# Patient Record
Sex: Male | Born: 1935
Health system: Southern US, Community
[De-identification: ages and names within clinical notes are randomized; demographics above are authoritative.]

## PROBLEM LIST (undated history)

## (undated) DIAGNOSIS — G4730 Sleep apnea, unspecified: Secondary | ICD-10-CM

## (undated) DIAGNOSIS — E785 Hyperlipidemia, unspecified: Secondary | ICD-10-CM

## (undated) DIAGNOSIS — N400 Enlarged prostate without lower urinary tract symptoms: Secondary | ICD-10-CM

## (undated) DIAGNOSIS — I10 Essential (primary) hypertension: Secondary | ICD-10-CM

## (undated) DIAGNOSIS — Z960 Presence of urogenital implants: Secondary | ICD-10-CM

## (undated) DIAGNOSIS — M5136 Other intervertebral disc degeneration, lumbar region: Secondary | ICD-10-CM

## (undated) DIAGNOSIS — Z978 Presence of other specified devices: Secondary | ICD-10-CM

## (undated) DIAGNOSIS — T884XXA Failed or difficult intubation, initial encounter: Secondary | ICD-10-CM

## (undated) DIAGNOSIS — K219 Gastro-esophageal reflux disease without esophagitis: Secondary | ICD-10-CM

## (undated) DIAGNOSIS — N281 Cyst of kidney, acquired: Secondary | ICD-10-CM

## (undated) DIAGNOSIS — F32A Depression, unspecified: Secondary | ICD-10-CM

## (undated) DIAGNOSIS — D509 Iron deficiency anemia, unspecified: Secondary | ICD-10-CM

## (undated) DIAGNOSIS — Z860100 Personal history of colon polyps, unspecified: Secondary | ICD-10-CM

## (undated) DIAGNOSIS — R7303 Prediabetes: Secondary | ICD-10-CM

## (undated) DIAGNOSIS — F329 Major depressive disorder, single episode, unspecified: Secondary | ICD-10-CM

## (undated) DIAGNOSIS — F419 Anxiety disorder, unspecified: Secondary | ICD-10-CM

## (undated) DIAGNOSIS — Z8719 Personal history of other diseases of the digestive system: Secondary | ICD-10-CM

---
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
## 2000-10-31 DIAGNOSIS — M51369 Other intervertebral disc degeneration, lumbar region without mention of lumbar back pain or lower extremity pain: Secondary | ICD-10-CM

## 2000-10-31 DIAGNOSIS — M5136 Other intervertebral disc degeneration, lumbar region: Secondary | ICD-10-CM

## 2000-10-31 HISTORY — DX: Other intervertebral disc degeneration, lumbar region: M51.36

## 2000-10-31 HISTORY — DX: Other intervertebral disc degeneration, lumbar region without mention of lumbar back pain or lower extremity pain: M51.369

## 2001-10-04 ENCOUNTER — Emergency Department (HOSPITAL_COMMUNITY): Admission: EM | Admit: 2001-10-04 | Discharge: 2001-10-04 | Payer: Self-pay | Admitting: Emergency Medicine

## 2001-10-04 ENCOUNTER — Encounter: Payer: Self-pay | Admitting: Emergency Medicine

## 2003-05-14 ENCOUNTER — Encounter: Payer: Self-pay | Admitting: Internal Medicine

## 2003-06-09 ENCOUNTER — Ambulatory Visit (HOSPITAL_COMMUNITY): Admission: RE | Admit: 2003-06-09 | Discharge: 2003-06-09 | Payer: Self-pay | Admitting: Gastroenterology

## 2003-06-09 ENCOUNTER — Encounter (INDEPENDENT_AMBULATORY_CARE_PROVIDER_SITE_OTHER): Payer: Self-pay | Admitting: Specialist

## 2004-10-13 ENCOUNTER — Ambulatory Visit: Payer: Self-pay | Admitting: Family Medicine

## 2004-11-02 ENCOUNTER — Ambulatory Visit: Payer: Self-pay | Admitting: Internal Medicine

## 2005-01-14 ENCOUNTER — Ambulatory Visit: Payer: Self-pay | Admitting: Internal Medicine

## 2005-01-21 ENCOUNTER — Ambulatory Visit: Payer: Self-pay | Admitting: Internal Medicine

## 2006-03-10 ENCOUNTER — Ambulatory Visit: Payer: Self-pay | Admitting: Internal Medicine

## 2006-03-20 ENCOUNTER — Ambulatory Visit: Payer: Self-pay | Admitting: Internal Medicine

## 2006-03-25 ENCOUNTER — Inpatient Hospital Stay (HOSPITAL_COMMUNITY): Admission: EM | Admit: 2006-03-25 | Discharge: 2006-03-25 | Payer: Self-pay | Admitting: Emergency Medicine

## 2006-03-25 ENCOUNTER — Ambulatory Visit: Payer: Self-pay | Admitting: *Deleted

## 2006-03-29 ENCOUNTER — Ambulatory Visit: Payer: Self-pay | Admitting: Cardiovascular Disease

## 2006-03-31 ENCOUNTER — Ambulatory Visit: Payer: Self-pay

## 2006-03-31 ENCOUNTER — Encounter: Payer: Self-pay | Admitting: Internal Medicine

## 2006-04-12 ENCOUNTER — Ambulatory Visit: Payer: Self-pay | Admitting: Internal Medicine

## 2006-04-19 ENCOUNTER — Ambulatory Visit: Payer: Self-pay | Admitting: Internal Medicine

## 2006-04-26 ENCOUNTER — Inpatient Hospital Stay (HOSPITAL_COMMUNITY): Admission: RE | Admit: 2006-04-26 | Discharge: 2006-04-26 | Payer: Self-pay | Admitting: Internal Medicine

## 2006-04-26 ENCOUNTER — Ambulatory Visit: Payer: Self-pay | Admitting: Internal Medicine

## 2006-06-19 ENCOUNTER — Ambulatory Visit: Payer: Self-pay | Admitting: Internal Medicine

## 2006-06-26 ENCOUNTER — Ambulatory Visit: Payer: Self-pay | Admitting: Internal Medicine

## 2006-08-16 ENCOUNTER — Ambulatory Visit (HOSPITAL_COMMUNITY): Admission: RE | Admit: 2006-08-16 | Discharge: 2006-08-16 | Payer: Self-pay | Admitting: Gastroenterology

## 2006-10-31 DIAGNOSIS — N281 Cyst of kidney, acquired: Secondary | ICD-10-CM

## 2006-10-31 HISTORY — DX: Cyst of kidney, acquired: N28.1

## 2007-01-22 ENCOUNTER — Ambulatory Visit: Payer: Self-pay | Admitting: Family Medicine

## 2007-03-07 ENCOUNTER — Ambulatory Visit: Payer: Self-pay | Admitting: Internal Medicine

## 2007-04-06 ENCOUNTER — Encounter: Admission: RE | Admit: 2007-04-06 | Discharge: 2007-04-06 | Payer: Self-pay | Admitting: Internal Medicine

## 2007-04-23 ENCOUNTER — Encounter: Admission: RE | Admit: 2007-04-23 | Discharge: 2007-06-05 | Payer: Self-pay | Admitting: Internal Medicine

## 2007-06-06 ENCOUNTER — Encounter (INDEPENDENT_AMBULATORY_CARE_PROVIDER_SITE_OTHER): Payer: Self-pay

## 2007-06-06 ENCOUNTER — Ambulatory Visit: Payer: Self-pay | Admitting: Internal Medicine

## 2007-06-06 DIAGNOSIS — M4800 Spinal stenosis, site unspecified: Secondary | ICD-10-CM | POA: Insufficient documentation

## 2007-07-18 ENCOUNTER — Encounter: Payer: Self-pay | Admitting: Internal Medicine

## 2007-07-19 ENCOUNTER — Telehealth (INDEPENDENT_AMBULATORY_CARE_PROVIDER_SITE_OTHER): Payer: Self-pay | Admitting: *Deleted

## 2007-07-25 ENCOUNTER — Encounter: Admission: RE | Admit: 2007-07-25 | Discharge: 2007-07-25 | Payer: Self-pay | Admitting: Internal Medicine

## 2008-02-20 ENCOUNTER — Ambulatory Visit: Payer: Self-pay | Admitting: Internal Medicine

## 2008-02-20 DIAGNOSIS — E785 Hyperlipidemia, unspecified: Secondary | ICD-10-CM | POA: Insufficient documentation

## 2008-02-20 DIAGNOSIS — N400 Enlarged prostate without lower urinary tract symptoms: Secondary | ICD-10-CM | POA: Insufficient documentation

## 2008-02-20 DIAGNOSIS — F528 Other sexual dysfunction not due to a substance or known physiological condition: Secondary | ICD-10-CM | POA: Insufficient documentation

## 2008-02-20 HISTORY — DX: Hyperlipidemia, unspecified: E78.5

## 2008-02-21 LAB — CONVERTED CEMR LAB
ALT: 22 units/L (ref 0–53)
AST: 24 units/L (ref 0–37)
Albumin: 3.7 g/dL (ref 3.5–5.2)
Alkaline Phosphatase: 41 units/L (ref 39–117)
BUN: 17 mg/dL (ref 6–23)
Basophils Absolute: 0 10*3/uL (ref 0.0–0.1)
Basophils Relative: 0 % (ref 0.0–1.0)
Bilirubin, Direct: 0.1 mg/dL (ref 0.0–0.3)
CO2: 28 meq/L (ref 19–32)
Calcium: 8.7 mg/dL (ref 8.4–10.5)
Chloride: 104 meq/L (ref 96–112)
Cholesterol: 194 mg/dL (ref 0–200)
Creatinine, Ser: 0.8 mg/dL (ref 0.4–1.5)
Eosinophils Absolute: 0.3 10*3/uL (ref 0.0–0.7)
Eosinophils Relative: 5.3 % — ABNORMAL HIGH (ref 0.0–5.0)
GFR calc Af Amer: 123 mL/min
GFR calc non Af Amer: 102 mL/min
Glucose, Bld: 89 mg/dL (ref 70–99)
HCT: 42 % (ref 39.0–52.0)
HDL: 71.4 mg/dL (ref >39.0)
Hemoglobin: 13.5 g/dL (ref 13.0–17.0)
LDL Cholesterol: 115 mg/dL — ABNORMAL HIGH (ref 0–99)
Lymphocytes Relative: 31.2 % (ref 12.0–46.0)
MCHC: 32.1 g/dL (ref 30.0–36.0)
MCV: 82.1 fL (ref 78.0–100.0)
Monocytes Absolute: 0.6 10*3/uL (ref 0.1–1.0)
Monocytes Relative: 13.4 % — ABNORMAL HIGH (ref 3.0–12.0)
Neutro Abs: 2.4 10*3/uL (ref 1.4–7.7)
Neutrophils Relative %: 50.1 % (ref 43.0–77.0)
PSA: 2.1 ng/mL (ref 0.10–4.00)
Platelets: 203 10*3/uL (ref 150–400)
Potassium: 4.4 meq/L (ref 3.5–5.1)
RBC: 5.11 M/uL (ref 4.22–5.81)
RDW: 13.5 % (ref 11.5–14.6)
Sodium: 139 meq/L (ref 135–145)
TSH: 1.18 microintl units/mL (ref 0.35–5.50)
Testosterone: 603.37 ng/dL (ref 350.00–890)
Total Bilirubin: 1 mg/dL (ref 0.3–1.2)
Total CHOL/HDL Ratio: 2.7
Total Protein: 6.4 g/dL (ref 6.0–8.3)
Triglycerides: 39 mg/dL (ref 0–149)
VLDL: 8 mg/dL (ref 0–40)
WBC: 4.8 10*3/uL (ref 4.5–10.5)

## 2008-08-12 ENCOUNTER — Encounter: Payer: Self-pay | Admitting: Internal Medicine

## 2009-03-06 ENCOUNTER — Ambulatory Visit: Payer: Self-pay | Admitting: Internal Medicine

## 2009-03-06 LAB — CONVERTED CEMR LAB
Albumin: 3.7 g/dL (ref 3.5–5.2)
BUN: 21 mg/dL (ref 6–23)
Basophils Absolute: 0 10*3/uL (ref 0.0–0.1)
Bilirubin Urine: NEGATIVE
Blood in Urine, dipstick: NEGATIVE
CO2: 28 meq/L (ref 19–32)
Calcium: 8.7 mg/dL (ref 8.4–10.5)
Cholesterol: 185 mg/dL (ref 0–200)
Eosinophils Absolute: 0.3 10*3/uL (ref 0.0–0.7)
Glucose, Bld: 90 mg/dL (ref 70–99)
Glucose, Urine, Semiquant: NEGATIVE
HCT: 39.5 % (ref 39.0–52.0)
HDL: 66 mg/dL (ref >39.00)
Hemoglobin: 13.3 g/dL (ref 13.0–17.0)
Lymphocytes Relative: 32.9 % (ref 12.0–46.0)
Lymphs Abs: 1.7 10*3/uL (ref 0.7–4.0)
MCHC: 33.7 g/dL (ref 30.0–36.0)
Neutro Abs: 2.6 10*3/uL (ref 1.4–7.7)
Nitrite: NEGATIVE
PSA: 2.04 ng/mL (ref 0.10–4.00)
Platelets: 198 10*3/uL (ref 150.0–400.0)
RDW: 13.6 % (ref 11.5–14.6)
Sodium: 141 meq/L (ref 135–145)
Specific Gravity, Urine: 1.02
TSH: 1.07 microintl units/mL (ref 0.35–5.50)
Total Bilirubin: 0.9 mg/dL (ref 0.3–1.2)
Urobilinogen, UA: 0.2
VLDL: 10.4 mg/dL (ref 0.0–40.0)
WBC Urine, dipstick: NEGATIVE
pH: 6

## 2009-03-17 ENCOUNTER — Ambulatory Visit: Payer: Self-pay | Admitting: Internal Medicine

## 2010-03-12 ENCOUNTER — Ambulatory Visit: Payer: Self-pay | Admitting: Internal Medicine

## 2010-03-12 LAB — CONVERTED CEMR LAB
ALT: 22 units/L (ref 0–53)
BUN: 17 mg/dL (ref 6–23)
Bilirubin Urine: NEGATIVE
CO2: 27 meq/L (ref 19–32)
Chloride: 104 meq/L (ref 96–112)
Cholesterol: 187 mg/dL (ref 0–200)
Eosinophils Relative: 7.4 % — ABNORMAL HIGH (ref 0.0–5.0)
Glucose, Bld: 89 mg/dL (ref 70–99)
Glucose, Urine, Semiquant: NEGATIVE
HCT: 39.6 % (ref 39.0–52.0)
LDL Cholesterol: 106 mg/dL — ABNORMAL HIGH (ref 0–99)
Lymphs Abs: 1.5 10*3/uL (ref 0.7–4.0)
MCHC: 33 g/dL (ref 30.0–36.0)
MCV: 81.6 fL (ref 78.0–100.0)
Monocytes Absolute: 0.6 10*3/uL (ref 0.1–1.0)
PSA: 2.15 ng/mL (ref 0.10–4.00)
Platelets: 200 10*3/uL (ref 150.0–400.0)
Potassium: 4.1 meq/L (ref 3.5–5.1)
Specific Gravity, Urine: 1.015
TSH: 2.04 microintl units/mL (ref 0.35–5.50)
Total Bilirubin: 0.8 mg/dL (ref 0.3–1.2)
Total Protein: 6.4 g/dL (ref 6.0–8.3)
WBC: 6 10*3/uL (ref 4.5–10.5)
pH: 6.5

## 2010-03-19 ENCOUNTER — Ambulatory Visit: Payer: Self-pay | Admitting: Internal Medicine

## 2010-06-07 ENCOUNTER — Ambulatory Visit: Payer: Self-pay | Admitting: Internal Medicine

## 2010-09-17 ENCOUNTER — Ambulatory Visit: Payer: Self-pay | Admitting: Internal Medicine

## 2010-09-17 DIAGNOSIS — F341 Dysthymic disorder: Secondary | ICD-10-CM | POA: Insufficient documentation

## 2010-10-31 DIAGNOSIS — Z8719 Personal history of other diseases of the digestive system: Secondary | ICD-10-CM

## 2010-10-31 HISTORY — DX: Personal history of other diseases of the digestive system: Z87.19

## 2010-11-30 NOTE — Assessment & Plan Note (Signed)
Summary: mole removal/njr   Vital Signs:  Patient profile:   75 year old male Pulse rate:   64 / minute Pulse rhythm:   regular Resp:     12 per minute BP sitting:   110 / 68  (left arm) Cuff size:   regular  Vitals Entered By: Gladis Riffle, RN (June 07, 2010 8:29 AM)  Procedure Note Last Tetanus: given (04/10/2003)  Incision & Drainage: Onset of lesion: 3 months Indication: enlarging cyst  Procedure # 1: I & D    Size (in cm): 0.8 x 0.8    Location: face    Comment: incision made into cyst  location--under left eye purulent material removed post-op care discussed keep clean use frequent warm compresses    Instrument used: #10 blade    Anesthesia: 1% lidocaine w/o epinephrine  CC: mole removal under left eye Is Patient Diabetic? No   CC:  mole removal under left eye.  Preventive Screening-Counseling & Management  Alcohol-Tobacco     Smoking Status: quit  Current Medications (verified): 1)  Hytrin 5 Mg Caps (Terazosin Hcl) .... Take 1 Capsule By Mouth Once A Day 2)  Citalopram Hydrobromide 20 Mg  Tabs (Citalopram Hydrobromide) .... Take 1 Tablet By Mouth Once A Day  Allergies (verified): No Known Drug Allergies   Complete Medication List: 1)  Hytrin 5 Mg Caps (Terazosin hcl) .... Take 1 capsule by mouth once a day 2)  Citalopram Hydrobromide 20 Mg Tabs (Citalopram hydrobromide) .... Take 1 tablet by mouth once a day  Other Orders: I&D Abscess, Simple / Single (10060)

## 2010-11-30 NOTE — Assessment & Plan Note (Signed)
Summary: CPX/NJR   Vital Signs:  Patient profile:   75 year old male Height:      66.75 inches Weight:      143 pounds BMI:     22.65 Temp:     97.6 degrees F oral Pulse rate:   64 / minute Pulse rhythm:   regular BP sitting:   102 / 70  (left arm) Cuff size:   regular  Vitals Entered By: Kern Reap CMA Duncan Dull) (Mar 19, 2010 9:21 AM) CC: cpx   CC:  cpx.  Current Problems (verified): 1)  Erectile Dysfunction  (ICD-302.72) 2)  Preventive Health Care  (ICD-V70.0) 3)  Benign Prostatic Hypertrophy  (ICD-600.00) 4)  Hyperlipidemia  (ICD-272.4) 5)  Spinal Stenosis  (ICD-724.00)  Current Medications (verified): 1)  Hytrin 5 Mg Caps (Terazosin Hcl) .... Take 1 Capsule By Mouth Once A Day 2)  Citalopram Hydrobromide 20 Mg  Tabs (Citalopram Hydrobromide) .... Take 1 Tablet By Mouth Once A Day  Allergies: No Known Drug Allergies  Past History:  Past Medical History: Last updated: 02/20/2008 Hyperlipidemia Benign prostatic hypertrophy mood disorder renal cyst---2008 ultrasound  Past Surgical History: Last updated: 02/20/2008 Denies surgical history  Family History: Last updated: 02/20/2008 mother dementia father deceased MI 62 yo 2 sibs with htn Family History Hypertension  Social History: Last updated: 02/20/2008 Married Former Smoker Alcohol use-yes Retired  Risk Factors: Smoking Status: quit (02/20/2008)  Physical Exam  General:  alert and well-developed.   Head:  normocephalic and atraumatic.   Eyes:  pupils equal and pupils round.   Ears:  R ear normal and L ear normal.   Nose:  no external deformity and no external erythema.   Neck:  No deformities, masses, or tenderness noted. Chest Wall:  No deformities, masses, tenderness or gynecomastia noted. Lungs:  normal respiratory effort and no accessory muscle use.   Heart:  normal rate and regular rhythm.   Abdomen:  Bowel sounds positive,abdomen soft and non-tender without masses, organomegaly or  hernias noted. Rectal:  no external abnormalities and normal sphincter tone.   Prostate:  no nodules, no asymmetry, and 4+ enlarged.   Msk:  No deformity or scoliosis noted of thoracic or lumbar spine.   Pulses:  R radial normal and L radial normal.   Neurologic:  cranial nerves II-XII intact and gait normal.     Impression & Recommendations:  Problem # 1:  PREVENTIVE HEALTH CARE (ICD-V70.0) health maint UTD continue excellent helath habits  Problem # 2:  BENIGN PROSTATIC HYPERTROPHY (ICD-600.00) sxs are controlled and stable His updated medication list for this problem includes:    Hytrin 5 Mg Caps (Terazosin hcl) .Marland Kitchen... Take 1 capsule by mouth once a day  Complete Medication List: 1)  Hytrin 5 Mg Caps (Terazosin hcl) .... Take 1 capsule by mouth once a day 2)  Citalopram Hydrobromide 20 Mg Tabs (Citalopram hydrobromide) .... Take 1 tablet by mouth once a day  Patient Instructions: 1)  Please schedule a follow-up appointment in 6 months. 2)  make a second appt for mole removal---liquid nitrogen Prescriptions: CITALOPRAM HYDROBROMIDE 20 MG  TABS (CITALOPRAM HYDROBROMIDE) Take 1 tablet by mouth once a day  #90 x 3   Entered and Authorized by:   Birdie Sons MD   Signed by:   Birdie Sons MD on 03/19/2010   Method used:   Electronically to        [facility]. [phone]* (retail)       [phone]  [place], [place]  [address]       Ph: [phone]       Fax: [phone]   RxID:   [id number] HYTRIN 5 MG CAPS (TERAZOSIN HCL) Take 1 capsule by mouth once a day  #90 x 3   Entered and Authorized by:   Birdie Sons MD   Signed by:   Birdie Sons MD on 03/19/2010   Method used:   Electronically to        [facility]. [phone]* (retail)       [phone]       [place], [place]  [address]       Ph: [phone]       Fax: [phone]   RxID:   [id number]    Immunization  History:  Influenza Immunization History:    Influenza:  historical (07/31/2009)

## 2010-11-30 NOTE — Assessment & Plan Note (Signed)
Summary: 6 month rov/njr [facility] DUE TO ERROR/NJR   Vital Signs:  Patient profile:   75 year old male Weight:      148 pounds Temp:     98.0 degrees F oral Pulse rate:   76 / minute BP sitting:   128 / 64  (left arm) Cuff size:   regular  Vitals Entered By: Alfred Levins, CMA (September 17, 2010 8:55 AM) CC: f/u, renew meds   CC:  f/u and renew meds.  History of Present Illness: patient feels well. Has no complaints. He is here for followup on benign prostatic hypertrophy. He essentially has no complaints. He has nocturia x1.  Mood disorder. He is doing well on citalopram  He had one episode of muscle spasm in his back while he was in [place].  All other systems reviewed and were negative   Current Medications (verified): 1)  Hytrin 5 Mg Caps (Terazosin Hcl) .... Take 1 Capsule By Mouth Once A Day 2)  Citalopram Hydrobromide 20 Mg  Tabs (Citalopram Hydrobromide) .... Take 1 Tablet By Mouth Once A Day  Allergies (verified): No Known Drug Allergies  Past History:  Past Medical History: Last updated: 02/20/2008 Hyperlipidemia Benign prostatic hypertrophy mood disorder renal cyst---2008 ultrasound  Past Surgical History: Last updated: 02/20/2008 Denies surgical history  Family History: Last updated: 02/20/2008 mother dementia father deceased MI 4 yo 2 sibs with htn Family History Hypertension  Social History: Last updated: 02/20/2008 Married Former Smoker Alcohol use-yes Retired  Risk Factors: Smoking Status: quit (06/07/2010)  Physical Exam  General:  well-developed well-nourished male in no acute distress. HEENT exam atraumatic, normocephalic neck supple. Chest clear to auscultation cardiac exam S1-S2 are regular. Extremities no clubbing cyanosis or edema.   Impression & Recommendations:  Problem # 1:  BENIGN PROSTATIC HYPERTROPHY (ICD-600.00)  His updated medication list for this problem includes:    Hytrin 5 Mg Caps (Terazosin hcl) .Marland Kitchen... Take 1  capsule by mouth once a day  Problem # 2:  HYPERLIPIDEMIA (ICD-272.4) no need for f/u will remove problem from list Labs Reviewed: SGOT: 23 (03/12/2010)   SGPT: 22 (03/12/2010)   HDL:69.70 (03/12/2010), 66.00 (03/06/2009)  LDL:106 (03/12/2010), 109 (03/06/2009)  Chol:187 (03/12/2010), 185 (03/06/2009)  Trig:55.0 (03/12/2010), 52.0 (03/06/2009)  Problem # 3:  DYSTHYMIA (ICD-300.4)  continue citalopram  Complete Medication List: 1)  Hytrin 5 Mg Caps (Terazosin hcl) .... Take 1 capsule by mouth once a day 2)  Citalopram Hydrobromide 20 Mg Tabs (Citalopram hydrobromide) .... Take 1 tablet by mouth once a day  Patient Instructions: 1)  6 months Prescriptions: CITALOPRAM HYDROBROMIDE 20 MG  TABS (CITALOPRAM HYDROBROMIDE) Take 1 tablet by mouth once a day  #90 x 3   Entered and Authorized by:   Birdie Sons MD   Signed by:   Birdie Sons MD on 09/17/2010   Method used:   Electronically to        [facility]. [phone]* (retail)       [phone]       [place], [place]  [address]       Ph: [phone]       Fax: [phone]   RxID:   [id number] HYTRIN 5 MG CAPS (TERAZOSIN HCL) Take 1 capsule by mouth once a day  #90 x 3   Entered and Authorized by:   Birdie Sons MD   Signed by:   Birdie Sons MD on 09/17/2010   Method used:  Electronically to        [facility]. [phone]* (retail)       [phone]       [place], [place]  [address]       Ph: [phone]       Fax: [phone]   RxID:   [id number]    Orders Added: 1)  Est. Patient Level III [40347]

## 2010-12-27 ENCOUNTER — Ambulatory Visit (INDEPENDENT_AMBULATORY_CARE_PROVIDER_SITE_OTHER): Payer: 59 | Admitting: Internal Medicine

## 2010-12-27 ENCOUNTER — Encounter: Payer: Self-pay | Admitting: Internal Medicine

## 2010-12-27 DIAGNOSIS — R21 Rash and other nonspecific skin eruption: Secondary | ICD-10-CM | POA: Insufficient documentation

## 2010-12-27 MED ORDER — METHYLPREDNISOLONE (PAK) 4 MG PO TABS: ORAL_TABLET | ORAL | Status: AC

## 2010-12-27 NOTE — Progress Notes (Signed)
  Subjective:    Patient ID: [name], male    DOB: 10/27/1936, 75 y.o.   MRN: [id number]  HPI  Pt presents to clinic for evaluation of rash. Notes ~2 wk h/o intermittent diffuse itching followed by ~ 1 wk duration of erythematous maculopapular rash located lower abdomen, legs and primarily back. States itching is mild and denies associated pain. Does not appear to be spreading/progressing. The last two days has attempted hydrocortisone 2.5% with mild improvement of rash. No oral involvement.  Unaware of any recent chemical exposures however with recent trip to [place] laundry detergent may have been change. No other alleviating or exacerbating factors.  Reviewed PMH, medications and allergies.    Review of Systems  Constitutional: Negative for fever and chills.  Musculoskeletal: Negative for back pain.  Skin: Positive for rash. Negative for pallor and wound.       Objective:   Physical Exam  Constitutional: He appears well-developed and well-nourished. No distress.  HENT:  Head: Normocephalic and atraumatic.  Right Ear: External ear normal.  Left Ear: External ear normal.  Eyes: Conjunctivae are normal. No scleral icterus.  Neurological: He is alert.  Skin: Skin is warm and dry. Rash noted. No abrasion, no bruising, no burn, no ecchymosis, no laceration, no lesion, no petechiae and no purpura noted. Rash is maculopapular. Rash is not macular, not papular, not nodular, not pustular, not vesicular and not urticarial. He is not diaphoretic. No erythema. No pallor.          Areas of rash involvement noted graphically. Rash appears as erythematous MP without confluence, blistering or dermatomal distribution.  Psychiatric: He has a normal mood and affect.          Assessment & Plan:

## 2010-12-27 NOTE — Assessment & Plan Note (Signed)
Suggestive of dermatitis. Given diffuse locations will begin medrol dosepak (has tolerated steroids before without difficulty). Followup if no improvement or worsening.

## 2011-02-01 ENCOUNTER — Other Ambulatory Visit: Payer: Self-pay | Admitting: Gastroenterology

## 2011-02-04 ENCOUNTER — Ambulatory Visit
Admission: RE | Admit: 2011-02-04 | Discharge: 2011-02-04 | Disposition: A | Discharge status: Home / Self Care | Payer: 59 | Source: Ambulatory Visit | Attending: Gastroenterology | Admitting: Gastroenterology

## 2011-03-11 ENCOUNTER — Ambulatory Visit: Payer: Self-pay | Admitting: Internal Medicine

## 2011-03-11 DIAGNOSIS — Z0289 Encounter for other administrative examinations: Secondary | ICD-10-CM

## 2011-03-18 NOTE — Op Note (Signed)
   NAME:  BRANDOL, CORP                          ACCOUNT NO.:  [id number]   MEDICAL RECORD NO.:  [id number]                   PATIENT TYPE:  AMB   LOCATION:  ENDO                                 FACILITY:  [facility]   PHYSICIAN:  Anselmo Rod, M.D.               DATE OF BIRTH:  August 10, 1936   DATE OF PROCEDURE:  06/09/2003  DATE OF DISCHARGE:                                 OPERATIVE REPORT   PROCEDURE:  Colonoscopy with snare polypectomy x1.   ENDOSCOPIST:  Anselmo Rod, M.D.   INSTRUMENT USED:  Olympus video colonoscope.   INDICATIONS FOR PROCEDURE:  Screening colonoscopy being done in a 75-year-  old male, rule out colonic polyps, masses, etc.   PREPROCEDURE PREPARATION:  Informed consent was procured from the patient.  The patient was fasted for eight hours prior to the procedure.  Prepped with  a bottle of magnesium citrate and a gallon of GoLYTELY the night prior to  the procedure.   PREPROCEDURE PHYSICAL EXAMINATION:  VITAL SIGNS: Stable.  NECK:  Supple.  CHEST: Clear to auscultation. S1 and S2 regular. ABDOMEN:  Soft with normal  bowel sounds.   DESCRIPTION OF PROCEDURE:  The patient was placed in the left lateral  decubitus position and sedated with 50 mg of Demerol and 6 mg of Versed  intravenously. Once the patient was adequately sedated and maintained on low  flow oxygen and continuous cardiac monitoring, the Olympus video colonoscope  was advanced from the rectum to the cecum without difficulty.  The patient  had a fairly good prep. A small sessile polyp was snared from the hepatic  flexure.  Small internal hemorrhoids were seen on retroflexion in the  rectum. No other masses, polyps, erosions, ulceration, or diverticula were  present.  Appendiceal orifice and ileocecal valve were clearly visualized  and photographed.   IMPRESSION:  1. Small sessile polyp snared from the hepatic flexure.  2. Small nonbleeding internal hemorrhoids.  3. Normal appearing left  colon, transverse colon, and right colon including     cecum.   RECOMMENDATIONS:  1. Await pathology results.  2.     Avoid nonsteroidals including aspirin for the next two weeks.  3. Repeat colorectal cancer screening depending on pathology results.  4. High fiber diet with liberal fluid intake.                                               Anselmo Rod, M.D.    JNM/MEDQ  D:  06/09/2003  T:  06/09/2003  Job:  [id number]   cc:   Valetta Mole. Swords, M.D. [facility]

## 2011-03-18 NOTE — Discharge Summary (Signed)
[name], [name] NO.:  [id number]   MEDICAL RECORD NO.:  [id number]          PATIENT TYPE:  INP   LOCATION:  2899                         FACILITY:  [facility]   PHYSICIAN:  Maple Mirza, P.A. DATE OF BIRTH:  November 04, 1935   DATE OF ADMISSION:  04/26/2006  DATE OF DISCHARGE:  04/26/2006                                 DISCHARGE SUMMARY   ALLERGIES:  HE HAS NO KNOWN DRUG ALLERGIES, BUT ALLERGY TO SHELLFISH.   PRINCIPAL DIAGNOSES:  1.  Syncope in context of Brugada type 1 electrocardiogram tracing.  2.  Family history of sudden death.  3.  Stress Myoview study, March 31, 2006, normal left ventricular function.      No ischemia.  4.  Electrophysiology study, April 26, 2006, no inducible ventricular      arrhythmias.   SECONDARY DIAGNOSES:  1.  Depression.  2.  Benign prostatic hypertrophy.   PROCEDURE:  April 26, 2006, Electrophysiology study:  No inducible  ventricular arrhythmias, Dr. Sherryl Manges.  This patient will not need  follow up with [facility].   BRIEF HISTORY:  [name] is a 75 year old male.  He has no known history  of coronary artery disease and was seen in the emergency room at the end of  May.  He had a complaint of nausea, vomiting, and syncope.  This happened at  the time that the patient had been at a party.  He had three alcoholic  beverages and had not eaten in quite sometime.  He did not feel inebriated,  however, on leaving the party he felt unwell and his wife had actually  helped him to car.  He remembers nothing after this.   His wife notes that he had a very altered state of consciousness.  She was  able to get him move, with some difficulty, from the car to the bedroom when  he got home.  At times at night, he would be more conscious and lucid then  others.  There was never a complete loss of consciousness.  Dr. Sylvie Farrier is a  friend and neighbor.  He checked the patient's blood pressure, it was 80/45.  The pulse was normal  and regular.  The patient was brought to the emergency  room diaphoretic and pale.  Electrocardiogram demonstrated some concern for  Brugada syndrome and he is referred for an appointment on May 30th with the  cardiologist here at [facility].  At this point, he has seen Dr.  Graciela Husbands.  Dr. Graciela Husbands had a long discussion with the patient and wife regarding  type 1 Brugada.   The patient would be for the patient to have a stress Myoview study and  further testing such as electrophysiology study testing.  There is some  literature and study which show that syncope in the setting of Brugada  electrocardiogram benefits from stress stratifying electrophysiology study.  If the study is positive for ventricular arrhythmias inducible then  implantation of a defibrillator is indicated.  The risks and benefits of the  procedure as well as the details of implantable cardioverter-defibrillator  implantation described to the patient.  Family screening was also discussed  at length and the results of the Myoview which were negative for ischemia,  with ejection fraction of well-preserved were related to the patient.  Patient will present electively June 27th for electrophysiology study.   HOSPITAL COURSE:  Ventricular arrhythmias were not inducible.  The patient  will be discharged home on his prehospitalization medications which include,  Lexapro 10 mg daily and Hytrin 5 mg daily.  There is no follow up with  [facility].   This is a 15 minute preparation.      Maple Mirza, P.A.     GM/MEDQ  D:  04/26/2006  T:  04/26/2006  Job:  [id number]   cc:   Duke Salvia, M.D.  [address]  [address]  [place]  [place] [address]   Valetta Mole. Swords, M.D. [facility]  [address] [place]  [place] [address]   Heide Guile, MD  Fax: [phone]

## 2011-03-18 NOTE — Op Note (Signed)
[name], FRIESEN NO.:  [id number]   MEDICAL RECORD NO.:  [id number]          PATIENT TYPE:  AMB   LOCATION:  ENDO                         FACILITY:  [facility]   PHYSICIAN:  Anselmo Rod, M.D.  DATE OF BIRTH:  [birth year]   DATE OF PROCEDURE:  08/16/2006  DATE OF DISCHARGE:  08/16/2006                                 OPERATIVE REPORT   PROCEDURE:  Screening colonoscopy.   ENDOSCOPIST:  Anselmo Rod, M.D.   INSTRUMENT:  Olympus videocolonoscope.   INDICATIONS FOR PROCEDURE:  A 75 year old [place] male with a history of  guaiac-positive stools and a personal history of adenomatous polyps removed  in the past, undergoing a repeat surveillance colonoscopy to rule out  colonic polyps, masses, etcetera.   PREPROCEDURE PREPARATION:  Informed consent was procured from the patient.  The patient was fasted for 4 hours prior to the procedure and prepped with  20 OsmoPrep pills the night of, and 12 OsmoPrep pills the morning of the  procedure. The risks and benefits of the procedure including a 10% missed  rate of cancer and polyps were discussed with the patient as well.   PREPROCEDURE PHYSICAL:  VITAL SIGNS: The patient with stable vital signs.  NECK:  Supple.  CHEST:  Clear to auscultation.  HEART:  S1, S2 regular.  ABDOMEN:  Soft with normal bowel sounds.   DESCRIPTION OF PROCEDURE:  The patient was placed in the left lateral  decubitus position and sedated with 50 mcg of Fentanyl and 5 mg of Versed  given intravenously in slow incremental doses. Once the patient was  adequately sedated, and maintained on low-flow oxygen, and continuous  cardiac monitoring; the Olympus videocolonoscope was advanced from the  rectum to the cecum.  The appendiceal orifice and ileocecal valve were  clearly visualized and photographed.  The terminal ileum appeared healthy  without lesions.  No masses, polyps, erosions, ulcerations, or diverticula  were seen.  Retroflexion in the  rectum revealed no abnormalities.  The  patient tolerated the procedure well without immediate complications.   IMPRESSION:  1. Normal colonoscopy up to the terminal ileum.  2. No masses, polyps, or diverticula seen.   RECOMMENDATIONS:  1. Repeat guaiacs will be done on an outpatient basis.  2. Repeat colonoscopy in the next 5 years unless the patient develops      abnormal symptoms in which case he should contact the office      immediately for further recommendations.  3. Outpatient follow up as need arises in the future.      Anselmo Rod, M.D.  Electronically Signed     JNM/MEDQ  D:  08/16/2006  T:  08/18/2006  Job:  [id number]   cc:   Valetta Mole. Swords, MD

## 2011-03-18 NOTE — Op Note (Signed)
[name], BOLIO NO.:  [id number]   MEDICAL RECORD NO.:  [id number]          PATIENT TYPE:  INP   LOCATION:  2899                         FACILITY:  [facility]   PHYSICIAN:  Duke Salvia, M.D.  DATE OF BIRTH:  10/28/1936   DATE OF PROCEDURE:  04/26/2006  DATE OF DISCHARGE:                                 OPERATIVE REPORT   PREOPERATIVE DIAGNOSIS:  Type 1 Brugada electrocardiogram.   POSTOPERATIVE DIAGNOSIS:  Type 1 Brugada electrocardiogram.   PROCEDURE:  Invasive electrophysiological study.   Following obtaining informed consent, the patient was brought to the  electrophysiological laboratory and placed on the fluoroscopic table in the  supine position.  After routine prep and drape, cardiac catheterization was  performed with local anesthesia and conscious sedation.  Noninvasive blood  pressure monitoring and transcutaneous oxygenation and saturation monitoring  were performed continuously throughout the procedure.  Following the  procedure, the catheters were removed.  Hemostasis was obtained and the  patient was transferred to the holding area in stable condition.   CATHETER:  A 5-French Quadripolar catheter was inserted in the left femoral  vein to the left ventricular apex and subsequently moved to the high right  atrium.  A 5-French Quadripolar catheter was inserted in the left femoral vein to the  AV junction to measure the His electrogram.   Surface leads 1, aVF and V1 were monitored continuously throughout the  procedure.  Following insertion of the catheter, the stimulation protocol  included:  Incremental atrial pacing.  Incremental ventricular pacing.  Single and double and triple ventricular extra stimuli with a paced cycle  length of 600, 500 and 400 milliseconds.  Single and double atrial extra stimuli with a paced cycle length of 600:350  milliseconds.   RESULTS:  Surface electrocardiogram and intracardiac intervals:  Rhythm:   Sinus; RR interval: 10:34 milliseconds; P/R interval:  143  milliseconds; QRS duration 84 milliseconds; QT interval 443 milliseconds; P-  wave duration 115 milliseconds; pre-excitation: Absent; bundle branch block:  Absent.AH Interval:  132 milliseconds;  AH interval 48 milliseconds.  AV nodal physiology:  Dual antegrade AV nodal physiology was identified with  both discontinuous  conduction as well as P/R intervals long than RR  intervals.  Echo beats however were not seen.  Isoproterenol was not  infused.  The effective refractor period at 600 milliseconds was 350:240.   The VA conduction was dissociated at 600 milliseconds.   VENTRICULAR RESPONSE TO PROGRAM STIMULATION:  The effective refractory period had a pace cycle length of 600 milliseconds  and left ventricular apex was 270 milliseconds.  At 500 milliseconds it was  260 milliseconds.  At 400 milliseconds was 230 milliseconds.   Program ventricular stimulation was taken to refractoriness with minimal  coupling interval of 200 milliseconds for S3 and S4.  There were no  inducible ventricular arrhythmias; occasional isolated extra beats were  seen.   IMPRESSION:  1.  Sinus bradycardia.  2.  Normal atrial function.  3.  Dual antegrade AV nodal physiology without inducible echo beats.  4.  Normal  __________  function.  5.  No accessory pathway.  6.  Normal ventricular response to program stimulation.   SUMMARY AND CONCLUSION:  The results of the electrophysiological testing  would stratify Mr. [name] to low-risk not withstanding his spontaneous type  1 Brugada electrocardiogram.  Notwithstanding the patient's history of  syncope, after discussion with Dr. Sudie Bailey, it was felt this was not a  syncope that would be considered symptomatic as it was unlikely to be  arrhythmic given the prolonged prodrome and the prolonged nature of the  event.  Hence, he was ratified  on the basis of being an asymptomatic  patient and at  this point no further interventions are necessary.           ______________________________  Duke Salvia, M.D.     SCK/MEDQ  D:  04/26/2006  T:  04/26/2006  Job:  [id number]   cc:   Electrophysiology Laboratory

## 2011-08-01 HISTORY — PX: COLONOSCOPY: SHX174

## 2011-08-01 HISTORY — PX: UPPER GASTROINTESTINAL ENDOSCOPY: SHX188

## 2011-09-26 ENCOUNTER — Other Ambulatory Visit: Payer: Self-pay | Admitting: Internal Medicine

## 2011-09-27 ENCOUNTER — Other Ambulatory Visit: Payer: Self-pay | Admitting: Internal Medicine

## 2011-09-27 DIAGNOSIS — G4730 Sleep apnea, unspecified: Secondary | ICD-10-CM

## 2011-10-20 ENCOUNTER — Encounter: Payer: Self-pay | Admitting: Internal Medicine

## 2011-10-20 ENCOUNTER — Ambulatory Visit (INDEPENDENT_AMBULATORY_CARE_PROVIDER_SITE_OTHER): Payer: 59 | Admitting: Internal Medicine

## 2011-10-20 VITALS — BP 102/68 | HR 76 | Temp 97.8°F | Ht 68.0 in | Wt 145.0 lb

## 2011-10-20 DIAGNOSIS — Z0000 Encounter for general adult medical examination without abnormal findings: Secondary | ICD-10-CM

## 2011-10-20 DIAGNOSIS — K219 Gastro-esophageal reflux disease without esophagitis: Secondary | ICD-10-CM

## 2011-10-20 DIAGNOSIS — Z79899 Other long term (current) drug therapy: Secondary | ICD-10-CM

## 2011-10-20 LAB — CBC WITH DIFFERENTIAL/PLATELET
Basophils Absolute: 0 10*3/uL (ref 0.0–0.1)
Eosinophils Absolute: 0.3 10*3/uL (ref 0.0–0.7)
Eosinophils Relative: 6 % — ABNORMAL HIGH (ref 0.0–5.0)
HCT: 38.7 % — ABNORMAL LOW (ref 39.0–52.0)
Lymphs Abs: 1.5 10*3/uL (ref 0.7–4.0)
MCHC: 32.7 g/dL (ref 30.0–36.0)
MCV: 81.7 fl (ref 78.0–100.0)
Monocytes Absolute: 0.7 10*3/uL (ref 0.1–1.0)
Platelets: 206 10*3/uL (ref 150.0–400.0)
RDW: 14.2 % (ref 11.5–14.6)

## 2011-10-20 LAB — BASIC METABOLIC PANEL
CO2: 26 mEq/L (ref 19–32)
Chloride: 105 mEq/L (ref 96–112)
Potassium: 4 mEq/L (ref 3.5–5.1)

## 2011-10-20 LAB — HEPATIC FUNCTION PANEL
ALT: 20 U/L (ref 0–53)
AST: 19 U/L (ref 0–37)
Alkaline Phosphatase: 52 U/L (ref 39–117)
Bilirubin, Direct: 0.1 mg/dL (ref 0.0–0.3)
Total Bilirubin: 0.7 mg/dL (ref 0.3–1.2)

## 2011-10-20 LAB — LIPID PANEL
Cholesterol: 189 mg/dL (ref 0–200)
LDL Cholesterol: 111 mg/dL — ABNORMAL HIGH (ref 0–99)

## 2011-10-20 NOTE — Progress Notes (Signed)
Patient ID: [name], male   DOB: 11/15/1935, 75 y.o.   MRN: [id number] cpx  No past medical history on file.  History   Social History  . Marital Status: Married    Spouse Name: N/A    Number of Children: N/A  . Years of Education: N/A   Occupational History  . Not on file.   Social History Main Topics  . Smoking status: Former Games developer  . Smokeless tobacco: Not on file  . Alcohol Use: Not on file  . Drug Use: Not on file  . Sexually Active: Not on file   Other Topics Concern  . Not on file   Social History Narrative  . No narrative on file    No past surgical history on file.  No family history on file.  No Known Allergies  Current Outpatient Prescriptions on File Prior to Visit  Medication Sig Dispense Refill  . aspirin 81 MG chewable tablet Chew 81 mg by mouth daily.        . citalopram (CELEXA) 20 MG tablet take 1 tablet by mouth once daily  30 tablet  0  . terazosin (HYTRIN) 5 MG capsule take 1 capsule by mouth once daily  30 capsule  0     patient denies chest pain, shortness of breath, orthopnea. Denies lower extremity edema, abdominal pain, change in appetite, change in bowel movements. Patient denies rashes, musculoskeletal complaints. No other specific complaints in a complete review of systems.   BP 102/68  Pulse 76  Temp(Src) 97.8 F (36.6 C) (Oral)  Ht 5\' 8"  (1.727 m)  Wt 145 lb (65.772 kg)  BMI 22.05 kg/m2 Well-developed male in no acute distress. HEENT exam atraumatic, normocephalic, extraocular muscles are intact. Conjunctivae are pink without exudate. Neck is supple without lymphadenopathy, thyromegaly, jugular venous distention. Chest is clear to auscultation without increased work of breathing. Cardiac exam S1-S2 are regular. The PMI is normal. No significant murmurs or gallops. Abdominal exam active bowel sounds, soft, nontender. No abdominal bruits. Extremities no clubbing cyanosis or edema. Peripheral pulses are normal without bruits.  Neurologic exam alert and oriented without any motor or sensory deficits.   A/P--well visit, health maintenance UTD

## 2011-10-20 NOTE — Patient Instructions (Signed)
Call your insurance company and see if they will cover shingles vaccine. If they will, call us and we will give it to you  Call [facility]NT or Dr. Bonnielee Haff septum

## 2011-10-21 LAB — VITAMIN B12: Vitamin B-12: 163 pg/mL — ABNORMAL LOW (ref 211–911)

## 2011-10-21 LAB — IRON: Iron: 95 ug/dL (ref 42–165)

## 2011-10-21 LAB — FERRITIN: Ferritin: 23.7 ng/mL (ref 22.0–322.0)

## 2011-10-27 ENCOUNTER — Other Ambulatory Visit: Payer: Self-pay | Admitting: Internal Medicine

## 2011-11-10 ENCOUNTER — Other Ambulatory Visit: Payer: Self-pay | Admitting: Internal Medicine

## 2011-11-16 ENCOUNTER — Ambulatory Visit (INDEPENDENT_AMBULATORY_CARE_PROVIDER_SITE_OTHER): Payer: 59 | Admitting: Internal Medicine

## 2011-11-16 DIAGNOSIS — E538 Deficiency of other specified B group vitamins: Secondary | ICD-10-CM

## 2011-11-16 MED ORDER — CYANOCOBALAMIN 1000 MCG/ML IJ SOLN
1000.0000 ug | Freq: Once | INTRAMUSCULAR | Status: AC
  Administered 2011-11-16: 1000 ug via INTRAMUSCULAR

## 2011-11-22 ENCOUNTER — Ambulatory Visit (INDEPENDENT_AMBULATORY_CARE_PROVIDER_SITE_OTHER): Payer: 59 | Admitting: Internal Medicine

## 2011-11-22 DIAGNOSIS — E538 Deficiency of other specified B group vitamins: Secondary | ICD-10-CM

## 2011-11-22 DIAGNOSIS — J342 Deviated nasal septum: Secondary | ICD-10-CM

## 2011-11-22 MED ORDER — CYANOCOBALAMIN 1000 MCG/ML IJ SOLN
1000.0000 ug | Freq: Once | INTRAMUSCULAR | Status: AC
  Administered 2011-11-22: 1000 ug via INTRAMUSCULAR

## 2011-11-29 ENCOUNTER — Other Ambulatory Visit: Payer: Self-pay | Admitting: Internal Medicine

## 2011-11-30 ENCOUNTER — Ambulatory Visit (INDEPENDENT_AMBULATORY_CARE_PROVIDER_SITE_OTHER): Payer: 59 | Admitting: Internal Medicine

## 2011-11-30 DIAGNOSIS — E538 Deficiency of other specified B group vitamins: Secondary | ICD-10-CM

## 2011-11-30 MED ORDER — CYANOCOBALAMIN 1000 MCG/ML IJ SOLN
1000.0000 ug | Freq: Once | INTRAMUSCULAR | Status: AC
  Administered 2011-11-30: 1000 ug via INTRAMUSCULAR

## 2011-11-30 MED ORDER — ZOSTER VACCINE LIVE 19400 UNT/0.65ML ~~LOC~~ SOLR: 0.6500 mL | Freq: Once | SUBCUTANEOUS | Status: AC

## 2011-12-12 ENCOUNTER — Ambulatory Visit (HOSPITAL_BASED_OUTPATIENT_CLINIC_OR_DEPARTMENT_OTHER): Admission: RE | Admit: 2011-12-12 | Payer: Medicare Other | Source: Ambulatory Visit | Admitting: Otolaryngology

## 2011-12-12 ENCOUNTER — Encounter (HOSPITAL_BASED_OUTPATIENT_CLINIC_OR_DEPARTMENT_OTHER): Admission: RE | Payer: Self-pay | Source: Ambulatory Visit

## 2011-12-12 ENCOUNTER — Other Ambulatory Visit: Payer: Self-pay | Admitting: *Deleted

## 2011-12-12 SURGERY — SEPTOPLASTY, NOSE, WITH NASAL TURBINATE REDUCTION
Anesthesia: General

## 2011-12-12 NOTE — Telephone Encounter (Signed)
Updated immunizations

## 2011-12-28 ENCOUNTER — Ambulatory Visit: Payer: 59 | Admitting: Internal Medicine

## 2011-12-28 ENCOUNTER — Ambulatory Visit (INDEPENDENT_AMBULATORY_CARE_PROVIDER_SITE_OTHER): Payer: Medicare Other | Admitting: Internal Medicine

## 2011-12-28 DIAGNOSIS — E538 Deficiency of other specified B group vitamins: Secondary | ICD-10-CM

## 2011-12-28 MED ORDER — CYANOCOBALAMIN 1000 MCG/ML IJ SOLN
1000.0000 ug | Freq: Once | INTRAMUSCULAR | Status: AC
  Administered 2011-12-28: 1000 ug via INTRAMUSCULAR

## 2011-12-30 ENCOUNTER — Other Ambulatory Visit: Payer: Self-pay | Admitting: Internal Medicine

## 2012-01-25 ENCOUNTER — Ambulatory Visit: Payer: Medicare Other | Admitting: Internal Medicine

## 2012-01-30 ENCOUNTER — Other Ambulatory Visit: Payer: Self-pay | Admitting: Internal Medicine

## 2012-02-22 ENCOUNTER — Ambulatory Visit: Payer: Medicare Other | Admitting: *Deleted

## 2012-02-24 ENCOUNTER — Ambulatory Visit (INDEPENDENT_AMBULATORY_CARE_PROVIDER_SITE_OTHER): Payer: Medicare Other | Admitting: *Deleted

## 2012-02-24 DIAGNOSIS — E538 Deficiency of other specified B group vitamins: Secondary | ICD-10-CM

## 2012-02-24 MED ORDER — CYANOCOBALAMIN 1000 MCG/ML IJ SOLN
1000.0000 ug | Freq: Once | INTRAMUSCULAR | Status: AC
  Administered 2012-02-24: 1000 ug via INTRAMUSCULAR

## 2012-02-29 ENCOUNTER — Other Ambulatory Visit: Payer: Self-pay | Admitting: Internal Medicine

## 2012-03-28 ENCOUNTER — Ambulatory Visit (INDEPENDENT_AMBULATORY_CARE_PROVIDER_SITE_OTHER): Payer: Medicare Other | Admitting: Internal Medicine

## 2012-03-28 DIAGNOSIS — E538 Deficiency of other specified B group vitamins: Secondary | ICD-10-CM

## 2012-03-28 MED ORDER — CYANOCOBALAMIN 1000 MCG/ML IJ SOLN
1000.0000 ug | Freq: Once | INTRAMUSCULAR | Status: AC
  Administered 2012-03-28: 1000 ug via INTRAMUSCULAR

## 2012-03-29 ENCOUNTER — Other Ambulatory Visit: Payer: Self-pay | Admitting: Internal Medicine

## 2012-04-05 ENCOUNTER — Ambulatory Visit (INDEPENDENT_AMBULATORY_CARE_PROVIDER_SITE_OTHER): Payer: Medicare Other | Admitting: Internal Medicine

## 2012-04-05 ENCOUNTER — Encounter: Payer: Self-pay | Admitting: Internal Medicine

## 2012-04-05 VITALS — BP 112/64 | HR 72 | Temp 98.1°F | Wt 143.0 lb

## 2012-04-05 DIAGNOSIS — G4730 Sleep apnea, unspecified: Secondary | ICD-10-CM

## 2012-04-05 DIAGNOSIS — N400 Enlarged prostate without lower urinary tract symptoms: Secondary | ICD-10-CM

## 2012-04-05 MED ORDER — FINASTERIDE 5 MG PO TABS: 5.0000 mg | ORAL_TABLET | Freq: Every day | ORAL | Status: DC

## 2012-04-05 MED ORDER — HYDROCORTISONE 2.5 % EX CREA: TOPICAL_CREAM | Freq: Two times a day (BID) | CUTANEOUS | Status: DC

## 2012-04-05 NOTE — Assessment & Plan Note (Signed)
Discussed at length Add proscar

## 2012-04-05 NOTE — Progress Notes (Signed)
Nocturia-- 3-4 times. Has had dx of bph for years (terazosin). sxs have worsened over past 6 months.  ENT evaluation---has deviated sxs (considering surgery--- working with [employer]). He admits to snoring and wife is concerned with apneic spells.  Bug bite--- left leg-- improving  No past medical history on file.  History   Social History  . Marital Status: Married    Spouse Name: N/A    Number of Children: N/A  . Years of Education: N/A   Occupational History  . Not on file.   Social History Main Topics  . Smoking status: Former Games developer  . Smokeless tobacco: Not on file  . Alcohol Use: Not on file  . Drug Use: Not on file  . Sexually Active: Not on file   Other Topics Concern  . Not on file   Social History Narrative  . No narrative on file    No past surgical history on file.  No family history on file.  No Known Allergies  Current Outpatient Prescriptions on File Prior to Visit  Medication Sig Dispense Refill  . aspirin 81 MG chewable tablet Chew 81 mg by mouth daily.        . citalopram (CELEXA) 20 MG tablet take 1 tablet by mouth once daily  30 tablet  0  . esomeprazole (NEXIUM) 40 MG capsule Take 40 mg by mouth daily before breakfast.        . terazosin (HYTRIN) 5 MG capsule take 1 capsule by mouth once daily  30 capsule  5     patient denies chest pain, shortness of breath, orthopnea. Denies lower extremity edema, abdominal pain, change in appetite, change in bowel movements. Patient denies rashes, musculoskeletal complaints. No other specific complaints in a complete review of systems.   BP 112/64  Pulse 72  Temp(Src) 98.1 F (36.7 C) (Oral)  Wt 143 lb (64.864 kg)  well-developed well-nourished male in no acute distress. HEENT exam atraumatic, normocephalic, neck supple without jugular venous distention. Chest clear to auscultation cardiac exam S1-S2 are regular. Abdominal exam overweight with bowel sounds, soft and nontender. Extremities no  edema. Neurologic exam is alert with a normal gait.   A/P--? Sleep apnea--- polysomnography

## 2012-05-01 ENCOUNTER — Other Ambulatory Visit: Payer: Self-pay | Admitting: Internal Medicine

## 2012-05-01 ENCOUNTER — Telehealth: Payer: Self-pay | Admitting: Internal Medicine

## 2012-05-01 DIAGNOSIS — N400 Enlarged prostate without lower urinary tract symptoms: Secondary | ICD-10-CM

## 2012-05-01 NOTE — Telephone Encounter (Signed)
Ok per Dr. Swords, referral order placed 

## 2012-05-01 NOTE — Telephone Encounter (Signed)
Patient called stating that he would like to be referred to a Urologist for his prostate issues that are not improving. Please advise.

## 2012-05-07 ENCOUNTER — Ambulatory Visit (INDEPENDENT_AMBULATORY_CARE_PROVIDER_SITE_OTHER): Payer: Medicare Other | Admitting: Internal Medicine

## 2012-05-07 DIAGNOSIS — E538 Deficiency of other specified B group vitamins: Secondary | ICD-10-CM

## 2012-05-07 MED ORDER — CYANOCOBALAMIN 1000 MCG/ML IJ SOLN
1000.0000 ug | Freq: Once | INTRAMUSCULAR | Status: AC
  Administered 2012-05-07: 1000 ug via INTRAMUSCULAR

## 2012-05-08 ENCOUNTER — Ambulatory Visit (HOSPITAL_BASED_OUTPATIENT_CLINIC_OR_DEPARTMENT_OTHER): Payer: Medicare Other | Attending: Internal Medicine | Admitting: General Practice

## 2012-05-08 VITALS — Ht 67.0 in | Wt 140.0 lb

## 2012-05-08 DIAGNOSIS — G4733 Obstructive sleep apnea (adult) (pediatric): Secondary | ICD-10-CM

## 2012-05-08 DIAGNOSIS — G4730 Sleep apnea, unspecified: Secondary | ICD-10-CM

## 2012-05-19 DIAGNOSIS — G4733 Obstructive sleep apnea (adult) (pediatric): Secondary | ICD-10-CM

## 2012-05-20 NOTE — Procedures (Signed)
NAMENDREW, CREASON NO.:  [id number]  MEDICAL RECORD NO.:  [id number]          PATIENT TYPE:  OUT  LOCATION:  SLEEP CENTER                 FACILITY:  [facility]  PHYSICIAN:  Barbaraann Share, MD,FCCPDATE OF BIRTH:  04/28/1938  DATE OF STUDY:  05/08/2012                           NOCTURNAL POLYSOMNOGRAM  REFERRING PHYSICIAN:  DOE-HYUN R YOO  LOCATION:  Sleep Lab.  REFERRING PHYSICIAN:  Barbette Hair. Artist Pais, DO  INDICATIONS OF STUDY: Hypersomnia with sleep apnea.  EPWORTH SCORE:  6.  SLEEP ARCHITECTURE:  The patient had total sleep time of 284 minutes with no slow-wave sleep and only 14 minutes of REM.  Sleep onset latency was normal at 9.5 minutes, and REM onset was very prolonged at 366 minutes.  Sleep efficiency was poor at 68%.  RESPIRATORY DATA:  The patient was found to have 75 apneas as well as 11 obstructive hypopneas, giving him an apnea-hypopnea index of 18 events per hour.  The events were more common in the supine position, and there was loud snoring noted throughout.  The patient did not meet split night protocol secondary to the majority of his events occurring well after 2 a.m.  OXYGEN DATA:  There was O2 desaturation as low as 87% with the patient's obstructive events.  CARDIAC DATA:  No clinically significant arrhythmias were noted.  MOVEMENTS/PARASOMNIA:  The patient had no significant leg jerks or other abnormal behaviors noted.  IMPRESSION/RECOMMENDATION: Mild-to-moderate obstructive sleep apnea/hypopnea syndrome with an AHI of 18 events per hour and transient O2 desaturation as low as 87%.  Treatment for this degree of sleep apnea can include upper airway surgery, dental appliance, and also CPAP. Clinical correlation is suggested.     Barbaraann Share, MD,FCCP Diplomate, American Board of Sleep Medicine    [facility]/MEDQ  D:  05/19/2012 16:37:23  T:  05/20/2012 02:40:25  Job:  [id number]

## 2012-05-30 ENCOUNTER — Other Ambulatory Visit: Payer: Self-pay | Admitting: Internal Medicine

## 2012-06-06 ENCOUNTER — Telehealth: Payer: Self-pay | Admitting: *Deleted

## 2012-06-06 NOTE — Telephone Encounter (Signed)
Pt is wanting sleep study results

## 2012-06-08 ENCOUNTER — Ambulatory Visit (INDEPENDENT_AMBULATORY_CARE_PROVIDER_SITE_OTHER): Payer: Medicare Other | Admitting: Internal Medicine

## 2012-06-08 DIAGNOSIS — E538 Deficiency of other specified B group vitamins: Secondary | ICD-10-CM

## 2012-06-08 NOTE — Telephone Encounter (Signed)
referrral order placed

## 2012-06-08 NOTE — Telephone Encounter (Signed)
Patient does have mild to moderate sleep apnea. It is probably worth trying CPAP. Please order CPAP was started at 8 cm of water. Ask for home health agency to do a titration study at home.

## 2012-06-20 MED ORDER — CYANOCOBALAMIN 1000 MCG/ML IJ SOLN: 1000.0000 ug | Freq: Once | INTRAMUSCULAR | Status: DC

## 2012-06-20 MED ORDER — CYANOCOBALAMIN 1000 MCG/ML IJ SOLN
1000.0000 ug | Freq: Once | INTRAMUSCULAR | Status: AC
  Administered 2012-06-20: 1000 ug via INTRAMUSCULAR

## 2012-07-02 ENCOUNTER — Other Ambulatory Visit: Payer: Self-pay | Admitting: Internal Medicine

## 2012-07-09 ENCOUNTER — Ambulatory Visit (INDEPENDENT_AMBULATORY_CARE_PROVIDER_SITE_OTHER): Payer: Medicare Other | Admitting: Internal Medicine

## 2012-07-09 DIAGNOSIS — E538 Deficiency of other specified B group vitamins: Secondary | ICD-10-CM

## 2012-07-09 DIAGNOSIS — Z23 Encounter for immunization: Secondary | ICD-10-CM

## 2012-07-09 MED ORDER — CYANOCOBALAMIN 1000 MCG/ML IJ SOLN
1000.0000 ug | INTRAMUSCULAR | Status: DC
  Administered 2012-07-09: 1000 ug via INTRAMUSCULAR

## 2012-07-26 ENCOUNTER — Encounter (HOSPITAL_COMMUNITY): Payer: Self-pay | Admitting: Pharmacy Technician

## 2012-07-27 ENCOUNTER — Encounter (HOSPITAL_COMMUNITY)
Admission: RE | Admit: 2012-07-27 | Discharge: 2012-07-27 | Disposition: A | Discharge status: Home / Self Care | Payer: Medicare Other | Source: Ambulatory Visit | Attending: Otolaryngology | Admitting: Otolaryngology

## 2012-07-27 ENCOUNTER — Encounter (HOSPITAL_COMMUNITY): Payer: Self-pay

## 2012-07-27 HISTORY — DX: Gastro-esophageal reflux disease without esophagitis: K21.9

## 2012-07-27 HISTORY — DX: Benign prostatic hyperplasia without lower urinary tract symptoms: N40.0

## 2012-07-27 HISTORY — DX: Sleep apnea, unspecified: G47.30

## 2012-07-27 HISTORY — DX: Anxiety disorder, unspecified: F41.9

## 2012-07-27 LAB — BASIC METABOLIC PANEL
BUN: 20 mg/dL (ref 6–23)
CO2: 25 mEq/L (ref 19–32)
Calcium: 8.8 mg/dL (ref 8.4–10.5)
Creatinine, Ser: 0.82 mg/dL (ref 0.50–1.35)
Glucose, Bld: 147 mg/dL — ABNORMAL HIGH (ref 70–99)

## 2012-07-27 LAB — CBC
MCH: 25.8 pg — ABNORMAL LOW (ref 26.0–34.0)
MCHC: 33.2 g/dL (ref 30.0–36.0)
MCV: 77.7 fL — ABNORMAL LOW (ref 78.0–100.0)
Platelets: 225 10*3/uL (ref 150–400)
RDW: 13.9 % (ref 11.5–15.5)
WBC: 4.8 10*3/uL (ref 4.0–10.5)

## 2012-07-27 LAB — SURGICAL PCR SCREEN: Staphylococcus aureus: NEGATIVE

## 2012-07-27 NOTE — Pre-Procedure Instructions (Signed)
20 [name]  07/27/2012   Your procedure is scheduled on:  August 03, 2012  Report to [facility] Short Stay Center at 7:00 AM.  Call this number if you have problems the morning of surgery: [phone]   Remember:   Do not eat food:After Midnight.  .  Take these medicines the morning of surgery with A SIP OF WATER: nexium, hytrin          STOP ASPIRIN   Do not wear jewelry, make-up or nail polish.  Do not wear lotions, powders, or perfumes. You may wear deodorant.  Do not shave 48 hours prior to surgery. Men may shave face and neck.  Do not bring valuables to the hospital.  Contacts, dentures or bridgework may not be worn into surgery.  Leave suitcase in the car. After surgery it may be brought to your room.  For patients admitted to the hospital, checkout time is 11:00 AM the day of discharge.   Patients discharged the day of surgery will not be allowed to drive home.  Name and phone number of your driver:   Special Instructions: Shower using CHG 2 nights before surgery and the night before surgery.  If you shower the day of surgery use CHG.  Use special wash - you have one bottle of CHG for all showers.  You should use approximately 1/3 of the bottle for each shower.   Please read over the following fact sheets that you were given: Pain Booklet, Coughing and Deep Breathing and Surgical Site Infection Prevention

## 2012-07-27 NOTE — H&P (Signed)
  Assessment  . Nasal passage blockage   (478.19) . Deviated nasal septum (acquired)   (470) Discussed  Severe nasal septal deviation with symptomatic obstruction. We discussed the only possible help for this would be an operation. He will think about this and decide if he is interested. Reason For Visit  [name] is here today at the kind request of Swords, Smitty Cords for consultation and opinion. HPI  Significant nasal obstruction his entire life. He has a known deviated septum. He doesnt have any definite history of trauma. His father and his own son has had surgery for this. Allergies  No Known Drug Allergies. Current Meds  Citalopram Hydrobromide 20 MG Oral Tablet;; RPT NexIUM 40 MG Oral Capsule Delayed Release;; RPT Terazosin HCl 5 MG Oral Capsule;; RPT. Active Problems  Heartburn (Symptom) (787.1). Personal Hx  Alcohol Use Marital History - Currently Married Never A Smoker. ROS  12 system ROS was obtained and reviewed on the Health Maintenance form dated today.  Positive responses are shown above.  If the symptom is not checked, the patient has denied it. Vital Signs   Recorded by [facility] on 01 Dec 2011 03:10 PM BP:110/70,  HR: 65 b/min,  Height: 67.000000 in, Weight: 161.096045 lb, BMI: 22.6 kg/m2,  BSA Calculated: 1.76 ,  BMI Calculated: 22.60. Physical Exam  APPEARANCE: Well developed, well nourished, in no acute distress.  Normal affect, in a pleasant mood.  Oriented to time, place and person. COMMUNICATION: Normal voice   HEAD & FACE:  No scars, lesions or masses of head and face.  Sinuses nontender to palpation.  Salivary glands without mass or tenderness.  Facial strength symmetric.  No facial lesion, scars, or mass. EYES: EOMI with normal primary gaze alignment. Visual acuity grossly intact.  PERRLA EXTERNAL EAR & NOSE: No scars, lesions or masses  EAC & TYMPANIC MEMBRANE:  EAC shows no obstructing lesions or debris and tympanic membranes are normal  bilaterally with good movement to insufflation. GROSS HEARING: Normal   TMJ:  Nontender  INTRANASAL EXAM: No polyps or purulence. Severe rightward deflection of the quadrangular cartilage. It appears to be off the maxillary crest completely and separated from the posterior bone. This causes near complete obstruction on the right and significant obstruction on the left. NASOPHARYNX: Normal, without lesions. LIPS, TEETH & GUMS: No lip lesions, normal dentition and normal gums. ORAL CAVITY/OROPHARYNX:  Oral mucosa moist without lesion or asymmetry of the palate, tongue, tonsil or posterior pharynx. NECK:  Supple without adenopathy or mass. THYROID:  Normal with no masses palpable.  NEUROLOGIC:  No gross CN deficits. No nystagmus noted.   LYMPHATIC:  No enlarged nodes palpable. Signature  Electronically signed by : Serena Colonel  M.D.; 12/01/2011 3:29 PM EST.

## 2012-07-30 ENCOUNTER — Encounter: Payer: Self-pay | Admitting: Internal Medicine

## 2012-08-01 ENCOUNTER — Other Ambulatory Visit: Payer: Self-pay | Admitting: Internal Medicine

## 2012-08-02 ENCOUNTER — Other Ambulatory Visit: Payer: Self-pay | Admitting: Family Medicine

## 2012-08-02 MED ORDER — CEFAZOLIN SODIUM-DEXTROSE 2-3 GM-% IV SOLR
2.0000 g | INTRAVENOUS | Status: AC
  Administered 2012-08-03: 2 g via INTRAVENOUS
  Filled 2012-08-02: qty 50

## 2012-08-02 MED ORDER — TERAZOSIN HCL 5 MG PO CAPS: 5.0000 mg | ORAL_CAPSULE | Freq: Every day | ORAL | Status: DC

## 2012-08-03 ENCOUNTER — Encounter (HOSPITAL_COMMUNITY)
Admission: RE | Disposition: A | Discharge status: Home / Self Care | Payer: Self-pay | Source: Ambulatory Visit | Attending: Otolaryngology

## 2012-08-03 ENCOUNTER — Ambulatory Visit (HOSPITAL_COMMUNITY)
Admission: RE | Admit: 2012-08-03 | Discharge: 2012-08-03 | Disposition: A | Discharge status: Home / Self Care | Payer: Medicare Other | Source: Ambulatory Visit | Attending: Otolaryngology | Admitting: Otolaryngology

## 2012-08-03 ENCOUNTER — Ambulatory Visit (HOSPITAL_COMMUNITY): Payer: Medicare Other | Admitting: Anesthesiology

## 2012-08-03 ENCOUNTER — Encounter (HOSPITAL_COMMUNITY): Payer: Self-pay | Admitting: Anesthesiology

## 2012-08-03 ENCOUNTER — Encounter (HOSPITAL_COMMUNITY): Payer: Self-pay | Admitting: *Deleted

## 2012-08-03 DIAGNOSIS — Z01810 Encounter for preprocedural cardiovascular examination: Secondary | ICD-10-CM | POA: Insufficient documentation

## 2012-08-03 DIAGNOSIS — Z01812 Encounter for preprocedural laboratory examination: Secondary | ICD-10-CM | POA: Insufficient documentation

## 2012-08-03 DIAGNOSIS — J343 Hypertrophy of nasal turbinates: Secondary | ICD-10-CM | POA: Insufficient documentation

## 2012-08-03 DIAGNOSIS — Z01818 Encounter for other preprocedural examination: Secondary | ICD-10-CM | POA: Insufficient documentation

## 2012-08-03 DIAGNOSIS — J342 Deviated nasal septum: Secondary | ICD-10-CM

## 2012-08-03 HISTORY — PX: NASAL SEPTOPLASTY W/ TURBINOPLASTY: SHX2070

## 2012-08-03 SURGERY — SEPTOPLASTY, NOSE, WITH NASAL TURBINATE REDUCTION
Anesthesia: General | Site: Face | Laterality: Bilateral | Wound class: Clean Contaminated

## 2012-08-03 MED ORDER — LACTATED RINGERS IV SOLN
INTRAVENOUS | Status: DC
  Administered 2012-08-03: 08:00:00 via INTRAVENOUS

## 2012-08-03 MED ORDER — LIDOCAINE-EPINEPHRINE 1 %-1:100000 IJ SOLN
INTRAMUSCULAR | Status: AC
  Filled 2012-08-03: qty 1

## 2012-08-03 MED ORDER — FENTANYL CITRATE 0.05 MG/ML IJ SOLN
INTRAMUSCULAR | Status: DC | PRN
  Administered 2012-08-03: 100 ug via INTRAVENOUS
  Administered 2012-08-03 (×2): 50 ug via INTRAVENOUS

## 2012-08-03 MED ORDER — DEXAMETHASONE SODIUM PHOSPHATE 4 MG/ML IJ SOLN
INTRAMUSCULAR | Status: DC | PRN
  Administered 2012-08-03: 8 mg via INTRAVENOUS

## 2012-08-03 MED ORDER — LIDOCAINE HCL (CARDIAC) 20 MG/ML IV SOLN
INTRAVENOUS | Status: DC | PRN
  Administered 2012-08-03: 100 mg via INTRAVENOUS

## 2012-08-03 MED ORDER — MIDAZOLAM HCL 5 MG/5ML IJ SOLN
INTRAMUSCULAR | Status: DC | PRN
  Administered 2012-08-03 (×2): 1 mg via INTRAVENOUS

## 2012-08-03 MED ORDER — OXYMETAZOLINE HCL 0.05 % NA SOLN
NASAL | Status: DC | PRN
  Administered 2012-08-03: 1 via NASAL

## 2012-08-03 MED ORDER — HYDROCODONE-ACETAMINOPHEN 7.5-500 MG PO TABS: 1.0000 | ORAL_TABLET | Freq: Four times a day (QID) | ORAL | Status: DC | PRN

## 2012-08-03 MED ORDER — GLYCOPYRROLATE 0.2 MG/ML IJ SOLN
INTRAMUSCULAR | Status: DC | PRN
  Administered 2012-08-03: 0.2 mg via INTRAVENOUS

## 2012-08-03 MED ORDER — PHENYLEPHRINE HCL 10 MG/ML IJ SOLN
INTRAMUSCULAR | Status: DC | PRN
  Administered 2012-08-03 (×4): 80 ug via INTRAVENOUS

## 2012-08-03 MED ORDER — OXYCODONE HCL 5 MG/5ML PO SOLN: 5.0000 mg | Freq: Once | ORAL | Status: DC | PRN

## 2012-08-03 MED ORDER — PROPOFOL 10 MG/ML IV BOLUS
INTRAVENOUS | Status: DC | PRN
  Administered 2012-08-03: 200 mg via INTRAVENOUS

## 2012-08-03 MED ORDER — HYDROMORPHONE HCL PF 1 MG/ML IJ SOLN: 0.2500 mg | INTRAMUSCULAR | Status: DC | PRN

## 2012-08-03 MED ORDER — METOCLOPRAMIDE HCL 5 MG/ML IJ SOLN: 10.0000 mg | Freq: Once | INTRAMUSCULAR | Status: DC | PRN

## 2012-08-03 MED ORDER — OXYCODONE HCL 5 MG PO TABS: 5.0000 mg | ORAL_TABLET | Freq: Once | ORAL | Status: DC | PRN

## 2012-08-03 MED ORDER — BACITRACIN ZINC 500 UNIT/GM EX OINT
TOPICAL_OINTMENT | CUTANEOUS | Status: DC | PRN
  Administered 2012-08-03: 1 via TOPICAL

## 2012-08-03 MED ORDER — LIDOCAINE-EPINEPHRINE 1 %-1:100000 IJ SOLN
INTRAMUSCULAR | Status: DC | PRN
  Administered 2012-08-03: 20 mL

## 2012-08-03 MED ORDER — PROMETHAZINE HCL 25 MG RE SUPP: 25.0000 mg | Freq: Four times a day (QID) | RECTAL | Status: DC | PRN

## 2012-08-03 MED ORDER — LACTATED RINGERS IV SOLN
INTRAVENOUS | Status: DC | PRN
  Administered 2012-08-03 (×2): via INTRAVENOUS

## 2012-08-03 MED ORDER — CEPHALEXIN 500 MG PO CAPS: 500.0000 mg | ORAL_CAPSULE | Freq: Three times a day (TID) | ORAL | Status: DC

## 2012-08-03 MED ORDER — OXYMETAZOLINE HCL 0.05 % NA SOLN
NASAL | Status: AC
  Filled 2012-08-03: qty 15

## 2012-08-03 MED ORDER — SUCCINYLCHOLINE CHLORIDE 20 MG/ML IJ SOLN
INTRAMUSCULAR | Status: DC | PRN
  Administered 2012-08-03: 100 mg via INTRAVENOUS

## 2012-08-03 MED ORDER — BACITRACIN ZINC 500 UNIT/GM EX OINT
TOPICAL_OINTMENT | CUTANEOUS | Status: AC
  Filled 2012-08-03: qty 15

## 2012-08-03 MED ORDER — ONDANSETRON HCL 4 MG/2ML IJ SOLN
INTRAMUSCULAR | Status: DC | PRN
  Administered 2012-08-03: 4 mg via INTRAVENOUS

## 2012-08-03 SURGICAL SUPPLY — 31 items
.030 SILASTIC SHEETING ×1 IMPLANT
ATTRACTOMAT 16X20 MAGNETIC DRP (DRAPES) IMPLANT
CANISTER SUCTION 2500CC (MISCELLANEOUS) ×2 IMPLANT
CLOTH BEACON ORANGE TIMEOUT ST (SAFETY) ×2 IMPLANT
COAGULATOR SUCT SWTCH 10FR 6 (ELECTROSURGICAL) IMPLANT
CRADLE DONUT ADULT HEAD (MISCELLANEOUS) ×2 IMPLANT
DECANTER SPIKE VIAL GLASS SM (MISCELLANEOUS) ×1 IMPLANT
DRESSING TELFA 8X3 (GAUZE/BANDAGES/DRESSINGS) ×2 IMPLANT
GAUZE SPONGE 2X2 8PLY STRL LF (GAUZE/BANDAGES/DRESSINGS) ×1 IMPLANT
GLOVE BIOGEL PI IND STRL 7.0 (GLOVE) IMPLANT
GLOVE BIOGEL PI INDICATOR 7.0 (GLOVE) ×1
GLOVE ECLIPSE 7.5 STRL STRAW (GLOVE) ×2 IMPLANT
GLOVE SURG SS PI 7.0 STRL IVOR (GLOVE) ×1 IMPLANT
GOWN STRL NON-REIN LRG LVL3 (GOWN DISPOSABLE) ×4 IMPLANT
KIT BASIN OR (CUSTOM PROCEDURE TRAY) ×2 IMPLANT
KIT ROOM TURNOVER OR (KITS) ×2 IMPLANT
NEEDLE 27GAX1X1/2 (NEEDLE) ×2 IMPLANT
NS IRRIG 1000ML POUR BTL (IV SOLUTION) ×2 IMPLANT
PAD ARMBOARD 7.5X6 YLW CONV (MISCELLANEOUS) ×4 IMPLANT
PATTIES SURGICAL .5 X3 (DISPOSABLE) ×2 IMPLANT
SPONGE GAUZE 2X2 STER 10/PKG (GAUZE/BANDAGES/DRESSINGS) ×1
SUT CHROMIC 4 0 P 3 18 (SUTURE) ×2 IMPLANT
SUT ETHILON 3 0 PS 1 (SUTURE) ×2 IMPLANT
SUT ETHILON 4 0 CL P 3 (SUTURE) ×1 IMPLANT
SUT PLAIN 4 0 ~~LOC~~ 1 (SUTURE) ×2 IMPLANT
TAPE SURG TRANSPORE 1 IN (GAUZE/BANDAGES/DRESSINGS) ×1 IMPLANT
TAPE SURGICAL TRANSPORE 1 IN (GAUZE/BANDAGES/DRESSINGS) ×1
TOWEL OR 17X24 6PK STRL BLUE (TOWEL DISPOSABLE) ×2 IMPLANT
TOWEL OR 17X26 10 PK STRL BLUE (TOWEL DISPOSABLE) ×2 IMPLANT
TRAY ENT MC OR (CUSTOM PROCEDURE TRAY) ×2 IMPLANT
WATER STERILE IRR 1000ML POUR (IV SOLUTION) ×1 IMPLANT

## 2012-08-03 NOTE — Anesthesia Postprocedure Evaluation (Signed)
Anesthesia Post Note  Patient: [name]  Procedure(s) Performed: Procedure(s) (LRB): NASAL SEPTOPLASTY WITH TURBINATE REDUCTION (Bilateral)  Anesthesia type: General  Patient location: PACU  Post pain: Pain level controlled  Post assessment: Patient's Cardiovascular Status Stable  Last Vitals:  Filed Vitals:   08/03/12 1045  BP:   Pulse: 90  Temp:   Resp: 17    Post vital signs: Reviewed and stable  Level of consciousness: alert  Complications: No apparent anesthesia complications

## 2012-08-03 NOTE — Anesthesia Procedure Notes (Signed)
Procedure Name: Intubation Date/Time: 08/03/2012 9:05 AM Performed by: Orlinda Blalock, Lavere Stork L Pre-anesthesia Checklist: Patient identified, Emergency Drugs available, Suction available, Patient being monitored and Timeout performed Patient Re-evaluated:Patient Re-evaluated prior to inductionOxygen Delivery Method: Circle system utilized Preoxygenation: Pre-oxygenation with 100% oxygen Intubation Type: IV induction and Cricoid Pressure applied Ventilation: Mask ventilation without difficulty Tube type: Oral Tube size: 7.5 mm Number of attempts: 1 Airway Equipment and Method: Video-laryngoscopy Placement Confirmation: ETT inserted through vocal cords under direct vision,  positive ETCO2,  CO2 detector and breath sounds checked- equal and bilateral Secured at: 23 cm Tube secured with: Tape Dental Injury: Teeth and Oropharynx as per pre-operative assessment  Difficulty Due To: Difficulty was anticipated and Difficult Airway- due to anterior larynx Future Recommendations: Recommend- induction with short-acting agent, and alternative techniques readily available

## 2012-08-03 NOTE — Anesthesia Preprocedure Evaluation (Addendum)
Anesthesia Evaluation  Patient identified by MRN, date of birth, ID band Patient awake    Reviewed: Allergy & Precautions, H&P , NPO status , Patient's Chart, lab work & pertinent test results  History of Anesthesia Complications Negative for: history of anesthetic complications  Airway       Dental   Pulmonary sleep apnea and Continuous Positive Airway Pressure Ventilation ,          Cardiovascular negative cardio ROS      Neuro/Psych PSYCHIATRIC DISORDERS Anxiety Depression negative neurological ROS     GI/Hepatic Neg liver ROS, GERD-  Medicated and Controlled,  Endo/Other    Renal/GU negative Renal ROS   BPH    Musculoskeletal negative musculoskeletal ROS (+)   Abdominal   Peds  Hematology negative hematology ROS (+)   Anesthesia Other Findings   Reproductive/Obstetrics                           Anesthesia Physical Anesthesia Plan  ASA: II  Anesthesia Plan: General   Post-op Pain Management:    Induction: Intravenous  Airway Management Planned: Oral ETT  Additional Equipment:   Intra-op Plan:   Post-operative Plan: Extubation in OR  Informed Consent: I have reviewed the patients History and Physical, chart, labs and discussed the procedure including the risks, benefits and alternatives for the proposed anesthesia with the patient or authorized representative who has indicated his/her understanding and acceptance.   Dental advisory given  Plan Discussed with: CRNA and Anesthesiologist  Anesthesia Plan Comments:        Anesthesia Quick Evaluation

## 2012-08-03 NOTE — Transfer of Care (Signed)
Immediate Anesthesia Transfer of Care Note  Patient: [name]  Procedure(s) Performed: Procedure(s) (LRB) with comments: NASAL SEPTOPLASTY WITH TURBINATE REDUCTION (Bilateral)  Patient Location: PACU  Anesthesia Type: General  Level of Consciousness: awake, alert , oriented and patient cooperative  Airway & Oxygen Therapy: Patient Spontanous Breathing  Post-op Assessment: Report given to PACU RN, Post -op Vital signs reviewed and stable and Patient moving all extremities  Post vital signs: Reviewed and stable  Complications: No apparent anesthesia complications

## 2012-08-03 NOTE — Interval H&P Note (Signed)
History and Physical Interval Note:  08/03/2012 8:27 AM  [name]  has presented today for surgery, with the diagnosis of deviated nasal septum turbinate hyperthrophy  The various methods of treatment have been discussed with the patient and family. After consideration of risks, benefits and other options for treatment, the patient has consented to  Procedure(s) (LRB) with comments: NASAL SEPTOPLASTY WITH TURBINATE REDUCTION (Bilateral) as a surgical intervention .  The patient's history has been reviewed, patient examined, no change in status, stable for surgery.  I have reviewed the patient's chart and labs.  Questions were answered to the patient's satisfaction.     Alishah Schulte

## 2012-08-03 NOTE — Op Note (Signed)
OPERATIVE REPORT  DATE OF SURGERY: 08/03/2012  PATIENT:  Worthy Rancher,  76 y.o. male  PRE-OPERATIVE DIAGNOSIS:  deviated nasal septum turbinate hyperthrophy  POST-OPERATIVE DIAGNOSIS:  * No post-op diagnosis entered *  PROCEDURE:  Procedure(s): NASAL SEPTOPLASTY WITH TURBINATE REDUCTION  SURGEON:  Susy Frizzle, MD  ASSISTANTS: none   ANESTHESIA:   general  EBL:  15 ml  DRAINS: none   LOCAL MEDICATIONS USED:  LIDOCAINE   SPECIMEN:  No Specimen  COUNTS:  YES  PROCEDURE DETAILS: The patient was taken to the operating room and placed on the operating table in the supine position. Following induction of general endotracheal anesthesia, the nose was draped in a standard fashion. Afrin was used preoperatively. 1% Xylocaine with epinephrine was infiltrated into the septum, columella, and the inferior turbinates bilaterally. Afrin pledgets were then placed bilaterally. A left hemitransfixion incision was used to approach the septal cartilage. A 15 scalpel used. A mucoperichondrial flap was developed posteriorly down the left side to the sphenoid rostrum. The bony cartilaginous junction was divided and a similar flap was developed down the right side. The ethmoid plate was thickened and severely angulated and was nearly completely resected using Jansen-Middleton rongeur. The caudal septum was severely deflected with a right angle band/fracture with severe right side "show". This was dissected out of its surrounding mucosa on both sides and out of the columellar pocket. The large fragment that was bent back on itself was resected. The remaining fragment did offer significant stability of the nasal tip. The nasal spine was deflected to the right and was trimmed back using a 4 mm osteotome. This rendered the remaining septal cartilage inadequate. A piece from the resected cartilage was then cut to size and shape and used to stabilize the caudal cartilage. This was done down to the nasal spine soft  tissue using several 4-0 undyed nylon sutures. This provided good stability and nice straightening of the nasal tip. The mucosal incision was reapproximated with chromic suture. The septal flaps were quilted with 4-0 plain gut. Silastic splints were created bilaterally and secured in place with a nylon suture. Bacitracin ointment was used as well.  The inferior turbinates were then partially resected by creating a mucosal incision anteriorly, dissecting out the bone which was then fractured into multiple fragments, some of which were removed. The remnant was then outfractured using the Cottle elevator. Nasal cavities were significantly improved from this procedure. Blood and secretions were suctioned. The nasal cavities were then packed with rolled up Telfa coated with bacitracin ointment. He was then awakened, extubated and transferred to recovery in stable condition.   PATIENT DISPOSITION:  PACU - hemodynamically stable.

## 2012-08-06 ENCOUNTER — Ambulatory Visit: Payer: Medicare Other | Admitting: Internal Medicine

## 2012-08-06 ENCOUNTER — Encounter (HOSPITAL_COMMUNITY): Payer: Self-pay | Admitting: Otolaryngology

## 2012-08-15 ENCOUNTER — Ambulatory Visit (INDEPENDENT_AMBULATORY_CARE_PROVIDER_SITE_OTHER): Payer: Medicare Other | Admitting: *Deleted

## 2012-08-15 DIAGNOSIS — E538 Deficiency of other specified B group vitamins: Secondary | ICD-10-CM

## 2012-08-16 MED ORDER — CYANOCOBALAMIN 1000 MCG/ML IJ SOLN
1000.0000 ug | Freq: Once | INTRAMUSCULAR | Status: AC
  Administered 2012-08-16: 1000 ug via INTRAMUSCULAR

## 2012-09-12 ENCOUNTER — Ambulatory Visit: Payer: Medicare Other | Admitting: Internal Medicine

## 2012-09-12 ENCOUNTER — Ambulatory Visit (INDEPENDENT_AMBULATORY_CARE_PROVIDER_SITE_OTHER): Payer: Medicare Other | Admitting: *Deleted

## 2012-09-12 DIAGNOSIS — E538 Deficiency of other specified B group vitamins: Secondary | ICD-10-CM

## 2012-09-12 MED ORDER — CYANOCOBALAMIN 1000 MCG/ML IJ SOLN
1000.0000 ug | Freq: Once | INTRAMUSCULAR | Status: AC
  Administered 2012-09-12: 1000 ug via INTRAMUSCULAR

## 2012-09-24 ENCOUNTER — Other Ambulatory Visit: Payer: Self-pay | Admitting: Internal Medicine

## 2012-11-05 ENCOUNTER — Ambulatory Visit (INDEPENDENT_AMBULATORY_CARE_PROVIDER_SITE_OTHER): Payer: Medicare Other | Admitting: Internal Medicine

## 2012-11-05 DIAGNOSIS — E538 Deficiency of other specified B group vitamins: Secondary | ICD-10-CM

## 2012-11-05 MED ORDER — CYANOCOBALAMIN 1000 MCG/ML IJ SOLN
1000.0000 ug | Freq: Once | INTRAMUSCULAR | Status: AC
  Administered 2012-11-05: 1000 ug via INTRAMUSCULAR

## 2012-11-05 MED ORDER — CYANOCOBALAMIN 2000 MCG PO TABS: 2000.0000 ug | ORAL_TABLET | Freq: Every day | ORAL | Status: DC

## 2013-02-01 ENCOUNTER — Telehealth: Payer: Self-pay | Admitting: Internal Medicine

## 2013-02-01 ENCOUNTER — Other Ambulatory Visit: Payer: Self-pay | Admitting: Internal Medicine

## 2013-02-01 NOTE — Telephone Encounter (Signed)
rx sent in electronically 

## 2013-02-01 NOTE — Telephone Encounter (Signed)
Pt needs refill of terazosin (HYTRIN) 5 MG capsule, 90 day supply. Pharm: [facility]

## 2013-04-01 ENCOUNTER — Ambulatory Visit (INDEPENDENT_AMBULATORY_CARE_PROVIDER_SITE_OTHER): Payer: Medicare Other | Admitting: Internal Medicine

## 2013-04-01 ENCOUNTER — Telehealth: Payer: Self-pay | Admitting: Internal Medicine

## 2013-04-01 ENCOUNTER — Encounter: Payer: Self-pay | Admitting: Internal Medicine

## 2013-04-01 VITALS — BP 122/70 | HR 68 | Temp 98.1°F | Wt 144.0 lb

## 2013-04-01 DIAGNOSIS — E785 Hyperlipidemia, unspecified: Secondary | ICD-10-CM

## 2013-04-01 DIAGNOSIS — K219 Gastro-esophageal reflux disease without esophagitis: Secondary | ICD-10-CM

## 2013-04-01 DIAGNOSIS — F341 Dysthymic disorder: Secondary | ICD-10-CM

## 2013-04-01 MED ORDER — HEXACHLOROPHENE 3 % EX LIQD: CUTANEOUS | Status: DC

## 2013-04-01 NOTE — Progress Notes (Signed)
Patient ID: [name], male   DOB: October 27, 1936, 77 y.o.   MRN: [id number]  Lipids- not taking any meds  GERD- no sxs on meds  Mo0d- doing well on meds  bph-- doing well on hytrin  Reviewed pmh, psh, sochx  Reviewed meds   patient denies chest pain, shortness of breath, orthopnea. Denies lower extremity edema, abdominal pain, change in appetite, change in bowel movements. Patient denies rashes, musculoskeletal complaints. No other specific complaints in a complete review of systems.  He is exercising regularly.    well-developed well-nourished male in no acute distress. HEENT exam atraumatic, normocephalic, neck supple without jugular venous distention. Chest clear to auscultation cardiac exam S1-S2 are regular. Abdominal exam overweight with bowel sounds, soft and nontender. Extremities no edema. Neurologic exam is alert with a normal gait.

## 2013-04-01 NOTE — Telephone Encounter (Signed)
Pharm said phisohex is on back order from manufacture. Please advise

## 2013-04-03 ENCOUNTER — Other Ambulatory Visit: Payer: Self-pay | Admitting: Internal Medicine

## 2013-04-03 NOTE — Assessment & Plan Note (Signed)
Not requiring meds

## 2013-04-03 NOTE — Assessment & Plan Note (Signed)
No sxs on meds 

## 2013-04-03 NOTE — Assessment & Plan Note (Signed)
Doing well on current meds.

## 2013-04-07 NOTE — Telephone Encounter (Signed)
Will you call other pharmacies and see if they have the same problem? If they do, will you ask pharmacy what they are using instead

## 2013-04-08 NOTE — Telephone Encounter (Signed)
2 pharmacies confirmed on backorder.  [facility] said that they don't know of any prescription wise that it could replace but OTC pt could use betadine or hibiclens.

## 2013-04-09 NOTE — Telephone Encounter (Signed)
Call patient Use hibaclens

## 2013-04-09 NOTE — Telephone Encounter (Signed)
Pt aware, he will get that

## 2013-05-02 ENCOUNTER — Other Ambulatory Visit: Payer: Self-pay | Admitting: Internal Medicine

## 2013-06-03 ENCOUNTER — Other Ambulatory Visit: Payer: Self-pay | Admitting: Internal Medicine

## 2013-07-03 ENCOUNTER — Other Ambulatory Visit: Payer: Self-pay | Admitting: Internal Medicine

## 2013-07-03 NOTE — Telephone Encounter (Signed)
Rx last filled 06/03/13 #30 with 0 rf.  Pt last seen 6.2.14. Pls advise.

## 2013-07-29 ENCOUNTER — Other Ambulatory Visit: Payer: Self-pay | Admitting: Internal Medicine

## 2013-07-31 ENCOUNTER — Other Ambulatory Visit: Payer: Self-pay | Admitting: *Deleted

## 2013-07-31 MED ORDER — CITALOPRAM HYDROBROMIDE 20 MG PO TABS: ORAL_TABLET | ORAL | Status: DC

## 2013-10-14 ENCOUNTER — Ambulatory Visit (INDEPENDENT_AMBULATORY_CARE_PROVIDER_SITE_OTHER): Payer: Medicare Other | Admitting: Family

## 2013-10-14 ENCOUNTER — Encounter: Payer: Self-pay | Admitting: Family

## 2013-10-14 VITALS — BP 122/60 | HR 71 | Ht 67.0 in | Wt 149.0 lb

## 2013-10-14 DIAGNOSIS — Z23 Encounter for immunization: Secondary | ICD-10-CM

## 2013-10-14 DIAGNOSIS — Z0000 Encounter for general adult medical examination without abnormal findings: Secondary | ICD-10-CM

## 2013-10-14 DIAGNOSIS — N400 Enlarged prostate without lower urinary tract symptoms: Secondary | ICD-10-CM

## 2013-10-14 DIAGNOSIS — E7800 Pure hypercholesterolemia, unspecified: Secondary | ICD-10-CM

## 2013-10-14 MED ORDER — HYDROCORTISONE 2.5 % EX CREA: TOPICAL_CREAM | Freq: Two times a day (BID) | CUTANEOUS | Status: DC

## 2013-10-14 NOTE — Addendum Note (Signed)
Addended by: Beverely Low on: 10/14/2013 09:41 AM   Modules accepted: Orders

## 2013-10-14 NOTE — Patient Instructions (Signed)
Exercise to Stay Healthy Exercise helps you become and stay healthy. EXERCISE IDEAS AND TIPS Choose exercises that:  You enjoy.  Fit into your day. You do not need to exercise really hard to be healthy. You can do exercises at a slow or medium level and stay healthy. You can:  Stretch before and after working out.  Try yoga, Pilates, or tai chi.  Lift weights.  Walk fast, swim, jog, run, climb stairs, bicycle, dance, or rollerskate.  Take aerobic classes. Exercises that burn about 150 calories:  Running 1  miles in 15 minutes.  Playing volleyball for 45 to 60 minutes.  Washing and waxing a car for 45 to 60 minutes.  Playing touch football for 45 minutes.  Walking 1  miles in 35 minutes.  Pushing a stroller 1  miles in 30 minutes.  Playing basketball for 30 minutes.  Raking leaves for 30 minutes.  Bicycling 5 miles in 30 minutes.  Walking 2 miles in 30 minutes.  Dancing for 30 minutes.  Shoveling snow for 15 minutes.  Swimming laps for 20 minutes.  Walking up stairs for 15 minutes.  Bicycling 4 miles in 15 minutes.  Gardening for 30 to 45 minutes.  Jumping rope for 15 minutes.  Washing windows or floors for 45 to 60 minutes. Document Released: 11/19/2010 Document Revised: 01/09/2012 Document Reviewed: 11/19/2010 [employer] Patient Information 2014 [employer].  

## 2013-10-14 NOTE — Progress Notes (Signed)
Subjective:    Patient ID: [name], male    DOB: 1936/06/15, 77 y.o.   MRN: [id number]  HPI  77 year old male, nonsmoker, patient of Dr. Cato Mulligan,  presents for yearly preventative medicine examination. All immunizations and health maintenance protocols were reviewed with the patient and they are up to date with these protocols. Screening laboratory values were reviewed with the patient including screening of hyperlipidemia PSA renal function and hepatic function. There medications past medical history social history problem list and allergies were reviewed in detail.   Goals were established with regard to weight loss exercise diet in compliance with medications   Review of Systems  Constitutional: Negative.   HENT: Negative.   Eyes: Negative.   Respiratory: Negative.   Cardiovascular: Negative.   Gastrointestinal: Negative.   Genitourinary: Negative.   Musculoskeletal: Negative.   Skin: Negative.   Allergic/Immunologic: Negative.   Neurological: Negative.   Hematological: Negative.   Psychiatric/Behavioral: Negative.    Past Medical History  Diagnosis Date  . BPH (benign prostatic hypertrophy)   . GERD (gastroesophageal reflux disease)   . Anxiety   . Sleep apnea     History   Social History  . Marital Status: Married    Spouse Name: N/A    Number of Children: N/A  . Years of Education: N/A   Occupational History  . Not on file.   Social History Main Topics  . Smoking status: Former Games developer  . Smokeless tobacco: Not on file  . Alcohol Use: 0.6 oz/week    1 Shots of liquor per week  . Drug Use: No  . Sexual Activity: Not on file   Other Topics Concern  . Not on file   Social History Narrative  . No narrative on file    Past Surgical History  Procedure Laterality Date  . Nasal septoplasty w/ turbinoplasty  08/03/2012    Procedure: NASAL SEPTOPLASTY WITH TURBINATE REDUCTION;  Surgeon: Serena Colonel, MD;  Location: [facility] OR;  Service: ENT;  Laterality:  Bilateral;    No family history on file.  No Known Allergies  Current Outpatient Prescriptions on File Prior to Visit  Medication Sig Dispense Refill  . citalopram (CELEXA) 20 MG tablet take 1 tablet by mouth once daily  30 tablet  5  . cyanocobalamin 2000 MCG tablet Take 1 tablet (2,000 mcg total) by mouth daily.  30 tablet  11  . esomeprazole (NEXIUM) 40 MG capsule Take 40 mg by mouth daily before breakfast.       . terazosin (HYTRIN) 5 MG capsule take 1 capsule by mouth once daily  90 capsule  1  . aspirin 81 MG chewable tablet Chew 81 mg by mouth daily.      . hexachlorophene (PHISOHEX) 3 % liquid Use once weekly to scalp  473 mL  1   No current facility-administered medications on file prior to visit.    BP 122/60  Pulse 71  Ht 5\' 7"  (1.702 m)  Wt 149 lb (67.586 kg)  BMI 23.33 kg/m2chart    Objective:   Physical Exam  Constitutional: He is oriented to person, place, and time. He appears well-developed and well-nourished.  HENT:  Head: Normocephalic and atraumatic.  Right Ear: External ear normal.  Left Ear: External ear normal.  Nose: Nose normal.  Mouth/Throat: Oropharynx is clear and moist.  Eyes: Conjunctivae and EOM are normal. Pupils are equal, round, and reactive to light.  Neck: Normal range of motion. Neck supple. No thyromegaly present.  Cardiovascular: Normal rate, regular rhythm and normal heart sounds.   Pulmonary/Chest: Effort normal and breath sounds normal.  Abdominal: Soft. Bowel sounds are normal. He exhibits no distension. There is no tenderness.  Genitourinary: Rectum normal, prostate normal and penis normal. Guaiac negative stool. No penile tenderness.  Musculoskeletal: Normal range of motion. He exhibits no edema and no tenderness.  Neurological: He is alert and oriented to person, place, and time. He has normal reflexes. He displays normal reflexes. No cranial nerve deficit. Coordination normal.  Skin: Skin is warm and dry.  Psychiatric: He has a  normal mood and affect.          Assessment & Plan:  Assessment: 1. Complete physical exam 2. BPH 3. Hyperlipidemia  Plan: Patient will return for fasting labs to include lipids, LFTs, CBC, BMP will notify patient of the results. Encouraged healthy diet, exercise. Follow patient and the results of his labs, in one year and sooner as needed. Tetanus administered today.

## 2013-10-14 NOTE — Progress Notes (Signed)
Pre visit review using our clinic review tool, if applicable. No additional management support is needed unless otherwise documented below in the visit note. 

## 2013-10-17 ENCOUNTER — Other Ambulatory Visit (INDEPENDENT_AMBULATORY_CARE_PROVIDER_SITE_OTHER): Payer: Medicare Other

## 2013-10-17 DIAGNOSIS — Z0000 Encounter for general adult medical examination without abnormal findings: Secondary | ICD-10-CM

## 2013-10-17 DIAGNOSIS — E7800 Pure hypercholesterolemia, unspecified: Secondary | ICD-10-CM

## 2013-10-17 LAB — CBC WITH DIFFERENTIAL/PLATELET
Basophils Absolute: 0.1 10*3/uL (ref 0.0–0.1)
Eosinophils Absolute: 0.4 10*3/uL (ref 0.0–0.7)
Eosinophils Relative: 7.8 % — ABNORMAL HIGH (ref 0.0–5.0)
HCT: 39 % (ref 39.0–52.0)
Lymphs Abs: 1.4 10*3/uL (ref 0.7–4.0)
MCHC: 32.7 g/dL (ref 30.0–36.0)
MCV: 79.8 fl (ref 78.0–100.0)
Monocytes Absolute: 0.7 10*3/uL (ref 0.1–1.0)
Neutrophils Relative %: 55.1 % (ref 43.0–77.0)
Platelets: 218 10*3/uL (ref 150.0–400.0)
RBC: 4.88 Mil/uL (ref 4.22–5.81)
RDW: 14.3 % (ref 11.5–14.6)
WBC: 5.8 10*3/uL (ref 4.5–10.5)

## 2013-10-17 LAB — LIPID PANEL
Cholesterol: 197 mg/dL (ref 0–200)
LDL Cholesterol: 113 mg/dL — ABNORMAL HIGH (ref 0–99)
Triglycerides: 45 mg/dL (ref 0.0–149.0)

## 2013-10-17 LAB — BASIC METABOLIC PANEL
BUN: 14 mg/dL (ref 6–23)
CO2: 27 mEq/L (ref 19–32)
Chloride: 103 mEq/L (ref 96–112)
Creatinine, Ser: 0.8 mg/dL (ref 0.4–1.5)
GFR: 97.29 mL/min (ref >60.00)
Glucose, Bld: 101 mg/dL — ABNORMAL HIGH (ref 70–99)
Potassium: 4.4 mEq/L (ref 3.5–5.1)
Sodium: 135 mEq/L (ref 135–145)

## 2013-10-17 LAB — HEPATIC FUNCTION PANEL
ALT: 20 U/L (ref 0–53)
AST: 17 U/L (ref 0–37)
Alkaline Phosphatase: 44 U/L (ref 39–117)
Total Bilirubin: 0.9 mg/dL (ref 0.3–1.2)

## 2013-12-03 ENCOUNTER — Other Ambulatory Visit: Payer: Self-pay | Admitting: Internal Medicine

## 2014-01-27 ENCOUNTER — Other Ambulatory Visit: Payer: Self-pay | Admitting: Internal Medicine

## 2014-02-03 ENCOUNTER — Other Ambulatory Visit: Payer: Self-pay | Admitting: Internal Medicine

## 2014-06-26 ENCOUNTER — Encounter: Payer: Self-pay | Admitting: Family Medicine

## 2014-06-26 ENCOUNTER — Ambulatory Visit (INDEPENDENT_AMBULATORY_CARE_PROVIDER_SITE_OTHER): Payer: Medicare Other | Admitting: Family Medicine

## 2014-06-26 VITALS — BP 132/72 | HR 66 | Temp 98.0°F | Wt 144.0 lb

## 2014-06-26 DIAGNOSIS — R5383 Other fatigue: Principal | ICD-10-CM

## 2014-06-26 DIAGNOSIS — R5381 Other malaise: Secondary | ICD-10-CM

## 2014-06-26 DIAGNOSIS — R7309 Other abnormal glucose: Secondary | ICD-10-CM

## 2014-06-26 DIAGNOSIS — E538 Deficiency of other specified B group vitamins: Secondary | ICD-10-CM | POA: Insufficient documentation

## 2014-06-26 DIAGNOSIS — D649 Anemia, unspecified: Secondary | ICD-10-CM

## 2014-06-26 DIAGNOSIS — R7303 Prediabetes: Secondary | ICD-10-CM | POA: Insufficient documentation

## 2014-06-26 LAB — VITAMIN B12: Vitamin B-12: >1500 pg/mL — ABNORMAL HIGH (ref 211–911)

## 2014-06-26 NOTE — Progress Notes (Signed)
   Subjective:    Patient ID: [name], male    DOB: 21-May-1936, 78 y.o.   MRN: [id number]  HPI Seen with complaints of excessive fatigue the past few weeks. His chronic problems include history of BPH, reported dysthymic disorder, GERD, spinal stenosis, hyperlipidemia. He had several labs done at urologist including conference of metabolic panel, thyroid functions, and CBC. Hemoglobin 12.0 which is near his baseline. Glucose 102. Thyroid functions normal. He does have B12 deficiency and takes oral replacement. No recent B12 levels. He does have occasional lightheadedness. No exercise intolerance. No chest pains. No dyspnea. No fevers or chills. No dysuria. No dietary changes.  Past Medical History  Diagnosis Date  . BPH (benign prostatic hypertrophy)   . GERD (gastroesophageal reflux disease)   . Anxiety   . Sleep apnea    Past Surgical History  Procedure Laterality Date  . Nasal septoplasty w/ turbinoplasty  08/03/2012    Procedure: NASAL SEPTOPLASTY WITH TURBINATE REDUCTION;  Surgeon: Izora Gala, MD;  Location: [place];  Service: ENT;  Laterality: Bilateral;    reports that he has quit smoking. He does not have any smokeless tobacco history on file. He reports that he drinks about .6 ounces of alcohol per week. He reports that he does not use illicit drugs. family history is not on file. No Known Allergies    Review of Systems  Constitutional: Positive for fatigue. Negative for fever, chills, appetite change and unexpected weight change.  Respiratory: Negative for cough and shortness of breath.   Cardiovascular: Negative for chest pain, palpitations and leg swelling.  Gastrointestinal: Negative for abdominal pain.  Endocrine: Negative for polydipsia and polyuria.  Genitourinary: Negative for dysuria.  Neurological: Negative for dizziness and weakness.  Hematological: Negative for adenopathy.  Psychiatric/Behavioral: Negative for dysphoric mood.       Objective:   Physical  Exam  Constitutional: He appears well-developed and well-nourished.  HENT:  Mouth/Throat: Oropharynx is clear and moist.  Neck: Neck supple. No thyromegaly present.  Cardiovascular: Normal rate and regular rhythm.   Pulmonary/Chest: Effort normal and breath sounds normal. No respiratory distress. He has no wheezes. He has no rales.  Musculoskeletal: He exhibits no edema.  Lymphadenopathy:    He has no cervical adenopathy.  Neurological: He is alert.  Psychiatric: He has a normal mood and affect. His behavior is normal.          Assessment & Plan:  Fatigue. Recent labs as above per urology nonspecific and do not likely explain the fatigue issues. Has had hx of B12 difficiency with no recent levels. We'll recheck B12 level. He does not appear depressed. Appears to be getting adequate sleep. Has chronic mild normocytic anemia which is essentially unchanged. Appears to have prediabetes by previous labs and this is also unchanged.

## 2014-06-26 NOTE — Progress Notes (Signed)
Pre visit review using our clinic review tool, if applicable. No additional management support is needed unless otherwise documented below in the visit note. 

## 2014-06-27 ENCOUNTER — Telehealth: Payer: Self-pay | Admitting: Family Medicine

## 2014-06-27 NOTE — Telephone Encounter (Signed)
Pt informed

## 2014-06-27 NOTE — Telephone Encounter (Signed)
Pt returning your call about results  °

## 2014-07-11 ENCOUNTER — Encounter: Payer: Self-pay | Admitting: Family Medicine

## 2014-07-11 ENCOUNTER — Ambulatory Visit (INDEPENDENT_AMBULATORY_CARE_PROVIDER_SITE_OTHER): Payer: Medicare Other | Admitting: Family Medicine

## 2014-07-11 VITALS — BP 126/74 | HR 84 | Temp 98.2°F | Wt 145.0 lb

## 2014-07-11 DIAGNOSIS — R319 Hematuria, unspecified: Secondary | ICD-10-CM

## 2014-07-11 DIAGNOSIS — N3000 Acute cystitis without hematuria: Secondary | ICD-10-CM

## 2014-07-11 LAB — POCT URINALYSIS DIPSTICK
Bilirubin, UA: NEGATIVE
GLUCOSE UA: NEGATIVE
KETONES UA: NEGATIVE
Nitrite, UA: NEGATIVE
SPEC GRAV UA: 1.015
Urobilinogen, UA: 0.2
pH, UA: 8

## 2014-07-11 MED ORDER — CIPROFLOXACIN HCL 500 MG PO TABS: 500.0000 mg | ORAL_TABLET | Freq: Two times a day (BID) | ORAL | Status: DC

## 2014-07-11 NOTE — Patient Instructions (Signed)

## 2014-07-11 NOTE — Progress Notes (Signed)
   Subjective:    Patient ID: [name], male    DOB: 06/08/1936, 78 y.o.   MRN: [id number]  Dysuria  Associated symptoms include frequency. Pertinent negatives include no chills, flank pain, hematuria, nausea or vomiting.   Patient seen for acute visit. Couple day history of urine frequency and burning with urination. He apparently saw a urologist recently and had UTI. He was not sure if this was cultured. He is not sure which antibiotic he took but he did promptly improve. He does have history of BPH which is fairly well controlled with Hytrin. No flank pain. No nausea or vomiting.  Past Medical History  Diagnosis Date  . BPH (benign prostatic hypertrophy)   . GERD (gastroesophageal reflux disease)   . Anxiety   . Sleep apnea    Past Surgical History  Procedure Laterality Date  . Nasal septoplasty w/ turbinoplasty  08/03/2012    Procedure: NASAL SEPTOPLASTY WITH TURBINATE REDUCTION;  Surgeon: Izora Gala, MD;  Location: [place];  Service: ENT;  Laterality: Bilateral;    reports that he has quit smoking. He does not have any smokeless tobacco history on file. He reports that he drinks about .6 ounces of alcohol per week. He reports that he does not use illicit drugs. family history is not on file. No Known Allergies    Review of Systems  Constitutional: Negative for fever and chills.  Gastrointestinal: Negative for nausea, vomiting and abdominal pain.  Genitourinary: Positive for dysuria and frequency. Negative for hematuria, flank pain and decreased urine volume.  Neurological: Negative for weakness.       Objective:   Physical Exam  Constitutional: He appears well-developed and well-nourished.  Cardiovascular: Normal rate and regular rhythm.   Pulmonary/Chest: Effort normal and breath sounds normal. No respiratory distress. He has no wheezes. He has no rales.  Musculoskeletal:  No CVA tenderness  Neurological: He is alert.          Assessment & Plan:  Dysuria.  Suspect uncomplicated cystitis. Urine culture sent. Cipro 500 mg twice a day pending culture results. Push fluids. Followup promptly for any worsening symptoms

## 2014-07-11 NOTE — Progress Notes (Signed)
Pre visit review using our clinic review tool, if applicable. No additional management support is needed unless otherwise documented below in the visit note. 

## 2014-07-15 ENCOUNTER — Other Ambulatory Visit: Payer: Self-pay

## 2014-07-15 LAB — URINE CULTURE: Colony Count: >=100000

## 2014-07-15 MED ORDER — AMPICILLIN 500 MG PO CAPS: 500.0000 mg | ORAL_CAPSULE | Freq: Three times a day (TID) | ORAL | Status: DC

## 2014-07-28 ENCOUNTER — Other Ambulatory Visit: Payer: Self-pay | Admitting: Internal Medicine

## 2014-08-04 ENCOUNTER — Other Ambulatory Visit: Payer: Self-pay | Admitting: Internal Medicine

## 2014-08-06 IMAGING — CR DG CHEST 2V
2 series · 2 of 2 positions shown · non-contrast
Comparison: 03/25/2006

CLINICAL DATA: Preop.

CHEST - 2 VIEW

[view not recorded (1 of 2)]
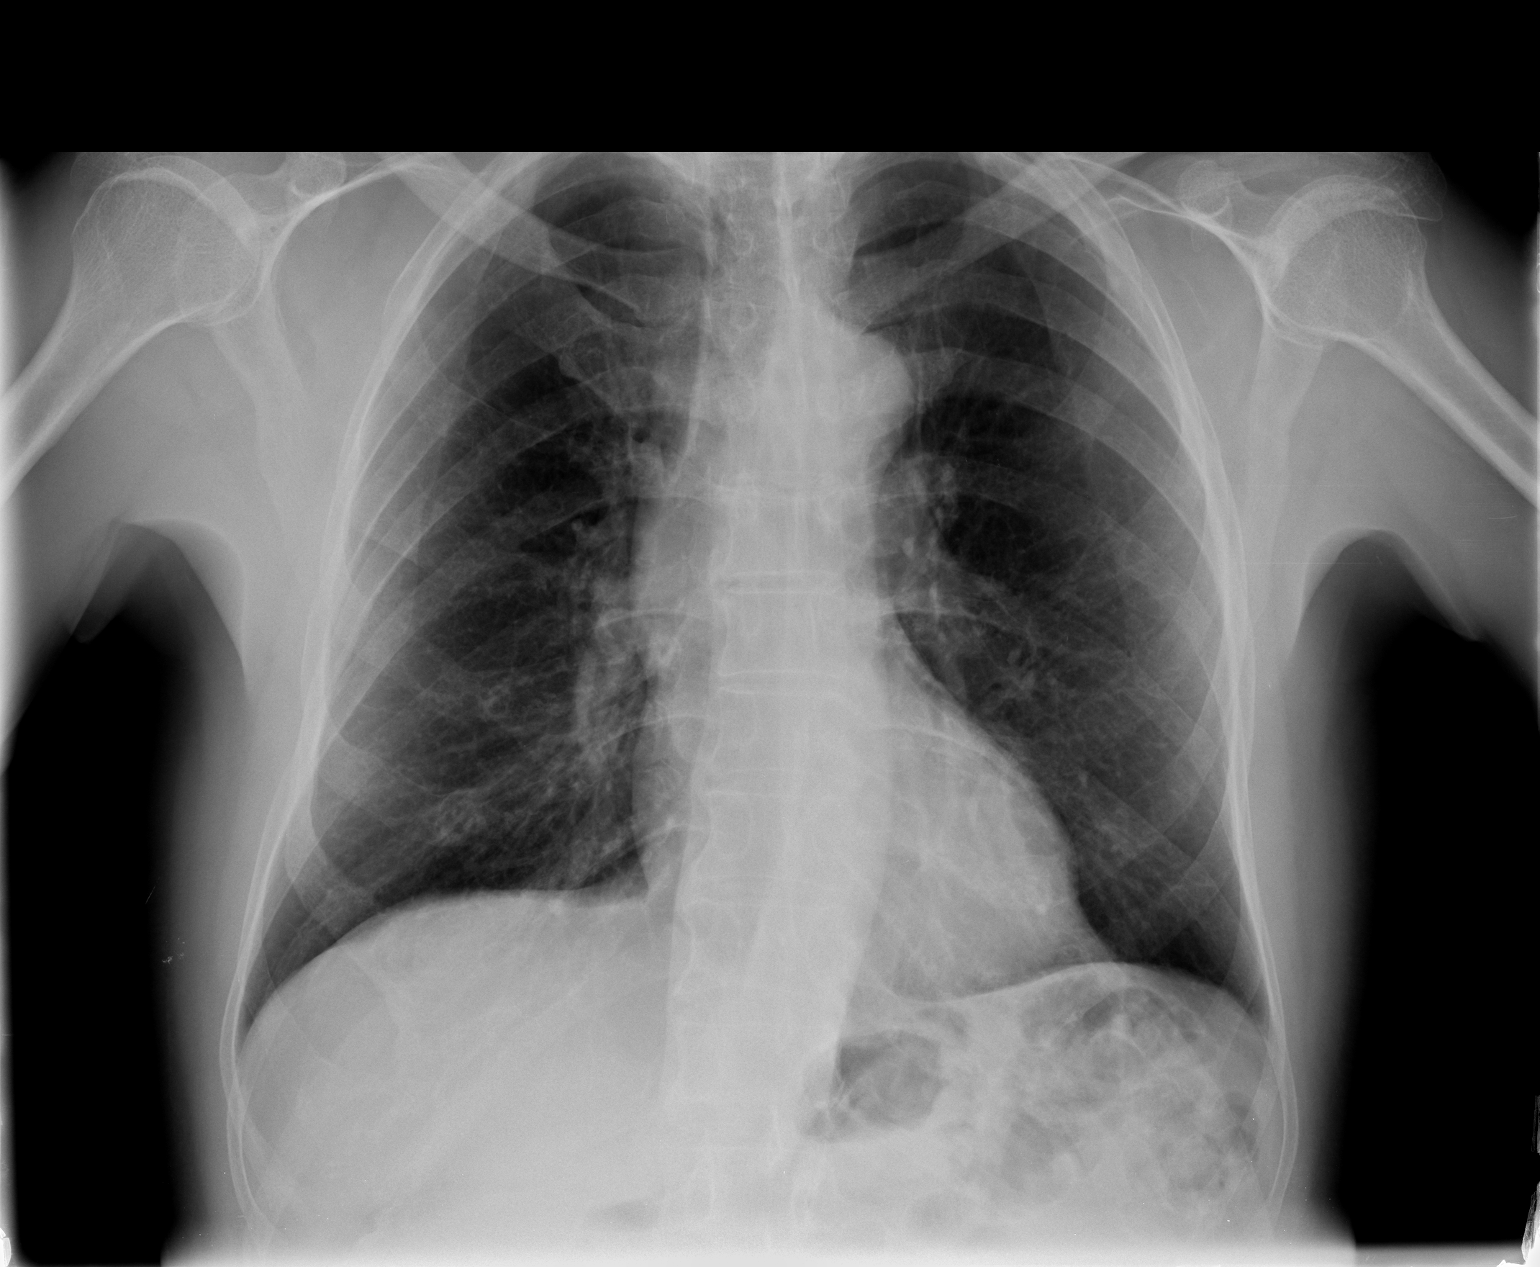

[view not recorded (2 of 2)]
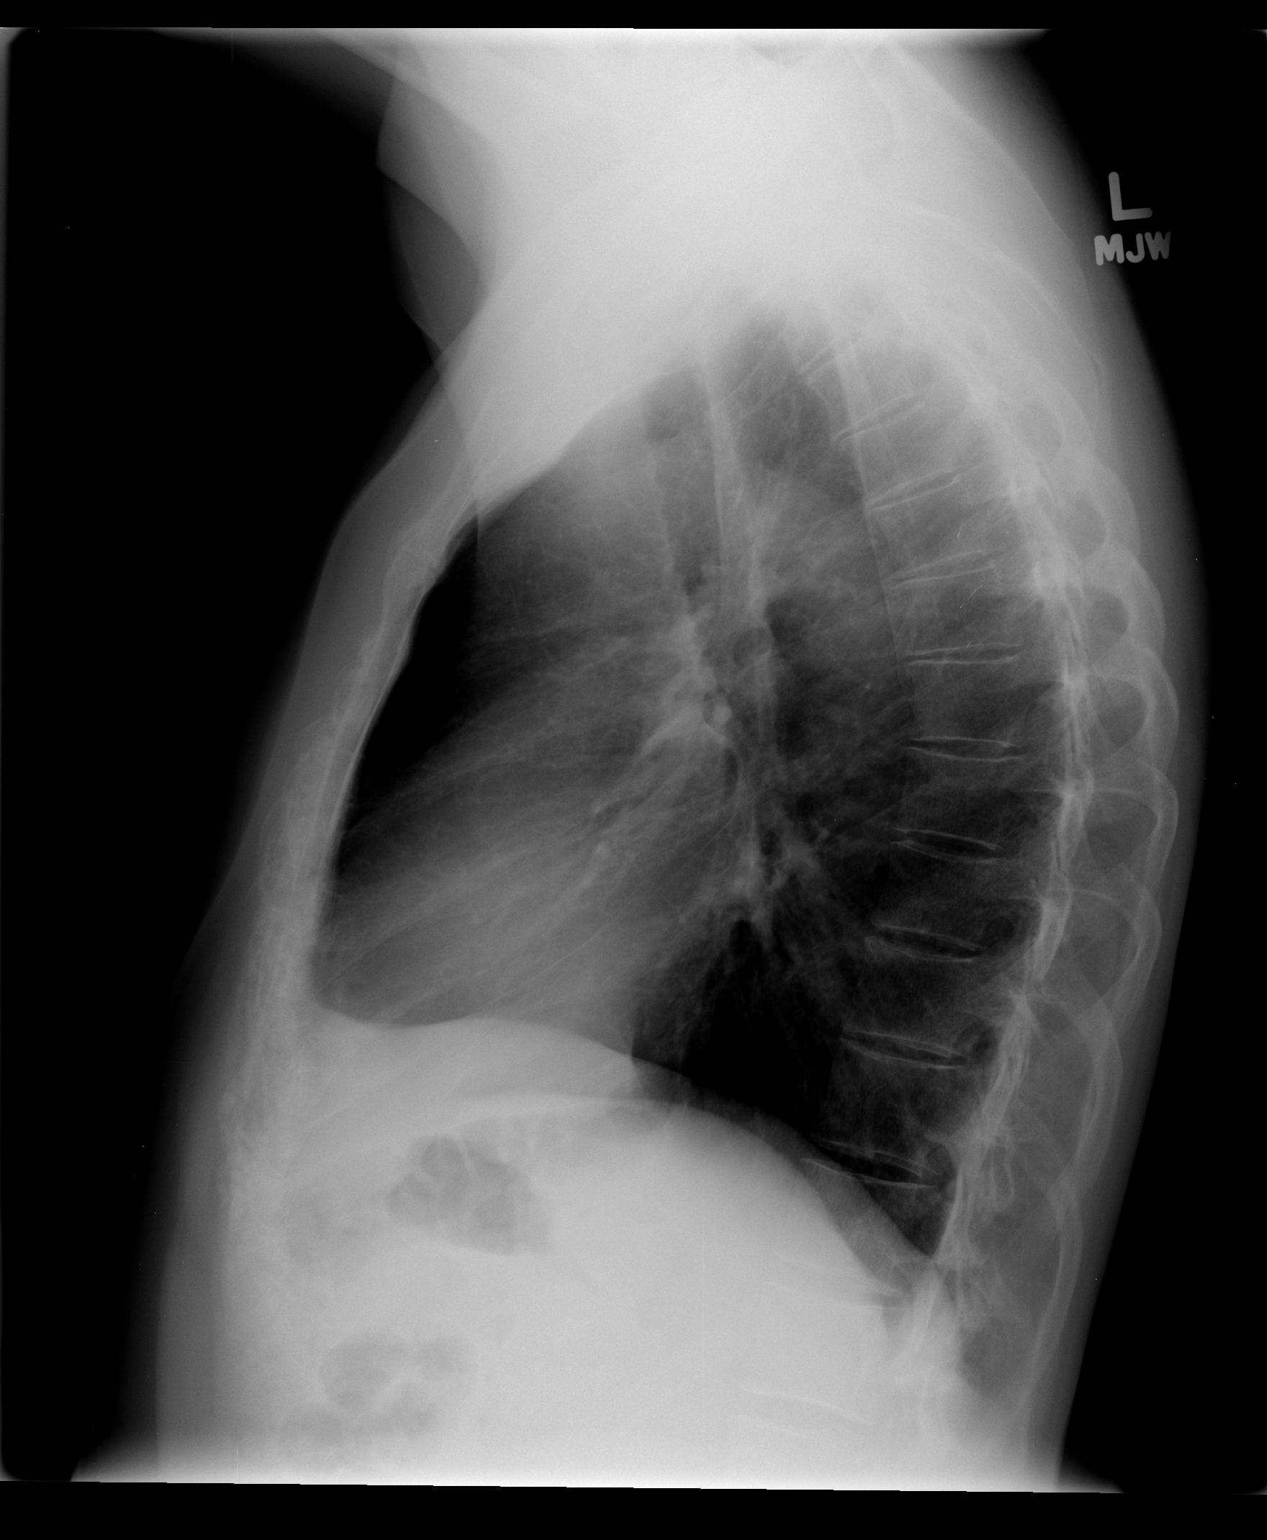

[2 of 2 positions shown; findings below may reference images not displayed]

FINDINGS: Heart and mediastinal contours are within normal limits.
No focal opacities or effusions.  No acute bony abnormality.
IMPRESSION: No active cardiopulmonary disease.

## 2014-08-29 ENCOUNTER — Encounter: Payer: Self-pay | Admitting: Family Medicine

## 2014-08-29 ENCOUNTER — Ambulatory Visit (INDEPENDENT_AMBULATORY_CARE_PROVIDER_SITE_OTHER): Payer: Medicare Other | Admitting: Family Medicine

## 2014-08-29 VITALS — BP 128/68 | HR 64 | Temp 97.7°F | Ht 67.0 in | Wt 142.0 lb

## 2014-08-29 DIAGNOSIS — N400 Enlarged prostate without lower urinary tract symptoms: Secondary | ICD-10-CM

## 2014-08-29 DIAGNOSIS — K219 Gastro-esophageal reflux disease without esophagitis: Secondary | ICD-10-CM

## 2014-08-29 DIAGNOSIS — Z0000 Encounter for general adult medical examination without abnormal findings: Secondary | ICD-10-CM

## 2014-08-29 DIAGNOSIS — F3342 Major depressive disorder, recurrent, in full remission: Secondary | ICD-10-CM

## 2014-08-29 DIAGNOSIS — Z23 Encounter for immunization: Secondary | ICD-10-CM

## 2014-08-29 NOTE — Progress Notes (Signed)
Pre visit review using our clinic review tool, if applicable. No additional management support is needed unless otherwise documented below in the visit note. 

## 2014-08-29 NOTE — Progress Notes (Addendum)
Subjective:    Patient ID: [name], male    DOB: 1936-02-06, 78 y.o.   MRN: [id number]  HPI  Patient seen for Medicare Wellness exam and medical follow-up. His chronic problems include history of BPH, dysthymia versus major depression, GERD, spinal stenosis. He is very active and plays golf 2 times a week and goes to the gym 3 times per week. He had flu vaccine. Tetanus up-to-date. Colonoscopy up-to-date. He sees urologist regularly and has had rectal exams and prostate exams regularly. He has not had Prevnar 13.  Medications reviewed. Compliant with all. He's been on Hytrin 5 mg daily at bedtime for several years and BPH symptoms stable. No orthostasis. Depression symptoms stable on Celexa. GERD stable on Nexium. Appetite and weight are stable. No recent chest pains. Nonsmoker. He did smoke briefly from his 64s to 74  Past Medical History  Diagnosis Date  . BPH (benign prostatic hypertrophy)   . GERD (gastroesophageal reflux disease)   . Anxiety   . Sleep apnea    Past Surgical History  Procedure Laterality Date  . Nasal septoplasty w/ turbinoplasty  08/03/2012    Procedure: NASAL SEPTOPLASTY WITH TURBINATE REDUCTION;  Surgeon: Izora Gala, MD;  Location: [place];  Service: ENT;  Laterality: Bilateral;    reports that he has quit smoking. He does not have any smokeless tobacco history on file. He reports that he drinks about .6 ounces of alcohol per week. He reports that he does not use illicit drugs. family history is not on file. No Known Allergies   1.  Risk factors based on Past Medical , Social, and Family history reviewed and as indicated above with no changes 2.  Limitations in physical activities None.  No recent falls. Plays golf several times per week and [employer] 3 times per week. 3.  Depression/mood No active depression or anxiety issues 4.  Hearing No defiits 5.  ADLs independent in all. 6.  Cognitive function (orientation to time and place, language, writing,  speech,memory) no short or long term memory issues.  Language and judgement intact. 7.  Home Safety no issues 8.  Height, weight, and visual acuity.all stable. 9.  Counseling discussed regular weight bearing exercise. 10. Recommendation of preventive services. Prevnar 13. 11. Labs based on risk factors none 12. Care Plan as above. 95. Other Providers [facility] 14. Written schedule of screening/prevention services given to patient.     Review of Systems  Constitutional: Negative for fever, activity change, appetite change and fatigue.  HENT: Negative for congestion, ear pain and trouble swallowing.   Eyes: Negative for pain and visual disturbance.  Respiratory: Negative for cough, shortness of breath and wheezing.   Cardiovascular: Negative for chest pain and palpitations.  Gastrointestinal: Negative for nausea, vomiting, abdominal pain, diarrhea, constipation, blood in stool, abdominal distention and rectal pain.  Genitourinary: Negative for dysuria, hematuria and testicular pain.  Musculoskeletal: Positive for back pain. Negative for arthralgias and joint swelling.  Skin: Negative for rash.  Neurological: Negative for dizziness, syncope and headaches.  Hematological: Negative for adenopathy.  Psychiatric/Behavioral: Negative for confusion and dysphoric mood.       Objective:   Physical Exam  Constitutional: He is oriented to person, place, and time. He appears well-developed and well-nourished.  HENT:  Right Ear: External ear normal.  Left Ear: External ear normal.  Mouth/Throat: Oropharynx is clear and moist.  Neck: Neck supple. No thyromegaly present.  Cardiovascular: Normal rate and regular rhythm.   Pulmonary/Chest: Effort normal  and breath sounds normal. No respiratory distress. He has no wheezes. He has no rales.  Abdominal: Soft. Bowel sounds are normal. He exhibits no distension and no mass. There is no tenderness. There is no rebound and no guarding.    Musculoskeletal: He exhibits no edema.  Lymphadenopathy:    He has no cervical adenopathy.  Neurological: He is alert and oriented to person, place, and time. No cranial nerve deficit.  Skin: No rash noted.  Psychiatric: He has a normal mood and affect. His behavior is normal.          Assessment & Plan:  #1 health maintenance. Patient has already had multiple screening labs during the past year, so none were obtained. He already had flu vaccine. Prevnar 13 given. Colonoscopy up-to-date. Shingles vaccine up-to-date. Tetanus vaccine up-to-date. Continue regular active lifestyle habits. #2 history of BPH. Symptomatically stable #3 history of GERD stable on Nexium. Recent B12 level normal #4 history of depression stable on citalopram 20 mg daily #5 history of lumbar stenosis. He has intermittent back pain but this is not limiting his walking or activities otherwise.

## 2014-08-29 NOTE — Patient Instructions (Signed)
Continue with yearly flu vaccine 

## 2014-09-23 ENCOUNTER — Telehealth: Payer: Self-pay | Admitting: Family Medicine

## 2014-09-23 NOTE — Telephone Encounter (Signed)
Pt is very dizzy and this has been going on over a week. Pt refused triage nurse, refused to be seen at another location. wife insisted I ask if dr Elease Hashimoto would work him in today.  Refused appt on wed as well.  pls advise.

## 2014-09-23 NOTE — Telephone Encounter (Signed)
Pt states that he is okay with coming in tomorrow. Appointment.

## 2014-09-24 ENCOUNTER — Encounter: Payer: Self-pay | Admitting: Family Medicine

## 2014-09-24 ENCOUNTER — Ambulatory Visit (INDEPENDENT_AMBULATORY_CARE_PROVIDER_SITE_OTHER): Payer: Medicare Other | Admitting: Family Medicine

## 2014-09-24 VITALS — BP 130/74 | HR 71 | Temp 97.5°F | Wt 146.0 lb

## 2014-09-24 DIAGNOSIS — R42 Dizziness and giddiness: Secondary | ICD-10-CM

## 2014-09-24 NOTE — Progress Notes (Signed)
   Subjective:    Patient ID: [name], male    DOB: 1936-10-16, 78 y.o.   MRN: [id number]  HPI Acute visit for new problem of dizziness. Onset about one week ago. He was getting out of bed and noticed what sounded like vertigo. He did actually lose his balance and fell foreward and hit his right frontal region but no loss of consciousness. No headaches. He had nausea first day but none since then. His symptoms are consistently worse in the morning and improved as the day unfolds. He has not had any ataxia, speech changes or swallowing difficulties.  Recently saw gastroenterologist with frequent gaseous symptoms and history of mild anemia. Lab work was unremarkable. These were reviewed.  Past Medical History  Diagnosis Date  . BPH (benign prostatic hypertrophy)   . GERD (gastroesophageal reflux disease)   . Anxiety   . Sleep apnea    Past Surgical History  Procedure Laterality Date  . Nasal septoplasty w/ turbinoplasty  08/03/2012    Procedure: NASAL SEPTOPLASTY WITH TURBINATE REDUCTION;  Surgeon: Izora Gala, MD;  Location: [place];  Service: ENT;  Laterality: Bilateral;    reports that he has quit smoking. He does not have any smokeless tobacco history on file. He reports that he drinks about 0.6 oz of alcohol per week. He reports that he does not use illicit drugs. family history is not on file. No Known Allergies    Review of Systems  Constitutional: Negative for fever, chills, appetite change and unexpected weight change.  Respiratory: Negative for cough and shortness of breath.   Cardiovascular: Negative for chest pain.  Neurological: Positive for dizziness. Negative for seizures, syncope and weakness.  Psychiatric/Behavioral: Negative for confusion.       Objective:   Physical Exam  Constitutional: He is oriented to person, place, and time. He appears well-developed and well-nourished. No distress.  HENT:  Right Ear: External ear normal.  Left Ear: External ear  normal.  Eyes: Pupils are equal, round, and reactive to light.  Neck: Neck supple.  Cardiovascular: Normal rate and regular rhythm.  Exam reveals no gallop.   Pulmonary/Chest: Effort normal and breath sounds normal. No respiratory distress. He has no wheezes. He has no rales.  Musculoskeletal: He exhibits no edema.  Lymphadenopathy:    He has no cervical adenopathy.  Neurological: He is alert and oriented to person, place, and time. No cranial nerve deficit.  No focal strength deficits. Gait normal.          Assessment & Plan:  Vertigo. Nonfocal exam. Suspect benign peripheral positional vertigo. His symptoms seem to be gradually improving. Observation and consider vestibular rehabilitation if not resolving over the next week

## 2014-09-24 NOTE — Progress Notes (Signed)
Pre visit review using our clinic review tool, if applicable. No additional management support is needed unless otherwise documented below in the visit note. 

## 2014-09-24 NOTE — Patient Instructions (Signed)
Benign Positional Vertigo Vertigo means you feel like you or your surroundings are moving when they are not. Benign positional vertigo is the most common form of vertigo. Benign means that the cause of your condition is not serious. Benign positional vertigo is more common in older adults. CAUSES  Benign positional vertigo is the result of an upset in the labyrinth system. This is an area in the middle ear that helps control your balance. This may be caused by a viral infection, head injury, or repetitive motion. However, often no specific cause is found. SYMPTOMS  Symptoms of benign positional vertigo occur when you move your head or eyes in different directions. Some of the symptoms may include:  Loss of balance and falls.  Vomiting.  Blurred vision.  Dizziness.  Nausea.  Involuntary eye movements (nystagmus). DIAGNOSIS  Benign positional vertigo is usually diagnosed by physical exam. If the specific cause of your benign positional vertigo is unknown, your caregiver may perform imaging tests, such as magnetic resonance imaging (MRI) or computed tomography (CT). TREATMENT  Your caregiver may recommend movements or procedures to correct the benign positional vertigo. Medicines such as meclizine, benzodiazepines, and medicines for nausea may be used to treat your symptoms. In rare cases, if your symptoms are caused by certain conditions that affect the inner ear, you may need surgery. HOME CARE INSTRUCTIONS   Follow your caregiver's instructions.  Move slowly. Do not make sudden body or head movements.  Avoid driving.  Avoid operating heavy machinery.  Avoid performing any tasks that would be dangerous to you or others during a vertigo episode.  Drink enough fluids to keep your urine clear or pale yellow. SEEK IMMEDIATE MEDICAL CARE IF:   You develop problems with walking, weakness, numbness, or using your arms, hands, or legs.  You have difficulty speaking.  You develop  severe headaches.  Your nausea or vomiting continues or gets worse.  You develop visual changes.  Your family or friends notice any behavioral changes.  Your condition gets worse.  You have a fever.  You develop a stiff neck or sensitivity to light. MAKE SURE YOU:   Understand these instructions.  Will watch your condition.  Will get help right away if you are not doing well or get worse. Document Released: 07/25/2006 Document Revised: 01/09/2012 Document Reviewed: 07/07/2011 Exit[employer] Patient Information 2015 [employer]. This information is not intended to replace advice given to you by your health care provider. Make sure you discuss any questions you have with your health care provider.    

## 2014-11-17 DIAGNOSIS — R351 Nocturia: Secondary | ICD-10-CM | POA: Diagnosis not present

## 2014-11-17 DIAGNOSIS — N401 Enlarged prostate with lower urinary tract symptoms: Secondary | ICD-10-CM | POA: Diagnosis not present

## 2014-12-04 ENCOUNTER — Other Ambulatory Visit: Payer: Self-pay | Admitting: Family Medicine

## 2015-01-04 ENCOUNTER — Other Ambulatory Visit: Payer: Self-pay | Admitting: Internal Medicine

## 2015-01-22 ENCOUNTER — Other Ambulatory Visit: Payer: Self-pay | Admitting: Family Medicine

## 2015-03-23 DIAGNOSIS — Z0100 Encounter for examination of eyes and vision without abnormal findings: Secondary | ICD-10-CM | POA: Diagnosis not present

## 2015-05-10 DIAGNOSIS — N390 Urinary tract infection, site not specified: Secondary | ICD-10-CM | POA: Diagnosis not present

## 2015-05-15 DIAGNOSIS — R339 Retention of urine, unspecified: Secondary | ICD-10-CM | POA: Diagnosis not present

## 2015-05-15 DIAGNOSIS — N390 Urinary tract infection, site not specified: Secondary | ICD-10-CM | POA: Diagnosis not present

## 2015-05-15 DIAGNOSIS — R3911 Hesitancy of micturition: Secondary | ICD-10-CM | POA: Diagnosis not present

## 2015-05-15 DIAGNOSIS — R351 Nocturia: Secondary | ICD-10-CM | POA: Diagnosis not present

## 2015-05-20 DIAGNOSIS — N401 Enlarged prostate with lower urinary tract symptoms: Secondary | ICD-10-CM | POA: Diagnosis not present

## 2015-05-20 DIAGNOSIS — R351 Nocturia: Secondary | ICD-10-CM | POA: Diagnosis not present

## 2015-05-20 DIAGNOSIS — R339 Retention of urine, unspecified: Secondary | ICD-10-CM | POA: Diagnosis not present

## 2015-05-28 ENCOUNTER — Other Ambulatory Visit: Payer: Self-pay | Admitting: Family Medicine

## 2015-07-13 ENCOUNTER — Other Ambulatory Visit: Payer: Self-pay | Admitting: Family Medicine

## 2015-08-31 ENCOUNTER — Encounter: Payer: Self-pay | Admitting: Family Medicine

## 2015-09-08 ENCOUNTER — Encounter: Payer: Self-pay | Admitting: Family Medicine

## 2015-09-08 ENCOUNTER — Ambulatory Visit (INDEPENDENT_AMBULATORY_CARE_PROVIDER_SITE_OTHER): Payer: Medicare Other | Admitting: Family Medicine

## 2015-09-08 VITALS — BP 140/78 | HR 70 | Temp 98.1°F | Resp 18 | Ht 67.0 in | Wt 148.0 lb

## 2015-09-08 DIAGNOSIS — Z0000 Encounter for general adult medical examination without abnormal findings: Secondary | ICD-10-CM | POA: Diagnosis not present

## 2015-09-08 MED ORDER — HYDROCORTISONE 2.5 % EX CREA: TOPICAL_CREAM | Freq: Two times a day (BID) | CUTANEOUS | Status: DC

## 2015-09-08 NOTE — Progress Notes (Signed)
Pre visit review using our clinic review tool, if applicable. No additional management support is needed unless otherwise documented below in the visit note. 

## 2015-09-08 NOTE — Patient Instructions (Signed)
Monitor blood pressure and be in touch if consistently > 150/90 Continue with yearly flu vaccine You will not need tetanus until 2024.

## 2015-09-08 NOTE — Progress Notes (Signed)
   Subjective:    Patient ID: [name], male    DOB: [birth year], 79 y.o.   MRN: [id number]  HPI Patient seen for physical exam and medical follow-up. Has history of BPH which is stable on Hytrin. He has history of GERD which is stable on Nexium. He has history of mild normocytic anemia and B12 deficiency. He takes oral B12 replacement. He has spinal stenosis but no recent progressive symptoms. He ambulates frequently with walking for exercise. His immunizations are up-to-date. Colonoscopy up-to-date.  Past Medical History  Diagnosis Date  . BPH (benign prostatic hypertrophy)   . GERD (gastroesophageal reflux disease)   . Anxiety   . Sleep apnea    Past Surgical History  Procedure Laterality Date  . Nasal septoplasty w/ turbinoplasty  08/03/2012    Procedure: NASAL SEPTOPLASTY WITH TURBINATE REDUCTION;  Surgeon: Izora Gala, MD;  Location: [place];  Service: ENT;  Laterality: Bilateral;    reports that he has quit smoking. He does not have any smokeless tobacco history on file. He reports that he drinks about 0.6 oz of alcohol per week. He reports that he does not use illicit drugs. family history is not on file. No Known Allergies   Review of Systems  Constitutional: Negative for appetite change, fatigue and unexpected weight change.  HENT: Negative for trouble swallowing.   Eyes: Negative for visual disturbance.  Respiratory: Negative for cough, chest tightness and shortness of breath.   Cardiovascular: Negative for chest pain, palpitations and leg swelling.  Gastrointestinal: Negative for nausea, vomiting, abdominal pain, diarrhea and blood in stool.  Endocrine: Negative for polydipsia and polyuria.  Genitourinary: Negative for dysuria.  Musculoskeletal: Positive for back pain.  Neurological: Negative for dizziness, syncope, weakness, light-headedness and headaches.       Objective:   Physical Exam  Constitutional: He is oriented to person, place, and time. He appears  well-developed and well-nourished. No distress.  HENT:  Head: Normocephalic and atraumatic.  Right Ear: External ear normal.  Left Ear: External ear normal.  Mouth/Throat: Oropharynx is clear and moist.  Eyes: Conjunctivae and EOM are normal. Pupils are equal, round, and reactive to light.  Neck: Normal range of motion. Neck supple. No thyromegaly present.  Cardiovascular: Normal rate, regular rhythm and normal heart sounds.   No murmur heard. Pulmonary/Chest: No respiratory distress. He has no wheezes. He has no rales.  Abdominal: Soft. Bowel sounds are normal. He exhibits no distension and no mass. There is no tenderness. There is no rebound and no guarding.  Musculoskeletal: He exhibits no edema.  Lymphadenopathy:    He has no cervical adenopathy.  Neurological: He is alert and oriented to person, place, and time. He displays normal reflexes. No cranial nerve deficit.  Skin: No rash noted.  Psychiatric: He has a normal mood and affect.          Assessment & Plan:   #1 Physical exam. Vaccines up-to-date. Colonoscopy up-to-date. Continue yearly flu vaccine. #2 GERD stable on Nexium #3 history of BPH stable on Hytrin

## 2015-12-03 ENCOUNTER — Other Ambulatory Visit: Payer: Self-pay | Admitting: Family Medicine

## 2015-12-24 DIAGNOSIS — K219 Gastro-esophageal reflux disease without esophagitis: Secondary | ICD-10-CM | POA: Diagnosis not present

## 2015-12-24 DIAGNOSIS — Z86010 Personal history of colonic polyps: Secondary | ICD-10-CM | POA: Diagnosis not present

## 2016-01-12 ENCOUNTER — Other Ambulatory Visit: Payer: Self-pay | Admitting: Family Medicine

## 2016-01-12 NOTE — Telephone Encounter (Signed)
Request is for out of state.  Not sure if the pt has moved.

## 2016-01-13 MED ORDER — TERAZOSIN HCL 5 MG PO CAPS: 5.0000 mg | ORAL_CAPSULE | Freq: Every day | ORAL | Status: DC

## 2016-05-12 DIAGNOSIS — D509 Iron deficiency anemia, unspecified: Secondary | ICD-10-CM | POA: Diagnosis not present

## 2016-05-12 DIAGNOSIS — R194 Change in bowel habit: Secondary | ICD-10-CM | POA: Diagnosis not present

## 2016-05-12 DIAGNOSIS — R140 Abdominal distension (gaseous): Secondary | ICD-10-CM | POA: Diagnosis not present

## 2016-06-03 ENCOUNTER — Other Ambulatory Visit: Payer: Self-pay | Admitting: Family Medicine

## 2016-06-16 NOTE — Progress Notes (Addendum)
Subjective:   [name] is a 80 y.o. male who presents for Medicare Annual (Subsequent) preventive examination.  HRA assessment completed during this visit with [name]   The Patient was informed that the wellness visit is to identify future health risk and educate and initiate measures that can reduce risk for increased disease through the lifespan.    NO ROS; Medicare Wellness Visit CPE due 08/2016- will make apt for CPE prior to leaving today  Dr. Collene Mares did labs; blood count;/ will bring in a copy of the labs she did  Slight elevated sugar level- Does know numbers Slightly anemic; not sure how low;  Takes B12 po  Will have annual labs at [facility] is good;  But anxiety problems x 2 weeks ago ; much better now Started having panic attacks/ Son is a Teacher, music and nephew is a Investment banker, operational and called the later;  Working deep breathing into every day which helps Slight depression; states he recently moved and has a nice home; but misses the old home; as well as son in [place] moved to [place] and he misses him and the grandkids.   Psychosocial: Lives with spouse; no disability  2 sons; Doctor lives in [place] Younger son moved to [place]  2 brothers in [place]   Tobacco minimal use x 30 yo ETOH ; occoasional  Medications reviewed for issues; compliance; otc meds Is to monitor his BP and call if over 150/90 Good today/   BMI: 23.2  Diet:  Appetite; In am am toast and egg Lunch; sometimes protein and salad Dinner; more vegetables Rice and bread; and fruit   Dental work: every 6 months   Exercise;  plays golf x 2 per week Gym; 3 times x 45 to 60 minutes Weights; cardio  Meds: not taking asa and was told he was "slightly anemic" and will defer to Dr. Sheldon Silvan  Stopped Nexium and is taking omeprazole by GI  HOME SAFETY Long term plan reviewed  Home: level; barriers; or needs identified as bathroom railing or other review; New home moved; 2 levels but master  downstairs Fall hx; no Fear of falling; walk in shower  Given education on "Fall Prevention in the Home" for more safety tips the patient can apply as appropriate.   Personal safety issues reviewed:  1.  for risk such as safe community 2.  smoke detector 3.  firearms safety if applicable  4. protection when in the sun; does wear sunscreen; and golf hat and sunglasses  5. driving safety for seniors or any recent accidents. no   Risk for Depression reviewed: Any emotional problems? Anxious, depressed, irritable, sad or blue? Some anxiousness but no depressoin Denies feeling depressed or hopeless; voices pleasure in daily life How many social activities have you been engaged in within the last 2 weeks? No issues  misses grandkids 4 and other older; States he will join club and try to be more social   Cognitive; no  Manages checkbook, medications; no failures of task Ad8 score reviewed for issues;  Issues making decisions; no  Less interest in hobbies / activities" no  Repeats questions, stories; family complaining: NO  Trouble using ordinary gadgets; microwave; computer: no  Forgets the month or year: no  Mismanaging finances: no  Missing apt: no but does write them down  Daily problems with thinking of memory NO Ad8 score is 0  MMSE not appropriate unless AD8 score is > 2   Advanced Directive addressed;  please bring a copy when you come    Counseling Health Maintenance Gaps: Flu shot when available  Takes in October; will call and let us know when he takes it if not taken in the office   Colonoscopy; 08/17/2011 / 07/2021 if repeated  EKG:  07/2012 Prostate cancer screening: defer at this time  Lipids 2014; Trig 45; HDL 74; LDL 113;    Hearing: 4000hz  - right  Left; 1000 hz  Will make apt for Dr. Elease Hashimoto to check left ear    Ophthalmology exam; x 1 .80 yo; apt in 2 weeks [facility] Asso No issues   Immunizations Due: (Vaccines reviewed and educated  regarding any overdue)    Established and updated Risk reviewed and appropriate referral made or health recommendations:  Current Care Team reviewed and updated Cardiac Risk Factors include: advanced age (>74men, >44 women);hypertension     Objective:     Vitals: BP 120/60   Pulse 74   Ht 5\' 7"  (1.702 m)   Wt 144 lb 7 oz (65.5 kg)   SpO2 98%   BMI 22.62 kg/m   Body mass index is 22.62 kg/m.   Tobacco History  Smoking Status  . Former Smoker  Smokeless Tobacco  . Never Used    Comment: rare use over 50 years      Counseling given: Not Answered smoked x 50 years ago; rare use   Past Medical History:  Diagnosis Date  . Anxiety   . BPH (benign prostatic hypertrophy)   . GERD (gastroesophageal reflux disease)   . Sleep apnea    Past Surgical History:  Procedure Laterality Date  . NASAL SEPTOPLASTY W/ TURBINOPLASTY  08/03/2012   Procedure: NASAL SEPTOPLASTY WITH TURBINATE REDUCTION;  Surgeon: Izora Gala, MD;  Location: [facility];  Service: ENT;  Laterality: Bilateral;   No family history on file. History  Sexual Activity  . Sexual activity: Not on file    Outpatient Encounter Prescriptions as of 06/17/2016  Medication Sig  . aspirin 81 MG chewable tablet Chew 81 mg by mouth daily.  . citalopram (CELEXA) 20 MG tablet take 1 tablet by mouth daily  . hydrocortisone 2.5 % cream Apply topically 2 (two) times daily.  Marland Kitchen omeprazole (PRILOSEC) 40 MG capsule Take 40 mg by mouth daily.  Marland Kitchen RA VITAMIN B12 2000 MCG TBCR take 1 tablet by mouth once daily  . terazosin (HYTRIN) 5 MG capsule Take 1 capsule (5 mg total) by mouth daily.  Marland Kitchen esomeprazole (NEXIUM) 40 MG capsule Take 40 mg by mouth daily before breakfast.    No facility-administered encounter medications on file as of 06/17/2016.     Activities of Daily Living In your present state of health, do you have any difficulty performing the following activities: 06/17/2016 09/08/2015  Hearing? (No Data) N  Vision? N N    Difficulty concentrating or making decisions? N N  Walking or climbing stairs? N N  Dressing or bathing? N N  Doing errands, shopping? N N  Preparing Food and eating ? N -  Using the Toilet? N -  In the past six months, have you accidently leaked urine? N -  Do you have problems with loss of bowel control? N -  Managing your Medications? N -  Managing your Finances? N -  Housekeeping or managing your Housekeeping? N -  Some recent data might be hidden    Patient Care Team: [name], MD as PCP - General (Family Medicine)    Assessment:  Exercise Activities and Dietary recommendations Current Exercise Habits: Structured exercise class;Home exercise routine, Type of exercise: walking;strength training/weights, Time (Minutes): 60, Frequency (Times/Week): 4, Weekly Exercise (Minutes/Week): 240, Intensity: Moderate  Goals    . patient          More engaged in social event  More exercise / more outings      Fall Risk Fall Risk  06/17/2016 09/08/2015 09/08/2015 08/29/2014 10/14/2013  Falls in the past year? No No No No No   Depression Screen PHQ 2/9 Scores 06/17/2016 09/08/2015 09/08/2015 08/29/2014  PHQ - 2 Score 0 0 0 0     Cognitive Testing MMSE - Mini Mental State Exam 06/17/2016  Not completed: (No Data)    Immunization History  Administered Date(s) Administered  . Influenza Split 12/10/2011, 07/09/2012  . Influenza Whole 08/11/2008, 07/31/2009  . Influenza, High Dose Seasonal PF 07/31/2013, 08/24/2014, 07/10/2015  . Pneumococcal Conjugate-13 08/29/2014  . Pneumococcal Polysaccharide-23 01/21/2005  . Td 04/10/2003, 10/14/2013  . Zoster 11/01/2011   Screening Tests Health Maintenance  Topic Date Due  . INFLUENZA VACCINE  05/31/2016  . TETANUS/TDAP  10/15/2023  . ZOSTAVAX  Completed  . PNA vac Low Risk Adult  Completed      PLAN:  To make apt for CPE with Dr. Elease Hashimoto Will check left ear (hearing screen 500 in left) Will try to get any recent  labs from Dr. Collene Mares. Slight anemic and slightly elevated sugar  Will bring a copy of the HCPOA Eye exam scheduled x 2 weeks  Recent episode of anxiety now under control with DB exercise Psych and Psychologist in the family    During the course of the visit the patient was educated and counseled about the following appropriate screening and preventive services:   Vaccines to include Pneumoccal, Influenza, Hepatitis B, Td, Zostavax, HCV  Electrocardiogram  Cardiovascular Disease  Colorectal cancer screening  PSA  Diabetes screening  Glaucoma screening  Nutrition counseling   Patient Instructions (the written plan) was given to the patient.   [id number], RN  06/17/2016  Above notes reviewed. Agree with assessment and plan as per Orvan Falconer MD [facility]

## 2016-06-17 ENCOUNTER — Ambulatory Visit (INDEPENDENT_AMBULATORY_CARE_PROVIDER_SITE_OTHER): Payer: Medicare Other

## 2016-06-17 VITALS — BP 120/60 | HR 74 | Ht 67.0 in | Wt 144.4 lb

## 2016-06-17 DIAGNOSIS — Z0000 Encounter for general adult medical examination without abnormal findings: Secondary | ICD-10-CM

## 2016-06-17 NOTE — Patient Instructions (Addendum)
[name] , Thank you for taking time to come for your Medicare Wellness Visit. I appreciate your ongoing commitment to your health goals. Please review the following plan we discussed and let me know if I can assist you in the future.   Eye check in 2 weeks  Will call with flu shot if you do not take it here;  Will bring a copy of HCPOA   Will make apt with Dr. Sheldon Silvan for CPE For blood review health issues  Educated regarding prediabetes and numbers;  A1c ranges from 5.8 to 6.5 or fasting Blood sugar > 115 -126; (126 is diabetic)   Risk: >45yo; family hx; overweight or obese; African American; Hispanic; Latino; American [place]; [place] American; [place]; history of diabetes when pregnant; or birth to a baby weighing over 9 lbs. Being less physically active than 30 minutes; 3 times a week;   Prevention; Losing a modest 7 to 8 lbs; If over 200 lbs; 10 to 14 lbs;  Choose healthier foods; colorful veggies; fish or lean meats; drinks water Reduce portion size Start exercising; 30 minutes of fast walking x 30 minutes per day/ 60 min for weight loss     These are the goals we discussed: Goals    . patient          More engaged in social event  More exercise / more outings       This is a list of the screening recommended for you and due dates:  Health Maintenance  Topic Date Due  . Flu Shot  05/31/2016  . Tetanus Vaccine  10/15/2023  . Shingles Vaccine  Completed  . Pneumonia vaccines  Completed    Screening for Type 2 Diabetes Screening is a way to check for type 2 diabetes in people who do not have symptoms of the disease, but who may likely develop diabetes in the future. Diabetes can lead to serious health problems, but finding diabetes early allows for early treatment. DIABETES RISK FACTORS   Family history of diabetes.  Diseases of the pancreas.  Obesity or being overweight.  Certain racial or ethnic groups:  American [place].  [place].  Hispanic.  Asian.  African American.  High blood pressure (hypertension).  History of diabetes while pregnant (gestational diabetes).  Delivering a baby that weighed over 9 pounds.  Being inactive.  High cholesterol or triglycerides.  Age, especially over 60 years of age.  Other diseases or conditions.  Diseases of the pancreas.  Cardiovascular disease.  Disorders of the endocrine system.  Certain medicines, such as those that treat high blood cholesterol levels. WHO IS SCREENED Adults  Adults who have no risk factors and no symptoms should be screened starting at age 37. If the screening tests are normal, they should be repeated every 3 years.  Adults who do not have symptoms, but have 1 or more risk factors, should be screened.  Adults who have 2 or more risk factors may be screened every year.  Adults who have an A1c (3 month average of blood glucose) greater than 5.7% or who had an impaired glucose tolerance (IGT) or impaired fasting glucose (IFG) on a previous test should be screened.  Pregnant women who have risk factors should be screened at their first prenatal visit.  Women who have given birth and had gestational diabetes should be screened 6-12 weeks after the child is born. This screening should be repeated every 1-3 years after the first test. Children or Adolescents  Children  and adolescents should be screened for type 2 diabetes if they are overweight and have 2 of the following risk factors:  Having a family history of type 2 diabetes.  Being a member of a high risk race or ethnic group.  Having signs of insulin resistance or conditions associated with insulin resistance.  Having a mother who had gestational diabetes while pregnant with him or her.  Screening should start at age 3 or at the onset of puberty, whichever comes first. This should be repeated every 2 years. SCREENING In a screening, your caregiver may:  Ask questions  about your overall health. This will include questions about the health of close family members, too.  Ask about any diabetes-like symptoms you may have.  Perform a physical exam.  Order some tests that may include:  A fasting plasma glucose test. This measures the level of glucose in your blood. It is done after you have had nothing to eat but water (fasted) for 8 hours.  A random blood glucose test. This test is done without the need to fast.  An oral glucose tolerance test. This is a blood test done in 2 parts. First, a blood sample is taken after you have fasted. Then, another sample is taken after you drink a liquid that contains a lot of sugar.  An A1c test. This test shows how much glucose has been in your blood over the past 2 to 3 months.   This information is not intended to replace advice given to you by your health care provider. Make sure you discuss any questions you have with your health care provider.   Document Released: 08/13/2009 Document Revised: 11/07/2014 Document Reviewed: 05/25/2011 Elsevier Interactive Patient Education 2016 [place] in the Home  Falls can cause injuries. They can happen to people of all ages. There are many things you can do to make your home safe and to help prevent falls.  WHAT CAN I DO ON THE OUTSIDE OF MY HOME?  Regularly fix the edges of walkways and driveways and fix any cracks.  Remove anything that might make you trip as you walk through a door, such as a raised step or threshold.  Trim any bushes or trees on the path to your home.  Use bright outdoor lighting.  Clear any walking paths of anything that might make someone trip, such as rocks or tools.  Regularly check to see if handrails are loose or broken. Make sure that both sides of any steps have handrails.  Any raised decks and porches should have guardrails on the edges.  Have any leaves, snow, or ice cleared regularly.  Use sand or salt on  walking paths during winter.  Clean up any spills in your garage right away. This includes oil or grease spills. WHAT CAN I DO IN THE BATHROOM?   Use night lights.  Install grab bars by the toilet and in the tub and shower. Do not use towel bars as grab bars.  Use non-skid mats or decals in the tub or shower.  If you need to sit down in the shower, use a plastic, non-slip stool.  Keep the floor dry. Clean up any water that spills on the floor as soon as it happens.  Remove soap buildup in the tub or shower regularly.  Attach bath mats securely with double-sided non-slip rug tape.  Do not have throw rugs and other things on the floor that can make you trip. WHAT CAN I  DO IN THE BEDROOM?  Use night lights.  Make sure that you have a light by your bed that is easy to reach.  Do not use any sheets or blankets that are too big for your bed. They should not hang down onto the floor.  Have a firm chair that has side arms. You can use this for support while you get dressed.  Do not have throw rugs and other things on the floor that can make you trip. WHAT CAN I DO IN THE KITCHEN?  Clean up any spills right away.  Avoid walking on wet floors.  Keep items that you use a lot in easy-to-reach places.  If you need to reach something above you, use a strong step stool that has a grab bar.  Keep electrical cords out of the way.  Do not use floor polish or wax that makes floors slippery. If you must use wax, use non-skid floor wax.  Do not have throw rugs and other things on the floor that can make you trip. WHAT CAN I DO WITH MY STAIRS?  Do not leave any items on the stairs.  Make sure that there are handrails on both sides of the stairs and use them. Fix handrails that are broken or loose. Make sure that handrails are as long as the stairways.  Check any carpeting to make sure that it is firmly attached to the stairs. Fix any carpet that is loose or worn.  Avoid having throw  rugs at the top or bottom of the stairs. If you do have throw rugs, attach them to the floor with carpet tape.  Make sure that you have a light switch at the top of the stairs and the bottom of the stairs. If you do not have them, ask someone to add them for you. WHAT ELSE CAN I DO TO HELP PREVENT FALLS?  Wear shoes that:  Do not have high heels.  Have rubber bottoms.  Are comfortable and fit you well.  Are closed at the toe. Do not wear sandals.  If you use a stepladder:  Make sure that it is fully opened. Do not climb a closed stepladder.  Make sure that both sides of the stepladder are locked into place.  Ask someone to hold it for you, if possible.  Clearly mark and make sure that you can see:  Any grab bars or handrails.  First and last steps.  Where the edge of each step is.  Use tools that help you move around (mobility aids) if they are needed. These include:  Canes.  Walkers.  Scooters.  Crutches.  Turn on the lights when you go into a dark area. Replace any light bulbs as soon as they burn out.  Set up your furniture so you have a clear path. Avoid moving your furniture around.  If any of your floors are uneven, fix them.  If there are any pets around you, be aware of where they are.  Review your medicines with your doctor. Some medicines can make you feel dizzy. This can increase your chance of falling. Ask your doctor what other things that you can do to help prevent falls.   This information is not intended to replace advice given to you by your health care provider. Make sure you discuss any questions you have with your health care provider.   Document Released: 08/13/2009 Document Revised: 03/03/2015 Document Reviewed: 11/21/2014 Elsevier Interactive Patient Education 2016 [place] Maintenance, Male A  healthy lifestyle and preventative care can promote health and wellness.  Maintain regular health, dental, and eye exams.  Eat  a healthy diet. Foods like vegetables, fruits, whole grains, low-fat dairy products, and lean protein foods contain the nutrients you need and are low in calories. Decrease your intake of foods high in solid fats, added sugars, and salt. Get information about a proper diet from your health care provider, if necessary.  Regular physical exercise is one of the most important things you can do for your health. Most adults should get at least 150 minutes of moderate-intensity exercise (any activity that increases your heart rate and causes you to sweat) each week. In addition, most adults need muscle-strengthening exercises on 2 or more days a week.   Maintain a healthy weight. The body mass index (BMI) is a screening tool to identify possible weight problems. It provides an estimate of body fat based on height and weight. Your health care provider can find your BMI and can help you achieve or maintain a healthy weight. For males 20 years and older:  A BMI below 18.5 is considered underweight.  A BMI of 18.5 to 24.9 is normal.  A BMI of 25 to 29.9 is considered overweight.  A BMI of 30 and above is considered obese.  Maintain normal blood lipids and cholesterol by exercising and minimizing your intake of saturated fat. Eat a balanced diet with plenty of fruits and vegetables. Blood tests for lipids and cholesterol should begin at age 10 and be repeated every 5 years. If your lipid or cholesterol levels are high, you are over age 96, or you are at high risk for heart disease, you may need your cholesterol levels checked more frequently.Ongoing high lipid and cholesterol levels should be treated with medicines if diet and exercise are not working.  If you smoke, find out from your health care provider how to quit. If you do not use tobacco, do not start.  Lung cancer screening is recommended for adults aged 22-80 years who are at high risk for developing lung cancer because of a history of smoking. A  yearly low-dose CT scan of the lungs is recommended for people who have at least a 30-pack-year history of smoking and are current smokers or have quit within the past 15 years. A pack year of smoking is smoking an average of 1 pack of cigarettes a day for 1 year (for example, a 30-pack-year history of smoking could mean smoking 1 pack a day for 30 years or 2 packs a day for 15 years). Yearly screening should continue until the smoker has stopped smoking for at least 15 years. Yearly screening should be stopped for people who develop a health problem that would prevent them from having lung cancer treatment.  If you choose to drink alcohol, do not have more than 2 drinks per day. One drink is considered to be 12 oz (360 mL) of beer, 5 oz (150 mL) of wine, or 1.5 oz (45 mL) of liquor.  Avoid the use of street drugs. Do not share needles with anyone. Ask for help if you need support or instructions about stopping the use of drugs.  High blood pressure causes heart disease and increases the risk of stroke. High blood pressure is more likely to develop in:  People who have blood pressure in the end of the normal range (100-139/85-89 mm Hg).  People who are overweight or obese.  People who are African American.  If you are  26-31 years of age, have your blood pressure checked every 3-5 years. If you are 39 years of age or older, have your blood pressure checked every year. You should have your blood pressure measured twice--once when you are at a hospital or clinic, and once when you are not at a hospital or clinic. Record the average of the two measurements. To check your blood pressure when you are not at a hospital or clinic, you can use:  An automated blood pressure machine at a pharmacy.  A home blood pressure monitor.  If you are 27-77 years old, ask your health care provider if you should take aspirin to prevent heart disease.  Diabetes screening involves taking a blood sample to check your  fasting blood sugar level. This should be done once every 3 years after age 47 if you are at a normal weight and without risk factors for diabetes. Testing should be considered at a younger age or be carried out more frequently if you are overweight and have at least 1 risk factor for diabetes.  Colorectal cancer can be detected and often prevented. Most routine colorectal cancer screening begins at the age of 21 and continues through age 59. However, your health care provider may recommend screening at an earlier age if you have risk factors for colon cancer. On a yearly basis, your health care provider may provide home test kits to check for hidden blood in the stool. A small camera at the end of a tube may be used to directly examine the colon (sigmoidoscopy or colonoscopy) to detect the earliest forms of colorectal cancer. Talk to your health care provider about this at age 16 when routine screening begins. A direct exam of the colon should be repeated every 5-10 years through age 15, unless early forms of precancerous polyps or small growths are found.  People who are at an increased risk for hepatitis B should be screened for this virus. You are considered at high risk for hepatitis B if:  You were born in a country where hepatitis B occurs often. Talk with your health care provider about which countries are considered high risk.  Your parents were born in a high-risk country and you have not received a shot to protect against hepatitis B (hepatitis B vaccine).  You have HIV or AIDS.  You use needles to inject street drugs.  You live with, or have sex with, someone who has hepatitis B.  You are a man who has sex with other men (MSM).  You get hemodialysis treatment.  You take certain medicines for conditions like cancer, organ transplantation, and autoimmune conditions.  Hepatitis C blood testing is recommended for all people born from [birth year] through 1963 and any individual with known risk  factors for hepatitis C.  Healthy men should no longer receive prostate-specific antigen (PSA) blood tests as part of routine cancer screening. Talk to your health care provider about prostate cancer screening.  Testicular cancer screening is not recommended for adolescents or adult males who have no symptoms. Screening includes self-exam, a health care provider exam, and other screening tests. Consult with your health care provider about any symptoms you have or any concerns you have about testicular cancer.  Practice safe sex. Use condoms and avoid high-risk sexual practices to reduce the spread of sexually transmitted infections (STIs).  You should be screened for STIs, including gonorrhea and chlamydia if:  You are sexually active and are younger than 24 years.  You are older than  24 years, and your health care provider tells you that you are at risk for this type of infection.  Your sexual activity has changed since you were last screened, and you are at an increased risk for chlamydia or gonorrhea. Ask your health care provider if you are at risk.  If you are at risk of being infected with HIV, it is recommended that you take a prescription medicine daily to prevent HIV infection. This is called pre-exposure prophylaxis (PrEP). You are considered at risk if:  You are a man who has sex with other men (MSM).  You are a heterosexual man who is sexually active with multiple partners.  You take drugs by injection.  You are sexually active with a partner who has HIV.  Talk with your health care provider about whether you are at high risk of being infected with HIV. If you choose to begin PrEP, you should first be tested for HIV. You should then be tested every 3 months for as long as you are taking PrEP.  Use sunscreen. Apply sunscreen liberally and repeatedly throughout the day. You should seek shade when your shadow is shorter than you. Protect yourself by wearing long sleeves, pants, a  wide-brimmed hat, and sunglasses year round whenever you are outdoors.  Tell your health care provider of new moles or changes in moles, especially if there is a change in shape or color. Also, tell your health care provider if a mole is larger than the size of a pencil eraser.  A one-time screening for abdominal aortic aneurysm (AAA) and surgical repair of large AAAs by ultrasound is recommended for men aged 33-75 years who are current or former smokers.  Stay current with your vaccines (immunizations).   This information is not intended to replace advice given to you by your health care provider. Make sure you discuss any questions you have with your health care provider.   Document Released: 04/14/2008 Document Revised: 11/07/2014 Document Reviewed: 03/14/2011 Elsevier Interactive Patient Education [employer].

## 2016-07-08 ENCOUNTER — Other Ambulatory Visit (INDEPENDENT_AMBULATORY_CARE_PROVIDER_SITE_OTHER): Payer: Medicare Other

## 2016-07-08 DIAGNOSIS — H52223 Regular astigmatism, bilateral: Secondary | ICD-10-CM | POA: Diagnosis not present

## 2016-07-08 DIAGNOSIS — H43813 Vitreous degeneration, bilateral: Secondary | ICD-10-CM | POA: Diagnosis not present

## 2016-07-08 DIAGNOSIS — Z0000 Encounter for general adult medical examination without abnormal findings: Secondary | ICD-10-CM | POA: Diagnosis not present

## 2016-07-08 DIAGNOSIS — H5010 Unspecified exotropia: Secondary | ICD-10-CM | POA: Diagnosis not present

## 2016-07-08 DIAGNOSIS — H2513 Age-related nuclear cataract, bilateral: Secondary | ICD-10-CM | POA: Diagnosis not present

## 2016-07-08 LAB — BASIC METABOLIC PANEL
BUN: 20 mg/dL (ref 6–23)
CALCIUM: 8.4 mg/dL (ref 8.4–10.5)
CHLORIDE: 99 meq/L (ref 96–112)
CO2: 26 meq/L (ref 19–32)
CREATININE: 0.83 mg/dL (ref 0.40–1.50)
GFR: 95.25 mL/min (ref >60.00)
Glucose, Bld: 98 mg/dL (ref 70–99)
Potassium: 4.4 mEq/L (ref 3.5–5.1)
SODIUM: 133 meq/L — AB (ref 135–145)

## 2016-07-08 LAB — TSH: TSH: 2.96 u[IU]/mL (ref 0.35–4.50)

## 2016-07-08 LAB — HEPATIC FUNCTION PANEL
ALK PHOS: 46 U/L (ref 39–117)
ALT: 13 U/L (ref 0–53)
AST: 13 U/L (ref 0–37)
Albumin: 3.8 g/dL (ref 3.5–5.2)
BILIRUBIN TOTAL: 0.5 mg/dL (ref 0.2–1.2)
Bilirubin, Direct: 0.1 mg/dL (ref 0.0–0.3)
Total Protein: 6.3 g/dL (ref 6.0–8.3)

## 2016-07-08 LAB — CBC WITH DIFFERENTIAL/PLATELET
Basophils Absolute: 0 10*3/uL (ref 0.0–0.1)
Basophils Relative: 0.5 % (ref 0.0–3.0)
EOS ABS: 0.4 10*3/uL (ref 0.0–0.7)
Eosinophils Relative: 6 % — ABNORMAL HIGH (ref 0.0–5.0)
HCT: 37.4 % — ABNORMAL LOW (ref 39.0–52.0)
HEMOGLOBIN: 12.3 g/dL — AB (ref 13.0–17.0)
Lymphocytes Relative: 27 % (ref 12.0–46.0)
Lymphs Abs: 1.9 10*3/uL (ref 0.7–4.0)
MCHC: 32.8 g/dL (ref 30.0–36.0)
MCV: 78.2 fl (ref 78.0–100.0)
MONO ABS: 0.8 10*3/uL (ref 0.1–1.0)
Monocytes Relative: 12.1 % — ABNORMAL HIGH (ref 3.0–12.0)
NEUTROS PCT: 54.4 % (ref 43.0–77.0)
Neutro Abs: 3.7 10*3/uL (ref 1.4–7.7)
Platelets: 246 10*3/uL (ref 150.0–400.0)
RBC: 4.78 Mil/uL (ref 4.22–5.81)
RDW: 15.2 % (ref 11.5–15.5)
WBC: 6.9 10*3/uL (ref 4.0–10.5)

## 2016-07-08 LAB — PSA: PSA: 2.79 ng/mL (ref 0.10–4.00)

## 2016-07-08 LAB — LIPID PANEL
CHOL/HDL RATIO: 3
Cholesterol: 182 mg/dL (ref 0–200)
HDL: 66.7 mg/dL (ref >39.00)
LDL CALC: 104 mg/dL — AB (ref 0–99)
NONHDL: 114.85
Triglycerides: 53 mg/dL (ref 0.0–149.0)
VLDL: 10.6 mg/dL (ref 0.0–40.0)

## 2016-07-11 ENCOUNTER — Other Ambulatory Visit: Payer: Self-pay | Admitting: Family Medicine

## 2016-07-11 ENCOUNTER — Encounter: Payer: Medicare Other | Admitting: Family Medicine

## 2016-07-12 NOTE — Telephone Encounter (Signed)
Rx refill sent to pharmacy. 

## 2016-07-15 ENCOUNTER — Ambulatory Visit (INDEPENDENT_AMBULATORY_CARE_PROVIDER_SITE_OTHER): Payer: Medicare Other | Admitting: Family Medicine

## 2016-07-15 ENCOUNTER — Encounter: Payer: Self-pay | Admitting: Family Medicine

## 2016-07-15 VITALS — BP 110/60 | HR 71 | Temp 97.8°F | Ht 67.0 in | Wt 140.2 lb

## 2016-07-15 DIAGNOSIS — Z0000 Encounter for general adult medical examination without abnormal findings: Secondary | ICD-10-CM | POA: Diagnosis not present

## 2016-07-15 DIAGNOSIS — Z23 Encounter for immunization: Secondary | ICD-10-CM

## 2016-07-15 NOTE — Progress Notes (Signed)
Pre visit review using our clinic review tool, if applicable. No additional management support is needed unless otherwise documented below in the visit note. 

## 2016-07-15 NOTE — Progress Notes (Signed)
Subjective:     Patient ID: [name], male   DOB: Apr 14, 1936, 80 y.o.   MRN: [id number]  HPI Patient seen for physical exam. He had recent Medicare wellness visit. His chronic problems include history of BPH, prediabetes, dyslipidemia. Needs flu vaccine. Other immunizations up-to-date. BPH symptoms stable on Hytrin.  Nonsmoker. Exercises at the gym 3-4 days per week and also plays golf usually couple times per week. No recent chest pains. No dizziness. No falls. Father had CAD age 8. Patient has GERD controlled with omeprazole. No recent dysphagia  Past Medical History:  Diagnosis Date  . Anxiety   . BPH (benign prostatic hypertrophy)   . GERD (gastroesophageal reflux disease)   . Sleep apnea    Past Surgical History:  Procedure Laterality Date  . NASAL SEPTOPLASTY W/ TURBINOPLASTY  08/03/2012   Procedure: NASAL SEPTOPLASTY WITH TURBINATE REDUCTION;  Surgeon: Izora Gala, MD;  Location: [facility];  Service: ENT;  Laterality: Bilateral;    reports that he has quit smoking. He has never used smokeless tobacco. He reports that he drinks about 0.6 oz of alcohol per week . He reports that he does not use drugs. family history includes Heart attack in his father; Hypertension in his daughter and father. No Known Allergies   Review of Systems  Constitutional: Negative for activity change, appetite change, fatigue and fever.  HENT: Negative for congestion, ear pain and trouble swallowing.   Eyes: Negative for pain and visual disturbance.  Respiratory: Negative for cough, shortness of breath and wheezing.   Cardiovascular: Negative for chest pain and palpitations.  Gastrointestinal: Negative for abdominal distention, abdominal pain, blood in stool, constipation, diarrhea, nausea, rectal pain and vomiting.  Genitourinary: Negative for dysuria, hematuria and testicular pain.  Musculoskeletal: Positive for arthralgias. Negative for joint swelling.  Skin: Negative for rash.  Neurological:  Negative for dizziness, syncope and headaches.  Hematological: Negative for adenopathy.  Psychiatric/Behavioral: Negative for confusion and dysphoric mood.       Objective:   Physical Exam  Constitutional: He is oriented to person, place, and time. He appears well-developed and well-nourished. No distress.  HENT:  Head: Normocephalic and atraumatic.  Right Ear: External ear normal.  Left Ear: External ear normal.  Mouth/Throat: Oropharynx is clear and moist.  Eyes: Conjunctivae and EOM are normal. Pupils are equal, round, and reactive to light.  Neck: Normal range of motion. Neck supple. No thyromegaly present.  Cardiovascular: Normal rate, regular rhythm and normal heart sounds.   No murmur heard. Pulmonary/Chest: No respiratory distress. He has no wheezes. He has no rales.  Abdominal: Soft. Bowel sounds are normal. He exhibits no distension and no mass. There is no tenderness. There is no rebound and no guarding.  Musculoskeletal: He exhibits no edema.  Lymphadenopathy:    He has no cervical adenopathy.  Neurological: He is alert and oriented to person, place, and time. He displays normal reflexes. No cranial nerve deficit.  Skin: No rash noted.  Psychiatric: He has a normal mood and affect.       Assessment:     Physical exam. Labs reviewed. No major concerns. PSA is up slightly to 2.79    Plan:     -flu vaccine given. -Continue regular exercise habits -Continue current medications  [name] MD [facility]

## 2016-07-25 ENCOUNTER — Other Ambulatory Visit: Payer: Self-pay | Admitting: Family Medicine

## 2016-07-25 MED ORDER — CITALOPRAM HYDROBROMIDE 20 MG PO TABS: 20.0000 mg | ORAL_TABLET | Freq: Every day | ORAL | 0 refills | Status: DC

## 2016-07-25 MED ORDER — OMEPRAZOLE 40 MG PO CPDR: 40.0000 mg | DELAYED_RELEASE_CAPSULE | Freq: Every day | ORAL | 0 refills | Status: DC

## 2016-08-17 ENCOUNTER — Other Ambulatory Visit: Payer: Self-pay

## 2016-08-17 MED ORDER — OMEPRAZOLE 40 MG PO CPDR: 40.0000 mg | DELAYED_RELEASE_CAPSULE | Freq: Every day | ORAL | 0 refills | Status: DC

## 2016-08-29 DIAGNOSIS — L821 Other seborrheic keratosis: Secondary | ICD-10-CM | POA: Diagnosis not present

## 2016-08-29 DIAGNOSIS — L814 Other melanin hyperpigmentation: Secondary | ICD-10-CM | POA: Diagnosis not present

## 2016-08-29 DIAGNOSIS — D1801 Hemangioma of skin and subcutaneous tissue: Secondary | ICD-10-CM | POA: Diagnosis not present

## 2016-08-29 DIAGNOSIS — L570 Actinic keratosis: Secondary | ICD-10-CM | POA: Diagnosis not present

## 2016-08-29 DIAGNOSIS — L720 Epidermal cyst: Secondary | ICD-10-CM | POA: Diagnosis not present

## 2016-09-06 ENCOUNTER — Other Ambulatory Visit: Payer: Self-pay

## 2016-09-07 ENCOUNTER — Other Ambulatory Visit: Payer: Self-pay

## 2016-09-07 MED ORDER — TERAZOSIN HCL 5 MG PO CAPS: 5.0000 mg | ORAL_CAPSULE | Freq: Every day | ORAL | 1 refills | Status: DC

## 2016-09-08 ENCOUNTER — Other Ambulatory Visit: Payer: Self-pay

## 2016-09-08 MED ORDER — TERAZOSIN HCL 5 MG PO CAPS
5.0000 mg | ORAL_CAPSULE | Freq: Every day | ORAL | 2 refills | Status: DC
Start: 2016-09-08 — End: 2017-05-25

## 2016-10-06 ENCOUNTER — Other Ambulatory Visit: Payer: Self-pay | Admitting: Family Medicine

## 2016-12-13 ENCOUNTER — Other Ambulatory Visit: Payer: Self-pay | Admitting: Family Medicine

## 2016-12-15 ENCOUNTER — Other Ambulatory Visit: Payer: Self-pay | Admitting: Family Medicine

## 2016-12-26 ENCOUNTER — Other Ambulatory Visit: Payer: Self-pay | Admitting: Family Medicine

## 2017-02-06 ENCOUNTER — Ambulatory Visit (INDEPENDENT_AMBULATORY_CARE_PROVIDER_SITE_OTHER): Payer: Medicare Other | Admitting: Family Medicine

## 2017-02-06 ENCOUNTER — Encounter: Payer: Self-pay | Admitting: Family Medicine

## 2017-02-06 DIAGNOSIS — F411 Generalized anxiety disorder: Secondary | ICD-10-CM | POA: Diagnosis not present

## 2017-02-06 MED ORDER — CITALOPRAM HYDROBROMIDE 40 MG PO TABS: 40.0000 mg | ORAL_TABLET | Freq: Every day | ORAL | 5 refills | Status: DC

## 2017-02-06 NOTE — Progress Notes (Signed)
Pre visit review using our clinic review tool, if applicable. No additional management support is needed unless otherwise documented below in the visit note. 

## 2017-02-06 NOTE — Patient Instructions (Signed)

## 2017-02-06 NOTE — Progress Notes (Signed)
Subjective:     Patient ID: [name], male   DOB: July 17, 1936, 81 y.o.   MRN: [id number]  HPI Patient seen with about 9 day history of increased anxiety symptoms. He's had discrete episodes of increased anxiety past. He was started on Celexa 20 mg daily years ago for similar episode. Other than some mild stress with one of his sons has had some job and home concerns-no other major stressors. Patient states that he wakes up with anxiety and this is pervasive throughout the day. He has no history of panic disorder. No issues with social anxiety. He has all but eliminated caffeine. No regular alcohol use. Denies depression symptoms. He had normal TSH back in September. Generally sleeping okay.  Wife has suggested he go for more walks and exercise and try to stay engaged in activities but he has declined these things.  Past Medical History:  Diagnosis Date  . Anxiety   . BPH (benign prostatic hypertrophy)   . GERD (gastroesophageal reflux disease)   . Sleep apnea    Past Surgical History:  Procedure Laterality Date  . NASAL SEPTOPLASTY W/ TURBINOPLASTY  08/03/2012   Procedure: NASAL SEPTOPLASTY WITH TURBINATE REDUCTION;  Surgeon: Izora Gala, MD;  Location: [place];  Service: ENT;  Laterality: Bilateral;    reports that he has quit smoking. He has never used smokeless tobacco. He reports that he drinks about 0.6 oz of alcohol per week . He reports that he does not use drugs. family history includes Heart attack in his father; Hypertension in his daughter and father. No Known Allergies   Review of Systems  Constitutional: Negative for appetite change and unexpected weight change.  Respiratory: Negative for shortness of breath.   Cardiovascular: Negative for chest pain.  Psychiatric/Behavioral: Negative for agitation, confusion, dysphoric mood, sleep disturbance and suicidal ideas. The patient is nervous/anxious.        Objective:   Physical Exam  Constitutional: He is oriented to  person, place, and time. He appears well-developed and well-nourished.  Cardiovascular: Normal rate and regular rhythm.   Pulmonary/Chest: Effort normal and breath sounds normal. No respiratory distress. He has no wheezes. He has no rales.  Neurological: He is alert and oriented to person, place, and time. No cranial nerve deficit.  Psychiatric: He has a normal mood and affect. His behavior is normal. Judgment and thought content normal.  PHQ-9 score of 1.       Assessment:     Anxiety state. He does not seem doesn't meet clear description for social anxiety, OCD, panic disorder, or generalized anxiety.    Plan:     -Increase citalopram to 40 mg once daily -Minimize caffeine use -Discussed nonpharmacologic ways to try to help deal with anxiety -Recommend consider cognitive behavioral therapy-with counseling -Reassess in 2 weeks and sooner as needed  [name] MD [facility] at [facility]

## 2017-02-07 ENCOUNTER — Ambulatory Visit: Payer: Medicare Other | Admitting: Psychology

## 2017-02-10 ENCOUNTER — Ambulatory Visit (INDEPENDENT_AMBULATORY_CARE_PROVIDER_SITE_OTHER): Payer: Medicare Other | Admitting: Family Medicine

## 2017-02-10 ENCOUNTER — Encounter: Payer: Self-pay | Admitting: Family Medicine

## 2017-02-10 VITALS — BP 140/78 | HR 72 | Temp 98.2°F | Wt 142.7 lb

## 2017-02-10 DIAGNOSIS — F411 Generalized anxiety disorder: Secondary | ICD-10-CM | POA: Diagnosis not present

## 2017-02-10 MED ORDER — CLONAZEPAM 0.5 MG PO TABS: 0.5000 mg | ORAL_TABLET | Freq: Two times a day (BID) | ORAL | 0 refills | Status: DC | PRN

## 2017-02-10 NOTE — Progress Notes (Signed)
Subjective:     Patient ID: [name], male   DOB: 12-19-1935, 81 y.o.   MRN: [id number]  HPI Patient here for follow-up regarding anxiety symptoms. Refer to recent note-from 02/06/17:  "Patient seen with about 9 day history of increased anxiety symptoms. He's had discrete episodes of increased anxiety past. He was started on Celexa 20 mg daily years ago for similar episode. Other than some mild stress with one of his sons has had some job and home concerns-no other major stressors. Patient states that he wakes up with anxiety and this is pervasive throughout the day. He has no history of panic disorder. No issues with social anxiety. He has all but eliminated caffeine. No regular alcohol use. Denies depression symptoms. He had normal TSH back in September. Generally sleeping okay."  We increased his Celexa to 40 mg daily. He has not seen any change thus far. His anxiety seems to worse in the morning. He has all but eliminated caffeine. No recent alcohol use. He is scheduled follow-up with psychologist for cognitive behavioral intervention and this will be May 10th. His son (who is a Teacher, music) had suggested consider when necessary use of low-dose Klonopin short-term.  Recent pH Q-9 score of 1  Past Medical History:  Diagnosis Date  . Anxiety   . BPH (benign prostatic hypertrophy)   . GERD (gastroesophageal reflux disease)   . Sleep apnea    Past Surgical History:  Procedure Laterality Date  . NASAL SEPTOPLASTY W/ TURBINOPLASTY  08/03/2012   Procedure: NASAL SEPTOPLASTY WITH TURBINATE REDUCTION;  Surgeon: Izora Gala, MD;  Location: [place];  Service: ENT;  Laterality: Bilateral;    reports that he has quit smoking. He has never used smokeless tobacco. He reports that he drinks about 0.6 oz of alcohol per week . He reports that he does not use drugs. family history includes Heart attack in his father; Hypertension in his daughter and father. No Known Allergies   Review of Systems   Constitutional: Negative for appetite change and unexpected weight change.  Respiratory: Negative for shortness of breath.   Cardiovascular: Negative for chest pain.  Psychiatric/Behavioral: Negative for agitation, confusion and suicidal ideas. The patient is nervous/anxious.        Objective:   Physical Exam  Constitutional: He appears well-developed and well-nourished.  Cardiovascular: Normal rate and regular rhythm.   Pulmonary/Chest: Effort normal and breath sounds normal. No respiratory distress. He has no wheezes. He has no rales.       Assessment:     Anxiety state.  Hx of similar anxiety in past.  No clear situational stressors.    Plan:     -Recent increase in dosage of Celexa to 40 mg daily and this we reiterated has not had time to have much effect yet -We agreed to short-term only use of Klonopin 0.5 mg once daily as needed #30 with no refill -Patient has appointment with psychologist set up as above -We again reiterated importance of other nonpharmacologic measures to help reduce anxiety such as staying engaged in activities and exercise  [name] MD [facility] at [facility]

## 2017-02-10 NOTE — Progress Notes (Signed)
Pre visit review using our clinic review tool, if applicable. No additional management support is needed unless otherwise documented below in the visit note. 

## 2017-03-03 ENCOUNTER — Ambulatory Visit (INDEPENDENT_AMBULATORY_CARE_PROVIDER_SITE_OTHER): Payer: Medicare Other | Admitting: Family Medicine

## 2017-03-03 ENCOUNTER — Encounter: Payer: Self-pay | Admitting: Family Medicine

## 2017-03-03 VITALS — BP 124/70 | HR 68 | Temp 98.0°F | Wt 142.6 lb

## 2017-03-03 DIAGNOSIS — F411 Generalized anxiety disorder: Secondary | ICD-10-CM | POA: Diagnosis not present

## 2017-03-03 MED ORDER — CLONAZEPAM 0.5 MG PO TABS
0.5000 mg | ORAL_TABLET | Freq: Two times a day (BID) | ORAL | 0 refills | Status: DC | PRN
Start: 2017-03-03 — End: 2017-10-17

## 2017-03-03 NOTE — Progress Notes (Signed)
Subjective:     Patient ID: [name], male   DOB: 19-Jun-1936, 81 y.o.   MRN: [id number]  HPI Patient seen for follow-up regarding recent recurrent anxiety. He had similar episode back in 2005 which eventually resolved. He states his symptoms are much improved compared to last visit. We titrated his Celexa to 40 mg daily. We prescribed limited Klonopin 0.5 mg currently taking one half tablet twice daily and plans to taper off soon. He does have international travel in 2-1/2 weeks and is requesting to stay on medication until he finishes that trip.  He has set up to see psychologist will see the next week for suggestions for anxiety management. His only specific stressor is one of his sons is had some financial issues recently. Denies depression  Past Medical History:  Diagnosis Date  . Anxiety   . BPH (benign prostatic hypertrophy)   . GERD (gastroesophageal reflux disease)   . Sleep apnea    Past Surgical History:  Procedure Laterality Date  . NASAL SEPTOPLASTY W/ TURBINOPLASTY  08/03/2012   Procedure: NASAL SEPTOPLASTY WITH TURBINATE REDUCTION;  Surgeon: Izora Gala, MD;  Location: [place];  Service: ENT;  Laterality: Bilateral;    reports that he has quit smoking. He has never used smokeless tobacco. He reports that he drinks about 0.6 oz of alcohol per week . He reports that he does not use drugs. family history includes Heart attack in his father; Hypertension in his daughter and father. No Known Allergies   Review of Systems  Constitutional: Negative for appetite change and unexpected weight change.  Cardiovascular: Negative for chest pain.  Neurological: Negative for headaches.  Psychiatric/Behavioral: Negative for agitation, confusion and dysphoric mood. The patient is nervous/anxious.        Objective:   Physical Exam  Constitutional: He appears well-developed and well-nourished. No distress.  Cardiovascular: Normal rate and regular rhythm.   Pulmonary/Chest: Effort  normal and breath sounds normal. No respiratory distress. He has no wheezes. He has no rales.  Psychiatric: He has a normal mood and affect. His behavior is normal. Judgment and thought content normal.       Assessment:     Anxiety symptoms greatly improved on Celexa    Plan:     -He'll continue to taper off Klonopin. We did give him 1 refill to last through his upcoming trip -He is encouraged to follow through with psychological counseling -Establish more regular exercise  [name] MD [facility] at [facility]

## 2017-03-03 NOTE — Progress Notes (Signed)
Pre visit review using our clinic review tool, if applicable. No additional management support is needed unless otherwise documented below in the visit note. 

## 2017-03-09 ENCOUNTER — Ambulatory Visit (INDEPENDENT_AMBULATORY_CARE_PROVIDER_SITE_OTHER): Payer: 59 | Admitting: Psychology

## 2017-03-09 DIAGNOSIS — F419 Anxiety disorder, unspecified: Secondary | ICD-10-CM

## 2017-04-10 ENCOUNTER — Ambulatory Visit: Payer: Self-pay | Admitting: Psychology

## 2017-04-18 DIAGNOSIS — M47816 Spondylosis without myelopathy or radiculopathy, lumbar region: Secondary | ICD-10-CM | POA: Diagnosis not present

## 2017-04-18 DIAGNOSIS — M546 Pain in thoracic spine: Secondary | ICD-10-CM | POA: Diagnosis not present

## 2017-04-18 DIAGNOSIS — M5136 Other intervertebral disc degeneration, lumbar region: Secondary | ICD-10-CM | POA: Diagnosis not present

## 2017-04-18 DIAGNOSIS — M549 Dorsalgia, unspecified: Secondary | ICD-10-CM | POA: Diagnosis not present

## 2017-04-18 DIAGNOSIS — M4155 Other secondary scoliosis, thoracolumbar region: Secondary | ICD-10-CM | POA: Diagnosis not present

## 2017-04-26 DIAGNOSIS — M48061 Spinal stenosis, lumbar region without neurogenic claudication: Secondary | ICD-10-CM | POA: Diagnosis not present

## 2017-04-26 DIAGNOSIS — M5416 Radiculopathy, lumbar region: Secondary | ICD-10-CM | POA: Diagnosis not present

## 2017-05-10 ENCOUNTER — Ambulatory Visit (INDEPENDENT_AMBULATORY_CARE_PROVIDER_SITE_OTHER): Payer: 59 | Admitting: Psychology

## 2017-05-10 DIAGNOSIS — F419 Anxiety disorder, unspecified: Secondary | ICD-10-CM

## 2017-05-17 ENCOUNTER — Ambulatory Visit (INDEPENDENT_AMBULATORY_CARE_PROVIDER_SITE_OTHER): Payer: Medicare Other | Admitting: Family Medicine

## 2017-05-17 VITALS — BP 148/78 | HR 70 | Temp 98.2°F | Ht 67.0 in | Wt 143.4 lb

## 2017-05-17 DIAGNOSIS — F321 Major depressive disorder, single episode, moderate: Secondary | ICD-10-CM | POA: Diagnosis not present

## 2017-05-17 DIAGNOSIS — F411 Generalized anxiety disorder: Secondary | ICD-10-CM | POA: Diagnosis not present

## 2017-05-17 MED ORDER — DULOXETINE HCL 30 MG PO CPEP: 30.0000 mg | ORAL_CAPSULE | Freq: Every day | ORAL | 0 refills | Status: DC

## 2017-05-17 MED ORDER — DULOXETINE HCL 60 MG PO CPEP: 60.0000 mg | ORAL_CAPSULE | Freq: Every day | ORAL | 5 refills | Status: DC

## 2017-05-17 NOTE — Progress Notes (Signed)
Subjective:     Patient ID: [name], male   DOB: 1935/11/24, 81 y.o.   MRN: [id number]  HPI Patient has history of anxiety. We placed him last spring on Celexa at 40 mg daily and also some low-dose Klonopin which he has tapered off. His anxiety overall is improved but he feels that his depression symptoms have worsened. No suicidal ideation. He has depressive symptoms of decreased appetite, low motivation, difficulty concentrating, and feelings of hopelessness and helplessness.  He also has had some recent right lower extremity radiculitis symptoms and is seeing neurosurgeon. His son (who is a Teacher, music) had suggested possible switch to Cymbalta. He has not taken this previously.  Past Medical History:  Diagnosis Date  . Anxiety   . BPH (benign prostatic hypertrophy)   . GERD (gastroesophageal reflux disease)   . Sleep apnea    Past Surgical History:  Procedure Laterality Date  . NASAL SEPTOPLASTY W/ TURBINOPLASTY  08/03/2012   Procedure: NASAL SEPTOPLASTY WITH TURBINATE REDUCTION;  Surgeon: Izora Gala, MD;  Location: [place];  Service: ENT;  Laterality: Bilateral;    reports that he has quit smoking. He has never used smokeless tobacco. He reports that he drinks about 0.6 oz of alcohol per week . He reports that he does not use drugs. family history includes Heart attack in his father; Hypertension in his daughter and father. No Known Allergies   Review of Systems  Constitutional: Positive for appetite change.  Respiratory: Negative for cough.   Cardiovascular: Negative for chest pain.  Gastrointestinal: Negative for abdominal pain.  Musculoskeletal: Positive for back pain.  Psychiatric/Behavioral: Positive for dysphoric mood. Negative for agitation, confusion and suicidal ideas. The patient is nervous/anxious.        Objective:   Physical Exam  Constitutional: He appears well-developed and well-nourished.  Cardiovascular: Normal rate and regular rhythm.    Pulmonary/Chest: Effort normal and breath sounds normal. No respiratory distress. He has no wheezes. He has no rales.  Psychiatric:  Patient is alert and cooperative and makes good eye contact. PH Q-9 score of 17       Assessment:     Major depressive episode of at least moderate severity    Plan:     -We discussed discontinuing Celexa and starting Cymbalta 30 mg daily for 2 weeks then titrate to 60 mg daily. Hopefully, this can help his chronic pain as well as depression -Reassess in 4 weeks and sooner as needed  [name] MD [facility] at [facility]

## 2017-05-24 ENCOUNTER — Ambulatory Visit (INDEPENDENT_AMBULATORY_CARE_PROVIDER_SITE_OTHER): Payer: 59 | Admitting: Psychology

## 2017-05-24 DIAGNOSIS — F419 Anxiety disorder, unspecified: Secondary | ICD-10-CM | POA: Diagnosis not present

## 2017-05-25 ENCOUNTER — Other Ambulatory Visit: Payer: Self-pay | Admitting: Family Medicine

## 2017-05-26 DIAGNOSIS — M5136 Other intervertebral disc degeneration, lumbar region: Secondary | ICD-10-CM | POA: Diagnosis not present

## 2017-05-26 DIAGNOSIS — M5126 Other intervertebral disc displacement, lumbar region: Secondary | ICD-10-CM | POA: Diagnosis not present

## 2017-05-26 DIAGNOSIS — M47816 Spondylosis without myelopathy or radiculopathy, lumbar region: Secondary | ICD-10-CM | POA: Diagnosis not present

## 2017-05-26 DIAGNOSIS — M5416 Radiculopathy, lumbar region: Secondary | ICD-10-CM | POA: Diagnosis not present

## 2017-05-26 DIAGNOSIS — M4155 Other secondary scoliosis, thoracolumbar region: Secondary | ICD-10-CM | POA: Diagnosis not present

## 2017-06-01 DIAGNOSIS — R339 Retention of urine, unspecified: Secondary | ICD-10-CM | POA: Diagnosis not present

## 2017-06-01 DIAGNOSIS — N401 Enlarged prostate with lower urinary tract symptoms: Secondary | ICD-10-CM | POA: Diagnosis not present

## 2017-06-26 ENCOUNTER — Ambulatory Visit (INDEPENDENT_AMBULATORY_CARE_PROVIDER_SITE_OTHER): Payer: 59 | Admitting: Psychology

## 2017-06-26 ENCOUNTER — Ambulatory Visit: Payer: 59 | Admitting: Psychology

## 2017-06-26 DIAGNOSIS — F419 Anxiety disorder, unspecified: Secondary | ICD-10-CM

## 2017-07-11 DIAGNOSIS — M4155 Other secondary scoliosis, thoracolumbar region: Secondary | ICD-10-CM | POA: Diagnosis not present

## 2017-07-11 DIAGNOSIS — M5136 Other intervertebral disc degeneration, lumbar region: Secondary | ICD-10-CM | POA: Diagnosis not present

## 2017-07-11 DIAGNOSIS — M5126 Other intervertebral disc displacement, lumbar region: Secondary | ICD-10-CM | POA: Diagnosis not present

## 2017-07-11 DIAGNOSIS — M47816 Spondylosis without myelopathy or radiculopathy, lumbar region: Secondary | ICD-10-CM | POA: Diagnosis not present

## 2017-07-11 DIAGNOSIS — M5416 Radiculopathy, lumbar region: Secondary | ICD-10-CM | POA: Diagnosis not present

## 2017-07-13 DIAGNOSIS — M5136 Other intervertebral disc degeneration, lumbar region: Secondary | ICD-10-CM | POA: Diagnosis not present

## 2017-07-13 DIAGNOSIS — M4726 Other spondylosis with radiculopathy, lumbar region: Secondary | ICD-10-CM | POA: Diagnosis not present

## 2017-07-13 DIAGNOSIS — M48061 Spinal stenosis, lumbar region without neurogenic claudication: Secondary | ICD-10-CM | POA: Diagnosis not present

## 2017-07-18 DIAGNOSIS — H52223 Regular astigmatism, bilateral: Secondary | ICD-10-CM | POA: Diagnosis not present

## 2017-07-18 DIAGNOSIS — H43813 Vitreous degeneration, bilateral: Secondary | ICD-10-CM | POA: Diagnosis not present

## 2017-07-18 DIAGNOSIS — H2513 Age-related nuclear cataract, bilateral: Secondary | ICD-10-CM | POA: Diagnosis not present

## 2017-07-18 DIAGNOSIS — H5010 Unspecified exotropia: Secondary | ICD-10-CM | POA: Diagnosis not present

## 2017-07-20 ENCOUNTER — Encounter: Payer: Self-pay | Admitting: Family Medicine

## 2017-07-31 ENCOUNTER — Ambulatory Visit (INDEPENDENT_AMBULATORY_CARE_PROVIDER_SITE_OTHER): Payer: Medicare Other | Admitting: Psychology

## 2017-07-31 DIAGNOSIS — F419 Anxiety disorder, unspecified: Secondary | ICD-10-CM | POA: Diagnosis not present

## 2017-08-01 ENCOUNTER — Other Ambulatory Visit: Payer: Self-pay | Admitting: Family Medicine

## 2017-08-15 ENCOUNTER — Ambulatory Visit (INDEPENDENT_AMBULATORY_CARE_PROVIDER_SITE_OTHER): Payer: Medicare Other | Admitting: Family Medicine

## 2017-08-15 ENCOUNTER — Encounter: Payer: Self-pay | Admitting: Family Medicine

## 2017-08-15 VITALS — BP 110/70 | HR 89 | Temp 98.3°F | Wt 138.2 lb

## 2017-08-15 DIAGNOSIS — Z23 Encounter for immunization: Secondary | ICD-10-CM

## 2017-08-15 DIAGNOSIS — R197 Diarrhea, unspecified: Secondary | ICD-10-CM | POA: Diagnosis not present

## 2017-08-15 DIAGNOSIS — F339 Major depressive disorder, recurrent, unspecified: Secondary | ICD-10-CM | POA: Diagnosis not present

## 2017-08-15 LAB — CBC WITH DIFFERENTIAL/PLATELET
BASOS ABS: 0 10*3/uL (ref 0.0–0.1)
BASOS PCT: 0.4 % (ref 0.0–3.0)
EOS PCT: 1 % (ref 0.0–5.0)
Eosinophils Absolute: 0.1 10*3/uL (ref 0.0–0.7)
HEMATOCRIT: 37.9 % — AB (ref 39.0–52.0)
Hemoglobin: 12 g/dL — ABNORMAL LOW (ref 13.0–17.0)
LYMPHS ABS: 1.4 10*3/uL (ref 0.7–4.0)
LYMPHS PCT: 22.7 % (ref 12.0–46.0)
MCHC: 31.7 g/dL (ref 30.0–36.0)
MCV: 81.9 fl (ref 78.0–100.0)
MONOS PCT: 11.6 % (ref 3.0–12.0)
Monocytes Absolute: 0.7 10*3/uL (ref 0.1–1.0)
NEUTROS ABS: 4 10*3/uL (ref 1.4–7.7)
NEUTROS PCT: 64.3 % (ref 43.0–77.0)
PLATELETS: 258 10*3/uL (ref 150.0–400.0)
RBC: 4.62 Mil/uL (ref 4.22–5.81)
RDW: 15.2 % (ref 11.5–15.5)
WBC: 6.3 10*3/uL (ref 4.0–10.5)

## 2017-08-15 LAB — TSH: TSH: 1.72 u[IU]/mL (ref 0.35–4.50)

## 2017-08-15 LAB — BASIC METABOLIC PANEL
BUN: 22 mg/dL (ref 6–23)
CHLORIDE: 100 meq/L (ref 96–112)
CO2: 26 meq/L (ref 19–32)
Calcium: 8.8 mg/dL (ref 8.4–10.5)
Creatinine, Ser: 0.82 mg/dL (ref 0.40–1.50)
GFR: 96.32 mL/min (ref >60.00)
Glucose, Bld: 123 mg/dL — ABNORMAL HIGH (ref 70–99)
Potassium: 4.1 mEq/L (ref 3.5–5.1)
Sodium: 133 mEq/L — ABNORMAL LOW (ref 135–145)

## 2017-08-15 NOTE — Patient Instructions (Signed)
Food Choices to Help Relieve Diarrhea, Adult When you have diarrhea, the foods you eat and your eating habits are very important. Choosing the right foods and drinks can help:  Relieve diarrhea.  Replace lost fluids and nutrients.  Prevent dehydration.  What general guidelines should I follow? Relieving diarrhea  Choose foods with less than 2 g or .07 oz. of fiber per serving.  Limit fats to less than 8 tsp (38 g or 1.34 oz.) a day.  Avoid the following: ? Foods and beverages sweetened with high-fructose corn syrup, honey, or sugar alcohols such as xylitol, sorbitol, and mannitol. ? Foods that contain a lot of fat or sugar. ? Fried, greasy, or spicy foods. ? High-fiber grains, breads, and cereals. ? Raw fruits and vegetables.  Eat foods that are rich in probiotics. These foods include dairy products such as yogurt and fermented milk products. They help increase healthy bacteria in the stomach and intestines (gastrointestinal tract, or GI tract).  If you have lactose intolerance, avoid dairy products. These may make your diarrhea worse.  Take medicine to help stop diarrhea (antidiarrheal medicine) only as told by your health care provider. Replacing nutrients  Eat small meals or snacks every 3-4 hours.  Eat bland foods, such as white rice, toast, or baked potato, until your diarrhea starts to get better. Gradually reintroduce nutrient-rich foods as tolerated or as told by your health care provider. This includes: ? Well-cooked protein foods. ? Peeled, seeded, and soft-cooked fruits and vegetables. ? Low-fat dairy products.  Take vitamin and mineral supplements as told by your health care provider. Preventing dehydration   Start by sipping water or a special solution to prevent dehydration (oral rehydration solution, ORS). Urine that is clear or pale yellow means that you are getting enough fluid.  Try to drink at least 8-10 cups of fluid each day to help replace lost  fluids.  You may add other liquids in addition to water, such as clear juice or decaffeinated sports drinks, as tolerated or as told by your health care provider.  Avoid drinks with caffeine, such as coffee, tea, or soft drinks.  Avoid alcohol. What foods are recommended? The items listed may not be a complete list. Talk with your health care provider about what dietary choices are best for you. Grains White rice. White, French, or pita breads (fresh or toasted), including plain rolls, buns, or bagels. White pasta. Saltine, soda, or graham crackers. Pretzels. Low-fiber cereal. Cooked cereals made with water (such as cornmeal, farina, or cream cereals). Plain muffins. Matzo. Melba toast. Zwieback. Vegetables Potatoes (without the skin). Most well-cooked and canned vegetables without skins or seeds. Tender lettuce. Fruits Apple sauce. Fruits canned in juice. Cooked apricots, cherries, grapefruit, peaches, pears, or plums. Fresh bananas and cantaloupe. Meats and other protein foods Baked or boiled chicken. Eggs. Tofu. Fish. Seafood. Smooth nut butters. Ground or well-cooked tender beef, ham, veal, lamb, pork, or poultry. Dairy Plain yogurt, kefir, and unsweetened liquid yogurt. Lactose-free milk, buttermilk, skim milk, or soy milk. Low-fat or nonfat hard cheese. Beverages Water. Low-calorie sports drinks. Fruit juices without pulp. Strained tomato and vegetable juices. Decaffeinated teas. Sugar-free beverages not sweetened with sugar alcohols. Oral rehydration solutions, if approved by your health care provider. Seasoning and other foods Bouillon, broth, or soups made from recommended foods. What foods are not recommended? The items listed may not be a complete list. Talk with your health care provider about what dietary choices are best for you. Grains Whole grain, whole wheat,   bran, or rye breads, rolls, pastas, and crackers. Wild or brown rice. Whole grain or bran cereals. Barley. Oats and  oatmeal. Corn tortillas or taco shells. Granola. Popcorn. Vegetables Raw vegetables. Fried vegetables. Cabbage, broccoli, Brussels sprouts, artichokes, baked beans, beet greens, corn, kale, legumes, peas, sweet potatoes, and yams. Potato skins. Cooked spinach and cabbage. Fruits Dried fruit, including raisins and dates. Raw fruits. Stewed or dried prunes. Canned fruits with syrup. Meat and other protein foods Fried or fatty meats. Deli meats. Chunky nut butters. Nuts and seeds. Beans and lentils. Bacon. Hot dogs. Sausage. Dairy High-fat cheeses. Whole milk, chocolate milk, and beverages made with milk, such as milk shakes. Half-and-half. Cream. sour cream. Ice cream. Beverages Caffeinated beverages (such as coffee, tea, soda, or energy drinks). Alcoholic beverages. Fruit juices with pulp. Prune juice. Soft drinks sweetened with high-fructose corn syrup or sugar alcohols. High-calorie sports drinks. Fats and oils Butter. Cream sauces. Margarine. Salad oils. Plain salad dressings. Olives. Avocados. Mayonnaise. Sweets and desserts Sweet rolls, doughnuts, and sweet breads. Sugar-free desserts sweetened with sugar alcohols such as xylitol and sorbitol. Seasoning and other foods Honey. Hot sauce. Chili powder. Gravy. Cream-based or milk-based soups. Pancakes and waffles. Summary  When you have diarrhea, the foods you eat and your eating habits are very important.  Make sure you get at least 8-10 cups of fluid each day, or enough to keep your urine clear or pale yellow.  Eat bland foods and gradually reintroduce healthy, nutrient-rich foods as tolerated, or as told by your health care provider.  Avoid high-fiber, fried, greasy, or spicy foods. This information is not intended to replace advice given to you by your health care provider. Make sure you discuss any questions you have with your health care provider. Document Released: 01/07/2004 Document Revised: 10/14/2016 Document Reviewed:  10/14/2016 Elsevier Interactive Patient Education  2017 Elsevier Inc.  

## 2017-08-15 NOTE — Progress Notes (Signed)
Subjective:     Patient ID: [name], male   DOB: 03-07-1936, 81 y.o.   MRN: [id number]  HPI Patient history of depression is currently Cymbalta 60 mg daily. He still had some depressive symptoms intermittently and feels he is not in full remission. His daughter-in-law psychiatrist and they have recommended a genetic test to help determine which may be the most appropriate medication for him. He denies any suicidal ideation. Has lost a few pounds from last visit. Poor appetite at times. No abdominal pain.  New issue of 8-10 day history of more frequent loose stools. About 4 per day. He initially did have some diarrhea on Cymbalta but has been on this for several months now. No abdominal pain. No fevers or chills. No recent travels. No recent antibiotics. He had colonoscopy 2012 was normal.  He's not had any watery stools just more loose stools. No other change of medication. He has history of chronic borderline anemia. No history of lactose intolerance. No family history of celiac disease. He does have questions about gluten sensitivity and whether he may have something like that. He is not aware of being screened previously  Past Medical History:  Diagnosis Date  . Anxiety   . BPH (benign prostatic hypertrophy)   . GERD (gastroesophageal reflux disease)   . Sleep apnea    Past Surgical History:  Procedure Laterality Date  . NASAL SEPTOPLASTY W/ TURBINOPLASTY  08/03/2012   Procedure: NASAL SEPTOPLASTY WITH TURBINATE REDUCTION;  Surgeon: Izora Gala, MD;  Location: [place];  Service: ENT;  Laterality: Bilateral;    reports that he has quit smoking. He has never used smokeless tobacco. He reports that he drinks about 0.6 oz of alcohol per week . He reports that he does not use drugs. family history includes Heart attack in his father; Hypertension in his daughter and father. No Known Allergies   Review of Systems  Constitutional: Positive for appetite change. Negative for chills and  fever.  Respiratory: Negative for shortness of breath.   Cardiovascular: Negative for chest pain.  Gastrointestinal: Positive for diarrhea. Negative for abdominal pain, blood in stool, nausea and vomiting.  Neurological: Negative for dizziness and syncope.  Psychiatric/Behavioral: Positive for dysphoric mood. Negative for suicidal ideas.       Objective:   Physical Exam  Constitutional: He appears well-developed and well-nourished.  Neck: Neck supple. No thyromegaly present.  Cardiovascular: Normal rate and regular rhythm.   Pulmonary/Chest: Effort normal and breath sounds normal. No respiratory distress. He has no wheezes. He has no rales.  Abdominal: Soft. Bowel sounds are normal. He exhibits no distension. There is no tenderness. There is no guarding.  Musculoskeletal: He exhibits no edema.       Assessment:     #1 history recurrent depression currently on Cymbalta.  #2 recent history of frequent loose stools.    Plan:     -Check labs with basic metabolic panel, CBC, TSH, celiac antibody panel -Discussed appropriate diet for diarrhea. Avoid high fat or high glucose containing foods -Consider short-term trial of Imodium if stool frequency increases -Touch base in one week if diarrhea not improving  [name] MD [facility] at [facility]

## 2017-08-16 LAB — CELIAC DISEASE COMPREHENSIVE PANEL WITH REFLEXES
(tTG) Ab, IgA: 1 U/mL
Immunoglobulin A: 423 mg/dL (ref 81–463)

## 2017-08-17 DIAGNOSIS — D509 Iron deficiency anemia, unspecified: Secondary | ICD-10-CM | POA: Diagnosis not present

## 2017-08-17 DIAGNOSIS — R194 Change in bowel habit: Secondary | ICD-10-CM | POA: Diagnosis not present

## 2017-08-17 DIAGNOSIS — R140 Abdominal distension (gaseous): Secondary | ICD-10-CM | POA: Diagnosis not present

## 2017-08-17 DIAGNOSIS — Z86010 Personal history of colonic polyps: Secondary | ICD-10-CM | POA: Diagnosis not present

## 2017-08-21 DIAGNOSIS — M5136 Other intervertebral disc degeneration, lumbar region: Secondary | ICD-10-CM | POA: Diagnosis not present

## 2017-08-21 DIAGNOSIS — M5416 Radiculopathy, lumbar region: Secondary | ICD-10-CM | POA: Diagnosis not present

## 2017-08-21 DIAGNOSIS — R030 Elevated blood-pressure reading, without diagnosis of hypertension: Secondary | ICD-10-CM | POA: Diagnosis not present

## 2017-08-21 DIAGNOSIS — M5126 Other intervertebral disc displacement, lumbar region: Secondary | ICD-10-CM | POA: Diagnosis not present

## 2017-08-21 DIAGNOSIS — M47816 Spondylosis without myelopathy or radiculopathy, lumbar region: Secondary | ICD-10-CM | POA: Diagnosis not present

## 2017-09-06 ENCOUNTER — Ambulatory Visit: Payer: Medicare Other | Admitting: Family Medicine

## 2017-09-06 ENCOUNTER — Encounter: Payer: Self-pay | Admitting: Family Medicine

## 2017-09-06 VITALS — BP 140/80 | HR 77 | Temp 98.2°F | Wt 138.2 lb

## 2017-09-06 DIAGNOSIS — F339 Major depressive disorder, recurrent, unspecified: Secondary | ICD-10-CM | POA: Diagnosis not present

## 2017-09-06 DIAGNOSIS — R197 Diarrhea, unspecified: Secondary | ICD-10-CM | POA: Diagnosis not present

## 2017-09-06 MED ORDER — DULOXETINE HCL 30 MG PO CPEP: 30.0000 mg | ORAL_CAPSULE | Freq: Every day | ORAL | 0 refills | Status: DC

## 2017-09-06 MED ORDER — DESVENLAFAXINE SUCCINATE ER 50 MG PO TB24: 50.0000 mg | ORAL_TABLET | Freq: Every day | ORAL | 11 refills | Status: DC

## 2017-09-06 NOTE — Patient Instructions (Signed)
Decrease the Cymbalta to 30 mg daily for one week and then discontinue Go ahead and start the Pristiq.

## 2017-09-06 NOTE — Progress Notes (Signed)
Subjective:     Patient ID: [name], male   DOB: 06-24-1936, 81 y.o.   MRN: [id number]  HPI Patient seen for follow-up for loose stools and depression. He went to his gastroenterologist who treated him with Xifaxan for several days without much improvement.   Patient has history of recurrent depression currently doing well on Cymbalta 60 mg daily. Concerned whether this could be triggering his loose stools. His son who is a psychiatrist has suggested possible change of medication. He had recent genetic testing which looks at matching up specific psychotropic drugs with genetics and Pristiq and Cymbalta were favored on his list.  He had several recent labs which were unremarkable. Celiac panel negative Continues having about 3-4 loose stools per day. No bloody stools. No abdominal pain. No fever.  Past Medical History:  Diagnosis Date  . Anxiety   . BPH (benign prostatic hypertrophy)   . GERD (gastroesophageal reflux disease)   . Sleep apnea    No past surgical history on file.  reports that he has quit smoking. he has never used smokeless tobacco. He reports that he drinks about 0.6 oz of alcohol per week. He reports that he does not use drugs. family history includes Heart attack in his father; Hypertension in his daughter and father. No Known Allergies   Review of Systems  Constitutional: Negative for appetite change, chills and fever.  Cardiovascular: Negative for chest pain.  Gastrointestinal: Positive for diarrhea. Negative for abdominal pain, blood in stool, nausea and vomiting.  Neurological: Negative for dizziness and headaches.  Psychiatric/Behavioral: Negative for agitation, confusion and suicidal ideas.       Objective:   Physical Exam  Constitutional: He appears well-developed and well-nourished.  Cardiovascular: Normal rate and regular rhythm.  Pulmonary/Chest: Effort normal and breath sounds normal. No respiratory distress. He has no wheezes. He has no rales.   Abdominal: Soft. Bowel sounds are normal. He exhibits no distension and no mass. There is no tenderness. There is no rebound and no guarding.       Assessment:     #1 recent frequent loose stools possibly related to Cymbalta  #2 history of recurrent depression    Plan:     -Will taper back Cymbalta to 30 mg once daily for 1 week and then discontinue -Start Pristiq 50 mg once daily -Follow-up in 3 weeks to reassess  [name] MD [facility] at [facility]

## 2017-09-13 ENCOUNTER — Ambulatory Visit: Payer: Medicare Other | Admitting: Adult Health

## 2017-09-13 ENCOUNTER — Encounter: Payer: Self-pay | Admitting: Adult Health

## 2017-09-13 VITALS — BP 132/70 | Temp 98.2°F | Wt 138.0 lb

## 2017-09-13 DIAGNOSIS — R350 Frequency of micturition: Secondary | ICD-10-CM | POA: Diagnosis not present

## 2017-09-13 DIAGNOSIS — R319 Hematuria, unspecified: Secondary | ICD-10-CM | POA: Diagnosis not present

## 2017-09-13 DIAGNOSIS — R300 Dysuria: Secondary | ICD-10-CM

## 2017-09-13 DIAGNOSIS — N390 Urinary tract infection, site not specified: Secondary | ICD-10-CM

## 2017-09-13 LAB — POC URINALSYSI DIPSTICK (AUTOMATED)
BILIRUBIN UA: NEGATIVE
Blood, UA: 1
Glucose, UA: NEGATIVE
Ketones, UA: NEGATIVE
LEUKOCYTES UA: NEGATIVE
NITRITE UA: POSITIVE
PH UA: 6.5 (ref 5.0–8.0)
PROTEIN UA: NEGATIVE
Spec Grav, UA: 1.02 (ref 1.010–1.025)
Urobilinogen, UA: 0.2 E.U./dL

## 2017-09-13 MED ORDER — AMPICILLIN 500 MG PO CAPS: 500.0000 mg | ORAL_CAPSULE | Freq: Three times a day (TID) | ORAL | 0 refills | Status: AC

## 2017-09-13 NOTE — Progress Notes (Signed)
Subjective:    Patient ID: [name], male    DOB: 05-20-1936, 81 y.o.   MRN: [id number]  HPI   81 year old male who  has a past medical history of Anxiety, BPH (benign prostatic hypertrophy), GERD (gastroesophageal reflux disease), and Sleep apnea. He presents to the office today with the acute complaint of UTI like symptoms. His symptoms have been present for the last 2-3 days. His symptoms include that of frequency, urgency, dysuria, and episodes of hematuria. Symptoms have been worse over the last 2 days.   He denies fevers, abdominal pain, rectal pain, testicular pain or low back pain. He does not feel ill.    Review of Systems See HPI  Past Medical History:  Diagnosis Date  . Anxiety   . BPH (benign prostatic hypertrophy)   . GERD (gastroesophageal reflux disease)   . Sleep apnea     Social History   Socioeconomic History  . Marital status: Married    Spouse name: Not on file  . Number of children: Not on file  . Years of education: Not on file  . Highest education level: Not on file  Social Needs  . Financial resource strain: Not on file  . Food insecurity - worry: Not on file  . Food insecurity - inability: Not on file  . Transportation needs - medical: Not on file  . Transportation needs - non-medical: Not on file  Occupational History  . Not on file  Tobacco Use  . Smoking status: Former Research scientist (life sciences)  . Smokeless tobacco: Never Used  . Tobacco comment: rare use over 50 years   Substance and Sexual Activity  . Alcohol use: Yes    Alcohol/week: 0.6 oz    Types: 1 Shots of liquor per week    Comment: QOD; at times  . Drug use: No  . Sexual activity: Not on file  Other Topics Concern  . Not on file  Social History Narrative  . Not on file    History reviewed. No pertinent surgical history.  Family History  Problem Relation Age of Onset  . Heart attack Father   . Hypertension Father   . Hypertension Daughter     No Known Allergies  Current  Outpatient Medications on File Prior to Visit  Medication Sig Dispense Refill  . aspirin 81 MG chewable tablet Chew 81 mg by mouth daily.    . clonazePAM (KLONOPIN) 0.5 MG tablet Take 1 tablet (0.5 mg total) by mouth 2 (two) times daily as needed for anxiety. 30 tablet 0  . desvenlafaxine (PRISTIQ) 50 MG 24 hr tablet Take 1 tablet (50 mg total) daily by mouth. 30 tablet 11  . hydrocortisone 2.5 % cream Apply topically 2 (two) times daily. 30 g 1  . omeprazole (PRILOSEC) 40 MG capsule TAKE 1 CAPSULE BY MOUTH  DAILY 90 capsule 2  . RA VITAMIN B12 2000 MCG TBCR take 1 tablet by mouth daily 30 tablet 5  . terazosin (HYTRIN) 5 MG capsule TAKE 1 CAPSULE BY MOUTH  DAILY 90 capsule 1  . DULoxetine (CYMBALTA) 30 MG capsule Take 1 capsule (30 mg total) daily by mouth. (Patient not taking: Reported on 09/13/2017) 7 capsule 0   No current facility-administered medications on file prior to visit.     BP 132/70 (BP Location: Left Arm)   Temp 98.2 F (36.8 C) (Oral)   Wt 138 lb (62.6 kg)   BMI 21.61 kg/m       Objective:  Physical Exam  Constitutional: He is oriented to person, place, and time. He appears well-developed and well-nourished. No distress.  Cardiovascular: Normal rate, regular rhythm, normal heart sounds and intact distal pulses. Exam reveals no gallop and no friction rub.  No murmur heard. Pulmonary/Chest: Effort normal and breath sounds normal. No respiratory distress. He has no wheezes. He has no rales. He exhibits no tenderness.  Abdominal: There is no hepatosplenomegaly, splenomegaly or hepatomegaly. There is no CVA tenderness.  Neurological: He is alert and oriented to person, place, and time.  Skin: Skin is warm and dry. No rash noted. He is not diaphoretic. No erythema. No pallor.  Psychiatric: He has a normal mood and affect. His behavior is normal. Judgment and thought content normal.  Nursing note and vitals reviewed.     Assessment & Plan:  1. Urinary tract infection  with hematuria, site unspecified - Will treat with Ampicillin due to pts last urine culture.  - POCT Urinalysis Dipstick (Automated) + leuks, nit, and blood - Urine Culture - ampicillin (PRINCIPEN) 500 MG capsule; Take 1 capsule (500 mg total) 3 (three) times daily for 7 days by mouth.  Dispense: 21 capsule; Refill: 0  Dorothyann Peng, NP

## 2017-09-14 LAB — URINE CULTURE
MICRO NUMBER:: 81284138
SPECIMEN QUALITY:: ADEQUATE

## 2017-10-03 ENCOUNTER — Other Ambulatory Visit: Payer: Self-pay | Admitting: Family Medicine

## 2017-10-13 ENCOUNTER — Ambulatory Visit: Payer: Medicare Other | Admitting: Family Medicine

## 2017-10-13 ENCOUNTER — Encounter: Payer: Self-pay | Admitting: Family Medicine

## 2017-10-13 VITALS — BP 160/80 | HR 57 | Temp 98.1°F | Wt 139.5 lb

## 2017-10-13 DIAGNOSIS — E538 Deficiency of other specified B group vitamins: Secondary | ICD-10-CM | POA: Diagnosis not present

## 2017-10-13 DIAGNOSIS — R531 Weakness: Secondary | ICD-10-CM

## 2017-10-13 DIAGNOSIS — R030 Elevated blood-pressure reading, without diagnosis of hypertension: Secondary | ICD-10-CM | POA: Diagnosis not present

## 2017-10-13 DIAGNOSIS — R5383 Other fatigue: Secondary | ICD-10-CM

## 2017-10-13 NOTE — Progress Notes (Signed)
Subjective:     Patient ID: [name], male   DOB: 1936-04-14, 81 y.o.   MRN: [id number]  HPI Patient seen with complaints of fatigue for the past 3 weeks. We did start him on a new antidepressant ,Pristiq, about a month ago. He has not had any nausea. No vomiting. Denies any diarrhea, chest pains, dyspnea, fever, night sweats, headaches. He feels he is sleeping fairly well. No appetite changes. No weight changes. He had TSH last October which was normal. Does have history of B12 deficiency and is on oral placement. Has not had a level about 3 years now. He is on chronic omeprazole. He feels his depression is improved.  Denies any focal weakness.  Past Medical History:  Diagnosis Date  . Anxiety   . BPH (benign prostatic hypertrophy)   . GERD (gastroesophageal reflux disease)   . Sleep apnea    Past Surgical History:  Procedure Laterality Date  . NASAL SEPTOPLASTY W/ TURBINOPLASTY  08/03/2012   Procedure: NASAL SEPTOPLASTY WITH TURBINATE REDUCTION;  Surgeon: Izora Gala, MD;  Location: [facility];  Service: ENT;  Laterality: Bilateral;    reports that he has quit smoking. he has never used smokeless tobacco. He reports that he drinks about 0.6 oz of alcohol per week. He reports that he does not use drugs. family history includes Heart attack in his father; Hypertension in his daughter and father. No Known Allergies   Review of Systems  Constitutional: Positive for fatigue. Negative for chills, fever and unexpected weight change.  Eyes: Negative for visual disturbance.  Respiratory: Negative for cough, chest tightness and shortness of breath.   Cardiovascular: Negative for chest pain, palpitations and leg swelling.  Gastrointestinal: Negative for abdominal pain.  Genitourinary: Negative for dysuria.  Neurological: Negative for dizziness, syncope, weakness, light-headedness and headaches.  Psychiatric/Behavioral: Negative for dysphoric mood.       Objective:   Physical Exam   Constitutional: He is oriented to person, place, and time. He appears well-developed and well-nourished.  HENT:  Right Ear: External ear normal.  Left Ear: External ear normal.  Mouth/Throat: Oropharynx is clear and moist.  Eyes: Pupils are equal, round, and reactive to light.  Neck: Neck supple. No thyromegaly present.  Cardiovascular: Normal rate and regular rhythm.  Pulmonary/Chest: Effort normal and breath sounds normal. No respiratory distress. He has no wheezes. He has no rales.  Musculoskeletal: He exhibits no edema.  Lymphadenopathy:    He has no cervical adenopathy.  Neurological: He is alert and oriented to person, place, and time.  Skin: No rash noted.       Assessment:     #1 fatigue. Relatively acute onset 3 weeks ago. Nonfocal exam. Etiology unclear. Did have recent initiation with new antidepressant medication. Doubt electrolyte abnormalities but need to rule out  #2 elevated blood pressure. Repeat reading after rest today 160/80. This could potentially be causing some of his issues with #1    Plan:     -Check further labs with CBC, comprehensive metabolic panel, T55 level -Monitor blood pressure closely over the weekend and record readings. Return next Tuesday and bring his cuff and home readings for further evaluation that time. If blood pressure not further improved at that time consider initiating blood pressure medication  [name] MD [facility] at [facility]

## 2017-10-13 NOTE — Patient Instructions (Signed)
Monitor blood pressure at home and record readings.

## 2017-10-14 LAB — COMPREHENSIVE METABOLIC PANEL
AG Ratio: 1.5 (calc) (ref 1.0–2.5)
ALT: 15 U/L (ref 9–46)
AST: 13 U/L (ref 10–35)
Albumin: 3.8 g/dL (ref 3.6–5.1)
Alkaline phosphatase (APISO): 62 U/L (ref 40–115)
BUN: 21 mg/dL (ref 7–25)
CHLORIDE: 103 mmol/L (ref 98–110)
CO2: 25 mmol/L (ref 20–32)
CREATININE: 0.89 mg/dL (ref 0.70–1.18)
Calcium: 8.7 mg/dL (ref 8.6–10.3)
GLOBULIN: 2.5 g/dL (ref 1.9–3.7)
GLUCOSE: 109 mg/dL — AB (ref 65–99)
Potassium: 4.9 mmol/L (ref 3.5–5.3)
Sodium: 138 mmol/L (ref 135–146)
Total Bilirubin: 0.3 mg/dL (ref 0.2–1.2)
Total Protein: 6.3 g/dL (ref 6.1–8.1)

## 2017-10-14 LAB — VITAMIN B12: Vitamin B-12: 1900 pg/mL — ABNORMAL HIGH (ref 200–1100)

## 2017-10-14 LAB — CBC WITH DIFFERENTIAL/PLATELET
BASOS ABS: 41 {cells}/uL (ref 0–200)
Basophils Relative: 0.6 %
EOS PCT: 2 %
Eosinophils Absolute: 138 cells/uL (ref 15–500)
HEMATOCRIT: 36.2 % — AB (ref 38.5–50.0)
Hemoglobin: 11.6 g/dL — ABNORMAL LOW (ref 13.2–17.1)
LYMPHS ABS: 2063 {cells}/uL (ref 850–3900)
MCH: 25.6 pg — ABNORMAL LOW (ref 27.0–33.0)
MCHC: 32 g/dL (ref 32.0–36.0)
MCV: 79.9 fL — AB (ref 80.0–100.0)
MPV: 11.5 fL (ref 7.5–12.5)
Monocytes Relative: 11.6 %
NEUTROS PCT: 55.9 %
Neutro Abs: 3857 cells/uL (ref 1500–7800)
PLATELETS: 250 10*3/uL (ref 140–400)
RBC: 4.53 10*6/uL (ref 4.20–5.80)
RDW: 13 % (ref 11.0–15.0)
TOTAL LYMPHOCYTE: 29.9 %
WBC mixed population: 800 cells/uL (ref 200–950)
WBC: 6.9 10*3/uL (ref 3.8–10.8)

## 2017-10-17 ENCOUNTER — Encounter: Payer: Self-pay | Admitting: Family Medicine

## 2017-10-17 ENCOUNTER — Ambulatory Visit: Payer: Medicare Other | Admitting: Family Medicine

## 2017-10-17 VITALS — BP 138/78 | HR 84 | Temp 97.8°F | Wt 137.4 lb

## 2017-10-17 DIAGNOSIS — F339 Major depressive disorder, recurrent, unspecified: Secondary | ICD-10-CM | POA: Diagnosis not present

## 2017-10-17 DIAGNOSIS — R5383 Other fatigue: Secondary | ICD-10-CM

## 2017-10-17 DIAGNOSIS — R030 Elevated blood-pressure reading, without diagnosis of hypertension: Secondary | ICD-10-CM

## 2017-10-17 DIAGNOSIS — D509 Iron deficiency anemia, unspecified: Secondary | ICD-10-CM

## 2017-10-17 DIAGNOSIS — F411 Generalized anxiety disorder: Secondary | ICD-10-CM | POA: Diagnosis not present

## 2017-10-17 LAB — FERRITIN: Ferritin: 10.5 ng/mL — ABNORMAL LOW (ref 22.0–322.0)

## 2017-10-17 MED ORDER — HYDROCORTISONE 2.5 % EX CREA: TOPICAL_CREAM | Freq: Two times a day (BID) | CUTANEOUS | 1 refills | Status: AC | PRN

## 2017-10-17 MED ORDER — CLONAZEPAM 0.5 MG PO TABS: 0.5000 mg | ORAL_TABLET | Freq: Two times a day (BID) | ORAL | 0 refills | Status: DC | PRN

## 2017-10-17 NOTE — Progress Notes (Signed)
Subjective:     Patient ID: [name], male   DOB: 28-Feb-1936, 81 y.o.   MRN: [id number]  HPI Patient seen for follow-up regarding recent fatigue. He has history of depression which he feels improved on Pristiq. We attained some labs to rule out any significant abnormalities such as electrolyte disturbance such as hyponatremia. His chemistries were normal. His hemoglobin is 11.6 with low MCV. He's had tendency toward some chronic low-grade anemia in the past. We ordered Hemoccult cards along with TIBC, serum iron, and ferritin and is here to get these drawn today. He states his fatigue is better at this time.  Wife thinks some of this may stress-related. He has one son who lives in [place] he's had some financial stressors and she thinks he is experiencing some stress about this. He has seen psychotherapist the past had some counseling try to help with anxiety issues. He takes very infrequent Klonopin for severe anxiety symptoms. Generally sleeping well  Recent elevated blood pressure. He has home machine with him today and we are getting consistent readings here. He has fairly good control at home.  Past Medical History:  Diagnosis Date  . Anxiety   . BPH (benign prostatic hypertrophy)   . GERD (gastroesophageal reflux disease)   . Sleep apnea    Past Surgical History:  Procedure Laterality Date  . NASAL SEPTOPLASTY W/ TURBINOPLASTY  08/03/2012   Procedure: NASAL SEPTOPLASTY WITH TURBINATE REDUCTION;  Surgeon: Izora Gala, MD;  Location: [facility];  Service: ENT;  Laterality: Bilateral;    reports that he has quit smoking. he has never used smokeless tobacco. He reports that he drinks about 0.6 oz of alcohol per week. He reports that he does not use drugs. family history includes Heart attack in his father; Hypertension in his daughter and father. No Known Allergies     Review of Systems  Constitutional: Negative for fatigue.  Eyes: Negative for visual disturbance.  Respiratory:  Negative for cough, chest tightness and shortness of breath.   Cardiovascular: Negative for chest pain, palpitations and leg swelling.  Endocrine: Negative for polydipsia and polyuria.  Neurological: Negative for dizziness, syncope, weakness, light-headedness and headaches.       Objective:   Physical Exam  Constitutional: He is oriented to person, place, and time. He appears well-developed and well-nourished.  HENT:  Right Ear: External ear normal.  Left Ear: External ear normal.  Mouth/Throat: Oropharynx is clear and moist.  Eyes: Pupils are equal, round, and reactive to light.  Neck: Neck supple. No thyromegaly present.  Cardiovascular: Normal rate and regular rhythm.  Pulmonary/Chest: Effort normal and breath sounds normal. No respiratory distress. He has no wheezes. He has no rales.  Musculoskeletal: He exhibits no edema.  Neurological: He is alert and oriented to person, place, and time.       Assessment:     #1 recent fatigue symptomatically improved today  #2 history of recurrent depression currently stable on Pristiq  #3 mild microcytic anemia. This appears to be somewhat chronic-question thalassemia minor  #4 recent mild elevated blood pressure. Repeat after rest 138/78  #5 history of chronic intermittent anxiety      Plan:     -Recheck labs including serum ferritin, TIBC, iron and Hemoccult cards  -He has schedule repeat colonoscopy in January already with GI  -We discussed nonpharmacologic ways of handling stress and anxiety such as exercise and adequate sleep and reduction of caffeine.  -1 refill of Klonopin 0.5 mg which he uses very  infrequently for severe anxiety breakthrough symptoms  -Continue Pristiq   [name] MD [facility] at [facility]

## 2017-10-18 LAB — IRON,TIBC AND FERRITIN PANEL
%SAT: 16 % (ref 15–60)
Ferritin: 14 ng/mL — ABNORMAL LOW (ref 20–380)
Iron: 64 ug/dL (ref 50–180)
TIBC: 392 mcg/dL (calc) (ref 250–425)

## 2017-10-19 ENCOUNTER — Ambulatory Visit: Payer: Self-pay | Admitting: *Deleted

## 2017-10-19 NOTE — Telephone Encounter (Signed)
Pt states he has been using home monitor to measure BPs for the last couple of days and the SBP have been in the 160s. BP while on the phone with the nurse is 168/91 and P-81. Pt denies any symptoms of headache, blurry vision or any other changes. Pt does not have a history of HTN .Pt was recently seen in office on 12/18.   Reason for Disposition . [4] Systolic BP  >= 360 OR Diastolic >= 90 AND [6] not taking BP medications  Answer Assessment - Initial Assessment Questions 1. BLOOD PRESSURE: "What is the blood pressure?" "Did you take at least two measurements 5 minutes apart?"     168/91 and pulse 81 2. ONSET: "When did you take your blood pressure?"     While on the phone with nurse 3. HOW: "How did you obtain the blood pressure?" (e.g., visiting nurse, automatic home BP monitor)     Automatic home monitor 4. HISTORY: "Do you have a history of high blood pressure?"     No 5. MEDICATIONS: "Are you taking any medications for blood pressure?" "Have you missed any doses recently?"     No 6. OTHER SYMPTOMS: "Do you have any symptoms?" (e.g., headache, chest pain, blurred vision, difficulty breathing, weakness)     No  Protocols used: HIGH BLOOD PRESSURE-A-AH

## 2017-10-20 MED ORDER — AMLODIPINE BESYLATE 5 MG PO TABS: 5.0000 mg | ORAL_TABLET | Freq: Every day | ORAL | 1 refills | Status: DC

## 2017-10-20 NOTE — Addendum Note (Signed)
Addended by: Denman George on: 10/20/2017 03:54 PM   Modules accepted: Orders

## 2017-10-20 NOTE — Telephone Encounter (Signed)
Left message on machine for patient to return our call 

## 2017-10-20 NOTE — Telephone Encounter (Signed)
Start Amlodipine 5 mg po qd and office follow up in 2-3 weeks to reassess.

## 2017-10-20 NOTE — Telephone Encounter (Signed)
Pt. Returned call to office.  Advised that Dr. Elease Hashimoto is recommending to start taking Amlodipine 5 mg, 1 tablet, daily, and f/u in the office in 2-3 weeks.  Advised will order medication at pharmacy; pt. prefers the medication to be sent to [facility] [address].  Encouraged to take medication at the same time daily.  Appt. given for 11/03/17 @ 10:30 AM.  Pt. verb. understanding; agrees with plan.

## 2017-10-23 LAB — POC HEMOCCULT BLD/STL (HOME/3-CARD/SCREEN)
Card #3 Fecal Occult Blood, POC: NEGATIVE
FECAL OCCULT BLD: NEGATIVE
FECAL OCCULT BLD: NEGATIVE

## 2017-10-23 NOTE — Addendum Note (Signed)
Addended by: Elmer Picker on: 10/23/2017 10:40 AM   Modules accepted: Orders

## 2017-10-25 ENCOUNTER — Encounter: Payer: Self-pay | Admitting: Family Medicine

## 2017-10-26 ENCOUNTER — Telehealth: Payer: Self-pay | Admitting: Family Medicine

## 2017-10-26 NOTE — Telephone Encounter (Signed)
Copied from [place] [id number]. Topic: Quick Communication - Lab Results >> Oct 25, 2017  5:10 PM Waylan Rocher, [place] L wrote: Notified patient regarding lab results that a cma would call him back to disclose them. He says he just missed a call.

## 2017-10-26 NOTE — Telephone Encounter (Signed)
I called the pt and informed him of the results.

## 2017-11-03 ENCOUNTER — Ambulatory Visit (INDEPENDENT_AMBULATORY_CARE_PROVIDER_SITE_OTHER): Payer: Medicare Other

## 2017-11-03 ENCOUNTER — Encounter: Payer: Self-pay | Admitting: Family Medicine

## 2017-11-03 ENCOUNTER — Ambulatory Visit: Payer: Medicare Other | Admitting: Family Medicine

## 2017-11-03 VITALS — BP 120/60 | HR 88 | Temp 97.8°F | Wt 137.5 lb

## 2017-11-03 VITALS — BP 120/60 | HR 88 | Ht 67.0 in | Wt 137.0 lb

## 2017-11-03 DIAGNOSIS — I10 Essential (primary) hypertension: Secondary | ICD-10-CM | POA: Diagnosis not present

## 2017-11-03 DIAGNOSIS — Z0000 Encounter for general adult medical examination without abnormal findings: Secondary | ICD-10-CM | POA: Diagnosis not present

## 2017-11-03 MED ORDER — DULOXETINE HCL 60 MG PO CPEP: 60.0000 mg | ORAL_CAPSULE | Freq: Every day | ORAL | 3 refills | Status: DC

## 2017-11-03 NOTE — Progress Notes (Signed)
Subjective:     Patient ID: [name], male   DOB: 1936/03/22, 82 y.o.   MRN: [id number]  HPI Here for follow-up hypertension. He is also going to see our health coach for Medicare Wellness visit.  He had couple recent elevations in December. Has had readings up in the 409-735H range systolic but no headaches. He takes amlodipine.  He's had history of recurrent depression and anxiety. He recently started tapering off Pristiq and started back Cymbalta. He initially when he went on Cymbalta had some GI issues and stopped after about a week but did feel the Cymbalta was helping his anxiety more. Requesting refills of Cymbalta  Past Medical History:  Diagnosis Date  . Anxiety   . BPH (benign prostatic hypertrophy)   . GERD (gastroesophageal reflux disease)   . Sleep apnea    Past Surgical History:  Procedure Laterality Date  . NASAL SEPTOPLASTY W/ TURBINOPLASTY  08/03/2012   Procedure: NASAL SEPTOPLASTY WITH TURBINATE REDUCTION;  Surgeon: Izora Gala, MD;  Location: [facility];  Service: ENT;  Laterality: Bilateral;    reports that he has quit smoking. he has never used smokeless tobacco. He reports that he drinks about 0.6 oz of alcohol per week. He reports that he does not use drugs. family history includes Heart attack in his father; Hypertension in his daughter and father. No Known Allergies   Review of Systems  Constitutional: Negative for fatigue.  Eyes: Negative for visual disturbance.  Respiratory: Negative for cough, chest tightness and shortness of breath.   Cardiovascular: Negative for chest pain, palpitations and leg swelling.  Neurological: Negative for dizziness, syncope, weakness, light-headedness and headaches.       Objective:   Physical Exam  Constitutional: He is oriented to person, place, and time. He appears well-developed and well-nourished.  HENT:  Right Ear: External ear normal.  Left Ear: External ear normal.  Mouth/Throat: Oropharynx is clear and moist.   Eyes: Pupils are equal, round, and reactive to light.  Neck: Neck supple. No thyromegaly present.  Cardiovascular: Normal rate and regular rhythm.  Pulmonary/Chest: Effort normal and breath sounds normal. No respiratory distress. He has no wheezes. He has no rales.  Musculoskeletal: He exhibits no edema.  Neurological: He is alert and oriented to person, place, and time.       Assessment:     #1 hypertension stable by today's reading  #2 history of recurrent depression and anxiety    Plan:     -Refilled Cymbalta 60 mg daily -Continue to monitor blood pressure closely and be in touch if consistently greater than 140/90 -Continue nonpharmacologic ways of handling stress and anxiety such as exercise  [name] MD [facility] at [facility]

## 2017-11-03 NOTE — Patient Instructions (Signed)
[name] , Thank you for taking time to come for your Medicare Wellness Visit. I appreciate your ongoing commitment to your health goals. Please review the following plan we discussed and let me know if I can assist you in the future.   Shingrix is a vaccine for the prevention of Shingles in Adults 50 and older.  If you are on Medicare, you can request a prescription from your doctor to be filled at a pharmacy.  Please check with your benefits regarding applicable copays or out of pocket expenses.  The Shingrix is given in 2 vaccines approx 8 weeks apart. You must receive the 2nd dose prior to 6 months from receipt of the first.    These are the goals we discussed: Goals    . Exercise 150 min/wk Moderate Activity     Start playing golf and getting back to your routine        This is a list of the screening recommended for you and due dates:  Health Maintenance  Topic Date Due  . Tetanus Vaccine  10/15/2023  . Flu Shot  Completed  . Pneumonia vaccines  Completed     Health Maintenance, Male A healthy lifestyle and preventive care is important for your health and wellness. Ask your health care provider about what schedule of regular examinations is right for you. What should I know about weight and diet? Eat a Healthy Diet  Eat plenty of vegetables, fruits, whole grains, low-fat dairy products, and lean protein.  Do not eat a lot of foods high in solid fats, added sugars, or salt.  Maintain a Healthy Weight Regular exercise can help you achieve or maintain a healthy weight. You should:  Do at least 150 minutes of exercise each week. The exercise should increase your heart rate and make you sweat (moderate-intensity exercise).  Do strength-training exercises at least twice a week.  Watch Your Levels of Cholesterol and Blood Lipids  Have your blood tested for lipids and cholesterol every 5 years starting at 80 years of age. If you are at high risk for heart disease, you  should start having your blood tested when you are 80 years old. You may need to have your cholesterol levels checked more often if: ? Your lipid or cholesterol levels are high. ? You are older than 80 years of age. ? You are at high risk for heart disease.  What should I know about cancer screening? Many types of cancers can be detected early and may often be prevented. Lung Cancer  You should be screened every year for lung cancer if: ? You are a current smoker who has smoked for at least 30 years. ? You are a former smoker who has quit within the past 15 years.  Talk to your health care provider about your screening options, when you should start screening, and how often you should be screened.  Colorectal Cancer  Routine colorectal cancer screening usually begins at 80 years of age and should be repeated every 5-10 years until you are 80 years old. You may need to be screened more often if early forms of precancerous polyps or small growths are found. Your health care provider may recommend screening at an earlier age if you have risk factors for colon cancer.  Your health care provider may recommend using home test kits to check for hidden blood in the stool.  A small camera at the end of a tube can be used to examine your colon (sigmoidoscopy  or colonoscopy). This checks for the earliest forms of colorectal cancer.  Prostate and Testicular Cancer  Depending on your age and overall health, your health care provider may do certain tests to screen for prostate and testicular cancer.  Talk to your health care provider about any symptoms or concerns you have about testicular or prostate cancer.  Skin Cancer  Check your skin from head to toe regularly.  Tell your health care provider about any new moles or changes in moles, especially if: ? There is a change in a mole's size, shape, or color. ? You have a mole that is larger than a pencil eraser.  Always use sunscreen. Apply  sunscreen liberally and repeat throughout the day.  Protect yourself by wearing long sleeves, pants, a wide-brimmed hat, and sunglasses when outside.  What should I know about heart disease, diabetes, and high blood pressure?  If you are 35-48 years of age, have your blood pressure checked every 3-5 years. If you are 45 years of age or older, have your blood pressure checked every year. You should have your blood pressure measured twice-once when you are at a hospital or clinic, and once when you are not at a hospital or clinic. Record the average of the two measurements. To check your blood pressure when you are not at a hospital or clinic, you can use: ? An automated blood pressure machine at a pharmacy. ? A home blood pressure monitor.  Talk to your health care provider about your target blood pressure.  If you are between 54-62 years old, ask your health care provider if you should take aspirin to prevent heart disease.  Have regular diabetes screenings by checking your fasting blood sugar level. ? If you are at a normal weight and have a low risk for diabetes, have this test once every three years after the age of 81. ? If you are overweight and have a high risk for diabetes, consider being tested at a younger age or more often.  A one-time screening for abdominal aortic aneurysm (AAA) by ultrasound is recommended for men aged 38-75 years who are current or former smokers. What should I know about preventing infection? Hepatitis B If you have a higher risk for hepatitis B, you should be screened for this virus. Talk with your health care provider to find out if you are at risk for hepatitis B infection. Hepatitis C Blood testing is recommended for:  Everyone born from [birth year] through 1963.  Anyone with known risk factors for hepatitis C.  Sexually Transmitted Diseases (STDs)  You should be screened each year for STDs including gonorrhea and chlamydia if: ? You are sexually active  and are younger than 80 years of age. ? You are older than 80 years of age and your health care provider tells you that you are at risk for this type of infection. ? Your sexual activity has changed since you were last screened and you are at an increased risk for chlamydia or gonorrhea. Ask your health care provider if you are at risk.  Talk with your health care provider about whether you are at high risk of being infected with HIV. Your health care provider may recommend a prescription medicine to help prevent HIV infection.  What else can I do?  Schedule regular health, dental, and eye exams.  Stay current with your vaccines (immunizations).  Do not use any tobacco products, such as cigarettes, chewing tobacco, and e-cigarettes. If you need help quitting, ask your  health care provider.  Limit alcohol intake to no more than 2 drinks per day. One drink equals 12 ounces of beer, 5 ounces of wine, or 1 ounces of hard liquor.  Do not use street drugs.  Do not share needles.  Ask your health care provider for help if you need support or information about quitting drugs.  Tell your health care provider if you often feel depressed.  Tell your health care provider if you have ever been abused or do not feel safe at home. This information is not intended to replace advice given to you by your health care provider. Make sure you discuss any questions you have with your health care provider. Document Released: 04/14/2008 Document Revised: 06/15/2016 Document Reviewed: 07/21/2015 Elsevier Interactive Patient Education  [employer].

## 2017-11-03 NOTE — Progress Notes (Addendum)
Subjective:   [name] is a 82 y.o. male who presents for Medicare Annual/Subsequent preventive examination.  In to see Dr. Elease Hashimoto this am who reviewed medical issues; labs and social history  [place]ives [place]  Will stay for now  Just moved here approx 1.5 yo Very nice neighbors  Grand kids  x 5 with a very busy holiday One son is a Teacher, music and nephew is a Investment banker, operational  Son in [place] moved to [place] and he misses his grand children  As 2 brothers in [place]    Diet  Appetite is good per his report BMI 21.5  From 22.6 last year     Exercise;  Had back issues but hopes to start playing golf  Goes to the gym; 2 to 3 times a week Weights and cardio   Colonoscopy in the next couple of months per the patient report by Dr. Collene Mares  Hearing no problems  Vision checks regularly by Dr. Bing Plume   No falls Safety reviewed from last AWV  Mood discussed by Dr. Elease Hashimoto today. Asked if Depression was an issues and the patient stated he is under treatment and doing better at this time.   Denies Cognitive issues and his wife agrees   Financial controller have been completed      Cardiac Risk Factors include: advanced age (>58men, >67 women);family history of premature cardiovascular disease;hypertension;male gender  Objective:    Vitals: BP 120/60   Pulse 88   Ht 5\' 7"  (1.702 m)   Wt 137 lb (62.1 kg)   SpO2 99%   BMI 21.46 kg/m   Body mass index is 21.46 kg/m.  Advanced Directives 11/03/2017 06/17/2016 07/27/2012  Does Patient Have a Medical Advance Directive? Yes Yes Patient has advance directive, copy not in chart  Type of Advance Directive - - Living will  [place] in Chart? - No - copy requested Copy requested from family    Tobacco Social History   Tobacco Use  Smoking Status Former Smoker  Smokeless Tobacco Never Used  Tobacco Comment   rare use over 50 years      Counseling given: Yes Comment: rare use over 50 years     Clinical Intake:    Past Medical History:  Diagnosis Date  . Anxiety   . BPH (benign prostatic hypertrophy)   . GERD (gastroesophageal reflux disease)   . Sleep apnea    Past Surgical History:  Procedure Laterality Date  . NASAL SEPTOPLASTY W/ TURBINOPLASTY  08/03/2012   Procedure: NASAL SEPTOPLASTY WITH TURBINATE REDUCTION;  Surgeon: Izora Gala, MD;  [facility];  Service: ENT;  Laterality: Bilateral;   Family History  Problem Relation Age of Onset  . Heart attack Father   . Hypertension Father   . Hypertension Daughter    Social History   Socioeconomic History  . Marital status: Married    Spouse name: Not on file  . Number of children: Not on file  . Years of education: Not on file  . Highest education level: Not on file  Social Needs  . Financial resource strain: Not on file  . Food insecurity - worry: Not on file  . Food insecurity - inability: Not on file  . Transportation needs - medical: Not on file  . Transportation needs - non-medical: Not on file  Occupational History  . Not on file  Tobacco Use  . Smoking status: Former Research scientist (life sciences)  . Smokeless tobacco: Never Used  .  Tobacco comment: rare use over 50 years   Substance and Sexual Activity  . Alcohol use: Yes    Alcohol/week: 0.6 oz    Types: 1 Shots of liquor per week    Comment: QOD; at times  . Drug use: No  . Sexual activity: Not on file  Other Topics Concern  . Not on file  Social History Narrative   Lives in 2 level    Will stay for now    Just moved here approx 1.5 yo   Very nice neighbors    [employer]  x 5 with a very busy holiday   One son is a Teacher, music and nephew is a Investment banker, operational    Son in [place] moved to [place] and he misses his grand children    As 2 brothers in [place] Encounter Medications as of 11/03/2017  Medication Sig  . amLODipine (NORVASC) 5 MG tablet Take 1 tablet (5 mg total) by mouth daily.  Marland Kitchen aspirin 81 MG chewable tablet Chew 81 mg by mouth  daily.  . clonazePAM (KLONOPIN) 0.5 MG tablet Take 1 tablet (0.5 mg total) by mouth 2 (two) times daily as needed for anxiety.  . DULoxetine (CYMBALTA) 60 MG capsule Take 1 capsule (60 mg total) by mouth daily.  . hydrocortisone 2.5 % cream Apply topically 2 (two) times daily as needed.  Marland Kitchen omeprazole (PRILOSEC) 40 MG capsule TAKE 1 CAPSULE BY MOUTH  DAILY  . RA VITAMIN B12 2000 MCG TBCR take 1 tablet by mouth daily  . terazosin (HYTRIN) 5 MG capsule TAKE 1 CAPSULE BY MOUTH  DAILY   No facility-administered encounter medications on file as of 11/03/2017.     Activities of Daily Living In your present state of health, do you have any difficulty performing the following activities: 11/03/2017  Hearing? N  Vision? N  Difficulty concentrating or making decisions? N  Walking or climbing stairs? N  Dressing or bathing? N  Doing errands, shopping? N  Preparing Food and eating ? N  Using the Toilet? N  Managing your Medications? N  Managing your Finances? N  Housekeeping or managing your Housekeeping? N  Some recent data might be hidden    Patient Care Team: [name], MD as PCP - General (Family Medicine)   Assessment:   This is a routine wellness examination for [name].  Exercise Activities and Dietary recommendations Current Exercise Habits: Structured exercise class, Time (Minutes): 45, Frequency (Times/Week): 3, Weekly Exercise (Minutes/Week): 135, Intensity: Moderate  Goals    . Exercise 150 min/wk Moderate Activity     Start playing golf and getting back to your routine        Fall Risk Fall Risk  11/03/2017 02/06/2017 06/17/2016 09/08/2015 09/08/2015  Falls in the past year? No No No No No     Depression Screen PHQ 2/9 Scores 02/06/2017 06/17/2016 09/08/2015 09/08/2015  PHQ - 2 Score 1 0 0 0   Declined screen as he reported his depression is under management and doing better. Wife in with him today.  Cognitive Function MMSE - Mini Mental State Exam 11/03/2017 06/17/2016   Not completed: (No Data) (No Data)    Ad8 score 0; no issues in decision making or other per the patient and his wife     Immunization History  Administered Date(s) Administered  . Influenza Split 12/10/2011, 07/09/2012  . Influenza Whole 08/11/2008, 07/31/2009  . Influenza, High Dose Seasonal PF 07/31/2013, 08/24/2014,  07/10/2015, 07/15/2016, 08/15/2017  . Pneumococcal Conjugate-13 08/29/2014  . Pneumococcal Polysaccharide-23 01/21/2005  . Td 04/10/2003, 10/14/2013  . Zoster 11/01/2011    Qualifies for Shingles Vaccine? Education provided   Screening Tests Health Maintenance  Topic Date Due  . TETANUS/TDAP  10/15/2023  . INFLUENZA VACCINE  Completed  . PNA vac Low Risk Adult  Completed       Plan:      PCP Notes   Health Maintenance Educated regarding the shingrix Dr. Collene Mares to complete one more colonoscopy in a couple of months   Abnormal Screens  Declined depression screen but stated his depression was currently being managed;   Referrals  No  Patient concerns; States he is doing well; plans to start playing golf again   Nurse Concerns; As noted  Next PCP apt Here today    I have personally reviewed and noted the following in the patient's chart:   . Medical and social history . Use of alcohol, tobacco or illicit drugs  . Current medications and supplements . Functional ability and status . Nutritional status . Physical activity . Advanced directives . List of other physicians . Hospitalizations, surgeries, and ER visits in previous 12 months . Vitals . Screenings to include cognitive, depression, and falls . Referrals and appointments  In addition, I have reviewed and discussed with patient certain preventive protocols, quality metrics, and best practice recommendations. A written personalized care plan for preventive services as well as general preventive health recommendations were provided to patient.     IRWER,XVQMG, RN  11/03/2017    Above reviewed and agree.  [name] MD [facility]

## 2017-11-15 ENCOUNTER — Other Ambulatory Visit: Payer: Self-pay | Admitting: Family Medicine

## 2017-12-04 ENCOUNTER — Encounter: Payer: Self-pay | Admitting: Family Medicine

## 2017-12-11 ENCOUNTER — Encounter (HOSPITAL_COMMUNITY): Payer: Self-pay | Admitting: Psychiatry

## 2017-12-11 ENCOUNTER — Ambulatory Visit (HOSPITAL_COMMUNITY): Payer: Medicare Other | Admitting: Psychiatry

## 2017-12-11 VITALS — BP 126/72 | HR 81 | Ht 67.0 in | Wt 138.4 lb

## 2017-12-11 DIAGNOSIS — F410 Panic disorder [episodic paroxysmal anxiety] without agoraphobia: Secondary | ICD-10-CM | POA: Diagnosis not present

## 2017-12-11 DIAGNOSIS — I10 Essential (primary) hypertension: Secondary | ICD-10-CM

## 2017-12-11 DIAGNOSIS — M2550 Pain in unspecified joint: Secondary | ICD-10-CM | POA: Diagnosis not present

## 2017-12-11 DIAGNOSIS — G8929 Other chronic pain: Secondary | ICD-10-CM

## 2017-12-11 DIAGNOSIS — F411 Generalized anxiety disorder: Secondary | ICD-10-CM | POA: Diagnosis not present

## 2017-12-11 DIAGNOSIS — M549 Dorsalgia, unspecified: Secondary | ICD-10-CM

## 2017-12-11 DIAGNOSIS — Z818 Family history of other mental and behavioral disorders: Secondary | ICD-10-CM | POA: Diagnosis not present

## 2017-12-11 DIAGNOSIS — Z87891 Personal history of nicotine dependence: Secondary | ICD-10-CM

## 2017-12-11 DIAGNOSIS — Z79899 Other long term (current) drug therapy: Secondary | ICD-10-CM

## 2017-12-11 MED ORDER — CLONAZEPAM 0.5 MG PO TABS: 0.5000 mg | ORAL_TABLET | Freq: Every day | ORAL | 0 refills | Status: DC | PRN

## 2017-12-11 NOTE — Progress Notes (Signed)
Psychiatric Initial Adult Assessment   Patient Identification: [name] MRN:  [id number] Date of Evaluation:  12/11/2017 Referral Source: Dr. Collene Mares  Chief Complaint:  I have anxiety and panic attacks.  Visit Diagnosis:    ICD-10-CM   1. GAD (generalized anxiety disorder) F41.1 clonazePAM (KLONOPIN) 0.5 MG tablet    History of Present Illness: Patient is 82 year old [place], retired, married man who is referred from her physician Dr. Collene Mares for seeking treatment for his anxiety symptoms.  Patient told he has a long history of anxiety and depression but started to get worse 2 years ago.  He cannot specify the triggers but in 2017 his son moved to [place] and he admitted unable to see grandkids.  He admitted his anxiety is so bad that he was unable to play golf, become very withdrawn, isolated, having panic attacks and excessive worry about his health.  He admitted fear of dying, extreme nervousness and any small thing makes him a big problem.  He admitted amplifying his obsessive thoughts but denies any compulsion or depression about contamination, checking or numbers.  He stopped socializing with his friends and worried about every small thing.  He admitted being on functional and very withdrawn.  His primary care physician switched from Celexa to Cymbalta but he started to have GI side effects.  Though he felt better with Cymbalta but start losing weight.  His primary care physician switched him to [place] however after taking for 6 weeks he noticed increased blood pressure and also later his anxiety started to get worse.  His primary care physician again switched back to Cymbalta 4 weeks ago.  He is doing much better.  He feels that Cymbalta working and he has no longer as severe GI side effects.  His appetite remains low but he has no nausea.  He was prescribed Klonopin in December and so far he has taken 10-15 tablets.  He admitted his panic attacks are less intense and less frequent.   He is trying to avoid taking benzodiazepine.  He feel Cymbalta 60 mg helping most of his symptoms and he wants to continue Cymbalta for now.  Denies any suicidal thoughts or homicidal thought.  He denies any paranoia, hallucination, mania, psychosis, PTSD symptoms.  He used to see Dr. Apolonio Schneiders last year but since taking the Cymbalta he feel he does not need counseling at this time.  He started socializing again.  2 weeks ago he went to see his son in [place] and he had a good time.  He lives with his wife who is very cooperative.  Patient is a retired from [employer] after working there for 40 years.  He has 2 sons.  One son who is a Teacher, music who lives in [place] and another son who was living in [place] but in 2017 moved to [place].  Patient denies any illegal substance use.  His energy level is fair.  He started playing golf again.  He is spending more time reading and writing poetry and recently published his book.  Patient has chronic back pain, hypertension and benign prostate hypertrophy.  He is seeing Dr. Darnell Level is a primary care physician.    Associated Signs/Symptoms: Depression Symptoms:  difficulty concentrating, anxiety, panic attacks, loss of energy/fatigue, (Hypo) Manic Symptoms:  No manic symptoms. Anxiety Symptoms:  Excessive Worry, Panic Symptoms, Psychotic Symptoms:  No psychotic symptoms. PTSD Symptoms: Negative  Past Psychiatric History: Patient reported history of depression and anxiety most of his life but started to get  worse in 2004/01/28.  In 27-Jan-2001 he retired and then his mother got sick in [place] and he spent 3 months there taking care of her.  Same year his mother-in-law got very sick in [place] taking care of her who eventually died in 01/28/2003.  In 2004/01/28 patient developed severe anxiety and panic attacks.  He became very isolated, withdrawn and nonfunctional.  His primary care physician started him on Lexapro which she took for a few years but switched to Celexa due to cost.   It worked very well until Jan 28, 2016 his depression and anxiety relapse.  His primary care physician tried him on Celexa up to 60 mg but symptoms do not improve.  He tried Pristiq and Cymbalta.  Pristiq did not help and Cymbalta help with some GI side effects.  He was also prescribed Klonopin in July 2018 for panic attack which work to help his panic attacks.  Patient denies any history of psychiatric inpatient treatment, suicidal attempt, mania, psychosis or any self abusive behavior.  He was seeing Dr. Apolonio Schneiders last year for severe anxiety which she stopped after feeling better.  Previous Psychotropic Medications: Yes   Substance Abuse History in the last 12 months:  No.  Consequences of Substance Abuse: Negative  Past Medical History:  Past Medical History:  Diagnosis Date  . Anxiety   . BPH (benign prostatic hypertrophy)   . GERD (gastroesophageal reflux disease)   . Sleep apnea     Past Surgical History:  Procedure Laterality Date  . NASAL SEPTOPLASTY W/ TURBINOPLASTY  08/03/2012   Procedure: NASAL SEPTOPLASTY WITH TURBINATE REDUCTION;  Surgeon: Izora Gala, MD;  Location: [facility] OR;  Service: ENT;  Laterality: Bilateral;    Family Psychiatric History: Sister has anxiety and depression.  Family History:  Family History  Problem Relation Age of Onset  . Heart attack Father   . Hypertension Father   . Hypertension Daughter     Social History:   Social History   Socioeconomic History  . Marital status: Married    Spouse name: Not on file  . Number of children: Not on file  . Years of education: Not on file  . Highest education level: Not on file  Social Needs  . Financial resource strain: Not on file  . Food insecurity - worry: Not on file  . Food insecurity - inability: Not on file  . Transportation needs - medical: Not on file  . Transportation needs - non-medical: Not on file  Occupational History  . Not on file  Tobacco Use  . Smoking status: Former Research scientist (life sciences)  .  Smokeless tobacco: Never Used  . Tobacco comment: rare use over 50 years   Substance and Sexual Activity  . Alcohol use: Yes    Alcohol/week: 0.6 oz    Types: 1 Shots of liquor per week    Comment: socially  . Drug use: No  . Sexual activity: No    Birth control/protection: None  Other Topics Concern  . Not on file  Social History Narrative   Lives in 2 level    Will stay for now    Just moved here approx 1.5 yo   Very nice neighbors    [employer]  x 5 with a very busy holiday   One son is a Teacher, music and nephew is a Investment banker, operational    Son in [place] moved to [place] and he misses his grand children    As 2 brothers in [place]  Additional Social History: Patient born in [place] and lived there until age 66 and then moved to [place].  Patient moved to [place] in 1966.  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  Patient has 2 son, one live in [place] who is a Teacher, music and another one living in [place].  Patient lives with his wife who is very supportive.  Patient has a good social network, he plays golf and write and read poetry.  Allergies:  No Known Allergies  Metabolic Disorder Labs: Recent Results (from the past 2160 hour(s))  POCT Urinalysis Dipstick (Automated)     Status: Abnormal   Collection Time: 09/13/17  9:39 AM  Result Value Ref Range   Color, UA yellow    Clarity, UA cloudy    Glucose, UA neg    Bilirubin, UA neg    Ketones, UA neg    Spec Grav, UA 1.020 1.010 - 1.025   Blood, UA 1    pH, UA 6.5 5.0 - 8.0   Protein, UA neg    Urobilinogen, UA 0.2 0.2 or 1.0 E.U./dL   Nitrite, UA pos    Leukocytes, UA Negative Negative  Urine Culture     Status: None   Collection Time: 09/13/17  9:45 AM  Result Value Ref Range   MICRO NUMBER: [id number]    SPECIMEN QUALITY: ADEQUATE    Sample Source URINE    STATUS: FINAL    ISOLATE 1:      Three or more organisms present, each greater than 10,000 cu/mL. May represent normal  flora contamination from external genitalia. No further testing is required.  Comprehensive metabolic panel     Status: Abnormal   Collection Time: 10/13/17  4:23 PM  Result Value Ref Range   Glucose, Bld 109 (H) 65 - 99 mg/dL    Comment: .            Fasting reference interval . For someone without known diabetes, a glucose value between 100 and 125 mg/dL is consistent with prediabetes and should be confirmed with a follow-up test. .    BUN 21 7 - 25 mg/dL   Creat 0.89 0.70 - 1.18 mg/dL    Comment: For patients >35 years of age, the reference limit for Creatinine is approximately 13% higher for people identified as African-American. .    BUN/Creatinine Ratio NOT APPLICABLE 6 - 22 (calc)   Sodium 138 135 - 146 mmol/L   Potassium 4.9 3.5 - 5.3 mmol/L   Chloride 103 98 - 110 mmol/L   CO2 25 20 - 32 mmol/L   Calcium 8.7 8.6 - 10.3 mg/dL   Total Protein 6.3 6.1 - 8.1 g/dL   Albumin 3.8 3.6 - 5.1 g/dL   Globulin 2.5 1.9 - 3.7 g/dL (calc)   AG Ratio 1.5 1.0 - 2.5 (calc)   Total Bilirubin 0.3 0.2 - 1.2 mg/dL   Alkaline phosphatase (APISO) 62 40 - 115 U/L   AST 13 10 - 35 U/L   ALT 15 9 - 46 U/L  CBC with Differential/Platelet     Status: Abnormal   Collection Time: 10/13/17  4:23 PM  Result Value Ref Range   WBC 6.9 3.8 - 10.8 Thousand/uL   RBC 4.53 4.20 - 5.80 Million/uL   Hemoglobin 11.6 (L) 13.2 - 17.1 g/dL   HCT 36.2 (L) 38.5 - 50.0 %   MCV 79.9 (L) 80.0 - 100.0 fL   MCH 25.6 (L) 27.0 -  33.0 pg   MCHC 32.0 32.0 - 36.0 g/dL   RDW 13.0 11.0 - 15.0 %   Platelets 250 140 - 400 Thousand/uL   MPV 11.5 7.5 - 12.5 fL   Neutro Abs 3,857 1,500 - 7,800 cells/uL   Lymphs Abs 2,063 850 - 3,900 cells/uL   WBC mixed population 800 200 - 950 cells/uL   Eosinophils Absolute 138 15 - 500 cells/uL   Basophils Absolute 41 0 - 200 cells/uL   Neutrophils Relative % 55.9 %   Total Lymphocyte 29.9 %   Monocytes Relative 11.6 %   Eosinophils Relative 2.0 %   Basophils Relative 0.6 %   Vitamin B12     Status: Abnormal   Collection Time: 10/13/17  4:23 PM  Result Value Ref Range   Vitamin B-12 1,900 (H) 200 - 1,100 pg/mL  Iron, TIBC and Ferritin Panel     Status: Abnormal   Collection Time: 10/17/17 12:34 PM  Result Value Ref Range   Iron 64 50 - 180 mcg/dL   TIBC 392 250 - 425 mcg/dL (calc)   %SAT 16 15 - 60 % (calc)   Ferritin 14 (L) 20 - 380 ng/mL  Ferritin     Status: Abnormal   Collection Time: 10/17/17 12:34 PM  Result Value Ref Range   Ferritin 10.5 (L) 22.0 - 322.0 ng/mL  POC Hemoccult Bld/Stl (3-Cd Home Screen)     Status: None   Collection Time: 10/23/17 10:40 AM  Result Value Ref Range   Card #1 Date 10/18/2017    Fecal Occult Blood, POC Negative Negative   Card #2 Date 10/19/2017    Card #2 Fecal Occult Blod, POC Negative    Card #3 Date 10/20/2017    Card #3 Fecal Occult Blood, POC Negative    No results found for: HGBA1C, MPG No results found for: PROLACTIN Lab Results  Component Value Date   CHOL 182 07/08/2016   TRIG 53.0 07/08/2016   HDL 66.70 07/08/2016   CHOLHDL 3 07/08/2016   VLDL 10.6 07/08/2016   LDLCALC 104 (H) 07/08/2016   LDLCALC 113 (H) 10/17/2013     Current Medications: Current Outpatient Medications  Medication Sig Dispense Refill  . amLODipine (NORVASC) 5 MG tablet TAKE 1 TABLET BY MOUTH DAILY 90 tablet 1  . aspirin 81 MG chewable tablet Chew 81 mg by mouth daily.    . clonazePAM (KLONOPIN) 0.5 MG tablet Take 1 tablet (0.5 mg total) by mouth 2 (two) times daily as needed for anxiety. 30 tablet 0  . DULoxetine (CYMBALTA) 60 MG capsule Take 1 capsule (60 mg total) by mouth daily. 90 capsule 3  . hydrocortisone 2.5 % cream Apply topically 2 (two) times daily as needed. 30 g 1  . omeprazole (PRILOSEC) 40 MG capsule TAKE 1 CAPSULE BY MOUTH  DAILY 90 capsule 2  . RA VITAMIN B12 2000 MCG TBCR take 1 tablet by mouth daily 30 tablet 5  . terazosin (HYTRIN) 5 MG capsule TAKE 1 CAPSULE BY MOUTH  DAILY 90 capsule 1   No current  facility-administered medications for this visit.     Neurologic: Headache: No Seizure: No Paresthesias:No  Musculoskeletal: Strength & Muscle Tone: within normal limits Gait & Station: normal Patient leans: N/A  Psychiatric Specialty Exam: Review of Systems  Constitutional: Negative.   HENT: Negative.   Gastrointestinal: Positive for nausea.  Musculoskeletal: Positive for back pain and joint pain.  Skin: Negative.   Neurological: Negative.     Blood pressure 126/72, pulse  81, height 5\' 7"  (1.702 m), weight 138 lb 6.4 oz (62.8 kg).Body mass index is 21.68 kg/m.  General Appearance: Casual  Eye Contact:  Good  Speech:  Clear and Coherent and Slow  Volume:  Normal  Mood:  Anxious  Affect:  Congruent  Thought Process:  Goal Directed  Orientation:  Full (Time, Place, and Person)  Thought Content:  Logical  Suicidal Thoughts:  No  Homicidal Thoughts:  No  Memory:  Immediate;   Good Recent;   Good Remote;   Good  Judgement:  Good  Insight:  Good  Psychomotor Activity:  Normal  Concentration:  Concentration: Good and Attention Span: Good  Recall:  Good  Fund of Knowledge:Good  Language: Good  Akathisia:  No  Handed:  Right  AIMS (if indicated):  0  Assets:  Communication Skills Desire for [place]m Talents/Skills Transportation  ADL's:  Intact  Cognition: WNL  Sleep:  good   Assessment: Generalized anxiety disorder.  Panic attacks.  Plan: I reviewed patient's symptoms, history, current medication, psychosocial stressors and recent blood work results.  Patient doing better on Cymbalta 60 mg.  He has some nausea but overall he is tolerating very well his medication and believe his symptoms are much better on Cymbalta.  He was prescribed Klonopin in December however he has taken 15 tablets so far.  Is trying to avoid benzodiazepine due to tolerance and withdrawal.  However he like to have Klonopin in case he had a panic attack.   He is going with his wife in 2 weeks to [place] and he does not want to have panic attacks there.  At this time he not feel he need to see Dr. Cheryln Manly however agreed that if symptoms started to get worse then he will resume counseling.  We discussed in length medication side effects and benefits.  Discussed relaxing and breathing technique.  Encouraged to continue his poetry which he enjoys a lot.  Encouraged to continue play golf and he spent time with the friends.  Discussed safety concerns at any time having active suicidal thoughts or homicidal thought that he need to call 911 or go to local emergency room.  Patient has enough Cymbalta for 2 months.  I will see him again in 6 weeks and he will get his Cymbalta prescription on that visit.  We discussed in length about medication side effect especially benzodiazepine dependence tolerance abuse and withdrawal.  Follow-up in 6 weeks.  Kathlee Nations, MD  2/11/201911:23 AM

## 2018-01-20 ENCOUNTER — Other Ambulatory Visit (HOSPITAL_COMMUNITY): Payer: Self-pay | Admitting: Psychiatry

## 2018-01-20 ENCOUNTER — Ambulatory Visit (INDEPENDENT_AMBULATORY_CARE_PROVIDER_SITE_OTHER): Payer: Medicare Other | Admitting: Psychiatry

## 2018-01-20 ENCOUNTER — Encounter (HOSPITAL_COMMUNITY): Payer: Self-pay | Admitting: Psychiatry

## 2018-01-20 VITALS — BP 126/78 | HR 79 | Ht 67.0 in | Wt 136.0 lb

## 2018-01-20 DIAGNOSIS — F411 Generalized anxiety disorder: Secondary | ICD-10-CM

## 2018-01-20 DIAGNOSIS — F410 Panic disorder [episodic paroxysmal anxiety] without agoraphobia: Secondary | ICD-10-CM

## 2018-01-20 DIAGNOSIS — F419 Anxiety disorder, unspecified: Secondary | ICD-10-CM

## 2018-01-20 MED ORDER — ESCITALOPRAM OXALATE 10 MG PO TABS: ORAL_TABLET | ORAL | 0 refills | Status: DC

## 2018-01-20 MED ORDER — LORAZEPAM 0.5 MG PO TABS: 0.5000 mg | ORAL_TABLET | Freq: Every day | ORAL | 0 refills | Status: DC | PRN

## 2018-01-20 MED ORDER — DULOXETINE HCL 30 MG PO CPEP: ORAL_CAPSULE | ORAL | 0 refills | Status: DC

## 2018-01-20 NOTE — Progress Notes (Signed)
[facility] MD/PA/NP OP Progress Note  01/20/2018 1:48 PM [name]  MRN:  [id number]  Chief Complaint:  I was feeling good but now I am feeling very anxious and sad.   HPI: Patient is a 82 year old [place] retired married man who was seen first time 4 weeks ago.  Patient was referred from his GI physician as patient was experiencing increased anxiety and feeling isolated, withdrawn, having panic attacks and excessive worries about his health.  He was worried about dying, he was feeling very nervous and anxious we decreased socialization.  His physician is started him on Cymbalta and at that time he does not want to change the medication because he felt it is working.  Patient told he did well for a few weeks and had a good trip to [place] and then [place] but he started to feel again very anxious, withdrawn, isolated with decreased energy and social isolation.  Today he brought his wife also endorsed that patient has been not active and stopped going to social circles and his friends.  He stopped writing poetry, stopped going to play golf and has been not involved in cultural events.  His wife concerned that patient stays to himself a lot.  He had a lot of negative and ruminative thoughts but denies any suicidal thoughts or any paranoia.  Patient denies drinking alcohol or using any illegal substances.  He tried a few times Klonopin but it make him more tired, fatigue and sleepy.  He has fewer panic attacks.  He is sleeping okay.  He is not interested in counseling.  Patient denies any agitation, anger or any mania.    Visit Diagnosis:    ICD-10-CM   1. Anxiety F41.9 LORazepam (ATIVAN) 0.5 MG tablet  2. GAD (generalized anxiety disorder) F41.1 escitalopram (LEXAPRO) 10 MG tablet    Past Psychiatric History:  Patient had a history of depression and anxiety most of his life.  He had a good response with Lexapro which he took for many years but it was changed to Celexa due to cost.  He tried  Celexa, Pristiq and recently Cymbalta but limited outcome.  Patient denies any history of suicidal attempt, mania, psychosis or any self abusive behavior.  He was seeing Dr. Cheryln Manly for counseling however it stopped last year.  Primary care physician given him Klonopin for panic attacks but it make him very sleepy and groggy.  Past Medical History:  Past Medical History:  Diagnosis Date  . Anxiety   . BPH (benign prostatic hypertrophy)   . GERD (gastroesophageal reflux disease)   . Sleep apnea     Past Surgical History:  Procedure Laterality Date  . NASAL SEPTOPLASTY W/ TURBINOPLASTY  08/03/2012   Procedure: NASAL SEPTOPLASTY WITH TURBINATE REDUCTION;  Surgeon: Izora Gala, MD;  Location: [facility] OR;  Service: ENT;  Laterality: Bilateral;    Family Psychiatric History: Reviewed.  Family History:  Family History  Problem Relation Age of Onset  . Heart attack Father   . Hypertension Father   . Hypertension Daughter     Social History:  Social History   Socioeconomic History  . Marital status: Married    Spouse name: Not on file  . Number of children: Not on file  . Years of education: Not on file  . Highest education level: Not on file  Occupational History  . Not on file  Social Needs  . Financial resource strain: Not on file  . Food insecurity:    Worry: Not on  file    Inability: Not on file  . Transportation needs:    Medical: Not on file    Non-medical: Not on file  Tobacco Use  . Smoking status: Former Research scientist (life sciences)  . Smokeless tobacco: Never Used  . Tobacco comment: rare use over 50 years   Substance and Sexual Activity  . Alcohol use: Yes    Alcohol/week: 0.6 oz    Types: 1 Shots of liquor per week    Comment: socially  . Drug use: No  . Sexual activity: Never    Birth control/protection: None  Lifestyle  . Physical activity:    Days per week: Not on file    Minutes per session: Not on file  . Stress: Not on file  Relationships  . Social connections:     Talks on phone: Not on file    Gets together: Not on file    Attends religious service: Not on file    Active member of club or organization: Not on file    Attends meetings of clubs or organizations: Not on file    Relationship status: Not on file  Other Topics Concern  . Not on file  Social History Narrative   Lives in 2 level    Will stay for now    Just moved here approx 1.5 yo   Very nice neighbors    [employer]  x 5 with a very busy holiday   One son is a Teacher, music and nephew is a Investment banker, operational    Son in [place] moved to [place] and he misses his grand children    As 2 brothers in [place]         Allergies: No Known Allergies  Metabolic Disorder Labs: No results found for: HGBA1C, MPG No results found for: PROLACTIN Lab Results  Component Value Date   CHOL 182 07/08/2016   TRIG 53.0 07/08/2016   HDL 66.70 07/08/2016   CHOLHDL 3 07/08/2016   VLDL 10.6 07/08/2016   LDLCALC 104 (H) 07/08/2016   LDLCALC 113 (H) 10/17/2013   Lab Results  Component Value Date   TSH 1.72 08/15/2017   TSH 2.96 07/08/2016    Therapeutic Level Labs: No results found for: LITHIUM No results found for: VALPROATE No components found for:  CBMZ  Current Medications: Current Outpatient Medications  Medication Sig Dispense Refill  . amLODipine (NORVASC) 5 MG tablet TAKE 1 TABLET BY MOUTH DAILY 90 tablet 1  . aspirin 81 MG chewable tablet Chew 81 mg by mouth daily.    . clonazePAM (KLONOPIN) 0.5 MG tablet Take 1 tablet (0.5 mg total) by mouth daily as needed for anxiety. 20 tablet 0  . DULoxetine (CYMBALTA) 60 MG capsule Take 1 capsule (60 mg total) by mouth daily. 90 capsule 3  . hydrocortisone 2.5 % cream Apply topically 2 (two) times daily as needed. 30 g 1  . omeprazole (PRILOSEC) 40 MG capsule TAKE 1 CAPSULE BY MOUTH  DAILY 90 capsule 2  . RA VITAMIN B12 2000 MCG TBCR take 1 tablet by mouth daily 30 tablet 5  . terazosin (HYTRIN) 5 MG capsule TAKE 1 CAPSULE BY MOUTH  DAILY 90 capsule 1    No current facility-administered medications for this visit.      Musculoskeletal: Strength & Muscle Tone: within normal limits Gait & Station: normal Patient leans: N/A  Psychiatric Specialty Exam: Review of Systems  Constitutional: Negative.   HENT: Negative.   Respiratory: Negative.   Cardiovascular: Negative.   Genitourinary: Negative.  Musculoskeletal: Negative.   Skin: Negative.   Neurological: Negative.   Psychiatric/Behavioral: Positive for depression. The patient is nervous/anxious.     Blood pressure 126/78, pulse 79, height 5\' 7"  (1.702 m), weight 136 lb (61.7 kg).There is no height or weight on file to calculate BMI.  General Appearance: Casual  Eye Contact:  Fair  Speech:  Slow  Volume:  Decreased  Mood:  Anxious and Depressed  Affect:  Constricted  Thought Process:  Goal Directed  Orientation:  Full (Time, Place, and Person)  Thought Content: Rumination   Suicidal Thoughts:  No  Homicidal Thoughts:  No  Memory:  Immediate;   Good Recent;   Good Remote;   Good  Judgement:  Good  Insight:  Good  Psychomotor Activity:  Normal  Concentration:  Concentration: Fair and Attention Span: Fair  Recall:  Good  Fund of Knowledge: Good  Language: Good  Akathisia:  No  Handed:  Right  AIMS (if indicated): not done  Assets:  Communication Skills Desire for Improvement Housing Resilience Social Support  ADL's:  Intact  Cognition: WNL  Sleep:  Good   Screenings: PHQ2-9     Office Visit from 02/06/2017 in [facility] at [facility] from 06/17/2016 in [place] at [facility] from 09/08/2015 in [place] at [facility] from 08/29/2014 in [place] at [facility] from 10/14/2013 in [place] at [facility] Total Score  1  0  0  0  0       Assessment and Plan: Generalized anxiety disorder.  Panic attacks.  Patient is taking Cymbalta 60 mg but his anxiety and  depression is not getting better.  Though he is very reluctant to change the medication but after some discussion willing to try Lexapro which had helped him in the past.  I would also discontinue Klonopin because it is making him sleepy and groggy.  Recommended to try low-dose lorazepam for severe panic attacks.  Patient is not interested in counseling.  I encourage to start his social activities.  Encouraged to play golf and write poetry which he used to enjoy.  We discussed cross taper of Cymbalta.  He will take 30 mg Cymbalta for 2 weeks and then stop.  We will start Lexapro 10 mg for 2 weeks and then 20 mg daily.  Discussed medication side effects and benefits especially in the beginning can cause GI side effects and sedation.  Strongly encouraged to call us back if he has any question, concern if he feels worsening of the symptoms.  Follow-up in 4 weeks. Time spent 25 minutes.  More than 50% of the time spent in psychoeducation, counseling and coordination of care.  Discuss safety plan that anytime having active suicidal thoughts or homicidal thoughts then patient need to call 911 or go to the local emergency room.   Kathlee Nations, MD 01/20/2018, 1:48 PM

## 2018-01-22 ENCOUNTER — Other Ambulatory Visit (HOSPITAL_COMMUNITY): Payer: Self-pay | Admitting: Psychiatry

## 2018-01-22 NOTE — Telephone Encounter (Signed)
Cymbalta will be stopped after few weeks.  No need for 90 days. We just started Lexapro to see the response.  Too soon to call in for 90-day supply.

## 2018-01-24 ENCOUNTER — Ambulatory Visit (HOSPITAL_COMMUNITY): Payer: Self-pay | Admitting: Psychiatry

## 2018-01-30 ENCOUNTER — Other Ambulatory Visit: Payer: Self-pay | Admitting: Family Medicine

## 2018-02-19 ENCOUNTER — Other Ambulatory Visit (HOSPITAL_COMMUNITY): Payer: Self-pay | Admitting: Psychiatry

## 2018-02-19 ENCOUNTER — Telehealth (HOSPITAL_COMMUNITY): Payer: Self-pay

## 2018-02-19 DIAGNOSIS — F411 Generalized anxiety disorder: Secondary | ICD-10-CM

## 2018-02-19 MED ORDER — ESCITALOPRAM OXALATE 20 MG PO TABS: 20.0000 mg | ORAL_TABLET | Freq: Every day | ORAL | 1 refills | Status: DC

## 2018-02-19 NOTE — Telephone Encounter (Signed)
Medication refill - Telephone cal with patient to inform his new Lexapro 20 mg, one a day order, #30 with 1 refill had been sent into his [facility] today by Dr. Adele Schilder. Pt. to call back if any problems.

## 2018-02-19 NOTE — Telephone Encounter (Signed)
Medication management - Telephone call with patient to follow up on his requested new order for Lexapro 20 mg, one a day as states he has gone up to the 20 mg now.  Requests this be sent into [facility] on [address] and returns 03/03/18.  Agreed to send request to Dr. Adele Schilder to send in a new order at the appropriate dosage he is now taking.

## 2018-02-19 NOTE — Telephone Encounter (Signed)
Lexapro 20 mg called to [facility]

## 2018-03-03 ENCOUNTER — Ambulatory Visit (INDEPENDENT_AMBULATORY_CARE_PROVIDER_SITE_OTHER): Payer: Medicare Other | Admitting: Psychiatry

## 2018-03-03 ENCOUNTER — Other Ambulatory Visit: Payer: Self-pay

## 2018-03-03 ENCOUNTER — Encounter (HOSPITAL_COMMUNITY): Payer: Self-pay | Admitting: Psychiatry

## 2018-03-03 ENCOUNTER — Ambulatory Visit (HOSPITAL_COMMUNITY): Payer: Medicare Other | Admitting: Psychiatry

## 2018-03-03 DIAGNOSIS — F410 Panic disorder [episodic paroxysmal anxiety] without agoraphobia: Secondary | ICD-10-CM | POA: Diagnosis not present

## 2018-03-03 DIAGNOSIS — Z87891 Personal history of nicotine dependence: Secondary | ICD-10-CM

## 2018-03-03 DIAGNOSIS — F411 Generalized anxiety disorder: Secondary | ICD-10-CM

## 2018-03-03 DIAGNOSIS — F419 Anxiety disorder, unspecified: Secondary | ICD-10-CM

## 2018-03-03 MED ORDER — ESCITALOPRAM OXALATE 20 MG PO TABS: 30.0000 mg | ORAL_TABLET | Freq: Every day | ORAL | 1 refills | Status: DC

## 2018-03-03 MED ORDER — LORAZEPAM 0.5 MG PO TABS: 0.5000 mg | ORAL_TABLET | Freq: Every day | ORAL | 0 refills | Status: DC | PRN

## 2018-03-03 NOTE — Progress Notes (Signed)
[place] MD/PA/NP OP Progress Note  03/03/2018 9:34 AM [name]  MRN:  [id number]  Chief Complaint: I am taking medication but I still have anxiousness and anxiety.  HPI: Patient came for her follow-up appointment.  On his last visit we stopped the Cymbalta and now he is taking Lexapro 20 mg.  I also discontinue Klonopin because it was making him sleepy and groggy and recommended to try lorazepam.  He likes lorazepam better than Klonopin.  Patient is still have anxiety and nervousness and there are days when he does not feel good about himself.  He states to his bed with lack of energy and motivation to do things.  Recently he tried to play golf but did not enjoy.  He admitted not able to write poetry which she always enjoyed.  Patient has a lot of negative and ruminative thoughts but he denies any suicidal thoughts or any hallucination.  His energy level is fair.  He is tolerating Lexapro without any side effects.  He sleeps good but sometimes he wakes up early and that he cannot go back to sleep.  Patient is not interested in counseling.  He denies any crying spells or any feeling of hopelessness or worthlessness.  He lives with his wife who is very supportive.  Patient is hoping to visit [place] to see his grandkids end of this month.  Visit Diagnosis:    ICD-10-CM   1. GAD (generalized anxiety disorder) F41.1 escitalopram (LEXAPRO) 20 MG tablet  2. Anxiety F41.9 LORazepam (ATIVAN) 0.5 MG tablet    Past Psychiatric History: Reviewed. Patient had a history of depression and anxiety most of his life.  He had a good response with Lexapro which he took for many years but it was changed to Celexa due to cost.  He tried Celexa, Pristiq and recently Cymbalta but limited outcome.  Patient denies any history of suicidal attempt, mania, psychosis or any self abusive behavior.  He saw Dr. Cheryln Manly in the past for therapy. Primary care physician given him Klonopin for panic attacks but it make him very sleepy  and groggy.  Past Medical History:  Past Medical History:  Diagnosis Date  . Anxiety   . BPH (benign prostatic hypertrophy)   . GERD (gastroesophageal reflux disease)   . Sleep apnea     Past Surgical History:  Procedure Laterality Date  . NASAL SEPTOPLASTY W/ TURBINOPLASTY  08/03/2012   Procedure: NASAL SEPTOPLASTY WITH TURBINATE REDUCTION;  Surgeon: Izora Gala, MD;  Location: [facility] OR;  Service: ENT;  Laterality: Bilateral;    Family Psychiatric History: Reviewed  Family History:  Family History  Problem Relation Age of Onset  . Heart attack Father   . Hypertension Father   . Hypertension Daughter     Social History:  Social History   Socioeconomic History  . Marital status: Married    Spouse name: Not on file  . Number of children: Not on file  . Years of education: Not on file  . Highest education level: Not on file  Occupational History  . Not on file  Social Needs  . Financial resource strain: Not on file  . Food insecurity:    Worry: Not on file    Inability: Not on file  . Transportation needs:    Medical: Not on file    Non-medical: Not on file  Tobacco Use  . Smoking status: Former Research scientist (life sciences)  . Smokeless tobacco: Never Used  . Tobacco comment: rare use over 50 years  Substance and Sexual Activity  . Alcohol use: Yes    Alcohol/week: 0.6 oz    Types: 1 Shots of liquor per week    Comment: socially  . Drug use: No  . Sexual activity: Never    Birth control/protection: None  Lifestyle  . Physical activity:    Days per week: Not on file    Minutes per session: Not on file  . Stress: Not on file  Relationships  . Social connections:    Talks on phone: Not on file    Gets together: Not on file    Attends religious service: Not on file    Active member of club or organization: Not on file    Attends meetings of clubs or organizations: Not on file    Relationship status: Not on file  Other Topics Concern  . Not on file  Social History Narrative    Lives in 2 level    Will stay for now    Just moved here approx 1.5 yo   Very nice neighbors    [employer]  x 5 with a very busy holiday   One son is a Teacher, music and nephew is a Investment banker, operational    Son in [place] moved to [place] and he misses his grand children    As 2 brothers in [place]         Allergies: No Known Allergies  Metabolic Disorder Labs: No results found for: HGBA1C, MPG No results found for: PROLACTIN Lab Results  Component Value Date   CHOL 182 07/08/2016   TRIG 53.0 07/08/2016   HDL 66.70 07/08/2016   CHOLHDL 3 07/08/2016   VLDL 10.6 07/08/2016   LDLCALC 104 (H) 07/08/2016   LDLCALC 113 (H) 10/17/2013   Lab Results  Component Value Date   TSH 1.72 08/15/2017   TSH 2.96 07/08/2016    Therapeutic Level Labs: No results found for: LITHIUM No results found for: VALPROATE No components found for:  CBMZ  Current Medications: Current Outpatient Medications  Medication Sig Dispense Refill  . amLODipine (NORVASC) 5 MG tablet TAKE 1 TABLET BY MOUTH DAILY 90 tablet 1  . aspirin 81 MG chewable tablet Chew 81 mg by mouth daily.    . DULoxetine (CYMBALTA) 30 MG capsule Take one capsule daily for 2 weeks and than stop 15 capsule 0  . escitalopram (LEXAPRO) 20 MG tablet Take 1 tablet (20 mg total) by mouth daily. 30 tablet 1  . hydrocortisone 2.5 % cream Apply topically 2 (two) times daily as needed. 30 g 1  . LORazepam (ATIVAN) 0.5 MG tablet Take 1 tablet (0.5 mg total) by mouth daily as needed for anxiety. 15 tablet 0  . omeprazole (PRILOSEC) 40 MG capsule TAKE 1 CAPSULE BY MOUTH  DAILY 90 capsule 2  . RA VITAMIN B12 2000 MCG TBCR take 1 tablet by mouth daily 30 tablet 5  . terazosin (HYTRIN) 5 MG capsule TAKE 1 CAPSULE BY MOUTH  DAILY 90 capsule 1  . terazosin (HYTRIN) 5 MG capsule TAKE 1 CAPSULE BY MOUTH  DAILY 90 capsule 1   No current facility-administered medications for this visit.      Musculoskeletal: Strength & Muscle Tone: within normal limits Gait &  Station: normal Patient leans: N/A  Psychiatric Specialty Exam: ROS  Blood pressure (!) 153/76, pulse 73, temperature 97.6 F (36.4 C), temperature source Oral, weight 136 lb 3.2 oz (61.8 kg).There is no height or weight on file to calculate BMI.  General Appearance: Casual  Eye Contact:  Fair  Speech:  Slow  Volume:  Decreased  Mood:  Anxious  Affect:  Congruent  Thought Process:  Goal Directed  Orientation:  Full (Time, Place, and Person)  Thought Content: Rumination   Suicidal Thoughts:  No  Homicidal Thoughts:  No  Memory:  Immediate;   Good Recent;   Good Remote;   Good  Judgement:  Good  Insight:  Good  Psychomotor Activity:  Normal  Concentration:  Concentration: Fair and Attention Span: Fair  Recall:  Good  Fund of Knowledge: Good  Language: Good  Akathisia:  No  Handed:  Right  AIMS (if indicated): not done  Assets:  Communication Skills Desire for Improvement Housing Resilience Social Support Transportation  ADL's:  Intact  Cognition: WNL  Sleep:  Good   Screenings: PHQ2-9     Office Visit from 02/06/2017 in [place] at [place] from 06/17/2016 in [place] at [facility] from 09/08/2015 in [place] at [facility] from 08/29/2014 in [place] at [facility] from 10/14/2013 in [place] at [facility] Total Score  1  0  0  0  0       Assessment and Plan: Generalized anxiety disorder.  Panic attacks.  Patient taking Lexapro and Ativan but continued to have anxiety and nervousness.  He is not interested in counseling.  I recommended to try Lexapro 30 mg daily.  Continue lorazepam 0.5 mg for severe anxiety and nervousness.  Encourage to increase social activities and write poetry which he always enjoyed.  Discussed medication side effects and benefits.  If patient continued to remain anxious then we will consider trying Effexor.  Follow-up in 2  months.   Kathlee Nations, MD 03/03/2018, 9:34 AM

## 2018-04-24 ENCOUNTER — Ambulatory Visit: Payer: Medicare Other | Admitting: Family Medicine

## 2018-04-24 ENCOUNTER — Encounter: Payer: Self-pay | Admitting: Family Medicine

## 2018-04-24 VITALS — BP 112/70 | HR 73 | Temp 98.1°F | Wt 133.9 lb

## 2018-04-24 DIAGNOSIS — R05 Cough: Secondary | ICD-10-CM | POA: Diagnosis not present

## 2018-04-24 DIAGNOSIS — R059 Cough, unspecified: Secondary | ICD-10-CM

## 2018-04-24 DIAGNOSIS — M1812 Unilateral primary osteoarthritis of first carpometacarpal joint, left hand: Secondary | ICD-10-CM

## 2018-04-24 MED ORDER — AZITHROMYCIN 250 MG PO TABS: ORAL_TABLET | ORAL | 0 refills | Status: AC

## 2018-04-24 NOTE — Patient Instructions (Signed)
Follow up for any fever or increased shortness of breath. 

## 2018-04-24 NOTE — Progress Notes (Signed)
  Subjective:     Patient ID: [name], male   DOB: [birth year], 82 y.o.   MRN: [id number]  HPI Patient seen for the following acute issues  Five-day history of cough. Just returned recently from [place]. He has a brother dying of brain cancer there. He might have to return any day now. Cough mostly nonproductive. No nasal congestion. No fever. No dyspnea. No hemoptysis. Appetite and weight stable. Nonsmoker.  Second issue is left hand pain. Pain is specifically CMC joint. No injury. Right-hand dominant. Pain is relatively mild. Achy quality. No numbness. No weakness. No overlying skin changes. He's had some mild achiness in the lower extremities but no generalized arthralgias.  Past Medical History:  Diagnosis Date  . Anxiety   . BPH (benign prostatic hypertrophy)   . GERD (gastroesophageal reflux disease)   . Sleep apnea    Past Surgical History:  Procedure Laterality Date  . NASAL SEPTOPLASTY W/ TURBINOPLASTY  08/03/2012   Procedure: NASAL SEPTOPLASTY WITH TURBINATE REDUCTION;  Surgeon: Izora Gala, MD;  Location: [facility];  Service: ENT;  Laterality: Bilateral;    reports that he has quit smoking. He has never used smokeless tobacco. He reports that he drinks about 0.6 oz of alcohol per week. He reports that he does not use drugs. family history includes Heart attack in his father; Hypertension in his daughter and father. No Known Allergies   Review of Systems  Constitutional: Negative for appetite change, chills and fever.  HENT: Negative for congestion.   Respiratory: Positive for cough. Negative for shortness of breath and wheezing.   Cardiovascular: Negative for chest pain, palpitations and leg swelling.  Musculoskeletal: Positive for arthralgias.       Objective:   Physical Exam  Constitutional: He appears well-developed and well-nourished.  HENT:  Mouth/Throat: Oropharynx is clear and moist.  Neck: Neck supple. No thyromegaly present.  Cardiovascular: Normal rate and  regular rhythm.  Pulmonary/Chest: Effort normal and breath sounds normal. No respiratory distress. He has no wheezes. He has no rales.  Musculoskeletal:  Left hand examined. He has some mild tenderness CMC joint. No erythema. No warmth. No ecchymosis. Full grip strength bilaterally.  Lymphadenopathy:    He has no cervical adenopathy.       Assessment:     #1 cough. Patient no respiratory distress. Suspect acute bronchitis. Patient is very concerned because of potential upcoming travel to [place]  #2 left hand pain involving [place] joint. Suspect primary osteoarthritis.    Plan:     -Reassurance regarding hand pain. He will take Tylenol as needed. -We agreed to going ahead with Zithromax for the next 5 days and follow-up promptly for any fever, increasing shortness of breath, or other concerns  [name] MD [facility]

## 2018-04-25 ENCOUNTER — Ambulatory Visit: Payer: Medicare Other | Admitting: Family Medicine

## 2018-05-09 ENCOUNTER — Ambulatory Visit (HOSPITAL_COMMUNITY): Payer: Self-pay | Admitting: Psychiatry

## 2018-05-17 ENCOUNTER — Other Ambulatory Visit (HOSPITAL_COMMUNITY): Payer: Self-pay | Admitting: Psychiatry

## 2018-05-17 DIAGNOSIS — F411 Generalized anxiety disorder: Secondary | ICD-10-CM

## 2018-05-29 ENCOUNTER — Other Ambulatory Visit (HOSPITAL_COMMUNITY): Payer: Self-pay

## 2018-05-29 ENCOUNTER — Other Ambulatory Visit (HOSPITAL_COMMUNITY): Payer: Self-pay | Admitting: Psychiatry

## 2018-05-29 DIAGNOSIS — F411 Generalized anxiety disorder: Secondary | ICD-10-CM

## 2018-05-29 DIAGNOSIS — F419 Anxiety disorder, unspecified: Secondary | ICD-10-CM

## 2018-05-29 MED ORDER — LORAZEPAM 0.5 MG PO TABS: 0.5000 mg | ORAL_TABLET | Freq: Every day | ORAL | 0 refills | Status: DC | PRN

## 2018-05-29 MED ORDER — ESCITALOPRAM OXALATE 20 MG PO TABS: 30.0000 mg | ORAL_TABLET | Freq: Every day | ORAL | 0 refills | Status: DC

## 2018-05-30 ENCOUNTER — Ambulatory Visit: Payer: Medicare Other | Admitting: Family Medicine

## 2018-05-30 ENCOUNTER — Encounter: Payer: Self-pay | Admitting: Family Medicine

## 2018-05-30 VITALS — BP 130/78 | HR 79 | Temp 97.5°F | Wt 135.4 lb

## 2018-05-30 DIAGNOSIS — F419 Anxiety disorder, unspecified: Secondary | ICD-10-CM | POA: Diagnosis not present

## 2018-05-30 DIAGNOSIS — K219 Gastro-esophageal reflux disease without esophagitis: Secondary | ICD-10-CM | POA: Diagnosis not present

## 2018-05-30 DIAGNOSIS — D509 Iron deficiency anemia, unspecified: Secondary | ICD-10-CM

## 2018-05-30 DIAGNOSIS — F411 Generalized anxiety disorder: Secondary | ICD-10-CM | POA: Diagnosis not present

## 2018-05-30 MED ORDER — LORAZEPAM 0.5 MG PO TABS: 0.5000 mg | ORAL_TABLET | Freq: Every day | ORAL | 0 refills | Status: DC | PRN

## 2018-05-30 NOTE — Progress Notes (Signed)
  Subjective:     Patient ID: [name], male   DOB: [birth year], 82 y.o.   MRN: [id number]  HPI Patient here to discuss the following issues  He has long history of recurrent anxiety and depression. He's actually seen by psychiatry but apparently his psychiatrist is on medical leave. Takes Lexapro 30 mg daily and as needed lorazepam once daily. He is requesting refill of lorazepam. It appears that his psychiatry office actually approved 30 day supply yesterday but unfortunately this was sent to [facility]  He just returned from [place]. His brother died of brain tumor. He feels like his anxiety was triggered during that trip. Overall though coping fairly well with regard to depression symptoms.  Patient has GERD. On omeprazole. Reflux symptoms controlled. No dysphagia. Denies appetite or weight change. No abdominal pain.  Patient had some iron deficiency back in December. Hemoccults negative. Could not tolerate iron replacement. No recent bloody stools. He is in process of trying to get repeat colonoscopy in the next month. He had previous colonoscopy he states 5-7 years ago and reportedly normal  Past Medical History:  Diagnosis Date  . Anxiety   . BPH (benign prostatic hypertrophy)   . GERD (gastroesophageal reflux disease)   . Sleep apnea    Past Surgical History:  Procedure Laterality Date  . NASAL SEPTOPLASTY W/ TURBINOPLASTY  08/03/2012   Procedure: NASAL SEPTOPLASTY WITH TURBINATE REDUCTION;  Surgeon: Izora Gala, MD;  Location: [facility];  Service: ENT;  Laterality: Bilateral;    reports that he has quit smoking. He has never used smokeless tobacco. He reports that he drinks about 0.6 oz of alcohol per week. He reports that he does not use drugs. family history includes Heart attack in his father; Hypertension in his daughter and father. No Known Allergies   Review of Systems  Constitutional: Negative for appetite change and unexpected weight change.  Respiratory: Negative for  shortness of breath.   Cardiovascular: Negative for chest pain.  Gastrointestinal: Negative for abdominal pain, anal bleeding, blood in stool, constipation, diarrhea, nausea and vomiting.  Neurological: Negative for dizziness and light-headedness.  Psychiatric/Behavioral: The patient is nervous/anxious.        Objective:   Physical Exam  Constitutional: He appears well-developed and well-nourished.  Cardiovascular: Normal rate and regular rhythm.  Pulmonary/Chest: Effort normal and breath sounds normal. He has no wheezes. He has no rales.  Musculoskeletal: He exhibits no edema.  Neurological: He is alert.  Psychiatric: He has a normal mood and affect. His behavior is normal. Judgment and thought content normal.       Assessment:     #1 anxiety and depression. Patient came in requesting lorazepam. He is gets this through psychiatry but his psychiatrist is on medical leave. We did agree to refill #15 tablets of lorazepam until he can follow-up with his psychiatry group.  #2 GERD well controlled on omeprazole  #3 history of mild iron deficiency. Hemoccults negative    Plan:     -Refilled lorazepam 0.5 mg #15 tablets with no refill. He uses this sparingly once daily as needed for severe anxiety symptoms -Recommend repeat labs with CBC, ferritin, TIBC, serum iron. Patient requests scheduling these as future lab -Is encouraged to keep close follow-up with gastroenterology for repeat colonoscopy  Jack Post MD [facility]

## 2018-05-30 NOTE — Patient Instructions (Signed)
Iron-Rich Diet Iron is a mineral that helps your body to produce hemoglobin. Hemoglobin is a protein in your red blood cells that carries oxygen to your body's tissues. Eating too little iron may cause you to feel weak and tired, and it can increase your risk for infection. Eating enough iron is necessary for your body's metabolism, muscle function, and nervous system. Iron is naturally found in many foods. It can also be added to foods or fortified in foods. There are two types of dietary iron:  Heme iron. Heme iron is absorbed by the body more easily than nonheme iron. Heme iron is found in meat, poultry, and fish.  Nonheme iron. Nonheme iron is found in dietary supplements, iron-fortified grains, beans, and vegetables.  You may need to follow an iron-rich diet if:  You have been diagnosed with iron deficiency or iron-deficiency anemia.  You have a condition that prevents you from absorbing dietary iron, such as: ? Infection in your intestines. ? Celiac disease. This involves long-lasting (chronic) inflammation of your intestines.  You do not eat enough iron.  You eat a diet that is high in foods that impair iron absorption.  You have lost a lot of blood.  You have heavy bleeding during your menstrual cycle.  You are pregnant.  What is my plan? Your health care provider may help you to determine how much iron you need per day based on your condition. Generally, when a person consumes sufficient amounts of iron in the diet, the following iron needs are met:  Men. ? 14-18 years old: 11 mg per day. ? 19-50 years old: 8 mg per day.  Women. ? 14-18 years old: 15 mg per day. ? 19-50 years old: 18 mg per day. ? Over 50 years old: 8 mg per day. ? Pregnant women: 27 mg per day. ? Breastfeeding women: 9 mg per day.  What do I need to know about an iron-rich diet?  Eat fresh fruits and vegetables that are high in vitamin C along with foods that are high in iron. This will help  increase the amount of iron that your body absorbs from food, especially with foods containing nonheme iron. Foods that are high in vitamin C include oranges, peppers, tomatoes, and mango.  Take iron supplements only as directed by your health care provider. Overdose of iron can be life-threatening. If you were prescribed iron supplements, take them with orange juice or a vitamin C supplement.  Cook foods in pots and pans that are made from iron.  Eat nonheme iron-containing foods alongside foods that are high in heme iron. This helps to improve your iron absorption.  Certain foods and drinks contain compounds that impair iron absorption. Avoid eating these foods in the same meal as iron-rich foods or with iron supplements. These include: ? Coffee, black tea, and red wine. ? Milk, dairy products, and foods that are high in calcium. ? Beans, soybeans, and peas. ? Whole grains.  When eating foods that contain both nonheme iron and compounds that impair iron absorption, follow these tips to absorb iron better. ? Soak beans overnight before cooking. ? Soak whole grains overnight and drain them before using. ? Ferment flours before baking, such as using yeast in bread dough. What foods can I eat? Grains Iron-fortified breakfast cereal. Iron-fortified whole-wheat bread. Enriched rice. Sprouted grains. Vegetables Spinach. Potatoes with skin. Green peas. Broccoli. Red and green bell peppers. Fermented vegetables. Fruits Prunes. Raisins. Oranges. Strawberries. Mango. Grapefruit. Meats and Other Protein Sources   Beef liver. Oysters. Beef. Shrimp. [place]. Chicken. [place]. Sardines. Chickpeas. Nuts. Tofu. Beverages Tomato juice. Fresh orange juice. Prune juice. Hibiscus tea. Fortified instant breakfast shakes. Condiments Tahini. Fermented soy sauce. Sweets and Desserts Black-strap molasses. Other Wheat germ. The items listed above may not be a complete list of recommended foods or beverages.  Contact your dietitian for more options. What foods are not recommended? Grains Whole grains. Bran cereal. Bran flour. Oats. Vegetables Artichokes. Brussels sprouts. Kale. Fruits Blueberries. Raspberries. Strawberries. Figs. Meats and Other Protein Sources Soybeans. Products made from soy protein. Dairy Milk. Cream. Cheese. Yogurt. Cottage cheese. Beverages Coffee. Black tea. Red wine. Sweets and Desserts Cocoa. Chocolate. Ice cream. Other Basil. Oregano. Parsley. The items listed above may not be a complete list of foods and beverages to avoid. Contact your dietitian for more information. This information is not intended to replace advice given to you by your health care provider. Make sure you discuss any questions you have with your health care provider. Document Released: 05/31/2005 Document Revised: 05/06/2016 Document Reviewed: 05/14/2014 Elsevier Interactive Patient Education  [employer].

## 2018-06-04 ENCOUNTER — Other Ambulatory Visit (INDEPENDENT_AMBULATORY_CARE_PROVIDER_SITE_OTHER): Payer: Medicare Other

## 2018-06-04 DIAGNOSIS — D509 Iron deficiency anemia, unspecified: Secondary | ICD-10-CM

## 2018-06-04 LAB — CBC WITH DIFFERENTIAL/PLATELET
BASOS ABS: 0 10*3/uL (ref 0.0–0.1)
Basophils Relative: 0.7 % (ref 0.0–3.0)
Eosinophils Absolute: 0.1 10*3/uL (ref 0.0–0.7)
Eosinophils Relative: 1.8 % (ref 0.0–5.0)
HCT: 35.6 % — ABNORMAL LOW (ref 39.0–52.0)
Hemoglobin: 11.6 g/dL — ABNORMAL LOW (ref 13.0–17.0)
LYMPHS ABS: 1.4 10*3/uL (ref 0.7–4.0)
Lymphocytes Relative: 25.6 % (ref 12.0–46.0)
MCHC: 32.6 g/dL (ref 30.0–36.0)
MCV: 77.5 fl — ABNORMAL LOW (ref 78.0–100.0)
MONO ABS: 0.7 10*3/uL (ref 0.1–1.0)
Monocytes Relative: 11.8 % (ref 3.0–12.0)
NEUTROS PCT: 60.1 % (ref 43.0–77.0)
Neutro Abs: 3.4 10*3/uL (ref 1.4–7.7)
Platelets: 199 10*3/uL (ref 150.0–400.0)
RBC: 4.59 Mil/uL (ref 4.22–5.81)
RDW: 15.9 % — ABNORMAL HIGH (ref 11.5–15.5)
WBC: 5.6 10*3/uL (ref 4.0–10.5)

## 2018-06-04 LAB — FERRITIN: FERRITIN: 10.9 ng/mL — AB (ref 22.0–322.0)

## 2018-06-05 LAB — IRON,TIBC AND FERRITIN PANEL
%SAT: 14 % (calc) — ABNORMAL LOW (ref 20–48)
FERRITIN: 12 ng/mL — AB (ref 24–380)
IRON: 53 ug/dL (ref 50–180)
TIBC: 382 mcg/dL (calc) (ref 250–425)

## 2018-06-06 ENCOUNTER — Ambulatory Visit (HOSPITAL_COMMUNITY): Payer: Medicare Other | Admitting: Psychiatry

## 2018-06-06 ENCOUNTER — Encounter (HOSPITAL_COMMUNITY): Payer: Self-pay | Admitting: Psychiatry

## 2018-06-06 VITALS — BP 128/80 | HR 88 | Ht 67.0 in | Wt 136.0 lb

## 2018-06-06 DIAGNOSIS — F411 Generalized anxiety disorder: Secondary | ICD-10-CM

## 2018-06-06 DIAGNOSIS — F419 Anxiety disorder, unspecified: Secondary | ICD-10-CM | POA: Diagnosis not present

## 2018-06-06 DIAGNOSIS — F332 Major depressive disorder, recurrent severe without psychotic features: Secondary | ICD-10-CM | POA: Diagnosis not present

## 2018-06-06 MED ORDER — ESCITALOPRAM OXALATE 20 MG PO TABS: 30.0000 mg | ORAL_TABLET | Freq: Every day | ORAL | 0 refills | Status: DC

## 2018-06-06 NOTE — Progress Notes (Signed)
[place] MD/PA/NP OP Progress Note  06/06/2018 1:58 PM TREVINO WYATT  MRN:  [id number]  Chief Complaint: depressed, feel down  HPI: [name] is a 82 year old male with a psychiatric history of major depressive disorder.  He has previously been stable on Celexa until about 1 year ago and has had difficulty maintaining any improvement in his mood and depressive symptoms over the past year.  He has been tried on Celexa, Cymbalta, Lexapro, and Pristiq, in addition to use of Ativan and clonazepam as augmentation for anxiety.  He has struggled to maintain any improvement in his mood over the past 1 year.  He reports that his depression has been worsening over the past 4-6 weeks.  He finds it hard to get out of bed, avoid socialization, avoids activities and hobbies that he knows he would enjoy.  He feels negative and flat, and feels that it is hard to feel happy.  He reports that he tries to put a smile on his face to "fake it".  He reports that he would never harm himself and has no suicidal thoughts, but does acknowledge that he seems to try to avoid active life activities.   He is currently on Lexapro 30 mg, a supratherapeutic dose, which has been partially helpful for him, but again this has been unable to address his depressive symptoms for the past 4-6 weeks.  He is contending with grief related to his brother's passing, but reports that his brother was ill with a brain tumor and it was expected.    I spent time with the patient and his wife discussing treatment options.  His son is a Teacher, music and joined in the visit during a phone call.  I spent time with them discussing augmentation strategies versus proceeding with transcranial magnetic stimulation.  Patient has had a difficult time with medications and tends to be quite sensitive to medications.  We agreed to proceed with transcranial magnetic stimulation and I reviewed the risks including potential risk of seizure in rare circumstances.  We  discussed some of the common sensations associated with [place].  I educated him on the modality of action.  Confirmed with the patient that he does not have any history of seizures, he has no implanted devices in his face or head, no history of brain surgery, aneurysm clips, bullet fragments, pacemaker, vagus stimulator.  He was agreeable to proceed with prior authorization and [place].  Visit Diagnosis:    ICD-10-CM   1. Severe episode of recurrent major depressive disorder, without psychotic features ([place]) F33.2   2. GAD (generalized anxiety disorder) F41.1 escitalopram (LEXAPRO) 20 MG tablet  3. Anxiety F41.9     Past Psychiatric History: See intake H&P for full details. Reviewed, with no updates at this time.   Past Medical History:  Past Medical History:  Diagnosis Date  . Anxiety   . BPH (benign prostatic hypertrophy)   . GERD (gastroesophageal reflux disease)   . Sleep apnea     Past Surgical History:  Procedure Laterality Date  . NASAL SEPTOPLASTY W/ TURBINOPLASTY  08/03/2012   Procedure: NASAL SEPTOPLASTY WITH TURBINATE REDUCTION;  Surgeon: Izora Gala, MD;  Location: [facility] OR;  Service: ENT;  Laterality: Bilateral;    Family Psychiatric History: See intake H&P for full details. Reviewed, with no updates at this time.   Family History:  Family History  Problem Relation Age of Onset  . Heart attack Father   . Hypertension Father   . Hypertension Daughter  Social History:  Social History   Socioeconomic History  . Marital status: Married    Spouse name: Not on file  . Number of children: Not on file  . Years of education: Not on file  . Highest education level: Not on file  Occupational History  . Not on file  Social Needs  . Financial resource strain: Not on file  . Food insecurity:    Worry: Not on file    Inability: Not on file  . Transportation needs:    Medical: Not on file    Non-medical: Not on file  Tobacco Use  . Smoking status: Former Research scientist (life sciences)  .  Smokeless tobacco: Never Used  . Tobacco comment: rare use over 50 years   Substance and Sexual Activity  . Alcohol use: Yes    Alcohol/week: 0.6 oz    Types: 1 Shots of liquor per week    Comment: socially  . Drug use: No  . Sexual activity: Never    Birth control/protection: None  Lifestyle  . Physical activity:    Days per week: Not on file    Minutes per session: Not on file  . Stress: Not on file  Relationships  . Social connections:    Talks on phone: Not on file    Gets together: Not on file    Attends religious service: Not on file    Active member of club or organization: Not on file    Attends meetings of clubs or organizations: Not on file    Relationship status: Not on file  Other Topics Concern  . Not on file  Social History Narrative   Lives in 2 level    Will stay for now    Just moved here approx 1.5 yo   Very nice neighbors    [employer]  x 5 with a very busy holiday   One son is a Teacher, music and nephew is a Investment banker, operational    Son in [place] moved to [place] and he misses his grand children    As 2 brothers in [place]         Allergies: No Known Allergies  Metabolic Disorder Labs: No results found for: HGBA1C, MPG No results found for: PROLACTIN Lab Results  Component Value Date   CHOL 182 07/08/2016   TRIG 53.0 07/08/2016   HDL 66.70 07/08/2016   CHOLHDL 3 07/08/2016   VLDL 10.6 07/08/2016   LDLCALC 104 (H) 07/08/2016   LDLCALC 113 (H) 10/17/2013   Lab Results  Component Value Date   TSH 1.72 08/15/2017   TSH 2.96 07/08/2016    Therapeutic Level Labs: No results found for: LITHIUM No results found for: VALPROATE No components found for:  CBMZ  Current Medications: Current Outpatient Medications  Medication Sig Dispense Refill  . escitalopram (LEXAPRO) 20 MG tablet Take 1.5 tablets (30 mg total) by mouth daily. 135 tablet 0  . hydrocortisone 2.5 % cream Apply topically 2 (two) times daily as needed. 30 g 1  . LORazepam (ATIVAN) 0.5 MG  tablet Take 1 tablet (0.5 mg total) by mouth daily as needed for anxiety. 15 tablet 0  . omeprazole (PRILOSEC) 40 MG capsule TAKE 1 CAPSULE BY MOUTH  DAILY 90 capsule 2  . RA VITAMIN B12 2000 MCG TBCR take 1 tablet by mouth daily 30 tablet 5  . terazosin (HYTRIN) 5 MG capsule TAKE 1 CAPSULE BY MOUTH  DAILY 90 capsule 1   No current facility-administered medications for this visit.  Musculoskeletal: Strength & Muscle Tone: within normal limits Gait & Station: normal Patient leans: N/A  Psychiatric Specialty Exam: ROS  Blood pressure 128/80, pulse 88, height 5\' 7"  (1.702 m), weight 136 lb (61.7 kg).Body mass index is 21.3 kg/m.  General Appearance: Casual and Well Groomed  Eye Contact:  Good  Speech:  Clear and Coherent and Normal Rate  Volume:  Normal  Mood:  Depressed and Dysphoric  Affect:  Congruent  Thought Process:  Goal Directed and Descriptions of Associations: Intact  Orientation:  Full (Time, Place, and Person)  Thought Content: Logical   Suicidal Thoughts:  No  Homicidal Thoughts:  No  Memory:  Immediate;   Good  Judgement:  Good  Insight:  Good  Psychomotor Activity:  Normal  Concentration:  Concentration: Fair  Recall:  Good  Fund of Knowledge: Good  Language: Good  Akathisia:  Negative  Handed:  Right  AIMS (if indicated): not done  Assets:  Communication Skills Desire for Improvement Financial Resources/Insurance Housing Intimacy Leisure Time [place] Talents/Skills Transportation Vocational/Educational  ADL's:  Intact  Cognition: WNL  Sleep:  Fair   Screenings: PHQ2-9     Office Visit from 02/06/2017 in [place] at [place] from 06/17/2016 in [place] at [facility] from 09/08/2015 in [place] at [facility] from 08/29/2014 in [place] at [employer] from 10/14/2013 in [place] at [facility]  Score  1  0  0  0  0       Assessment and Plan:  KAISYN MILLEA is a 82 year old male who presents today with a 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  He has failed therapy with Celexa, Lexapro, Cymbalta, Pristiq.  We agreed to proceed with prior authorization and treatment with the use of transcranial magnetic stimulation.  I have reviewed the risks and benefits and he was able to provide informed consent.  His family, including his wife and son (who is a Teacher, music) are also in agreement with the plan of care.  I spent time with him exploring any other issues, including issues of memory, cognition, and I do not believe that there are any other neuropsychiatric or neurocognitive contributors at this time.  No acute safety issues, we will proceed as below.  1. Severe episode of recurrent major depressive disorder, without psychotic features ([place])   2. GAD (generalized anxiety disorder)   3. Anxiety     Status of current problems: gradually worsening  Labs Ordered: No orders of the defined types were placed in this encounter.   Labs Reviewed: n/a  Collateral Obtained/Records Reviewed: wife is present, son present via phone  Plan:  Continue Lexapro 30 mg Okay to use Ativan 0.5 mg for acute anxiety -I cautioned the patient that this increases his risk of falls and neurocognitive symptoms and cautioned him to only use as needed Proceed with transcranial magnetic stimulation pending prior authorization approval  Aundra Dubin, MD 06/06/2018, 1:58 PM

## 2018-06-07 DIAGNOSIS — Z1211 Encounter for screening for malignant neoplasm of colon: Secondary | ICD-10-CM | POA: Diagnosis not present

## 2018-06-07 DIAGNOSIS — Z86010 Personal history of colonic polyps: Secondary | ICD-10-CM | POA: Diagnosis not present

## 2018-06-07 DIAGNOSIS — D509 Iron deficiency anemia, unspecified: Secondary | ICD-10-CM | POA: Diagnosis not present

## 2018-06-07 DIAGNOSIS — K219 Gastro-esophageal reflux disease without esophagitis: Secondary | ICD-10-CM | POA: Diagnosis not present

## 2018-06-12 ENCOUNTER — Other Ambulatory Visit (HOSPITAL_COMMUNITY): Payer: Self-pay | Admitting: Psychiatry

## 2018-06-12 DIAGNOSIS — F411 Generalized anxiety disorder: Secondary | ICD-10-CM

## 2018-06-14 ENCOUNTER — Telehealth: Payer: Self-pay | Admitting: Hematology and Oncology

## 2018-06-14 ENCOUNTER — Encounter: Payer: Self-pay | Admitting: Hematology and Oncology

## 2018-06-14 NOTE — Telephone Encounter (Signed)
New hematology referral received from Dr. Collene Mares for IDA. Pt has been scheduled to see Dr. Lindi Adie on 9/9 at 345pm. Pt aware to arrive 30 minutes early. Address verified. Letter mailed.

## 2018-06-22 ENCOUNTER — Other Ambulatory Visit (HOSPITAL_COMMUNITY): Payer: Medicare Other | Attending: Psychiatry | Admitting: Emergency Medicine

## 2018-06-22 DIAGNOSIS — F332 Major depressive disorder, recurrent severe without psychotic features: Secondary | ICD-10-CM | POA: Diagnosis not present

## 2018-06-22 NOTE — Progress Notes (Signed)
Pt reported to [facility] for cortical mapping and motor threshold determination for Repetitive Transcranial Magnetic Stimulation treatment for Major Depressive Disorder. Pt completed a PHQ-9 with a score of 24 (severe depression). Pt also completed a Beck's Depression Inventory with a score of 33 (severe depression). Prior to procedure, pt signed an informed consent agreement for [place] treatment. Pt's treatment area was found by applying single pulses to her left motor cortex, hunting along the anterior/posterior plane and along the superior oblique angle until the best motor response was elicited from the pt's right thumb. The best response was observed at 6.0 cm A/P and 40 degrees SOA, with a coil angle of 0 degrees. Pt's motor threshold was calculated using the [employer]'s proprietary MT Assist algorithm, which produced a calculated motor threshold of 1.5 SMT. Per these findings, pt's treatment parameters are as follows: A/P -- 11.5 cm, SOA -- 40 degrees, Coil Angle -- 0 degrees, Motor Threshold --1.25 SMT. With these parameters, the pt will receive 36 sessions of TMS according to the following protocol: 3000 pulses per session, with stimulation in bursts of pulses lasting 4 seconds at a frequency of 10 Hz, separated by 26 seconds of rest. After determining pt's tx parameters, coil was moved to the treatment location, and the first burst of pulses was applied at a reduced power of 80%MT. Power level was titrated to 95%. Pt reported no complaints, and stated that the stimulation was tolerable. Upon completion of mapping, pt completed a few treatment intervals for observation of side effects. Pt tolerated tx well. Pt and wife departed from clinic without issue.

## 2018-06-25 ENCOUNTER — Other Ambulatory Visit (INDEPENDENT_AMBULATORY_CARE_PROVIDER_SITE_OTHER): Payer: Medicare Other | Admitting: Emergency Medicine

## 2018-06-25 DIAGNOSIS — F332 Major depressive disorder, recurrent severe without psychotic features: Secondary | ICD-10-CM

## 2018-06-25 NOTE — Progress Notes (Signed)
Patient reported to [facility] for Repetitive Transcranial Magnetic Stimulation treatment for severe episode of recurrent major depressive disorder, without psychotic features. Patient presented with appropriate affect, level mood and denied any suicidal or homicidal ideations. Patient denies any other current symptoms and remains optimistic with continued [place] treatment. Patient reported no change in alcohol/substane use, caffeine consumption, sleep pattern or metal implant status since previous treatment. Ptreported to tx session withpleasant affect. He reported there was no evident headaches after first day treatment or this weekend. He enjoyed spending time with family and attending social gatherings this weekend. He watched TVperiodically spoke with Probation officer. Power level was titrated to 100%. Powerlevel remains at120%for the duration of the tx.Patient reported nocomplaints anddiscomfort during tx.Patientdeparted post-treatment with no majorreported concerns.

## 2018-06-26 ENCOUNTER — Other Ambulatory Visit (INDEPENDENT_AMBULATORY_CARE_PROVIDER_SITE_OTHER): Payer: Medicare Other | Admitting: Emergency Medicine

## 2018-06-26 DIAGNOSIS — F332 Major depressive disorder, recurrent severe without psychotic features: Secondary | ICD-10-CM | POA: Diagnosis not present

## 2018-06-26 NOTE — Progress Notes (Signed)
Patient reported to [facility] for Repetitive Transcranial Magnetic Stimulation treatment for severe episode of recurrent major depressive disorder, without psychotic features. Patient presented with appropriate affect, level mood and denied any suicidal or homicidal ideations. Patient denies any other current symptoms and remains optimistic with continued [place] treatment. Patient reported no change in alcohol/substane use, caffeine consumption, sleep pattern or metal implant status since previous treatment. Ptreported to tx session withpleasant affect. He shared that he has been running errands earlier this morning. He watched CNN andperiodically spoke with Probation officer. Power level was titrated to 115%. Powerlevel remains at120%for the duration of the tx.Patient reported nocomplaints anddiscomfort during tx.Patientdeparted post-treatment with no majorreported concerns.

## 2018-06-27 ENCOUNTER — Other Ambulatory Visit (INDEPENDENT_AMBULATORY_CARE_PROVIDER_SITE_OTHER): Payer: Medicare Other | Admitting: Emergency Medicine

## 2018-06-27 DIAGNOSIS — F332 Major depressive disorder, recurrent severe without psychotic features: Secondary | ICD-10-CM | POA: Diagnosis not present

## 2018-06-27 NOTE — Progress Notes (Signed)
Patient reported to [facility] for Repetitive Transcranial Magnetic Stimulation treatment for severe episode of recurrent major depressive disorder, without psychotic features. Patient presented with appropriate affect, level mood and denied any suicidal or homicidal ideations. Patient denies any other current symptoms and remains optimistic with continued [place] treatment. Patient reported no change in alcohol/substane use, caffeine consumption, sleep pattern or metal implant status since previous treatment. Ptreported to tx session withpleasant affect.He watched TVperiodically spoke with Probation officer. He requested to stay at 115% today. He wants to become more acclimated before reaching 120%. Powerlevel remains at120%for the duration of the tx.Patient reported nocomplaints anddiscomfort during tx.Patientdeparted post-treatment with no majorreported concerns.

## 2018-06-28 ENCOUNTER — Other Ambulatory Visit (INDEPENDENT_AMBULATORY_CARE_PROVIDER_SITE_OTHER): Payer: Medicare Other | Admitting: Emergency Medicine

## 2018-06-28 DIAGNOSIS — F332 Major depressive disorder, recurrent severe without psychotic features: Secondary | ICD-10-CM

## 2018-06-28 NOTE — Progress Notes (Signed)
Patient reported to [facility] for Repetitive Transcranial Magnetic Stimulation treatment for severe episode of recurrent major depressive disorder, without psychotic features. Patient presented with appropriate affect, level mood and denied any suicidal or homicidal ideations. Patient denies any other current symptoms and remains optimistic with continued [place] treatment. Patient reported no change in alcohol/substane use, caffeine consumption, sleep pattern or metal implant status since previous treatment. Ptreported to tx session withpleasant affect. He watched CNN andperiodically spoke with Probation officer.Power level was titrated to 120%.Powerlevel remains at120%for the duration of the tx.Patient reported nocomplaints anddiscomfort during tx.Patientdeparted post-treatment with no majorreported concerns.

## 2018-06-29 ENCOUNTER — Encounter (HOSPITAL_COMMUNITY): Payer: Self-pay

## 2018-06-29 ENCOUNTER — Other Ambulatory Visit (INDEPENDENT_AMBULATORY_CARE_PROVIDER_SITE_OTHER): Payer: Medicare Other | Admitting: Emergency Medicine

## 2018-06-29 DIAGNOSIS — F332 Major depressive disorder, recurrent severe without psychotic features: Secondary | ICD-10-CM | POA: Diagnosis not present

## 2018-06-29 NOTE — Progress Notes (Signed)
Patient reported to [facility] for Repetitive Transcranial Magnetic Stimulation treatment for severe episode of recurrent major depressive disorder, without psychotic features. Patient presented with appropriate affect, level mood and denied any suicidal or homicidal ideations. Patient denies any other current symptoms and remains optimistic with continued [place] treatment. Patient reported no change in alcohol/substane use, caffeine consumption, sleep pattern or metal implant status since previous treatment. Ptreported to tx session withpleasant affect.He watchedCNN andperiodically spoke with Probation officer.Power level was titrated to 120%.Powerlevel remains at120%for the duration of the tx.Patient reported nocomplaints anddiscomfort during tx.Patientdeparted post-treatment with no majorreported concerns.

## 2018-07-03 ENCOUNTER — Other Ambulatory Visit (HOSPITAL_COMMUNITY): Payer: Medicare Other | Attending: Psychiatry

## 2018-07-03 DIAGNOSIS — F332 Major depressive disorder, recurrent severe without psychotic features: Secondary | ICD-10-CM

## 2018-07-03 NOTE — Progress Notes (Signed)
Patient reported to [facility] for Repetitive Transcranial Magnetic Stimulation treatment for severe episode of recurrent major depressive disorder, without psychotic features. Patient presented with appropriate affect, level mood and denied any suicidal or homicidal ideations. Patient denies any other current symptoms and remains optimistic with continued [place] tx. Patient reported no changed in alcohol/substance use, caffeine consumption, sleep pattern or metal implant status since previous tx.  Patient reported that he had a good weekend and was feeling good today. Patient reported no complaints or discomfort during the duration of tx. Patient watched television. Patient departed post-treatment with no reported concerns.

## 2018-07-04 ENCOUNTER — Other Ambulatory Visit (INDEPENDENT_AMBULATORY_CARE_PROVIDER_SITE_OTHER): Payer: Medicare Other

## 2018-07-04 DIAGNOSIS — F332 Major depressive disorder, recurrent severe without psychotic features: Secondary | ICD-10-CM

## 2018-07-04 NOTE — Progress Notes (Signed)
Patient reported to [facility] for Repetitive Transcranial Magnetic Stimulation treatment for severe episode of recurrent major depressive disorder, without psychotic features. Patient presented with appropriate affect, level mood and denied any suicidal or homicidal ideations. Patient denies any other current symptoms and remains optimistic with continued [place] tx. Patient reported no changed in alcohol/substance use, caffeine consumption, sleep pattern or metal implant status since previous tx.Patient reported no complaints or discomfort during the duration of tx. Patient watched television. Patient departed post-treatment with no reported concerns.

## 2018-07-05 ENCOUNTER — Other Ambulatory Visit (INDEPENDENT_AMBULATORY_CARE_PROVIDER_SITE_OTHER): Payer: Medicare Other

## 2018-07-05 DIAGNOSIS — F332 Major depressive disorder, recurrent severe without psychotic features: Secondary | ICD-10-CM | POA: Diagnosis not present

## 2018-07-05 NOTE — Progress Notes (Signed)
Patient reported to [facility] for Repetitive Transcranial Magnetic Stimulation treatment for severe episode of recurrent major depressive disorder, without psychotic features. Patient presented with appropriate affect, level mood and denied any suicidal or homicidal ideations. Patient denies any other current symptoms and remains optimistic with continued [place] tx. Patient reported no changed in alcohol/substance use, caffeine consumption, sleep pattern or metal implant status since previous tx. Patient reported he was having a good day today. Patient reported no complaints or discomfort during the duration of tx. Patient watched television for the duration of tx. Patient departed post-treatment with no reported concerns.

## 2018-07-06 ENCOUNTER — Other Ambulatory Visit (INDEPENDENT_AMBULATORY_CARE_PROVIDER_SITE_OTHER): Payer: Medicare Other

## 2018-07-06 DIAGNOSIS — F332 Major depressive disorder, recurrent severe without psychotic features: Secondary | ICD-10-CM | POA: Diagnosis not present

## 2018-07-06 NOTE — Progress Notes (Signed)
Patient reported to [facility] for Repetitive Transcranial Magnetic Stimulation treatment for severe episode of recurrent major depressive disorder, without psychotic features. Patient presented with appropriate affect, level mood and denied any suicidal or homicidal ideations. Patient denies any other current symptoms and remains optimistic with continued [place] tx. Patient reported no changed in alcohol/substance use, caffeine consumption, sleep pattern or metal implant status since previous tx. Patient reported no complaints or discomfort during the duration of tx. Patient watched television and played a game on his phone during tx. Patient departed post-treatment with no reported concerns.

## 2018-07-09 ENCOUNTER — Encounter (HOSPITAL_COMMUNITY): Payer: Self-pay | Admitting: Emergency Medicine

## 2018-07-09 ENCOUNTER — Telehealth: Payer: Self-pay | Admitting: Hematology and Oncology

## 2018-07-09 ENCOUNTER — Inpatient Hospital Stay: Payer: Medicare Other | Attending: Hematology and Oncology | Admitting: Hematology and Oncology

## 2018-07-09 DIAGNOSIS — Z79899 Other long term (current) drug therapy: Secondary | ICD-10-CM | POA: Diagnosis not present

## 2018-07-09 DIAGNOSIS — K909 Intestinal malabsorption, unspecified: Secondary | ICD-10-CM | POA: Diagnosis not present

## 2018-07-09 DIAGNOSIS — D509 Iron deficiency anemia, unspecified: Secondary | ICD-10-CM | POA: Diagnosis not present

## 2018-07-09 DIAGNOSIS — F419 Anxiety disorder, unspecified: Secondary | ICD-10-CM | POA: Diagnosis not present

## 2018-07-09 DIAGNOSIS — Z87891 Personal history of nicotine dependence: Secondary | ICD-10-CM | POA: Diagnosis not present

## 2018-07-09 NOTE — Progress Notes (Signed)
[facility] NOTE  Patient Care Team: [name], MD as PCP - General (Family Medicine) Consulting physician: Dr. Collene Mares  CHIEF COMPLAINTS/PURPOSE OF CONSULTATION:  Iron deficiency anemia  HISTORY OF PRESENTING ILLNESS:  [name] 82 y.o. male is here because of recent diagnosis of iron deficiency anemia.  Patient has had long-standing issues with iron deficiency and has been on oral iron therapy intermittently in the past.  It appears that he cannot tolerate oral iron because of some gastritis and constipation.  Lately has been feeling significant fatigue and went to see Dr. Collene Mares who performed blood work and noticed that his ferritin levels were significantly low and he was iron deficient.  Because of this he was referred to [name] for further evaluation.  There appears to be also a few other issues related to depression that he may be struggling with.  He attributed his fatigue to depression.  He has not had any blood in the stools and had previous endoscopies which were normal.  I reviewed her records extensively and collaborated the history with the patient.  MEDICAL HISTORY:  Past Medical History:  Diagnosis Date  . Anxiety   . BPH (benign prostatic hypertrophy)   . GERD (gastroesophageal reflux disease)   . Sleep apnea     SURGICAL HISTORY: Past Surgical History:  Procedure Laterality Date  . NASAL SEPTOPLASTY W/ TURBINOPLASTY  08/03/2012   Procedure: NASAL SEPTOPLASTY WITH TURBINATE REDUCTION;  Surgeon: Izora Gala, MD;  Location: [facility];  Service: ENT;  Laterality: Bilateral;    SOCIAL HISTORY: Social History   Socioeconomic History  . Marital status: Married    Spouse name: Not on file  . Number of children: Not on file  . Years of education: Not on file  . Highest education level: Not on file  Occupational History  . Not on file  Social Needs  . Financial resource strain: Not on file  . Food insecurity:    Worry: Not on file     Inability: Not on file  . Transportation needs:    Medical: Not on file    Non-medical: Not on file  Tobacco Use  . Smoking status: Former Research scientist (life sciences)  . Smokeless tobacco: Never Used  . Tobacco comment: rare use over 50 years   Substance and Sexual Activity  . Alcohol use: Yes    Alcohol/week: 1.0 standard drinks    Types: 1 Shots of liquor per week    Comment: socially  . Drug use: No  . Sexual activity: Never    Birth control/protection: None  Lifestyle  . Physical activity:    Days per week: Not on file    Minutes per session: Not on file  . Stress: Not on file  Relationships  . Social connections:    Talks on phone: Not on file    Gets together: Not on file    Attends religious service: Not on file    Active member of club or organization: Not on file    Attends meetings of clubs or organizations: Not on file    Relationship status: Not on file  . Intimate partner violence:    Fear of current or ex partner: Not on file    Emotionally abused: Not on file    Physically abused: Not on file    Forced sexual activity: Not on file  Other Topics Concern  . Not on file  Social History Narrative   Lives in 2 level  Will stay for now    Just moved here approx 1.5 yo   Very nice neighbors    [employer]  x 5 with a very busy holiday   One son is a Teacher, music and nephew is a Investment banker, operational    Son in [place] moved to [place] and he misses his grand children    As 2 brothers in [place]: Family History  Problem Relation Age of Onset  . Heart attack Father   . Hypertension Father   . Hypertension Daughter     ALLERGIES:  has No Known Allergies.  MEDICATIONS:  Current Outpatient Medications  Medication Sig Dispense Refill  . escitalopram (LEXAPRO) 20 MG tablet Take 1.5 tablets (30 mg total) by mouth daily. 135 tablet 0  . hydrocortisone 2.5 % cream Apply topically 2 (two) times daily as needed. 30 g 1  . LORazepam (ATIVAN) 0.5 MG tablet Take 1 tablet  (0.5 mg total) by mouth daily as needed for anxiety. 15 tablet 0  . omeprazole (PRILOSEC) 40 MG capsule TAKE 1 CAPSULE BY MOUTH  DAILY 90 capsule 2  . RA VITAMIN B12 2000 MCG TBCR take 1 tablet by mouth daily 30 tablet 5  . terazosin (HYTRIN) 5 MG capsule TAKE 1 CAPSULE BY MOUTH  DAILY 90 capsule 1   No current facility-administered medications for this visit.     REVIEW OF SYSTEMS:   Constitutional: Complains of fatigue Eyes: Denies blurriness of vision, double vision or watery eyes Ears, nose, mouth, throat, and face: Denies mucositis or sore throat Respiratory: Denies cough, dyspnea or wheezes Cardiovascular: Denies palpitation, chest discomfort or lower extremity swelling Gastrointestinal:  Denies nausea, heartburn or change in bowel habits Skin: Denies abnormal skin rashes Lymphatics: Denies new lymphadenopathy or easy bruising Neurological:Denies numbness, tingling or new weaknesses Behavioral/Psych: Mood is stable, no new changes   All other systems were reviewed with the patient and are negative.  PHYSICAL EXAMINATION: ECOG PERFORMANCE STATUS: 1 - Symptomatic but completely ambulatory  Vitals:   07/09/18 1551  BP: (!) 152/67  Pulse: 61  Resp: 17  Temp: 98.4 F (36.9 C)  SpO2: 100%   Filed Weights   07/09/18 1551  Weight: 137 lb 6.4 oz (62.3 kg)    GENERAL:alert, no distress and comfortable SKIN: skin color, texture, turgor are normal, no rashes or significant lesions EYES: normal, conjunctiva are pink and non-injected, sclera clear OROPHARYNX:no exudate, no erythema and lips, buccal mucosa, and tongue normal  NECK: supple, thyroid normal size, non-tender, without nodularity LYMPH:  no palpable lymphadenopathy in the cervical, axillary or inguinal LUNGS: clear to auscultation and percussion with normal breathing effort HEART: regular rate & rhythm and no murmurs and no lower extremity edema ABDOMEN:abdomen soft, non-tender and normal bowel  sounds Musculoskeletal:no cyanosis of digits and no clubbing  PSYCH: alert & oriented x 3 with fluent speech NEURO: no focal motor/sensory deficits LABORATORY DATA:  I have reviewed the data as listed Lab Results  Component Value Date   WBC 5.6 06/04/2018   HGB 11.6 (L) 06/04/2018   HCT 35.6 (L) 06/04/2018   MCV 77.5 (L) 06/04/2018   PLT 199.0 06/04/2018   Lab Results  Component Value Date   NA 138 10/13/2017   K 4.9 10/13/2017   CL 103 10/13/2017   CO2 25 10/13/2017    RADIOGRAPHIC STUDIES: I have personally reviewed the radiological reports and agreed with the findings in the report.  ASSESSMENT AND PLAN:  Iron deficiency anemia Iron deficiency anemia: I discussed with the patient the process of iron absorption. I counseled extensively regarding the different causes of iron deficiency including blood loss and malabsorption. Patient has had upper endoscopies and colonoscopies and did not have any clear identified source of blood loss.  However malabsorption is also a possibility. Patient is intolerant of oral iron therapy.  Because of this I recommended IV iron.  Return to clinic in 3 months with labs done 1 day ahead of time  Recommendation: 1. Stop oral iron 2. proceed with 2 doses of IV iron infusion with Feraheme  I discussed with the patient that potentially there may be a need for additional IV iron infusions if the iron levels were to remain low in the future. The frequency of need of IV iron would depend on many other factors including the rate of loss and by the degree of absorption.  Return to clinic in 3 months with recheck on iron studies and hemoglobin.  All questions were answered. The patient knows to call the clinic with any problems, questions or concerns.    Harriette Ohara, MD 07/09/18

## 2018-07-09 NOTE — Telephone Encounter (Signed)
Gave avs and calendar ° °

## 2018-07-09 NOTE — Assessment & Plan Note (Signed)
Iron deficiency anemia: I discussed with the patient the process of iron absorption. I counseled extensively regarding the different causes of iron deficiency including blood loss and malabsorption. Patient has had upper endoscopies and colonoscopies and did not have any clear identified source of blood loss.  However malabsorption is also a possibility. Patient is intolerant of oral iron therapy.  Because of this I recommended IV iron.  Return to clinic in 3 months with labs done 1 day ahead of time  Recommendation: 1. Stop oral iron 2. proceed with 2 doses of IV iron infusion with Feraheme  I discussed with the patient that potentially there may be a need for additional IV iron infusions if the iron levels were to remain low in the future. The frequency of need of IV iron would depend on many other factors including the rate of loss and by the degree of absorption.  Return to clinic in 3 months with recheck on iron studies and hemoglobin.

## 2018-07-10 ENCOUNTER — Other Ambulatory Visit (INDEPENDENT_AMBULATORY_CARE_PROVIDER_SITE_OTHER): Payer: Medicare Other | Admitting: Emergency Medicine

## 2018-07-10 DIAGNOSIS — F332 Major depressive disorder, recurrent severe without psychotic features: Secondary | ICD-10-CM | POA: Diagnosis not present

## 2018-07-10 NOTE — Progress Notes (Signed)
Patient reported to [facility] for Repetitive Transcranial Magnetic Stimulation treatment for severe episode of recurrent major depressive disorder, without psychotic features. Patient presented with appropriate affect, level mood and denied any suicidal or homicidal ideations. Patient denies any other current symptoms and remains optimistic with continued [place] treatment. Patient reported no change in alcohol/substane use, caffeine consumption, sleep pattern or metal implant status since previous treatment. Ptreported to tx session withpleasant affect.He shared that he has been feeling better. He has started to play golf with his friends.He spoke with Probation officer during tx session.Powerlevel remains at120%for the duration of the tx.Patient reported nocomplaints anddiscomfort during tx.Patientdeparted post-treatment with no majorreported concerns.rns.

## 2018-07-11 ENCOUNTER — Other Ambulatory Visit (HOSPITAL_COMMUNITY): Payer: Medicare Other | Admitting: Emergency Medicine

## 2018-07-12 ENCOUNTER — Ambulatory Visit (HOSPITAL_COMMUNITY): Payer: Self-pay | Admitting: Psychiatry

## 2018-07-12 ENCOUNTER — Other Ambulatory Visit (INDEPENDENT_AMBULATORY_CARE_PROVIDER_SITE_OTHER): Payer: Medicare Other | Admitting: Emergency Medicine

## 2018-07-12 ENCOUNTER — Encounter (HOSPITAL_COMMUNITY): Payer: Self-pay

## 2018-07-12 DIAGNOSIS — F332 Major depressive disorder, recurrent severe without psychotic features: Secondary | ICD-10-CM

## 2018-07-12 NOTE — Progress Notes (Signed)
Patient reported to [facility] for Repetitive Transcranial Magnetic Stimulation treatment for severe episode of recurrent major depressive disorder, without psychotic features. Patient presented with appropriate affect, level mood and denied any suicidal or homicidal ideations. Patient denies any other current symptoms and remains optimistic with continued [place] treatment. Patient reported no change in alcohol/substane use, caffeine consumption, sleep pattern or metal implant status since previous treatment. Ptreported to tx session withpleasant affect.He shared that he went to play golf with his friend. He had a great time.He watched TV during tx session.Powerlevel remains at120%for the duration of the tx.Patient reported nocomplaints anddiscomfort during tx.Patientdeparted post-treatment with no majorreported concerns.rns.

## 2018-07-13 ENCOUNTER — Other Ambulatory Visit (INDEPENDENT_AMBULATORY_CARE_PROVIDER_SITE_OTHER): Payer: Medicare Other | Admitting: Emergency Medicine

## 2018-07-13 ENCOUNTER — Inpatient Hospital Stay: Payer: Medicare Other

## 2018-07-13 VITALS — BP 138/75 | HR 62 | Temp 98.2°F | Resp 16

## 2018-07-13 DIAGNOSIS — K909 Intestinal malabsorption, unspecified: Secondary | ICD-10-CM | POA: Diagnosis not present

## 2018-07-13 DIAGNOSIS — Z87891 Personal history of nicotine dependence: Secondary | ICD-10-CM | POA: Diagnosis not present

## 2018-07-13 DIAGNOSIS — D509 Iron deficiency anemia, unspecified: Secondary | ICD-10-CM

## 2018-07-13 DIAGNOSIS — Z79899 Other long term (current) drug therapy: Secondary | ICD-10-CM | POA: Diagnosis not present

## 2018-07-13 DIAGNOSIS — F332 Major depressive disorder, recurrent severe without psychotic features: Secondary | ICD-10-CM | POA: Diagnosis not present

## 2018-07-13 MED ORDER — SODIUM CHLORIDE 0.9 % IV SOLN
Freq: Once | INTRAVENOUS | Status: AC
  Administered 2018-07-13: 12:00:00 via INTRAVENOUS
  Filled 2018-07-13: qty 250

## 2018-07-13 MED ORDER — SODIUM CHLORIDE 0.9 % IV SOLN
510.0000 mg | Freq: Once | INTRAVENOUS | Status: AC
  Administered 2018-07-13: 510 mg via INTRAVENOUS
  Filled 2018-07-13: qty 17

## 2018-07-13 NOTE — Progress Notes (Signed)
Patient reported to [facility] for Repetitive Transcranial Magnetic Stimulation treatment for severe episode of recurrent major depressive disorder, without psychotic features. Patient presented with appropriate affect, level mood and denied any suicidal or homicidal ideations. Patient denies any other current symptoms and remains optimistic with continued [place] treatment. Patient reported no change in alcohol/substane use, caffeine consumption, sleep pattern or metal implant status since previous treatment. Ptreported to tx session withpleasant affect with wife.He watched TV during tx session and spoke with Probation officer and wife.Powerlevel remains at120%for the duration of the tx.Patient reported nocomplaints anddiscomfort during tx.Patientand wifedeparted post-treatment with no majorreported concerns.rns.

## 2018-07-13 NOTE — Patient Instructions (Signed)

## 2018-07-16 ENCOUNTER — Other Ambulatory Visit (INDEPENDENT_AMBULATORY_CARE_PROVIDER_SITE_OTHER): Payer: Medicare Other | Admitting: Emergency Medicine

## 2018-07-16 DIAGNOSIS — F332 Major depressive disorder, recurrent severe without psychotic features: Secondary | ICD-10-CM | POA: Diagnosis not present

## 2018-07-16 NOTE — Progress Notes (Signed)
Patient reported to [facility] for Repetitive Transcranial Magnetic Stimulation treatment for severe episode of recurrent major depressive disorder, without psychotic features. Patient presented with appropriate affect, level mood and denied any suicidal or homicidal ideations. Patient denies any other current symptoms and remains optimistic with continued [place] treatment. Patient reported no change in alcohol/substane use, caffeine consumption, sleep pattern or metal implant status since previous treatment. Ptreported to tx session withpleasant affect.He spoke with Probation officer during tx session. Powerlevel remains at120%for the duration of the tx.Patient reported nocomplaints anddiscomfort during tx.Patientand wifedeparted post-treatment with no majorreported concerns.rns.

## 2018-07-17 ENCOUNTER — Other Ambulatory Visit (INDEPENDENT_AMBULATORY_CARE_PROVIDER_SITE_OTHER): Payer: Medicare Other | Admitting: Emergency Medicine

## 2018-07-17 ENCOUNTER — Other Ambulatory Visit: Payer: Self-pay | Admitting: Family Medicine

## 2018-07-17 DIAGNOSIS — F332 Major depressive disorder, recurrent severe without psychotic features: Secondary | ICD-10-CM

## 2018-07-17 NOTE — Progress Notes (Signed)
Patient reported to [facility] for Repetitive Transcranial Magnetic Stimulation treatment for severe episode of recurrent major depressive disorder, without psychotic features. Patient presented with appropriate affect, level mood and denied any suicidal or homicidal ideations. Patient denies any other current symptoms and remains optimistic with continued [place] treatment. Patient reported no change in alcohol/substane use, caffeine consumption, sleep pattern or metal implant status since previous treatment. Ptreported to tx session withpleasant affect.He spoke with Probation officer during tx session. He had the energy to play golf with his friends this morning and enjoyed himself. Powerlevel remains at120%for the duration of the tx.Patient reported nocomplaints anddiscomfort during tx.Patientand wifedeparted post-treatment with no majorreported concerns.

## 2018-07-18 ENCOUNTER — Other Ambulatory Visit (INDEPENDENT_AMBULATORY_CARE_PROVIDER_SITE_OTHER): Payer: Medicare Other | Admitting: Emergency Medicine

## 2018-07-18 DIAGNOSIS — F332 Major depressive disorder, recurrent severe without psychotic features: Secondary | ICD-10-CM | POA: Diagnosis not present

## 2018-07-18 NOTE — Progress Notes (Signed)
Patient reported to [facility] for Repetitive Transcranial Magnetic Stimulation treatment for severe episode of recurrent major depressive disorder, without psychotic features. Patient presented with appropriate affect, level mood and denied any suicidal or homicidal ideations. Patient denies any other current symptoms and remains optimistic with continued [place] treatment. Patient reported no change in alcohol/substane use, caffeine consumption, sleep pattern or metal implant status since previous treatment. Ptreported to tx session withpleasant affect.He spoke with Probation officer during tx session. Powerlevel remains at120%for the duration of the tx.Patient reported nocomplaints anddiscomfort during tx.Patientand wifedeparted post-treatment with no majorreported concerns.rns.

## 2018-07-19 ENCOUNTER — Other Ambulatory Visit (INDEPENDENT_AMBULATORY_CARE_PROVIDER_SITE_OTHER): Payer: Medicare Other | Admitting: Emergency Medicine

## 2018-07-19 ENCOUNTER — Encounter (HOSPITAL_COMMUNITY): Payer: Self-pay

## 2018-07-19 DIAGNOSIS — F332 Major depressive disorder, recurrent severe without psychotic features: Secondary | ICD-10-CM

## 2018-07-19 NOTE — Progress Notes (Signed)
Patient reported to [facility] for Repetitive Transcranial Magnetic Stimulation treatment for severe episode of recurrent major depressive disorder, without psychotic features. Patient presented with appropriate affect, level mood and denied any suicidal or homicidal ideations. Patient denies any other current symptoms and remains optimistic with continued [place] treatment. Patient reported no change in alcohol/substane use, caffeine consumption, sleep pattern or metal implant status since previous treatment. Ptreported to tx session withpleasant affect.He spoke with Probation officer during tx session. He shared that he has started doing yard work - noticing improvement from [employer].Powerlevel remains at120%for the duration of the tx.Patient reported nocomplaints anddiscomfort during tx.Patientand wifedeparted post-treatment with no majorreported concerns.rns.

## 2018-07-20 ENCOUNTER — Inpatient Hospital Stay: Payer: Medicare Other

## 2018-07-20 ENCOUNTER — Other Ambulatory Visit (INDEPENDENT_AMBULATORY_CARE_PROVIDER_SITE_OTHER): Payer: Medicare Other | Admitting: Emergency Medicine

## 2018-07-20 VITALS — BP 160/67 | HR 60 | Temp 97.9°F | Resp 18

## 2018-07-20 DIAGNOSIS — Z87891 Personal history of nicotine dependence: Secondary | ICD-10-CM | POA: Diagnosis not present

## 2018-07-20 DIAGNOSIS — D509 Iron deficiency anemia, unspecified: Secondary | ICD-10-CM

## 2018-07-20 DIAGNOSIS — F332 Major depressive disorder, recurrent severe without psychotic features: Secondary | ICD-10-CM | POA: Diagnosis not present

## 2018-07-20 DIAGNOSIS — K909 Intestinal malabsorption, unspecified: Secondary | ICD-10-CM | POA: Diagnosis not present

## 2018-07-20 DIAGNOSIS — Z79899 Other long term (current) drug therapy: Secondary | ICD-10-CM | POA: Diagnosis not present

## 2018-07-20 MED ORDER — SODIUM CHLORIDE 0.9 % IV SOLN
Freq: Once | INTRAVENOUS | Status: AC
  Administered 2018-07-20: 14:00:00 via INTRAVENOUS
  Filled 2018-07-20: qty 250

## 2018-07-20 MED ORDER — SODIUM CHLORIDE 0.9 % IV SOLN
510.0000 mg | Freq: Once | INTRAVENOUS | Status: AC
  Administered 2018-07-20: 510 mg via INTRAVENOUS
  Filled 2018-07-20: qty 17

## 2018-07-20 NOTE — Patient Instructions (Signed)

## 2018-07-20 NOTE — Progress Notes (Signed)
Patient reported to [facility] for Repetitive Transcranial Magnetic Stimulation treatment for severe episode of recurrent major depressive disorder, without psychotic features. Patient presented with appropriate affect, level mood and denied any suicidal or homicidal ideations. Patient denies any other current symptoms and remains optimistic with continued [place] treatment. Patient reported no change in alcohol/substane use, caffeine consumption, sleep pattern or metal implant status since previous treatment. Ptreported to tx session withpleasant affect.He spoke with Probation officer during tx session.Powerlevel remains at120%for the duration of the tx.Patient reported nocomplaints anddiscomfort during tx.Patientand wifedeparted post-treatment with no majorreported concerns.rns.

## 2018-07-23 ENCOUNTER — Other Ambulatory Visit (INDEPENDENT_AMBULATORY_CARE_PROVIDER_SITE_OTHER): Payer: Medicare Other | Admitting: Emergency Medicine

## 2018-07-23 DIAGNOSIS — F332 Major depressive disorder, recurrent severe without psychotic features: Secondary | ICD-10-CM | POA: Diagnosis not present

## 2018-07-23 NOTE — Progress Notes (Signed)
Patient reported to [facility] for Repetitive Transcranial Magnetic Stimulation treatment for severe episode of recurrent major depressive disorder, without psychotic features. Patient presented with appropriate affect, level mood and denied any suicidal or homicidal ideations. Patient denies any other current symptoms and remains optimistic with continued [place] treatment. Patient reported no change in alcohol/substane use, caffeine consumption, sleep pattern or metal implant status since previous treatment. Ptreported to tx session withpleasant affect.He spoke with Probation officer during tx session.He shared that he went golfing with his friends and enjoyed himself. Powerlevel remains at120%for the duration of the tx.Patient reported nocomplaints anddiscomfort during tx.Patientand wifedeparted post-treatment with no majorreported concerns.rns.

## 2018-07-24 ENCOUNTER — Encounter (HOSPITAL_COMMUNITY): Payer: Self-pay

## 2018-07-24 ENCOUNTER — Other Ambulatory Visit (INDEPENDENT_AMBULATORY_CARE_PROVIDER_SITE_OTHER): Payer: Medicare Other | Admitting: Emergency Medicine

## 2018-07-24 DIAGNOSIS — F332 Major depressive disorder, recurrent severe without psychotic features: Secondary | ICD-10-CM | POA: Diagnosis not present

## 2018-07-24 NOTE — Progress Notes (Signed)
Patient reported to [facility] for Repetitive Transcranial Magnetic Stimulation treatment for severe episode of recurrent major depressive disorder, without psychotic features. Patient presented with appropriate affect, level mood and denied any suicidal or homicidal ideations. Patient denies any other current symptoms and remains optimistic with continued [place] treatment. Patient reported no change in alcohol/substane use, caffeine consumption, sleep pattern or metal implant status since previous treatment. Ptreported to tx session withpleasant affect.He spoke with Probation officer during tx session.Powerlevel remains at120%for the duration of the tx.Patient reported nocomplaints anddiscomfort during tx.Patientand wifedeparted post-treatment with no majorreported concerns.

## 2018-07-25 ENCOUNTER — Other Ambulatory Visit (INDEPENDENT_AMBULATORY_CARE_PROVIDER_SITE_OTHER): Payer: Medicare Other | Admitting: Emergency Medicine

## 2018-07-25 DIAGNOSIS — F332 Major depressive disorder, recurrent severe without psychotic features: Secondary | ICD-10-CM

## 2018-07-25 NOTE — Progress Notes (Signed)
Patient reported to [facility] for Repetitive Transcranial Magnetic Stimulation treatment for severe episode of recurrent major depressive disorder, without psychotic features. Patient presented with appropriate affect, level mood and denied any suicidal or homicidal ideations. Patient denies any other current symptoms and remains optimistic with continued [place] treatment. Patient reported no change in alcohol/substane use, caffeine consumption, sleep pattern or metal implant status since previous treatment. Ptreported to tx session withpleasant affect.He spoke with Probation officer during tx session.Powerlevel remains at120%for the duration of the tx.Patient reported nocomplaints anddiscomfort during tx.Patientand wifedeparted post-treatment with no majorreported concerns.

## 2018-07-26 ENCOUNTER — Other Ambulatory Visit (INDEPENDENT_AMBULATORY_CARE_PROVIDER_SITE_OTHER): Payer: Medicare Other | Admitting: Emergency Medicine

## 2018-07-26 DIAGNOSIS — F332 Major depressive disorder, recurrent severe without psychotic features: Secondary | ICD-10-CM | POA: Diagnosis not present

## 2018-07-26 NOTE — Progress Notes (Signed)
Patient reported to [facility] for Repetitive Transcranial Magnetic Stimulation treatment for severe episode of recurrent major depressive disorder, without psychotic features. Patient presented with appropriate affect, level mood and denied any suicidal or homicidal ideations. Patient denies any other current symptoms and remains optimistic with continued [place] treatment. Patient reported no change in alcohol/substane use, caffeine consumption, sleep pattern or metal implant status since previous treatment. Ptreported to tx session withpleasant affect.He spoke with Probation officer during tx session. He shared that he had the energy to complete yard work. Powerlevel remains at120%for the duration of the tx.Patient reported nocomplaints anddiscomfort during tx.Patientand wifedeparted post-treatment with no majorreported concerns.

## 2018-07-26 NOTE — Addendum Note (Signed)
Addended by: Dionne Bucy on: 07/26/2018 02:46 PM   Modules accepted: Level of Service

## 2018-07-27 ENCOUNTER — Other Ambulatory Visit (INDEPENDENT_AMBULATORY_CARE_PROVIDER_SITE_OTHER): Payer: Medicare Other

## 2018-07-27 ENCOUNTER — Encounter (HOSPITAL_COMMUNITY): Payer: Self-pay

## 2018-07-27 DIAGNOSIS — F332 Major depressive disorder, recurrent severe without psychotic features: Secondary | ICD-10-CM | POA: Diagnosis not present

## 2018-07-27 NOTE — Progress Notes (Signed)
Patient reported to [facility] for Repetitive Transcranial Magnetic Stimulation treatment for severe episode of recurrent major depressive disorder, without psychotic features. Patient presented with appropriate affect, level mood and denied any suicidal or homicidal ideations. Patient denies any other current symptoms and remains optimistic with continued [place] tx. Patient reported no changed in alcohol/substance use, caffeine consumption, sleep pattern or metal implant status since previous tx. Patient arrived for tx by himself. Patient reported he was feeling good today. Patient reported no complaints or discomfort during the duration of tx. Patient watched television and engaged in conversation during tx. Patient departed post-treatment with no reported concerns.

## 2018-07-30 ENCOUNTER — Encounter (HOSPITAL_COMMUNITY): Payer: Self-pay

## 2018-07-31 ENCOUNTER — Other Ambulatory Visit (HOSPITAL_COMMUNITY): Payer: Medicare Other | Attending: Psychiatry | Admitting: Emergency Medicine

## 2018-07-31 DIAGNOSIS — F332 Major depressive disorder, recurrent severe without psychotic features: Secondary | ICD-10-CM | POA: Diagnosis not present

## 2018-07-31 NOTE — Progress Notes (Signed)
Patient reported to [facility] for Repetitive Transcranial Magnetic Stimulation treatment for severe episode of recurrent major depressive disorder, without psychotic features. Patient presented with appropriate affect, level mood and denied any suicidal or homicidal ideations. Patient denies any other current symptoms and remains optimistic with continued [place] treatment. Patient reported no change in alcohol/substane use, caffeine consumption, sleep pattern or metal implant status since previous treatment. Ptreported to tx session withpleasant affect.He spoke with Probation officer during tx session. He shared that he celebrated his 82th birthday with his family this weekend.Powerlevel remains at120%for the duration of the tx.Patient reported nocomplaints anddiscomfort during tx.Patientand wifedeparted post-treatment with no majorreported concerns.

## 2018-08-01 ENCOUNTER — Other Ambulatory Visit (INDEPENDENT_AMBULATORY_CARE_PROVIDER_SITE_OTHER): Payer: Medicare Other | Admitting: Emergency Medicine

## 2018-08-01 DIAGNOSIS — F332 Major depressive disorder, recurrent severe without psychotic features: Secondary | ICD-10-CM | POA: Diagnosis not present

## 2018-08-01 NOTE — Progress Notes (Signed)
Patient reported to [facility] for Repetitive Transcranial Magnetic Stimulation treatment for severe episode of recurrent major depressive disorder, without psychotic features. Patient presented with appropriate affect, level mood and denied any suicidal or homicidal ideations. Patient denies any other current symptoms and remains optimistic with continued [place] treatment. Patient reported no change in alcohol/substane use, caffeine consumption, sleep pattern or metal implant status since previous treatment. Ptreported to tx session withpleasant affect.He spoke with Probation officer during tx session.Powerlevel remains at120%for the duration of the tx.Patient reported nocomplaints anddiscomfort during tx.Patientand wifedeparted post-treatment with no majorreported concerns.

## 2018-08-02 ENCOUNTER — Other Ambulatory Visit (INDEPENDENT_AMBULATORY_CARE_PROVIDER_SITE_OTHER): Payer: Medicare Other | Admitting: Emergency Medicine

## 2018-08-02 DIAGNOSIS — F332 Major depressive disorder, recurrent severe without psychotic features: Secondary | ICD-10-CM

## 2018-08-02 NOTE — Progress Notes (Signed)
Patient reported to [facility] for Repetitive Transcranial Magnetic Stimulation treatment for severe episode of recurrent major depressive disorder, without psychotic features. Patient presented with appropriate affect, level mood and denied any suicidal or homicidal ideations. Patient denies any other current symptoms and remains optimistic with continued [place] treatment. Patient reported no change in alcohol/substane use, caffeine consumption, sleep pattern or metal implant status since previous treatment. Ptreported to tx session withpleasant affect.He spoke with Probation officer during tx session.Powerlevel remains at120%for the duration of the tx.Patient reported nocomplaints anddiscomfort during tx.Patientand wifedeparted post-treatment with no majorreported concerns.

## 2018-08-03 ENCOUNTER — Other Ambulatory Visit (INDEPENDENT_AMBULATORY_CARE_PROVIDER_SITE_OTHER): Payer: Medicare Other | Admitting: Emergency Medicine

## 2018-08-03 ENCOUNTER — Encounter (HOSPITAL_COMMUNITY): Payer: Self-pay

## 2018-08-03 DIAGNOSIS — F332 Major depressive disorder, recurrent severe without psychotic features: Secondary | ICD-10-CM | POA: Diagnosis not present

## 2018-08-03 NOTE — Progress Notes (Signed)
Patient reported to [facility] for Repetitive Transcranial Magnetic Stimulation treatment for severe episode of recurrent major depressive disorder, without psychotic features. Patient presented with appropriate affect, level mood and denied any suicidal or homicidal ideations. Patient denies any other current symptoms and remains optimistic with continued [place] treatment. Patient reported no change in alcohol/substane use, caffeine consumption, sleep pattern or metal implant status since previous treatment. Ptreported to tx session withpleasant affect.He spoke with Probation officer during tx session.Powerlevel remains at120%for the duration of the tx.Patient reported nocomplaints anddiscomfort during tx.Patientand wifedeparted post-treatment with no majorreported concerns.

## 2018-08-06 ENCOUNTER — Encounter (HOSPITAL_COMMUNITY): Payer: Self-pay

## 2018-08-07 ENCOUNTER — Other Ambulatory Visit (INDEPENDENT_AMBULATORY_CARE_PROVIDER_SITE_OTHER): Payer: Medicare Other | Admitting: Emergency Medicine

## 2018-08-07 DIAGNOSIS — F332 Major depressive disorder, recurrent severe without psychotic features: Secondary | ICD-10-CM

## 2018-08-07 NOTE — Progress Notes (Signed)
Patient reported to [facility] for Repetitive Transcranial Magnetic Stimulation treatment for severe episode of recurrent major depressive disorder, without psychotic features. Patient presented with appropriate affect, level mood and denied any suicidal or homicidal ideations. Patient denies any other current symptoms and remains optimistic with continued [place] treatment. Patient reported no change in alcohol/substane use, caffeine consumption, sleep pattern or metal implant status since previous treatment. Ptreported to tx session withpleasant affect.He spoke with Probation officer during tx session.Powerlevel remains at120%for the duration of the tx.Patient reported nocomplaints anddiscomfort during tx.Patientand wifedeparted post-treatment with no majorreported concerns.

## 2018-08-08 ENCOUNTER — Encounter (HOSPITAL_COMMUNITY): Payer: Self-pay

## 2018-08-09 ENCOUNTER — Encounter (HOSPITAL_COMMUNITY): Payer: Medicare Other

## 2018-08-09 ENCOUNTER — Other Ambulatory Visit (INDEPENDENT_AMBULATORY_CARE_PROVIDER_SITE_OTHER): Payer: Medicare Other | Admitting: Emergency Medicine

## 2018-08-09 DIAGNOSIS — F332 Major depressive disorder, recurrent severe without psychotic features: Secondary | ICD-10-CM | POA: Diagnosis not present

## 2018-08-09 NOTE — Progress Notes (Signed)
Patient reported to [facility] for Repetitive Transcranial Magnetic Stimulation treatment for severe episode of recurrent major depressive disorder, without psychotic features. Patient presented with appropriate affect, level mood and denied any suicidal or homicidal ideations. Patient denies any other current symptoms and remains optimistic with continued [place] treatment. Patient reported no change in alcohol/substane use, caffeine consumption, sleep pattern or metal implant status since previous treatment. Ptreported to tx session withpleasant affect.He spoke with Probation officer during tx session.Powerlevel remains at120%for the duration of the tx.Patient reported nocomplaints anddiscomfort during tx.Patientand wifedeparted post-treatment with no majorreported concerns.

## 2018-08-10 ENCOUNTER — Other Ambulatory Visit (INDEPENDENT_AMBULATORY_CARE_PROVIDER_SITE_OTHER): Payer: Medicare Other | Admitting: Emergency Medicine

## 2018-08-10 DIAGNOSIS — F332 Major depressive disorder, recurrent severe without psychotic features: Secondary | ICD-10-CM | POA: Diagnosis not present

## 2018-08-10 NOTE — Progress Notes (Signed)
Patient reported to [facility] for Repetitive Transcranial Magnetic Stimulation treatment for severe episode of recurrent major depressive disorder, without psychotic features. Patient presented with appropriate affect, level mood and denied any suicidal or homicidal ideations. Patient denies any other current symptoms and remains optimistic with continued [place] treatment. Patient reported no change in alcohol/substane use, caffeine consumption, sleep pattern or metal implant status since previous treatment. Ptreported to tx session withpleasant affect.He spoke with Probation officer during tx session.Powerlevel remains at120%for the duration of the tx.Patient reported nocomplaints anddiscomfort during tx.Patientand wifedeparted post-treatment with no majorreported concerns.

## 2018-08-13 ENCOUNTER — Encounter (HOSPITAL_COMMUNITY): Payer: Self-pay

## 2018-08-14 ENCOUNTER — Other Ambulatory Visit (INDEPENDENT_AMBULATORY_CARE_PROVIDER_SITE_OTHER): Payer: Medicare Other | Admitting: Emergency Medicine

## 2018-08-14 ENCOUNTER — Encounter (HOSPITAL_COMMUNITY): Payer: Self-pay

## 2018-08-14 DIAGNOSIS — F332 Major depressive disorder, recurrent severe without psychotic features: Secondary | ICD-10-CM | POA: Diagnosis not present

## 2018-08-14 NOTE — Progress Notes (Signed)
Patient reported to [facility] for Repetitive Transcranial Magnetic Stimulation treatment for severe episode of recurrent major depressive disorder, without psychotic features. Patient presented with appropriate affect, level mood and denied any suicidal or homicidal ideations. Patient denies any other current symptoms and remains optimistic with continued [place] treatment. Patient reported no change in alcohol/substane use, caffeine consumption, sleep pattern or metal implant status since previous treatment. Ptreported to tx session withpleasant affect.He spoke with Probation officer during tx session.Powerlevel remains at120%for the duration of the tx.Patient reported nocomplaints anddiscomfort during tx.Patientand wifedeparted post-treatment with no majorreported concerns.

## 2018-08-16 ENCOUNTER — Other Ambulatory Visit (INDEPENDENT_AMBULATORY_CARE_PROVIDER_SITE_OTHER): Payer: Medicare Other | Admitting: Emergency Medicine

## 2018-08-16 DIAGNOSIS — F332 Major depressive disorder, recurrent severe without psychotic features: Secondary | ICD-10-CM | POA: Diagnosis not present

## 2018-08-16 NOTE — Progress Notes (Signed)
Patient reported to [facility] for Repetitive Transcranial Magnetic Stimulation treatment for severe episode of recurrent major depressive disorder, without psychotic features. Patient presented with appropriate affect, level mood and denied any suicidal or homicidal ideations. Patient denies any other current symptoms and remains optimistic with continued [place] treatment. Patient reported no change in alcohol/substane use, caffeine consumption, sleep pattern or metal implant status since previous treatment. Ptreported to tx session withpleasant affect.He spoke with Probation officer during tx session. Powerlevel remains at120%for the duration of the tx.Patient reported nocomplaints anddiscomfort during tx.Patientand wifedeparted post-treatment with no majorreported concerns.

## 2018-08-17 ENCOUNTER — Other Ambulatory Visit (INDEPENDENT_AMBULATORY_CARE_PROVIDER_SITE_OTHER): Payer: Medicare Other | Admitting: Emergency Medicine

## 2018-08-17 DIAGNOSIS — F332 Major depressive disorder, recurrent severe without psychotic features: Secondary | ICD-10-CM | POA: Diagnosis not present

## 2018-08-17 NOTE — Progress Notes (Signed)
Patient reported to [facility] for Repetitive Transcranial Magnetic Stimulation treatment for severe episode of recurrent major depressive disorder, without psychotic features. Patient presented with appropriate affect, level mood and denied any suicidal or homicidal ideations. Patient denies any other current symptoms and remains optimistic with continued [place] treatment. Patient reported no change in alcohol/substane use, caffeine consumption, sleep pattern or metal implant status since previous treatment. Ptreported to tx session withpleasant affect.He spoke with Probation officer during tx session. He spent time playing a game on his phone. Powerlevel remains at120%for the duration of the tx.Patient reported nocomplaints anddiscomfort during tx.Patientand wifedeparted post-treatment with no majorreported concerns.

## 2018-08-20 ENCOUNTER — Encounter (HOSPITAL_COMMUNITY): Payer: Self-pay

## 2018-08-21 ENCOUNTER — Other Ambulatory Visit (INDEPENDENT_AMBULATORY_CARE_PROVIDER_SITE_OTHER): Payer: Medicare Other | Admitting: Emergency Medicine

## 2018-08-21 DIAGNOSIS — F332 Major depressive disorder, recurrent severe without psychotic features: Secondary | ICD-10-CM

## 2018-08-21 NOTE — Progress Notes (Signed)
Patient reported to [facility] for Repetitive Transcranial Magnetic Stimulation treatment for severe episode of recurrent major depressive disorder, without psychotic features. Patient presented with appropriate affect, level mood and denied any suicidal or homicidal ideations. Patient denies any other current symptoms and remains optimistic with continued [place] treatment. Patient reported no change in alcohol/substane use, caffeine consumption, sleep pattern or metal implant status since previous treatment. Ptreported to tx session withpleasant affect.He spoke with Probation officer during tx session. He shared that overall the [place] procedure has significantly improved his quality of life. He has started to play golf daily and socalize with friends and family. Powerlevel remains at120%for the duration of the tx.Patient reported nocomplaints anddiscomfort during tx.Patientand wifedeparted post-treatment with no majorreported concerns.

## 2018-08-31 ENCOUNTER — Ambulatory Visit: Payer: Self-pay | Admitting: Family Medicine

## 2018-08-31 DIAGNOSIS — Z0289 Encounter for other administrative examinations: Secondary | ICD-10-CM

## 2018-09-05 ENCOUNTER — Ambulatory Visit: Payer: Medicare Other | Admitting: Family Medicine

## 2018-09-05 ENCOUNTER — Encounter: Payer: Self-pay | Admitting: Family Medicine

## 2018-09-05 ENCOUNTER — Other Ambulatory Visit: Payer: Self-pay

## 2018-09-05 VITALS — BP 100/62 | HR 70 | Temp 98.0°F | Ht 67.0 in | Wt 140.0 lb

## 2018-09-05 DIAGNOSIS — K219 Gastro-esophageal reflux disease without esophagitis: Secondary | ICD-10-CM

## 2018-09-05 DIAGNOSIS — Z23 Encounter for immunization: Secondary | ICD-10-CM

## 2018-09-05 DIAGNOSIS — N400 Enlarged prostate without lower urinary tract symptoms: Secondary | ICD-10-CM | POA: Diagnosis not present

## 2018-09-05 DIAGNOSIS — D509 Iron deficiency anemia, unspecified: Secondary | ICD-10-CM

## 2018-09-05 NOTE — Progress Notes (Signed)
  Subjective:     Patient ID: [name], male   DOB: 1936-05-29, 82 y.o.   MRN: [id number]  HPI Patient seen for medical follow-up.  He has history of BPH, mild hyperlipidemia, GERD, prediabetes.   He has been on Hytrin 5 mg at night for several years and his BPH symptoms are stable.  He denies any orthostatic type symptoms.  His GERD is controlled with omeprazole.  He had recent mild anemia with low serum iron.  We had referred back to GI.  His Hemoccults were negative.  He was referred to hematology and had recent iron infusion and has follow-up labs here in December.  Feels better overall  History of depression.  He had tried 4 different anti-depression medications without good success.  He had [place] per psychiatry and just finished his last session last week and is doing much better overall.  Needs flu vaccine today  Past Medical History:  Diagnosis Date  . Anxiety   . BPH (benign prostatic hypertrophy)   . GERD (gastroesophageal reflux disease)   . Sleep apnea    Past Surgical History:  Procedure Laterality Date  . NASAL SEPTOPLASTY W/ TURBINOPLASTY  08/03/2012   Procedure: NASAL SEPTOPLASTY WITH TURBINATE REDUCTION;  Surgeon: Izora Gala, MD;  Location: [facility];  Service: ENT;  Laterality: Bilateral;    reports that he has quit smoking. He has never used smokeless tobacco. He reports that he drinks about 1.0 standard drinks of alcohol per week. He reports that he does not use drugs. family history includes Heart attack in his father; Hypertension in his daughter and father. No Known Allergies   Review of Systems  Constitutional: Negative for fatigue and unexpected weight change.  Eyes: Negative for visual disturbance.  Respiratory: Negative for cough, chest tightness and shortness of breath.   Cardiovascular: Negative for chest pain, palpitations and leg swelling.  Endocrine: Negative for polydipsia and polyuria.  Neurological: Negative for dizziness, syncope, weakness,  light-headedness and headaches.       Objective:   Physical Exam  Constitutional: He is oriented to person, place, and time. He appears well-developed and well-nourished.  HENT:  Right Ear: External ear normal.  Left Ear: External ear normal.  Mouth/Throat: Oropharynx is clear and moist.  Eyes: Pupils are equal, round, and reactive to light.  Neck: Neck supple. No thyromegaly present.  Cardiovascular: Normal rate and regular rhythm.  Pulmonary/Chest: Effort normal and breath sounds normal. No respiratory distress. He has no wheezes. He has no rales.  Musculoskeletal: He exhibits no edema.  Neurological: He is alert and oriented to person, place, and time.       Assessment:     #1 BPH symptomatically stable on Hytrin  #2 GERD stable on omeprazole  #3 iron deficiency anemia.  Presumably on the basis of poor absorption.  He was seen by gastroenterology and hematology.  Recent iron infusion as above    Plan:     -Influenza vaccine given -Continue close follow-up with hematology -Routine follow-up in 1 year and sooner as needed  [name] MD [facility]

## 2018-09-17 ENCOUNTER — Other Ambulatory Visit (HOSPITAL_COMMUNITY): Payer: Self-pay

## 2018-09-17 DIAGNOSIS — F411 Generalized anxiety disorder: Secondary | ICD-10-CM

## 2018-09-17 MED ORDER — ESCITALOPRAM OXALATE 20 MG PO TABS: 30.0000 mg | ORAL_TABLET | Freq: Every day | ORAL | 0 refills | Status: DC

## 2018-10-11 ENCOUNTER — Other Ambulatory Visit: Payer: Self-pay

## 2018-10-11 NOTE — Progress Notes (Signed)
Opened in error

## 2018-10-12 ENCOUNTER — Other Ambulatory Visit: Payer: Self-pay

## 2018-10-12 ENCOUNTER — Encounter (HOSPITAL_COMMUNITY): Payer: Self-pay | Admitting: Psychiatry

## 2018-10-12 ENCOUNTER — Ambulatory Visit (INDEPENDENT_AMBULATORY_CARE_PROVIDER_SITE_OTHER): Payer: Medicare Other | Admitting: Psychiatry

## 2018-10-12 DIAGNOSIS — F411 Generalized anxiety disorder: Secondary | ICD-10-CM

## 2018-10-12 DIAGNOSIS — D509 Iron deficiency anemia, unspecified: Secondary | ICD-10-CM

## 2018-10-12 MED ORDER — ESCITALOPRAM OXALATE 20 MG PO TABS: 30.0000 mg | ORAL_TABLET | Freq: Every day | ORAL | 2 refills | Status: DC

## 2018-10-12 NOTE — Progress Notes (Signed)
[place] MD/PA/NP OP Progress Note  10/12/2018 10:19 AM KYZEN HORN  MRN:  [id number]  Chief Complaint: I am doing better with [place].  HPI: Odis came for his follow-up appointment.  He had [place] and he finished 36 treatment in October.  He is doing much better with the [place].  It was recommended because he was not doing very well and despite taking Lexapro 30 mg he continued to have depression, social isolation, decreased energy, negative thoughts and no motivation to do things.  His elder brother also died in 06-21-23 due to brain tumor and he had to go back to [place] a few times.  He felt the [place] did help him a lot.  He started doing things that he used to in the past.  He went to play golf.  He is sleeping better.  He denies any negative thoughts or any crying spells.  He is no longer taking Ativan.  He still Lexapro 30 mg and tolerating very well.  He has no tremors shakes or any EPS.  He denies any mania, psychosis or any hallucination.  His energy level is improved.  Like to continue current dose of Lexapro.  Visit Diagnosis:    ICD-10-CM   1. GAD (generalized anxiety disorder) F41.1 escitalopram (LEXAPRO) 20 MG tablet    Past Psychiatric History: Reviewed. History of depression and anxiety most of his life. He had a good response with TMS and Lexapro. Tried Celexa, Pristiq and Cymbalta but limited outcome. PCP tried Klonopin but make him very groggy.  No history of suicidal attempt, mania, psychosis or any self abusive behavior. Saw Dr. Cheryln Manly in the past for therapy.  Past Medical History:  Past Medical History:  Diagnosis Date  . Anxiety   . BPH (benign prostatic hypertrophy)   . GERD (gastroesophageal reflux disease)   . Sleep apnea     Past Surgical History:  Procedure Laterality Date  . NASAL SEPTOPLASTY W/ TURBINOPLASTY  08/03/2012   Procedure: NASAL SEPTOPLASTY WITH TURBINATE REDUCTION;  Surgeon: Izora Gala, MD;  Location: [facility] OR;  Service: ENT;  Laterality: Bilateral;    Family  Psychiatric History: Reviewed.  Family History:  Family History  Problem Relation Age of Onset  . Heart attack Father   . Hypertension Father   . Hypertension Daughter     Social History:  Social History   Socioeconomic History  . Marital status: Married    Spouse name: Not on file  . Number of children: Not on file  . Years of education: Not on file  . Highest education level: Not on file  Occupational History  . Not on file  Social Needs  . Financial resource strain: Not on file  . Food insecurity:    Worry: Not on file    Inability: Not on file  . Transportation needs:    Medical: Not on file    Non-medical: Not on file  Tobacco Use  . Smoking status: Former Research scientist (life sciences)  . Smokeless tobacco: Never Used  . Tobacco comment: rare use over 50 years   Substance and Sexual Activity  . Alcohol use: Yes    Alcohol/week: 1.0 standard drinks    Types: 1 Shots of liquor per week    Comment: socially  . Drug use: No  . Sexual activity: Never    Birth control/protection: None  Lifestyle  . Physical activity:    Days per week: Not on file    Minutes per session: Not on file  . Stress: Not  on file  Relationships  . Social connections:    Talks on phone: Not on file    Gets together: Not on file    Attends religious service: Not on file    Active member of club or organization: Not on file    Attends meetings of clubs or organizations: Not on file    Relationship status: Not on file  Other Topics Concern  . Not on file  Social History Narrative   Lives in 2 level    Will stay for now    Just moved here approx 1.5 yo   Very nice neighbors    [employer]  x 5 with a very busy holiday   One son is a Teacher, music and nephew is a Investment banker, operational    Son in [place] moved to [place] and he misses his grand children    As 2 brothers in [place]         Allergies: No Known Allergies  Metabolic Disorder Labs: No results found for: HGBA1C, MPG No results found for: PROLACTIN Lab  Results  Component Value Date   CHOL 182 07/08/2016   TRIG 53.0 07/08/2016   HDL 66.70 07/08/2016   CHOLHDL 3 07/08/2016   VLDL 10.6 07/08/2016   LDLCALC 104 (H) 07/08/2016   LDLCALC 113 (H) 10/17/2013   Lab Results  Component Value Date   TSH 1.72 08/15/2017   TSH 2.96 07/08/2016    Therapeutic Level Labs: No results found for: LITHIUM No results found for: VALPROATE No components found for:  CBMZ  Current Medications: Current Outpatient Medications  Medication Sig Dispense Refill  . escitalopram (LEXAPRO) 20 MG tablet Take 1.5 tablets (30 mg total) by mouth daily. 45 tablet 2  . hydrocortisone 2.5 % cream Apply topically 2 (two) times daily as needed. 30 g 1  . omeprazole (PRILOSEC) 40 MG capsule TAKE 1 CAPSULE BY MOUTH  DAILY 90 capsule 2  . RA VITAMIN B12 2000 MCG TBCR take 1 tablet by mouth daily 30 tablet 5  . terazosin (HYTRIN) 5 MG capsule TAKE 1 CAPSULE BY MOUTH  DAILY 90 capsule 1  . LORazepam (ATIVAN) 0.5 MG tablet Take 1 tablet (0.5 mg total) by mouth daily as needed for anxiety. (Patient not taking: Reported on 10/12/2018) 15 tablet 0   No current facility-administered medications for this visit.      Musculoskeletal: Strength & Muscle Tone: within normal limits Gait & Station: normal Patient leans: N/A  Psychiatric Specialty Exam: ROS  Blood pressure 110/68, height 5\' 7"  (1.702 m), weight 143 lb (64.9 kg).Body mass index is 22.4 kg/m.  General Appearance: Casual  Eye Contact:  Good  Speech:  Clear and Coherent  Volume:  Normal  Mood:  Euthymic  Affect:  Appropriate  Thought Process:  Goal Directed  Orientation:  Full (Time, Place, and Person)  Thought Content: Logical   Suicidal Thoughts:  No  Homicidal Thoughts:  No  Memory:  Immediate;   Good Recent;   Good Remote;   Good  Judgement:  Good  Insight:  Good  Psychomotor Activity:  Normal  Concentration:  Concentration: Good and Attention Span: Good  Recall:  Good  Fund of Knowledge: Good   Language: Good  Akathisia:  No  Handed:  Right  AIMS (if indicated): not done  Assets:  Communication Skills Desire for Improvement Housing Resilience Social Support  ADL's:  Intact  Cognition: WNL  Sleep:  Good   Screenings: PHQ2-9     Office Visit from  09/05/2018 in [place] at [facility] from 02/06/2017 in [place] at [facility] from 06/17/2016 in [place] at [facility] from 09/08/2015 in [place] at [facility] from 08/29/2014 in [place] at [facility] Total Score  0  1  0  0  0  PHQ-9 Total Score  0  -  -  -  -       Assessment and Plan: Major depressive disorder, recurrent.  Generalized anxiety disorder.  Patient doing better after [place].  He feels that his energy is back and he start playing golf which she has never done recently.  He is no longer taking Ativan.  I will continue Lexapro 30 mg daily.  He is not interested in therapy.  I recommended if he started to feel that his symptoms are coming back then he should call us immediately.  Discussed medication side effects and benefits.  Recommended to make follow-up in 3 months unless needed earlier.   Kathlee Nations, MD 10/12/2018, 10:19 AM

## 2018-10-15 ENCOUNTER — Telehealth: Payer: Self-pay | Admitting: Hematology and Oncology

## 2018-10-15 ENCOUNTER — Inpatient Hospital Stay (HOSPITAL_BASED_OUTPATIENT_CLINIC_OR_DEPARTMENT_OTHER): Payer: Medicare Other | Admitting: Hematology and Oncology

## 2018-10-15 ENCOUNTER — Inpatient Hospital Stay: Payer: Medicare Other | Attending: Hematology and Oncology

## 2018-10-15 DIAGNOSIS — D509 Iron deficiency anemia, unspecified: Secondary | ICD-10-CM | POA: Diagnosis not present

## 2018-10-15 DIAGNOSIS — Z79899 Other long term (current) drug therapy: Secondary | ICD-10-CM

## 2018-10-15 LAB — CBC WITH DIFFERENTIAL (CANCER CENTER ONLY)
ABS IMMATURE GRANULOCYTES: 0.01 10*3/uL (ref 0.00–0.07)
Basophils Absolute: 0 10*3/uL (ref 0.0–0.1)
Basophils Relative: 1 %
Eosinophils Absolute: 0.2 10*3/uL (ref 0.0–0.5)
Eosinophils Relative: 4 %
HEMATOCRIT: 35.9 % — AB (ref 39.0–52.0)
HEMOGLOBIN: 11.7 g/dL — AB (ref 13.0–17.0)
Immature Granulocytes: 0 %
LYMPHS ABS: 1.1 10*3/uL (ref 0.7–4.0)
LYMPHS PCT: 24 %
MCH: 26.4 pg (ref 26.0–34.0)
MCHC: 32.6 g/dL (ref 30.0–36.0)
MCV: 80.9 fL (ref 80.0–100.0)
MONO ABS: 0.7 10*3/uL (ref 0.1–1.0)
MONOS PCT: 15 %
NEUTROS ABS: 2.7 10*3/uL (ref 1.7–7.7)
Neutrophils Relative %: 56 %
Platelet Count: 175 10*3/uL (ref 150–400)
RBC: 4.44 MIL/uL (ref 4.22–5.81)
RDW: 14.5 % (ref 11.5–15.5)
WBC Count: 4.8 10*3/uL (ref 4.0–10.5)
nRBC: 0 % (ref 0.0–0.2)

## 2018-10-15 LAB — IRON AND TIBC
IRON: 85 ug/dL (ref 42–163)
Saturation Ratios: 36 % (ref 20–55)
TIBC: 240 ug/dL (ref 202–409)
UIBC: 155 ug/dL (ref 117–376)

## 2018-10-15 LAB — FERRITIN: FERRITIN: 245 ng/mL (ref 24–336)

## 2018-10-15 NOTE — Telephone Encounter (Signed)
I informed the patient the iron studies were normal.

## 2018-10-15 NOTE — Assessment & Plan Note (Signed)
Patient has had upper endoscopies and colonoscopies and did not have any clear identified source of blood loss.  However malabsorption is also a possibility. Patient is intolerant of oral iron therapy. Prior treatment: IV iron therapy: 07/13/2018 2 doses  Lab review:  Return to clinic in 3 months with labs and follow-up

## 2018-10-15 NOTE — Telephone Encounter (Signed)
Gave avs and calendar ° °

## 2018-10-15 NOTE — Progress Notes (Signed)
Patient Care Team: [name], MD as PCP - General (Family Medicine)  DIAGNOSIS:  Encounter Diagnosis  Name Primary?  . Iron deficiency anemia, unspecified iron deficiency anemia type     CHIEF COMPLIANT: Follow-up of iron deficiency anemia  INTERVAL HISTORY: [name] is a 82 year old with above-mentioned staff iron deficiency anemia for which we have given him IV iron therapy 3 months ago and is here today to recheck his blood work.  He feels that the iron has helped him with improvement in his energy levels.  He is now able to play a full 18 rounds of golf without any problems.  He denies any shortness of breath lightheadedness dizziness or palpitations. He recently completed a treatment for depression and is remarkable how well he feels.  REVIEW OF SYSTEMS:   Constitutional: Denies fevers, chills or abnormal weight loss Eyes: Denies blurriness of vision Ears, nose, mouth, throat, and face: Denies mucositis or sore throat Respiratory: Denies cough, dyspnea or wheezes Cardiovascular: Denies palpitation, chest discomfort Gastrointestinal:  Denies nausea, heartburn or change in bowel habits Skin: Denies abnormal skin rashes Lymphatics: Denies new lymphadenopathy or easy bruising Neurological:Denies numbness, tingling or new weaknesses Behavioral/Psych: Mood is stable, no new changes  Extremities: No lower extremity edema  All other systems were reviewed with the patient and are negative.  I have reviewed the past medical history, past surgical history, social history and family history with the patient and they are unchanged from previous note.  ALLERGIES:  has No Known Allergies.  MEDICATIONS:  Current Outpatient Medications  Medication Sig Dispense Refill  . escitalopram (LEXAPRO) 20 MG tablet Take 1.5 tablets (30 mg total) by mouth daily. 45 tablet 2  . hydrocortisone 2.5 % cream Apply topically 2 (two) times daily as needed. 30 g 1  . LORazepam (ATIVAN) 0.5  MG tablet Take 1 tablet (0.5 mg total) by mouth daily as needed for anxiety. (Patient not taking: Reported on 10/12/2018) 15 tablet 0  . omeprazole (PRILOSEC) 40 MG capsule TAKE 1 CAPSULE BY MOUTH  DAILY 90 capsule 2  . RA VITAMIN B12 2000 MCG TBCR take 1 tablet by mouth daily 30 tablet 5  . terazosin (HYTRIN) 5 MG capsule TAKE 1 CAPSULE BY MOUTH  DAILY 90 capsule 1   No current facility-administered medications for this visit.     PHYSICAL EXAMINATION: ECOG PERFORMANCE STATUS: 0 - Asymptomatic  Vitals:   10/15/18 1154  BP: 140/65  Pulse: 62  Resp: 17  Temp: 98.4 F (36.9 C)  SpO2: 99%   Filed Weights   10/15/18 1154  Weight: 143 lb (64.9 kg)    GENERAL:alert, no distress and comfortable SKIN: skin color, texture, turgor are normal, no rashes or significant lesions EYES: normal, Conjunctiva are pink and non-injected, sclera clear OROPHARYNX:no exudate, no erythema and lips, buccal mucosa, and tongue normal  NECK: supple, thyroid normal size, non-tender, without nodularity LYMPH:  no palpable lymphadenopathy in the cervical, axillary or inguinal LUNGS: clear to auscultation and percussion with normal breathing effort HEART: regular rate & rhythm and no murmurs and no lower extremity edema ABDOMEN:abdomen soft, non-tender and normal bowel sounds MUSCULOSKELETAL:no cyanosis of digits and no clubbing  NEURO: alert & oriented x 3 with fluent speech, no focal motor/sensory deficits EXTREMITIES: No lower extremity edema   LABORATORY DATA:  I have reviewed the data as listed CMP Latest Ref Rng & Units 10/13/2017 08/15/2017 07/08/2016  Glucose 65 - 99 mg/dL 109(H) 123(H) 98  BUN 7 - 25  mg/dL 21 22 20   Creatinine 0.70 - 1.18 mg/dL 0.89 0.82 0.83  Sodium 135 - 146 mmol/L 138 133(L) 133(L)  Potassium 3.5 - 5.3 mmol/L 4.9 4.1 4.4  Chloride 98 - 110 mmol/L 103 100 99  CO2 20 - 32 mmol/L 25 26 26   Calcium 8.6 - 10.3 mg/dL 8.7 8.8 8.4  Total Protein 6.1 - 8.1 g/dL 6.3 - 6.3  Total  Bilirubin 0.2 - 1.2 mg/dL 0.3 - 0.5  Alkaline Phos 39 - 117 U/L - - 46  AST 10 - 35 U/L 13 - 13  ALT 9 - 46 U/L 15 - 13    Lab Results  Component Value Date   WBC 4.8 10/15/2018   HGB 11.7 (L) 10/15/2018   HCT 35.9 (L) 10/15/2018   MCV 80.9 10/15/2018   PLT 175 10/15/2018   NEUTROABS 2.7 10/15/2018    ASSESSMENT & PLAN:  Iron deficiency anemia Patient has had upper endoscopies and colonoscopies and did not have any clear identified source of blood loss.  However malabsorption is also a possibility. Patient is intolerant of oral iron therapy. Prior treatment: IV iron therapy: 07/13/2018 2 doses  Lab review: Hemoglobin 11.7, MCV 80.9 Iron studies are pending. Return to clinic in 3 months with labs and follow-up    Orders Placed This Encounter  Procedures  . Ferritin    Standing Status:   Future    Standing Expiration Date:   10/15/2019  . Iron and TIBC    Standing Status:   Future    Standing Expiration Date:   10/15/2019  . CBC with Differential (Cancer Center Only)    Standing Status:   Future    Standing Expiration Date:   10/16/2019   The patient has a good understanding of the overall plan. he agrees with it. he will call with any problems that may develop before the next visit here.   Harriette Ohara, MD 10/15/18

## 2018-10-29 ENCOUNTER — Telehealth (HOSPITAL_COMMUNITY): Payer: Self-pay

## 2018-10-29 DIAGNOSIS — F419 Anxiety disorder, unspecified: Secondary | ICD-10-CM

## 2018-10-29 MED ORDER — LORAZEPAM 0.5 MG PO TABS: 0.5000 mg | ORAL_TABLET | Freq: Every day | ORAL | 0 refills | Status: DC | PRN

## 2018-10-29 NOTE — Telephone Encounter (Signed)
Refill done.  Please call patient to pick up the medication.

## 2018-10-29 NOTE — Telephone Encounter (Signed)
Patient is calling for a refill on Ativan, patient does not take often and has not needed a refill since July. Patient is still using [facility] on [address]

## 2018-11-06 ENCOUNTER — Ambulatory Visit: Payer: Self-pay

## 2018-11-09 NOTE — Progress Notes (Addendum)
Subjective:   [name] is a 83 y.o. male who presents for Medicare Annual/Subsequent preventive examination.  Review of Systems: No ROS.  Medicare Wellness Visit. Additional risk factors are reflected in the social history.  Cardiac Risk Factors include: advanced age (>31men, >25 women);dyslipidemia;male gender Sleep patterns: no sleep issues and gets up 2 times nightly to void. Feels well rested. Home Safety/Smoke Alarms: Feels safe in home. Smoke alarms in place.   Living environment; residence and Firearm Safety: 1-story house/ trailer, no difficulty ambulating, no need for DME. Seat Belt Safety/Bike Helmet: Wears seat belt.   Male:   CCS- N/A as patient is over 80yo PSA-  Lab Results  Component Value Date   PSA 2.79 07/08/2016   PSA 2.17 10/20/2011   PSA 2.15 03/12/2010       Objective:    Vitals: BP (!) 102/52 (BP Location: Right Arm, Patient Position: Sitting, Cuff Size: Normal)   Pulse 74   Resp 16   Ht 5\' 7"  (1.702 m)   Wt 140 lb (63.5 kg)   SpO2 98%   BMI 21.93 kg/m   Body mass index is 21.93 kg/m.  Advanced Directives 11/12/2018 11/03/2017 06/17/2016 07/27/2012  Does Patient Have a Medical Advance Directive? Yes Yes Yes Patient has advance directive, copy not in chart  Type of Advance Directive [place];Living will - - Living will  Does patient want to make changes to medical advance directive? Yes (MAU/Ambulatory/Procedural Areas - Information given) - - -  Copy of [place] in Chart? No - copy requested - No - copy requested Copy requested from family    Tobacco Social History   Tobacco Use  Smoking Status Former Smoker  Smokeless Tobacco Never Used  Tobacco Comment   rare use over 50 years      Counseling given: Not Answered Comment: rare use over 50 years    Past Medical History:  Diagnosis Date  . Anxiety   . BPH (benign prostatic hypertrophy)   . GERD (gastroesophageal reflux disease)   . Sleep  apnea    Past Surgical History:  Procedure Laterality Date  . NASAL SEPTOPLASTY W/ TURBINOPLASTY  08/03/2012   Procedure: NASAL SEPTOPLASTY WITH TURBINATE REDUCTION;  Surgeon: Izora Gala, MD;  Location: [facility] OR;  Service: ENT;  Laterality: Bilateral;   Family History  Problem Relation Age of Onset  . Heart attack Father   . Hypertension Father   . Hypertension Daughter    Social History   Socioeconomic History  . Marital status: Married    Spouse name: Not on file  . Number of children: Not on file  . Years of education: Not on file  . Highest education level: Not on file  Occupational History  . Not on file  Social Needs  . Financial resource strain: Not hard at all  . Food insecurity:    Worry: Never true    Inability: Never true  . Transportation needs:    Medical: No    Non-medical: No  Tobacco Use  . Smoking status: Former Research scientist (life sciences)  . Smokeless tobacco: Never Used  . Tobacco comment: rare use over 50 years   Substance and Sexual Activity  . Alcohol use: Yes    Alcohol/week: 1.0 standard drinks    Types: 1 Shots of liquor per week    Comment: socially  . Drug use: No  . Sexual activity: Never    Birth control/protection: None  Lifestyle  . Physical activity:  Days per week: 0 days    Minutes per session: 0 min  . Stress: Not at all  Relationships  . Social connections:    Talks on phone: Twice a week    Gets together: Once a week    Attends religious service: Never    Active member of club or organization: Yes    Attends meetings of clubs or organizations: More than 4 times per year    Relationship status: Married  Other Topics Concern  . Not on file  Social History Narrative   Lives in 2 level    Will stay for now    Just moved here approx 1.5 yo   Very nice neighbors    [employer]  x 5 with a very busy holiday   One son is a Teacher, music and nephew is a Investment banker, operational    Son in [place] moved to [place] and he misses his grand children    As 2  brothers in [place]       Retired from [employer], [employer]        Outpatient Encounter Medications as of 11/12/2018  Medication Sig  . escitalopram (LEXAPRO) 20 MG tablet Take 1.5 tablets (30 mg total) by mouth daily.  . hydrocortisone 2.5 % cream Apply topically 2 (two) times daily as needed.  Marland Kitchen LORazepam (ATIVAN) 0.5 MG tablet Take 1 tablet (0.5 mg total) by mouth daily as needed for anxiety.  Marland Kitchen omeprazole (PRILOSEC) 40 MG capsule TAKE 1 CAPSULE BY MOUTH  DAILY  . RA VITAMIN B12 2000 MCG TBCR take 1 tablet by mouth daily  . terazosin (HYTRIN) 5 MG capsule TAKE 1 CAPSULE BY MOUTH  DAILY   No facility-administered encounter medications on file as of 11/12/2018.     Activities of Daily Living In your present state of health, do you have any difficulty performing the following activities: 11/12/2018  Hearing? N  Vision? N  Difficulty concentrating or making decisions? N  Walking or climbing stairs? N  Dressing or bathing? N  Doing errands, shopping? N  Preparing Food and eating ? N  Using the Toilet? N  In the past six months, have you accidently leaked urine? N  Do you have problems with loss of bowel control? N  Managing your Medications? N  Managing your Finances? N  Housekeeping or managing your Housekeeping? N  Some recent data might be hidden    Patient Care Team: [name], MD as PCP - General (Family Medicine) Juanita Craver, MD as Consulting Physician (Gastroenterology) Calvert Cantor, MD as Consulting Physician (Ophthalmology) Adele Schilder Arlyce Harman, MD as Consulting Physician (Psychiatry) Nicholas Lose, MD as Consulting Physician (Hematology and Oncology)   Assessment:   This is a routine wellness examination for [name]. Physical assessment deferred to PCP.   Exercise Activities and Dietary recommendations Current Exercise Habits: Structured exercise class, Type of exercise: calisthenics;strength training/weights, Time (Minutes): 30, Frequency (Times/Week): 4, Weekly Exercise  (Minutes/Week): 120, Intensity: Moderate, Exercise limited by: psychological condition(s) Diet (meal preparation, eat out, water intake, caffeinated beverages, dairy products, fruits and vegetables): in general, a "healthy" diet  , on average, 3 meals per day. Healthy weight gain options reviewed and provided to patient.   Goals    . Exercise 150 min/wk Moderate Activity     Start playing golf and getting back to your routine     . patient     More engaged in social event  More exercise / more outings    . Patient Stated  Increase weight to goal of 145lbs       Fall Risk Fall Risk  11/12/2018 11/03/2017 02/06/2017 06/17/2016 09/08/2015  Falls in the past year? 0 No No No No   Depression Screen PHQ 2/9 Scores 11/12/2018 09/05/2018 02/06/2017 06/17/2016  PHQ - 2 Score 0 0 1 0  PHQ- 9 Score 1 0 - -    Cognitive Function MMSE - Mini Mental State Exam 11/03/2017 11/03/2017 06/17/2016  Not completed: Refused (No Data) (No Data)       Ad8 score reviewed for issues:  Issues making decisions: no  Less interest in hobbies / activities: no  Repeats questions, stories (family complaining): no  Trouble using ordinary gadgets (microwave, computer, phone):no  Forgets the month or year: no  Mismanaging finances: no  Remembering appts: no  Daily problems with thinking and/or memory: no Ad8 score is= 0    Immunization History  Administered Date(s) Administered  . Influenza Split 12/10/2011, 07/09/2012  . Influenza Whole 08/11/2008, 07/31/2009  . Influenza, High Dose Seasonal PF 07/31/2013, 08/24/2014, 07/10/2015, 07/15/2016, 08/15/2017, 09/05/2018  . Pneumococcal Conjugate-13 08/29/2014  . Pneumococcal Polysaccharide-23 01/21/2005  . Td 04/10/2003, 10/14/2013  . Zoster 11/01/2011    Qualifies for Shingles Vaccine? Yes, encouraged to receive at local pharmacy.  Screening Tests Health Maintenance  Topic Date Due  . TETANUS/TDAP  10/15/2023  . INFLUENZA VACCINE  Completed  . PNA  vac Low Risk Adult  Completed        Plan:    Continue going to the [facility] to stay physically active and mentally well.  Continue to follow up with psychiatry for anxiety.   Continue doing brain stimulating activities (puzzles, reading, adult coloring books, staying active) to keep memory sharp, keep reading!   Maintain your routine, and find opportunities for heart-healthy eating while still reaching your weight goal since recent loss of weight.  I have personally reviewed and noted the following in the patient's chart:   . Medical and social history . Use of alcohol, tobacco or illicit drugs  . Current medications and supplements . Functional ability and status . Nutritional status . Physical activity . Advanced directives . List of other physicians . Vitals . Screenings to include cognitive, depression, and falls . Referrals and appointments  In addition, I have reviewed and discussed with patient certain preventive protocols, quality metrics, and best practice recommendations. A written personalized care plan for preventive services as well as general preventive health recommendations were provided to patient.     Alphia Moh, RN  11/12/2018  I have reviewed the documentation for the [facility] and [place] provided by the health coach and agree with their documentation. I was immediately available for any questions  [name] MD [facility]

## 2018-11-12 ENCOUNTER — Ambulatory Visit (INDEPENDENT_AMBULATORY_CARE_PROVIDER_SITE_OTHER): Payer: Medicare Other

## 2018-11-12 VITALS — BP 102/52 | HR 74 | Resp 16 | Ht 67.0 in | Wt 140.0 lb

## 2018-11-12 DIAGNOSIS — Z0000 Encounter for general adult medical examination without abnormal findings: Secondary | ICD-10-CM

## 2018-11-12 NOTE — Patient Instructions (Addendum)
Continue going to the [employer] to stay physically active and mentally well.  Continue to follow up with psychiatry for anxiety.   Continue doing brain stimulating activities (puzzles, reading, adult coloring books, staying active) to keep memory sharp, keep reading!   Maintain your routine, and find opportunities for heart-healthy eating while still reaching your weight goal since recent loss of weight.   High-Protein and High-Calorie Diet Eating high-protein and high-calorie foods can help you to gain weight, heal after an injury, and recover after an illness or surgery. The specific amount of daily protein and calories you need depends on:  Your body weight.  The reason this diet is recommended for you. What is my plan? Generally, a high-protein, high-calorie diet involves:  Eating 250-500 extra calories each day.  Making sure that you get enough of your daily calories from protein. Ask your health care provider how many of your calories should come from protein. Talk with a health care provider, such as a diet and nutrition specialist (dietitian), about how much protein and how many calories you need each day. Follow the diet as directed by your health care provider. What are tips for following this plan?  Preparing meals  Add whole milk, half-and-half, or heavy cream to cereal, pudding, soup, or hot cocoa.  Add whole milk to instant breakfast drinks.  Add peanut butter to oatmeal or smoothies.  Add powdered milk to baked goods, smoothies, or milkshakes.  Add powdered milk, cream, or butter to mashed potatoes.  Add cheese to cooked vegetables.  Make whole-milk yogurt parfaits. Top them with granola, fruit, or nuts.  Add cottage cheese to your fruit.  Add avocado, cheese, or both to sandwiches or salads.  Add meat, poultry, or seafood to rice, pasta, casseroles, salads, and soups.  Use mayonnaise when making egg salad, chicken salad, or tuna salad.  Use peanut butter as  a dip for vegetables or as a topping for pretzels, celery, or crackers.  Add beans to casseroles, dips, and spreads.  Add pureed beans to sauces and soups.  Replace calorie-free drinks with calorie-containing drinks, such as milk and fruit juice.  Replace water with milk or heavy cream when making foods such as oatmeal, pudding, or cocoa. General instructions  Ask your health care provider if you should take a nutritional supplement.  Try to eat six small meals each day instead of three large meals.  Eat a balanced diet. In each meal, include one food that is high in protein.  Keep nutritious snacks available, such as nuts, trail mixes, dried fruit, and yogurt.  If you have kidney disease or diabetes, talk with your health care provider about how much protein is safe for you. Too much protein may put extra stress on your kidneys.  Drink your calories. Choose high-calorie drinks and have them after your meals. What high-protein foods should I eat?  Vegetables Soybeans. Peas. Grains Quinoa. Bulgur wheat. Meats and other proteins Beef, pork, and poultry. Fish and seafood. Eggs. Tofu. Textured vegetable protein (TVP). Peanut butter. Nuts and seeds. Dried beans. Protein powders. Dairy Whole milk. Whole-milk yogurt. Powdered milk. Cheese. Yahoo. Eggnog. Beverages High-protein supplement drinks. Soy milk. Other foods Protein bars. The items listed above may not be a complete list of high-protein foods and beverages. Contact a dietitian for more options. What high-calorie foods should I eat? Fruits Dried fruit. Fruit leather. Canned fruit in syrup. Fruit juice. Avocado. Vegetables Vegetables cooked in oil or butter. Fried potatoes. Grains Pasta. Quick breads. Muffins.  Pancakes. Ready-to-eat cereal. Meats and other proteins Peanut butter. Nuts and seeds. Dairy Heavy cream. Whipped cream. Cream cheese. Sour cream. Ice cream. Custard. Pudding. Beverages Meal-replacement  beverages. Nutrition shakes. Fruit juice. Sugar-sweetened soft drinks. Seasonings and condiments Salad dressing. Mayonnaise. Alfredo sauce. Fruit preserves or jelly. Honey. Syrup. Sweets and desserts Cake. Cookies. Pie. Pastries. Candy bars. Chocolate. Fats and oils Butter or margarine. Oil. Gravy. Other foods Meal-replacement bars. The items listed above may not be a complete list of high-calorie foods and beverages. Contact a dietitian for more options. Summary  A high-protein, high-calorie diet can help you gain weight or heal faster after an injury, illness, or surgery.  To increase your protein and calories, add ingredients such as whole milk, peanut butter, cheese, beans, meat, or seafood to meal items.  To get enough extra calories each day, include high-calorie foods and beverages at each meal.  Adding a high-calorie drink or shake can be an easy way to help you get enough calories each day. Talk with your healthcare provider or dietitian about the best options for you. This information is not intended to replace advice given to you by your health care provider. Make sure you discuss any questions you have with your health care provider. Document Released: 10/17/2005 Document Revised: 08/29/2017 Document Reviewed: 08/29/2017 Elsevier Interactive Patient Education  2019 [employer].     [name] , Thank you for taking time to come for your Medicare Wellness Visit. I appreciate your ongoing commitment to your health goals. Please review the following plan we discussed and let me know if I can assist you in the future.   These are the goals we discussed: Goals    . Exercise 150 min/wk Moderate Activity     Start playing golf and getting back to your routine     . patient     More engaged in social event  More exercise / more outings    . Patient Stated     Increase weight to goal of 145lbs       This is a list of the screening recommended for you and due dates:    Health Maintenance  Topic Date Due  . Tetanus Vaccine  10/15/2023  . Flu Shot  Completed  . Pneumonia vaccines  Completed     Health Maintenance, Male A healthy lifestyle and preventive care is important for your health and wellness. Ask your health care provider about what schedule of regular examinations is right for you. What should I know about weight and diet? Eat a Healthy Diet  Eat plenty of vegetables, fruits, whole grains, low-fat dairy products, and lean protein.  Do not eat a lot of foods high in solid fats, added sugars, or salt.  Maintain a Healthy Weight Regular exercise can help you achieve or maintain a healthy weight. You should:  Do at least 150 minutes of exercise each week. The exercise should increase your heart rate and make you sweat (moderate-intensity exercise).  Do strength-training exercises at least twice a week. Watch Your Levels of Cholesterol and Blood Lipids  Have your blood tested for lipids and cholesterol every 5 years starting at 81 years of age. If you are at high risk for heart disease, you should start having your blood tested when you are 81 years old. You may need to have your cholesterol levels checked more often if: ? Your lipid or cholesterol levels are high. ? You are older than 81 years of age. ? You are at  high risk for heart disease. What should I know about cancer screening? Many types of cancers can be detected early and may often be prevented. Lung Cancer  You should be screened every year for lung cancer if: ? You are a current smoker who has smoked for at least 30 years. ? You are a former smoker who has quit within the past 15 years.  Talk to your health care provider about your screening options, when you should start screening, and how often you should be screened. Colorectal Cancer  Routine colorectal cancer screening usually begins at 81 years of age and should be repeated every 5-10 years until you are 81 years old.  You may need to be screened more often if early forms of precancerous polyps or small growths are found. Your health care provider may recommend screening at an earlier age if you have risk factors for colon cancer.  Your health care provider may recommend using home test kits to check for hidden blood in the stool.  A small camera at the end of a tube can be used to examine your colon (sigmoidoscopy or colonoscopy). This checks for the earliest forms of colorectal cancer. Prostate and Testicular Cancer  Depending on your age and overall health, your health care provider may do certain tests to screen for prostate and testicular cancer.  Talk to your health care provider about any symptoms or concerns you have about testicular or prostate cancer. Skin Cancer  Check your skin from head to toe regularly.  Tell your health care provider about any new moles or changes in moles, especially if: ? There is a change in a mole's size, shape, or color. ? You have a mole that is larger than a pencil eraser.  Always use sunscreen. Apply sunscreen liberally and repeat throughout the day.  Protect yourself by wearing long sleeves, pants, a wide-brimmed hat, and sunglasses when outside. What should I know about heart disease, diabetes, and high blood pressure?  If you are 32-61 years of age, have your blood pressure checked every 3-5 years. If you are 31 years of age or older, have your blood pressure checked every year. You should have your blood pressure measured twice-once when you are at a hospital or clinic, and once when you are not at a hospital or clinic. Record the average of the two measurements. To check your blood pressure when you are not at a hospital or clinic, you can use: ? An automated blood pressure machine at a pharmacy. ? A home blood pressure monitor.  Talk to your health care provider about your target blood pressure.  If you are between 77-88 years old, ask your health care  provider if you should take aspirin to prevent heart disease.  Have regular diabetes screenings by checking your fasting blood sugar level. ? If you are at a normal weight and have a low risk for diabetes, have this test once every three years after the age of 55. ? If you are overweight and have a high risk for diabetes, consider being tested at a younger age or more often.  A one-time screening for abdominal aortic aneurysm (AAA) by ultrasound is recommended for men aged 26-75 years who are current or former smokers. What should I know about preventing infection? Hepatitis B If you have a higher risk for hepatitis B, you should be screened for this virus. Talk with your health care provider to find out if you are at risk for hepatitis B infection.  Hepatitis C Blood testing is recommended for:  Everyone born from [birth year] through 1963.  Anyone with known risk factors for hepatitis C. Sexually Transmitted Diseases (STDs)  You should be screened each year for STDs including gonorrhea and chlamydia if: ? You are sexually active and are younger than 81 years of age. ? You are older than 81 years of age and your health care provider tells you that you are at risk for this type of infection. ? Your sexual activity has changed since you were last screened and you are at an increased risk for chlamydia or gonorrhea. Ask your health care provider if you are at risk.  Talk with your health care provider about whether you are at high risk of being infected with HIV. Your health care provider may recommend a prescription medicine to help prevent HIV infection. What else can I do?  Schedule regular health, dental, and eye exams.  Stay current with your vaccines (immunizations).  Do not use any tobacco products, such as cigarettes, chewing tobacco, and e-cigarettes. If you need help quitting, ask your health care provider.  Limit alcohol intake to no more than 2 drinks per day. One drink equals 12  ounces of beer, 5 ounces of wine, or 1 ounces of hard liquor.  Do not use street drugs.  Do not share needles.  Ask your health care provider for help if you need support or information about quitting drugs.  Tell your health care provider if you often feel depressed.  Tell your health care provider if you have ever been abused or do not feel safe at home. This information is not intended to replace advice given to you by your health care provider. Make sure you discuss any questions you have with your health care provider. Document Released: 04/14/2008 Document Revised: 06/15/2016 Document Reviewed: 07/21/2015 Elsevier Interactive Patient Education  2019 [employer].

## 2018-11-13 ENCOUNTER — Telehealth (HOSPITAL_COMMUNITY): Payer: Self-pay

## 2018-11-13 DIAGNOSIS — F419 Anxiety disorder, unspecified: Secondary | ICD-10-CM

## 2018-11-13 MED ORDER — LORAZEPAM 0.5 MG PO TABS: 0.5000 mg | ORAL_TABLET | Freq: Every day | ORAL | 0 refills | Status: DC | PRN

## 2018-11-13 NOTE — Telephone Encounter (Signed)
Patient is calling for another refill on his Ativan, he was given a 15 day supply on 12/30. Please review and advise, thank you

## 2018-11-13 NOTE — Telephone Encounter (Signed)
Called patient back and advised him to only take the Ativan when having severe anxiety. Patient voiced his understanding.

## 2018-11-13 NOTE — Telephone Encounter (Signed)
Prescription called for Ativan 0.5 mg.  Please inform patient to take only as needed for severe anxiety episodes.

## 2018-12-17 DIAGNOSIS — R339 Retention of urine, unspecified: Secondary | ICD-10-CM | POA: Diagnosis not present

## 2018-12-19 ENCOUNTER — Ambulatory Visit (HOSPITAL_COMMUNITY): Payer: Medicare Other | Admitting: Psychiatry

## 2018-12-20 ENCOUNTER — Telehealth: Payer: Self-pay | Admitting: Hematology and Oncology

## 2018-12-20 NOTE — Telephone Encounter (Signed)
Called patient per scheduling VM log, and rescheduled patient lab aand MD visit per patient request.  Patient aware of appt date and time.

## 2018-12-21 ENCOUNTER — Ambulatory Visit (INDEPENDENT_AMBULATORY_CARE_PROVIDER_SITE_OTHER): Payer: Medicare Other | Admitting: Psychiatry

## 2018-12-21 ENCOUNTER — Encounter (HOSPITAL_COMMUNITY): Payer: Self-pay | Admitting: Psychiatry

## 2018-12-21 VITALS — BP 146/70 | HR 70 | Ht 67.0 in | Wt 139.0 lb

## 2018-12-21 DIAGNOSIS — F411 Generalized anxiety disorder: Secondary | ICD-10-CM

## 2018-12-21 DIAGNOSIS — F332 Major depressive disorder, recurrent severe without psychotic features: Secondary | ICD-10-CM | POA: Diagnosis not present

## 2018-12-21 MED ORDER — ESCITALOPRAM OXALATE 20 MG PO TABS: 30.0000 mg | ORAL_TABLET | Freq: Every day | ORAL | 2 refills | Status: DC

## 2018-12-21 NOTE — Progress Notes (Signed)
[name] OP Progress Note  12/21/2018 9:56 AM [name]  MRN:  [id number]  Chief Complaint: I am doing good.  Around Christmas time I took a few days of Ativan that help anxiety.  HPI: [name] came for his appointment.  He is doing very well on Lexapro 30 mg.  In December he was anxious and depressed but when he took few days of Ativan he did respond very well.  He is sleeping good.  He has a good energy level.  He tried to play golf even though weather was cold.  Since he had a [place] he feel his depression is under control.  He denies any irritability, crying spells, negative thoughts or any feeling of hopelessness or worthlessness.  He is tolerating his medication and denies any side effects.  He denies any mania.  He lives with his wife who is very supportive.  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  He does not need a new refill on lorazepam since he already had leftover.  Visit Diagnosis:    ICD-10-CM   1. GAD (generalized anxiety disorder) F41.1 escitalopram (LEXAPRO) 20 MG tablet    Past Psychiatric History: Reviewed. H/O depression and anxiety most of his life. Had a good response with TMS and Lexapro. Tried Celexa, Pristiq and Cymbalta but limited outcome. PCP tried Klonopin but made him groggy.  No h/o suicidal attempt, mania, psychosis or self abusive behavior. Saw Dr. Cheryln Ward in the past for therapy.  Past Medical History:  Past Medical History:  Diagnosis Date  . Anxiety   . BPH (benign prostatic hypertrophy)   . GERD (gastroesophageal reflux disease)   . Sleep apnea     Past Surgical History:  Procedure Laterality Date  . NASAL SEPTOPLASTY W/ TURBINOPLASTY  08/03/2012   Procedure: NASAL SEPTOPLASTY WITH TURBINATE REDUCTION;  Surgeon: Izora Gala, MD;  Location: [facility] OR;  Service: ENT;  Laterality: Bilateral;    Family Psychiatric History: Reviewed.  Family History:  Family History  Problem Relation Age of Onset  . Heart attack Father   .  Hypertension Father   . Hypertension Daughter     Social History:  Social History   Socioeconomic History  . Marital status: Married    Spouse name: Not on file  . Number of children: Not on file  . Years of education: Not on file  . Highest education level: Not on file  Occupational History  . Not on file  Social Needs  . Financial resource strain: Not hard at all  . Food insecurity:    Worry: Never true    Inability: Never true  . Transportation needs:    Medical: No    Non-medical: No  Tobacco Use  . Smoking status: Former Research scientist (life sciences)  . Smokeless tobacco: Never Used  . Tobacco comment: rare use over 50 years   Substance and Sexual Activity  . Alcohol use: Yes    Alcohol/week: 1.0 standard drinks    Types: 1 Shots of liquor per week    Comment: socially  . Drug use: No  . Sexual activity: Never    Birth control/protection: None  Lifestyle  . Physical activity:    Days per week: 0 days    Minutes per session: 0 min  . Stress: Not at all  Relationships  . Social connections:    Talks on phone: Twice a week    Gets together: Once a week    Attends religious service: Never    Active  member of club or organization: Yes    Attends meetings of clubs or organizations: More than 4 times per year    Relationship status: Married  Other Topics Concern  . Not on file  Social History Narrative   Lives in 2 level    Will stay for now    Just moved here approx 1.5 yo   Very nice neighbors    [employer]  x 5 with a very busy holiday   One son is a Teacher, music and nephew is a Investment banker, operational    Son in [place] moved to [place] and he misses his grand children    As 2 brothers in [place]       Retired from [employer], [employer]        Allergies: No Known Allergies  Metabolic Disorder Labs: No results found for: HGBA1C, MPG No results found for: PROLACTIN Lab Results  Component Value Date   CHOL 182 07/08/2016   TRIG 53.0 07/08/2016   HDL 66.70 07/08/2016   CHOLHDL 3 07/08/2016   VLDL  10.6 07/08/2016   LDLCALC 104 (H) 07/08/2016   LDLCALC 113 (H) 10/17/2013   Lab Results  Component Value Date   TSH 1.72 08/15/2017   TSH 2.96 07/08/2016    Therapeutic Level Labs: No results found for: LITHIUM No results found for: VALPROATE No components found for:  CBMZ  Current Medications: Current Outpatient Medications  Medication Sig Dispense Refill  . escitalopram (LEXAPRO) 20 MG tablet Take 1.5 tablets (30 mg total) by mouth daily. 45 tablet 2  . hydrocortisone 2.5 % cream Apply topically 2 (two) times daily as needed. 30 g 1  . LORazepam (ATIVAN) 0.5 MG tablet Take 1 tablet (0.5 mg total) by mouth daily as needed for anxiety. 30 tablet 0  . omeprazole (PRILOSEC) 40 MG capsule TAKE 1 CAPSULE BY MOUTH  DAILY 90 capsule 2  . RA VITAMIN B12 2000 MCG TBCR take 1 tablet by mouth daily 30 tablet 5  . terazosin (HYTRIN) 5 MG capsule TAKE 1 CAPSULE BY MOUTH  DAILY 90 capsule 1   No current facility-administered medications for this visit.      Musculoskeletal: Strength & Muscle Tone: within normal limits Gait & Station: normal Patient leans: N/A  Psychiatric Specialty Exam: ROS  Blood pressure (!) 146/70, pulse 70, height 5\' 7"  (1.702 m), weight 139 lb (63 kg), SpO2 97 %.There is no height or weight on file to calculate BMI.  General Appearance: Casual  Eye Contact:  Good  Speech:  Clear and Coherent  Volume:  Normal  Mood:  Pleasant  Affect:  Appropriate  Thought Process:  Goal Directed  Orientation:  Full (Time, Place, and Person)  Thought Content: Logical   Suicidal Thoughts:  No  Homicidal Thoughts:  No  Memory:  Immediate;   Good Recent;   Good Remote;   Good  Judgement:  Good  Insight:  Good  Psychomotor Activity:  Normal  Concentration:  Concentration: Good and Attention Span: Good  Recall:  Good  Fund of Knowledge: Good  Language: Good  Akathisia:  No  Handed:  Right  AIMS (if indicated): not done  Assets:  Communication Skills Desire for  [place] Talents/Skills Transportation  ADL's:  Intact  Cognition: WNL  Sleep:  Good   Screenings: PHQ2-9     Clinical Support from 11/12/2018 in [place] at [facility] from 09/05/2018 in [place] at [facility] from 02/06/2017 in [place] at  [facility] Clinical Support from 06/17/2016 in [place] at [facility] from 09/08/2015 in [place] at [facility] Total Score  0  0  1  0  0  PHQ-9 Total Score  1  0  -  -  -       Assessment and Plan: Major depressive disorder, recurrent.  Generalized anxiety disorder.  Patient doing better on Lexapro 30 mg daily.  He rarely takes Ativan and he does not need a new prescription.  He has no tremors shakes or any EPS.  He does not feel that he needed therapy.  I will continue Lexapro 30 mg daily.  Recommended to call us back if he has any question or any concern.  Follow-up in 3 months.   Kathlee Nations, MD 12/21/2018, 9:56 AM

## 2018-12-28 DIAGNOSIS — R339 Retention of urine, unspecified: Secondary | ICD-10-CM | POA: Diagnosis not present

## 2018-12-28 DIAGNOSIS — N390 Urinary tract infection, site not specified: Secondary | ICD-10-CM | POA: Diagnosis not present

## 2019-01-03 DIAGNOSIS — R3911 Hesitancy of micturition: Secondary | ICD-10-CM | POA: Diagnosis not present

## 2019-01-03 DIAGNOSIS — R338 Other retention of urine: Secondary | ICD-10-CM | POA: Diagnosis not present

## 2019-01-07 DIAGNOSIS — R338 Other retention of urine: Secondary | ICD-10-CM | POA: Diagnosis not present

## 2019-01-07 DIAGNOSIS — N281 Cyst of kidney, acquired: Secondary | ICD-10-CM | POA: Diagnosis not present

## 2019-01-09 ENCOUNTER — Other Ambulatory Visit: Payer: Self-pay | Admitting: Urology

## 2019-01-10 ENCOUNTER — Other Ambulatory Visit: Payer: Self-pay

## 2019-01-14 ENCOUNTER — Other Ambulatory Visit: Payer: Self-pay | Admitting: Family Medicine

## 2019-01-14 ENCOUNTER — Ambulatory Visit: Payer: Self-pay | Admitting: Hematology and Oncology

## 2019-01-16 NOTE — Telephone Encounter (Signed)
Will need to verify why this refill request is coming in.  We do not have on med list.  Would not refill until we can clarify.

## 2019-01-16 NOTE — Telephone Encounter (Signed)
This medication is not current on med list. Please advise if OK to continue or refuse.

## 2019-01-17 NOTE — Telephone Encounter (Signed)
I left a message for the pt to return a call to the office.  CRM also created.

## 2019-01-18 ENCOUNTER — Other Ambulatory Visit: Payer: Self-pay

## 2019-01-21 ENCOUNTER — Ambulatory Visit: Payer: Self-pay | Admitting: Hematology and Oncology

## 2019-01-21 DIAGNOSIS — N281 Cyst of kidney, acquired: Secondary | ICD-10-CM | POA: Diagnosis not present

## 2019-01-22 ENCOUNTER — Encounter: Payer: Self-pay | Admitting: Family Medicine

## 2019-02-07 DIAGNOSIS — R338 Other retention of urine: Secondary | ICD-10-CM | POA: Diagnosis not present

## 2019-02-13 ENCOUNTER — Other Ambulatory Visit: Payer: Self-pay

## 2019-02-13 ENCOUNTER — Emergency Department (HOSPITAL_COMMUNITY)
Admission: EM | Admit: 2019-02-13 | Discharge: 2019-02-13 | Disposition: A | Discharge status: Home / Self Care | Payer: Medicare Other | Attending: Emergency Medicine | Admitting: Emergency Medicine

## 2019-02-13 ENCOUNTER — Encounter (HOSPITAL_COMMUNITY): Payer: Self-pay

## 2019-02-13 DIAGNOSIS — Y731 Therapeutic (nonsurgical) and rehabilitative gastroenterology and urology devices associated with adverse incidents: Secondary | ICD-10-CM | POA: Insufficient documentation

## 2019-02-13 DIAGNOSIS — T83091A Other mechanical complication of indwelling urethral catheter, initial encounter: Secondary | ICD-10-CM | POA: Diagnosis not present

## 2019-02-13 DIAGNOSIS — T83098A Other mechanical complication of other indwelling urethral catheter, initial encounter: Secondary | ICD-10-CM | POA: Diagnosis not present

## 2019-02-13 DIAGNOSIS — Z79899 Other long term (current) drug therapy: Secondary | ICD-10-CM | POA: Insufficient documentation

## 2019-02-13 DIAGNOSIS — R339 Retention of urine, unspecified: Secondary | ICD-10-CM

## 2019-02-13 DIAGNOSIS — Z87891 Personal history of nicotine dependence: Secondary | ICD-10-CM | POA: Insufficient documentation

## 2019-02-13 LAB — URINALYSIS, ROUTINE W REFLEX MICROSCOPIC
Bilirubin Urine: NEGATIVE
Glucose, UA: NEGATIVE mg/dL
Ketones, ur: NEGATIVE mg/dL
Nitrite: NEGATIVE
Protein, ur: 30 mg/dL — AB
RBC / HPF: >50 RBC/hpf — ABNORMAL HIGH (ref 0–5)
Specific Gravity, Urine: 1.014 (ref 1.005–1.030)
pH: 7 (ref 5.0–8.0)

## 2019-02-13 MED ORDER — LIDOCAINE HCL URETHRAL/MUCOSAL 2 % EX GEL
1.0000 "application " | Freq: Once | CUTANEOUS | Status: AC
  Administered 2019-02-13: 1 via URETHRAL
  Filled 2019-02-13: qty 5

## 2019-02-13 NOTE — ED Notes (Signed)
Bladder Scan : 242ML

## 2019-02-13 NOTE — ED Provider Notes (Signed)
[name] Provider Note   CSN: [id number] Arrival date & time: 02/13/19  1542    History   Chief Complaint Chief Complaint  Patient presents with  . Urinary Retention    HPI [name] is a 83 y.o. male.     [name] is a 83 y.o. male with a history of BPH, anxiety, GERD, sleep apnea, who presents to the emergency department for evaluation of urinary retention.  Patient reports he has had an indwelling Foley catheter in place for the last 5 weeks, and has plans for upcoming prostatectomy with Dr. Tresa Moore for BPH.  This morning after emptying his Foley bag when he got up this morning he has had no further urine passed into the bag and has developed worsening suprapubic abdominal pain and pain of the tip of his penis.  He reports he feels like he needs to urinate but no urine passes.  He noted a small amount of blood in the urine and at the tip of the penis today and reports he has had this occur intermittently since the Foley was inserted.  He has not had any fevers or chills, no nausea or vomiting.  No flank pain.     Past Medical History:  Diagnosis Date  . Anxiety   . BPH (benign prostatic hypertrophy)   . GERD (gastroesophageal reflux disease)   . Sleep apnea     Patient Active Problem List   Diagnosis Date Noted  . Iron deficiency anemia 07/09/2018  . Depression, recurrent ([place]) 08/15/2017  . Anxiety state 02/06/2017  . Prediabetes 06/26/2014  . Normocytic anemia 06/26/2014  . Vitamin B 12 deficiency 06/26/2014  . GERD (gastroesophageal reflux disease) 10/20/2011  . DYSTHYMIA 09/17/2010  . HYPERLIPIDEMIA 02/20/2008  . ERECTILE DYSFUNCTION 02/20/2008  . BPH (benign prostatic hyperplasia) 02/20/2008  . SPINAL STENOSIS 06/06/2007    Past Surgical History:  Procedure Laterality Date  . NASAL SEPTOPLASTY W/ TURBINOPLASTY  08/03/2012   Procedure: NASAL SEPTOPLASTY WITH TURBINATE REDUCTION;  Surgeon: Izora Gala, MD;   Location: [facility];  Service: ENT;  Laterality: Bilateral;        Home Medications    Prior to Admission medications   Medication Sig Start Date End Date Taking? Authorizing Provider  acetaminophen (TYLENOL) 500 MG tablet Take 1,000 mg by mouth every 6 (six) hours as needed for moderate pain or headache.    [provider]  amLODipine (NORVASC) 5 MG tablet TAKE 1 TABLET BY MOUTH DAILY Patient taking differently: Take 5 mg by mouth daily.  01/23/19   Burchette, Alinda Sierras, MD  escitalopram (LEXAPRO) 10 MG tablet Take 10 mg by mouth every evening. Take with 20 mg tablet to equal 30 mg daily    [provider]  escitalopram (LEXAPRO) 20 MG tablet Take 1.5 tablets (30 mg total) by mouth daily. Patient taking differently: Take 20 mg by mouth every evening. Take with 10 mg tablet to equal 30 mg daily 12/21/18   Arfeen, Arlyce Harman, MD  hydrocortisone 2.5 % cream Apply topically 2 (two) times daily as needed. Patient taking differently: Apply 1 application topically 2 (two) times daily as needed (rash).  10/17/17   Burchette, Alinda Sierras, MD  LORazepam (ATIVAN) 0.5 MG tablet Take 1 tablet (0.5 mg total) by mouth daily as needed for anxiety. 11/13/18 11/13/19  Arfeen, Arlyce Harman, MD  omeprazole (PRILOSEC) 40 MG capsule TAKE 1 CAPSULE BY MOUTH  DAILY Patient taking differently: Take 40 mg by mouth daily  before breakfast.  07/18/18   Burchette, Alinda Sierras, MD  RA VITAMIN B12 2000 MCG TBCR take 1 tablet by mouth daily Patient taking differently: Take 2,000 mcg by mouth daily.  10/06/16   Burchette, Alinda Sierras, MD  terazosin (HYTRIN) 5 MG capsule TAKE 1 CAPSULE BY MOUTH  DAILY Patient not taking: Reported on 02/08/2019 07/18/18   [name], MD    Family History Family History  Problem Relation Age of Onset  . Heart attack Father   . Hypertension Father   . Hypertension Daughter     Social History Social History   Tobacco Use  . Smoking status: Former Research scientist (life sciences)  . Smokeless tobacco: Never Used  .  Tobacco comment: rare use over 50 years   Substance Use Topics  . Alcohol use: Yes    Alcohol/week: 1.0 standard drinks    Types: 1 Shots of liquor per week    Comment: socially  . Drug use: No     Allergies   Patient has no known allergies.   Review of Systems Review of Systems  Constitutional: Negative for chills and fever.  Gastrointestinal: Positive for abdominal pain (suprapubic). Negative for nausea and vomiting.  Genitourinary: Positive for decreased urine volume, difficulty urinating, dysuria, hematuria and penile pain. Negative for discharge, flank pain, frequency, penile swelling and scrotal swelling.  Musculoskeletal: Negative for back pain.  Skin: Negative for color change and rash.  All other systems reviewed and are negative.    Physical Exam Updated Vital Signs BP (!) 148/68 (BP Location: Left Arm)   Pulse 76   Temp 98.6 F (37 C) (Oral)   Resp 18   Ht 5\' 7"  (1.702 m)   Wt 62.1 kg   SpO2 99%   BMI 21.46 kg/m   Physical Exam Vitals signs and nursing note reviewed.  Constitutional:      General: He is not in acute distress.    Appearance: He is well-developed. He is not diaphoretic.  HENT:     Head: Normocephalic and atraumatic.  Eyes:     General:        Right eye: No discharge.        Left eye: No discharge.  Cardiovascular:     Rate and Rhythm: Normal rate and regular rhythm.     Heart sounds: Normal heart sounds. No murmur. No friction rub. No gallop.   Pulmonary:     Effort: Pulmonary effort is normal. No respiratory distress.     Breath sounds: Normal breath sounds.     Comments: Respirations equal and unlabored, patient able to speak in full sentences, lungs clear to auscultation bilaterally Abdominal:     General: Abdomen is flat. Bowel sounds are normal.     Palpations: Abdomen is soft.     Tenderness: There is abdominal tenderness. There is no guarding.     Comments: Abdomen is soft, nondistended, bowel sounds present throughout.   Palpable distended bladder.  With associated suprapubic tenderness.  No guarding or rebound tenderness.  Genitourinary:    Comments: Chaperone present during genital exam. Foley catheter present at the urinary meatus with small amount of dried blood noted.  Leg bag attached, there is no urine present in the bladder, there is some dried sediment noted.  No active bleeding noted.  No testicular swelling or pain on palpation Skin:    General: Skin is warm and dry.  Neurological:     Mental Status: He is alert and oriented to person, place, and time.  Coordination: Coordination normal.  Psychiatric:        Mood and Affect: Mood normal.        Behavior: Behavior normal.      ED Treatments / Results  Labs (all labs ordered are listed, but only abnormal results are displayed) Labs Reviewed  URINALYSIS, ROUTINE W REFLEX MICROSCOPIC - Abnormal; Notable for the following components:      Result Value   APPearance CLOUDY (*)    Hgb urine dipstick LARGE (*)    Protein, ur 30 (*)    Leukocytes,Ua SMALL (*)    RBC / HPF >50 (*)    Bacteria, UA FEW (*)    All other components within normal limits  URINE CULTURE    EKG None  Radiology No results found.  Procedures Procedures (including critical care time)  Medications Ordered in ED Medications  lidocaine (XYLOCAINE) 2 % jelly 1 application (1 application Urethral Given 02/13/19 1613)     Initial Impression / Assessment and Plan / ED Course  I have reviewed the triage vital signs and the nursing notes.  Pertinent labs & imaging results that were available during my care of the patient were reviewed by me and considered in my medical decision making (see chart for details).  84 year old male presenting with acute urinary retention.  Patient has had an indwelling Foley catheter in place for the last 5 weeks, placed by Dr. Wilhelmenia Blase with urology in anticipation of upcoming robotic prostatectomy in the setting of BPH.  He reports that  this morning after emptying his Foley bag he has had no additional urine flow into the bag and has developed suprapubic pain as well as pain around the tip of his penis.  He reports he noted a small amount of blood in the Foley bag but that this has occurred intermittently since he had Foley placed.  Foley was last changed in the urology office last week.  He denies any new urinary symptoms, no flank pain.  No fevers or chills, no nausea or vomiting.  On exam small amount of dried blood at the urinary meatus, Foley catheter in place with leg bag with only dried sediment noted in the bag.  We will plan to change out Foley catheter, I suspect catheter obstruction as cause for patient's symptoms, bladder scan shows greater than 200 mL's, but palpable distended bladder suggests even more.  Patient is afebrile with normal vitals.  Will check urinalysis and send for culture.  Foley catheter exchanged and patient passed 1 small blood clot and since then has been passing clear yellow urine.  He reports pain has completely resolved.  No further obstructive symptoms.  Urinalysis shows RBCs and WBCs with a few bacteria, urine appears dirty as expected with chronic indwelling catheter, a culture was sent but do not feel that any antibiotics are indicated at this time.  Patient is feeling much better.  He is stable for discharge home with follow-up with Dr. Tammi Klippel as needed.  I have discussed with the patient if he has obstruction of his catheter again he should call his urologist and then proceed to the emergency department if need be.  Patient expresses understanding and agreement with plan.  Final Clinical Impressions(s) / ED Diagnoses   Final diagnoses:  Urinary retention  Obstruction of Foley catheter, initial encounter [facility])    ED Discharge Orders    None       Janet Berlin 02/13/19 1850    Julianne Rice, MD 02/15/19 [phone]

## 2019-02-13 NOTE — ED Triage Notes (Signed)
Patient has catheter in place x 5 weeks for upcoming prostate surgery on 4/24. Patient woke up this am and his catheter bag was full. After 10am, no urine output and a lot of pain to lower abd and tip of penis. Denies n/v.

## 2019-02-13 NOTE — Discharge Instructions (Signed)
Your catheter was clogged by some sediment and blood, it seems to be flowing much better now.  Continue with your catheter care as directed by your urologist at home and follow-up with Dr. Tresa Moore as planned.  If your catheter becomes blocked again and is no longer flowing call Dr. Zettie Pho office or return to the emergency department.  You will be called if your urine culture grows anything that requires antibiotics, and medication will be called in.

## 2019-02-14 DIAGNOSIS — R338 Other retention of urine: Secondary | ICD-10-CM | POA: Diagnosis not present

## 2019-02-16 ENCOUNTER — Encounter (HOSPITAL_COMMUNITY): Payer: Self-pay | Admitting: Emergency Medicine

## 2019-02-16 ENCOUNTER — Other Ambulatory Visit: Payer: Self-pay

## 2019-02-16 ENCOUNTER — Emergency Department (HOSPITAL_COMMUNITY)
Admission: EM | Admit: 2019-02-16 | Discharge: 2019-02-16 | Disposition: A | Discharge status: Home / Self Care | Payer: Medicare Other | Attending: Emergency Medicine | Admitting: Emergency Medicine

## 2019-02-16 DIAGNOSIS — R339 Retention of urine, unspecified: Secondary | ICD-10-CM

## 2019-02-16 DIAGNOSIS — R338 Other retention of urine: Secondary | ICD-10-CM | POA: Diagnosis not present

## 2019-02-16 DIAGNOSIS — T83098A Other mechanical complication of other indwelling urethral catheter, initial encounter: Secondary | ICD-10-CM | POA: Diagnosis not present

## 2019-02-16 DIAGNOSIS — Y828 Other medical devices associated with adverse incidents: Secondary | ICD-10-CM | POA: Insufficient documentation

## 2019-02-16 LAB — URINALYSIS, ROUTINE W REFLEX MICROSCOPIC
Bilirubin Urine: NEGATIVE
Glucose, UA: NEGATIVE mg/dL
Ketones, ur: 5 mg/dL — AB
Nitrite: NEGATIVE
Protein, ur: NEGATIVE mg/dL
Specific Gravity, Urine: 1.014 (ref 1.005–1.030)
pH: 7 (ref 5.0–8.0)

## 2019-02-16 LAB — URINE CULTURE: Culture: 80000 — AB

## 2019-02-16 MED ORDER — LIDOCAINE HCL URETHRAL/MUCOSAL 2 % EX GEL
1.0000 "application " | Freq: Once | CUTANEOUS | Status: DC
  Filled 2019-02-16: qty 5

## 2019-02-16 NOTE — ED Triage Notes (Signed)
Patient here from home with complaints of clogged catheter. Reports that this has been happening "a lot" Was seen here on 4/15 and had it changed. Surgery on 4/24.

## 2019-02-16 NOTE — ED Provider Notes (Signed)
[facility] DEPT Provider Note   CSN: [id number] Arrival date & time: 02/16/19  1357    History   Chief Complaint Chief Complaint  Patient presents with  . Urinary Retention  . Clogged Catheter    HPI [name] is a 83 y.o. male.     HPI   He presents for evaluation of difficulty with Foley catheter drainage, for several days.  This is happened numerous times since he had a Foley placed for prostatic atrophy with urinary retention, about 7 weeks ago.  He has a planned prostatectomy, 02/22/2019.  His fever, nausea, vomiting, weakness or dizziness.  There are no other known modifying factors.  Past Medical History:  Diagnosis Date  . Anxiety   . BPH (benign prostatic hypertrophy)   . GERD (gastroesophageal reflux disease)   . Sleep apnea     Patient Active Problem List   Diagnosis Date Noted  . Iron deficiency anemia 07/09/2018  . Depression, recurrent ([place]) 08/15/2017  . Anxiety state 02/06/2017  . Prediabetes 06/26/2014  . Normocytic anemia 06/26/2014  . Vitamin B 12 deficiency 06/26/2014  . GERD (gastroesophageal reflux disease) 10/20/2011  . DYSTHYMIA 09/17/2010  . HYPERLIPIDEMIA 02/20/2008  . ERECTILE DYSFUNCTION 02/20/2008  . BPH (benign prostatic hyperplasia) 02/20/2008  . SPINAL STENOSIS 06/06/2007    Past Surgical History:  Procedure Laterality Date  . NASAL SEPTOPLASTY W/ TURBINOPLASTY  08/03/2012   Procedure: NASAL SEPTOPLASTY WITH TURBINATE REDUCTION;  Surgeon: Izora Gala, MD;  Location: [facility];  Service: ENT;  Laterality: Bilateral;        Home Medications    Prior to Admission medications   Medication Sig Start Date End Date Taking? Authorizing Provider  acetaminophen (TYLENOL) 500 MG tablet Take 1,000 mg by mouth every 6 (six) hours as needed for moderate pain or headache.    [provider]  amLODipine (NORVASC) 5 MG tablet TAKE 1 TABLET BY MOUTH DAILY Patient taking differently: Take 5 mg by mouth  daily.  01/23/19   Burchette, Alinda Sierras, MD  escitalopram (LEXAPRO) 10 MG tablet Take 10 mg by mouth every evening. Take with 20 mg tablet to equal 30 mg daily    [provider]  escitalopram (LEXAPRO) 20 MG tablet Take 1.5 tablets (30 mg total) by mouth daily. Patient taking differently: Take 20 mg by mouth every evening. Take with 10 mg tablet to equal 30 mg daily 12/21/18   Arfeen, Arlyce Harman, MD  hydrocortisone 2.5 % cream Apply topically 2 (two) times daily as needed. Patient taking differently: Apply 1 application topically 2 (two) times daily as needed (rash).  10/17/17   Burchette, Alinda Sierras, MD  LORazepam (ATIVAN) 0.5 MG tablet Take 1 tablet (0.5 mg total) by mouth daily as needed for anxiety. 11/13/18 11/13/19  Arfeen, Arlyce Harman, MD  omeprazole (PRILOSEC) 40 MG capsule TAKE 1 CAPSULE BY MOUTH  DAILY Patient taking differently: Take 40 mg by mouth daily before breakfast.  07/18/18   Burchette, Alinda Sierras, MD  RA VITAMIN B12 2000 MCG TBCR take 1 tablet by mouth daily Patient taking differently: Take 2,000 mcg by mouth daily.  10/06/16   Burchette, Alinda Sierras, MD  terazosin (HYTRIN) 5 MG capsule TAKE 1 CAPSULE BY MOUTH  DAILY Patient not taking: Reported on 02/08/2019 07/18/18   [name], MD    Family History Family History  Problem Relation Age of Onset  . Heart attack Father   . Hypertension Father   . Hypertension Daughter  Social History Social History   Tobacco Use  . Smoking status: Former Research scientist (life sciences)  . Smokeless tobacco: Never Used  . Tobacco comment: rare use over 50 years   Substance Use Topics  . Alcohol use: Yes    Alcohol/week: 1.0 standard drinks    Types: 1 Shots of liquor per week    Comment: socially  . Drug use: No     Allergies   Patient has no known allergies.   Review of Systems Review of Systems  All other systems reviewed and are negative.    Physical Exam Updated Vital Signs BP (!) 144/75   Pulse 70   Temp 98.2 F (36.8 C) (Oral)   Resp  18   SpO2 100%   Physical Exam Vitals signs and nursing note reviewed.  Constitutional:      General: He is not in acute distress.    Appearance: He is well-developed. He is not ill-appearing or diaphoretic.  HENT:     Head: Normocephalic and atraumatic.     Right Ear: External ear normal.     Left Ear: External ear normal.  Eyes:     Conjunctiva/sclera: Conjunctivae normal.     Pupils: Pupils are equal, round, and reactive to light.  Neck:     Musculoskeletal: Normal range of motion and neck supple.     Trachea: Phonation normal.  Cardiovascular:     Rate and Rhythm: Normal rate.  Pulmonary:     Effort: Pulmonary effort is normal.  Abdominal:     General: There is no distension.     Palpations: Abdomen is soft. There is no mass.     Tenderness: There is no abdominal tenderness.     Comments: Examined after Foley catheter placement  Musculoskeletal: Normal range of motion.  Skin:    General: Skin is warm and dry.  Neurological:     Mental Status: He is alert and oriented to person, place, and time.     Cranial Nerves: No cranial nerve deficit.     Sensory: No sensory deficit.     Motor: No abnormal muscle tone.     Coordination: Coordination normal.  Psychiatric:        Mood and Affect: Mood normal.        Behavior: Behavior normal.        Thought Content: Thought content normal.        Judgment: Judgment normal.      ED Treatments / Results  Labs (all labs ordered are listed, but only abnormal results are displayed) Labs Reviewed  URINALYSIS, ROUTINE W REFLEX MICROSCOPIC - Abnormal; Notable for the following components:      Result Value   Hgb urine dipstick MODERATE (*)    Ketones, ur 5 (*)    Leukocytes,Ua LARGE (*)    Bacteria, UA RARE (*)    All other components within normal limits    EKG None  Radiology No results found.  Procedures Procedures (including critical care time)  Medications Ordered in ED Medications - No data to  display   Initial Impression / Assessment and Plan / ED Course  I have reviewed the triage vital signs and the nursing notes.  Pertinent labs & imaging results that were available during my care of the patient were reviewed by me and considered in my medical decision making (see chart for details).  Clinical Course as of Feb 15 1633  Sat Feb 16, 2019  1632 Abnormal, increased leukocytes, red cells and white cells.  Urinalysis, Routine w reflex microscopic(!) [EW]    Clinical Course User Index [EW] Daleen Bo, MD        Patient Vitals for the past 24 hrs:  BP Temp Temp src Pulse Resp SpO2  02/16/19 1536 - - - 70 - -  02/16/19 1534 (!) 144/75 - - 71 18 100 %  02/16/19 1410 (!) 165/85 98.2 F (36.8 C) Oral 72 18 99 %    4:34 PM Reevaluation with update and discussion. After initial assessment and treatment, an updated evaluation reveals he is comfortable at this time, with Foley placement.  Findings discussed and questions answered. Daleen Bo   Medical Decision Making: Urinary retention, increased urinary bladder content, requiring replacement of Foley catheter.  Patient improved after replacement.  No clear evidence for UTI.  Abnormal urinalysis with chronic Foley catheter, does not indicate UTI, with lack of associated findings for UTI.  Doubt serious bacterial infection or metabolic instability.  CRITICAL CARE-no Performed by: Daleen Bo  Nursing Notes Reviewed/ Care Coordinated Applicable Imaging Reviewed Interpretation of Laboratory Data incorporated into ED treatment  The patient appears reasonably screened and/or stabilized for discharge and I doubt any other medical condition or other [facility] requiring further screening, evaluation, or treatment in the ED at this time prior to discharge.  Plan: Home Medications-continue usual; Home Treatments-catheter care at home; return here if the recommended treatment, does not improve the symptoms; Recommended follow  up-urology as scheduled   Final Clinical Impressions(s) / ED Diagnoses   Final diagnoses:  None    ED Discharge Orders    None       Daleen Bo, MD 02/16/19 [phone]

## 2019-02-16 NOTE — Discharge Instructions (Signed)
See Dr. Tresa Moore as scheduled.

## 2019-02-17 ENCOUNTER — Telehealth: Payer: Self-pay | Admitting: Emergency Medicine

## 2019-02-17 NOTE — Telephone Encounter (Signed)
Post ED Visit - Positive Culture Follow-up: Successful Patient Follow-Up  Culture assessed and recommendations reviewed by:  []  Elenor Quinones, Pharm.D. []  Heide Guile, Pharm.D., BCPS AQ-ID []  Parks Neptune, Pharm.D., BCPS []  Alycia Rossetti, Pharm.D., BCPS []  [place], Pharm.D., BCPS, AAHIVP [x]  Netta Cedars, Pharm.D., BCPS, AAHIVP []  Salome Arnt, PharmD, BCPS []  Johnnette Gourd, PharmD, BCPS []  Hughes Better, PharmD, BCPS []  Leeroy Cha, PharmD  Positive urine culture  [x]  Patient discharged without antimicrobial prescription and treatment is now indicated []  Organism is resistant to prescribed ED discharge antimicrobial []  Patient with positive blood cultures  Changes discussed with ED provider: Kennith Maes PA  New antibiotic prescription: Keflex 500 mg PO BID x seven days Called to [facility] [phone] ( [address])  Contacted patient, date 02/17/19, time [place] 02/17/2019, 4:06 PM

## 2019-02-17 NOTE — Progress Notes (Signed)
ED Antimicrobial Stewardship Positive Culture Follow Up   [name] is an 83 y.o. male who presented to [facility] on 02/16/2019 with a chief complaint of: Chief Complaint  Patient presents with  . Urinary Retention  . Clogged Catheter    Recent Results (from the past 720 hour(s))  Urine culture     Status: Abnormal   Collection Time: 02/13/19  3:52 PM  Result Value Ref Range Status   Specimen Description   Final    URINE, RANDOM Performed at [facility] [address], [place], [place] [address]    Special Requests   Final    NONE Performed at [facility], [place] [address], [place], [place] [address]    Culture 80,000 COLONIES/mL STAPHYLOCOCCUS EPIDERMIDIS (A)  Final   Report Status 02/16/2019 FINAL  Final   Organism ID, Bacteria STAPHYLOCOCCUS EPIDERMIDIS (A)  Final      Susceptibility   Staphylococcus epidermidis - MIC*    CIPROFLOXACIN <=0.5 SENSITIVE Sensitive     GENTAMICIN <=0.5 SENSITIVE Sensitive     NITROFURANTOIN <=16 SENSITIVE Sensitive     OXACILLIN >=4 RESISTANT Resistant     TETRACYCLINE 4 SENSITIVE Sensitive     VANCOMYCIN 1 SENSITIVE Sensitive     TRIMETH/SULFA >=320 RESISTANT Resistant     CLINDAMYCIN <=0.25 SENSITIVE Sensitive     RIFAMPIN <=0.5 SENSITIVE Sensitive     Inducible Clindamycin NEGATIVE Sensitive     * 80,000 COLONIES/mL STAPHYLOCOCCUS EPIDERMIDIS    []  Treated with , organism resistant to prescribed antimicrobial [x]  Patient discharged originally without antimicrobial agent and treatment is now indicated  New antibiotic prescription: Keflex 500mg  PO BID x 7 days  ED Provider: Keene Breath (Dr Alvino Chapel curbside consulted with Dr Matilde Sprang to determine if antibiotics warranted).     Biagio Borg 02/17/2019, 2:21 PM Clinical Pharmacist [phone]

## 2019-02-19 ENCOUNTER — Encounter (HOSPITAL_COMMUNITY): Payer: Self-pay

## 2019-02-19 NOTE — Patient Instructions (Addendum)
Your procedure is scheduled on: Friday, February 22, 2019   Surgery Time:  7:30AM-10:42AM   Report to [facility]  Entrance   Report to Short Stay at 5:30 AM   Call this number if you have problems the morning of surgery [phone]   Bring CPAP mask and tubing day of surgery   The day before surgery:   CLEAR LIQUID DIET  Foods Allowed                                                                     Foods Excluded  Water, Black Coffee and tea, regular and decaf                             liquids that you cannot  Plain Jell-O in any flavor                                             see through such as: Fruit ices (not with fruit pulp)                                     milk, soups, orange juice  Iced Popsicles                                    All solid food Carbonated beverages, regular and diet                                    Cranberry, grape and apple juices Sports drinks like Gatorade Lightly seasoned clear broth or consume(fat free) Sugar, honey syrup  Sample Menu Breakfast                                Lunch                                     Supper Cranberry juice                    Beef broth                            Chicken broth Jell-O                                     Grape juice                           Apple juice Coffee or tea  Jell-O                                      Popsicle                                                Coffee or tea                        Coffee or tea   Magnesium Citrate:  Drink by noon the day before surgery.   Do not eat food or drink liquids :After Midnight.   Brush your teeth the morning of surgery.   Do NOT smoke after Midnight   Take these medicines the morning of surgery with A SIP OF WATER: Amlodipine, Omeprazole, Lorazepam if needed                               You may not have any metal on your body including jewelry, and body piercings             Do not wear  lotions, powders, perfumes/cologne, or deodorant                           Men may shave face and neck.   Do not bring valuables to the hospital. [place].   Contacts, dentures or bridgework may not be worn into surgery.   Leave suitcase in the car. After surgery it may be brought to your room.    Special Instructions: Bring a copy of your healthcare power of attorney and living will documents         the day of surgery if you haven't scanned them in before.              Please read over the following fact sheets you were given:  [facility] - Preparing for Surgery Before surgery, you can play an important role.  Because skin is not sterile, your skin needs to be as free of germs as possible.  You can reduce the number of germs on your skin by washing with CHG (chlorahexidine gluconate) soap before surgery.  CHG is an antiseptic cleaner which kills germs and bonds with the skin to continue killing germs even after washing. Please DO NOT use if you have an allergy to CHG or antibacterial soaps.  If your skin becomes reddened/irritated stop using the CHG and inform your nurse when you arrive at Short Stay. Do not shave (including legs and underarms) for at least 48 hours prior to the first CHG shower.  You may shave your face/neck.  Please follow these instructions carefully:  1.  Shower with CHG Soap the night before surgery and the  morning of surgery.  2.  If you choose to wash your hair, wash your hair first as usual with your normal  shampoo.  3.  After you shampoo, rinse your hair and body thoroughly to remove the shampoo.  4.  Use CHG as you would any other liquid soap.  You can apply chg directly to the skin and wash.  Gently with a scrungie or clean washcloth.  5.  Apply the CHG Soap to your body ONLY FROM THE NECK DOWN.   Do   not use on face/ open                           Wound or open sores. Avoid  contact with eyes, ears mouth and   genitals (private parts).                       Wash face,  Genitals (private parts) with your normal soap.             6.  Wash thoroughly, paying special attention to the area where your    surgery  will be performed.  7.  Thoroughly rinse your body with warm water from the neck down.  8.  DO NOT shower/wash with your normal soap after using and rinsing off the CHG Soap.                9.  Pat yourself dry with a clean towel.            10.  Wear clean pajamas.            11.  Place clean sheets on your bed the night of your first shower and do not  sleep with pets. Day of Surgery : Do not apply any lotions/deodorants the morning of surgery.  Please wear clean clothes to the hospital/surgery center.  FAILURE TO FOLLOW THESE INSTRUCTIONS MAY RESULT IN THE CANCELLATION OF YOUR SURGERY  PATIENT SIGNATURE_________________________________  NURSE SIGNATURE__________________________________  ________________________________________________________________________

## 2019-02-19 NOTE — Progress Notes (Addendum)
SPOKE W/  [facility]     SCREENING SYMPTOMS OF COVID 19:   COUGH--no  RUNNY NOSE--- no  SORE THROAT---no  NASAL CONGESTION----no  SNEEZING----no  SHORTNESS OF BREATH---no  DIFFICULTY BREATHING---no  TEMP >100.4-----no  UNEXPLAINED BODY ACHES------no   HAVE YOU OR ANY FAMILY MEMBER TRAVELLED PAST 14 DAYS OUT OF THE   COUNTY---no STATE----no COUNTRY----no  HAVE YOU OR ANY FAMILY MEMBER BEEN EXPOSED TO ANYONE WITH COVID 19? no

## 2019-02-20 ENCOUNTER — Other Ambulatory Visit (HOSPITAL_COMMUNITY): Payer: Self-pay

## 2019-02-20 ENCOUNTER — Encounter (HOSPITAL_COMMUNITY)
Admission: RE | Admit: 2019-02-20 | Discharge: 2019-02-20 | Disposition: A | Discharge status: Home / Self Care | Payer: Medicare Other | Source: Ambulatory Visit | Attending: Urology | Admitting: Urology

## 2019-02-20 ENCOUNTER — Other Ambulatory Visit: Payer: Self-pay

## 2019-02-20 ENCOUNTER — Encounter (HOSPITAL_COMMUNITY): Payer: Self-pay

## 2019-02-20 DIAGNOSIS — R9431 Abnormal electrocardiogram [ECG] [EKG]: Secondary | ICD-10-CM | POA: Insufficient documentation

## 2019-02-20 DIAGNOSIS — N179 Acute kidney failure, unspecified: Secondary | ICD-10-CM | POA: Diagnosis not present

## 2019-02-20 DIAGNOSIS — N210 Calculus in bladder: Secondary | ICD-10-CM | POA: Diagnosis not present

## 2019-02-20 DIAGNOSIS — I10 Essential (primary) hypertension: Secondary | ICD-10-CM | POA: Diagnosis not present

## 2019-02-20 DIAGNOSIS — I959 Hypotension, unspecified: Secondary | ICD-10-CM | POA: Diagnosis not present

## 2019-02-20 DIAGNOSIS — R338 Other retention of urine: Secondary | ICD-10-CM | POA: Diagnosis not present

## 2019-02-20 DIAGNOSIS — E785 Hyperlipidemia, unspecified: Secondary | ICD-10-CM | POA: Diagnosis not present

## 2019-02-20 DIAGNOSIS — Z01818 Encounter for other preprocedural examination: Secondary | ICD-10-CM

## 2019-02-20 DIAGNOSIS — N401 Enlarged prostate with lower urinary tract symptoms: Secondary | ICD-10-CM

## 2019-02-20 DIAGNOSIS — N281 Cyst of kidney, acquired: Secondary | ICD-10-CM | POA: Diagnosis not present

## 2019-02-20 DIAGNOSIS — R7303 Prediabetes: Secondary | ICD-10-CM | POA: Diagnosis not present

## 2019-02-20 DIAGNOSIS — D509 Iron deficiency anemia, unspecified: Secondary | ICD-10-CM | POA: Diagnosis not present

## 2019-02-20 DIAGNOSIS — K219 Gastro-esophageal reflux disease without esophagitis: Secondary | ICD-10-CM | POA: Diagnosis not present

## 2019-02-20 DIAGNOSIS — N3289 Other specified disorders of bladder: Secondary | ICD-10-CM | POA: Diagnosis not present

## 2019-02-20 DIAGNOSIS — Z79899 Other long term (current) drug therapy: Secondary | ICD-10-CM | POA: Diagnosis not present

## 2019-02-20 DIAGNOSIS — Z86010 Personal history of colonic polyps: Secondary | ICD-10-CM | POA: Diagnosis not present

## 2019-02-20 DIAGNOSIS — Z8249 Family history of ischemic heart disease and other diseases of the circulatory system: Secondary | ICD-10-CM | POA: Diagnosis not present

## 2019-02-20 DIAGNOSIS — D62 Acute posthemorrhagic anemia: Secondary | ICD-10-CM | POA: Diagnosis not present

## 2019-02-20 DIAGNOSIS — Z87891 Personal history of nicotine dependence: Secondary | ICD-10-CM | POA: Diagnosis not present

## 2019-02-20 HISTORY — DX: Personal history of colonic polyps: Z86.010

## 2019-02-20 HISTORY — DX: Essential (primary) hypertension: I10

## 2019-02-20 HISTORY — DX: Presence of urogenital implants: Z96.0

## 2019-02-20 HISTORY — DX: Presence of other specified devices: Z97.8

## 2019-02-20 HISTORY — DX: Personal history of colon polyps, unspecified: Z86.0100

## 2019-02-20 HISTORY — DX: Cyst of kidney, acquired: N28.1

## 2019-02-20 HISTORY — DX: Major depressive disorder, single episode, unspecified: F32.9

## 2019-02-20 HISTORY — DX: Personal history of other diseases of the digestive system: Z87.19

## 2019-02-20 HISTORY — DX: Iron deficiency anemia, unspecified: D50.9

## 2019-02-20 HISTORY — DX: Hyperlipidemia, unspecified: E78.5

## 2019-02-20 HISTORY — DX: Depression, unspecified: F32.A

## 2019-02-20 HISTORY — DX: Other intervertebral disc degeneration, lumbar region: M51.36

## 2019-02-20 HISTORY — DX: Prediabetes: R73.03

## 2019-02-20 LAB — BASIC METABOLIC PANEL WITH GFR
Anion gap: 7 (ref 5–15)
BUN: 20 mg/dL (ref 8–23)
CO2: 26 mmol/L (ref 22–32)
Calcium: 8.8 mg/dL — ABNORMAL LOW (ref 8.9–10.3)
Chloride: 100 mmol/L (ref 98–111)
Creatinine, Ser: 0.71 mg/dL (ref 0.61–1.24)
GFR calc Af Amer: >60 mL/min
GFR calc non Af Amer: >60 mL/min
Glucose, Bld: 92 mg/dL (ref 70–99)
Potassium: 4.3 mmol/L (ref 3.5–5.1)
Sodium: 133 mmol/L — ABNORMAL LOW (ref 135–145)

## 2019-02-20 LAB — CBC
HCT: 40.3 % (ref 39.0–52.0)
Hemoglobin: 13 g/dL (ref 13.0–17.0)
MCH: 26.5 pg (ref 26.0–34.0)
MCHC: 32.3 g/dL (ref 30.0–36.0)
MCV: 82.1 fL (ref 80.0–100.0)
Platelets: 217 K/uL (ref 150–400)
RBC: 4.91 MIL/uL (ref 4.22–5.81)
RDW: 13.9 % (ref 11.5–15.5)
WBC: 6.4 K/uL (ref 4.0–10.5)
nRBC: 0 % (ref 0.0–0.2)

## 2019-02-20 LAB — HEMOGLOBIN A1C
Hgb A1c MFr Bld: 5.7 % — ABNORMAL HIGH (ref 4.8–5.6)
Mean Plasma Glucose: 116.89 mg/dL

## 2019-02-20 MED ORDER — MAGNESIUM CITRATE PO SOLN
1.0000 | Freq: Once | ORAL | Status: DC
  Filled 2019-02-20: qty 296

## 2019-02-20 NOTE — Pre-Procedure Instructions (Signed)
BMP results 02/20/2019 sent to Dr. Tresa Moore via epic.

## 2019-02-21 NOTE — Progress Notes (Signed)
Anesthesia Chart Review   Case:  [id number] Date/Time:  02/22/19 0700   Procedure:  XI ROBOTIC ASSISTED SIMPLE PROSTATECTOMY (N/A )   Anesthesia type:  General   Pre-op diagnosis:  MASSIVE PROSTATE WITH URINARY RETENTION   Location:  [facility] ROOM 03 / [facility]   Surgeon:  Alexis Frock, MD      DISCUSSION:83 yo former smoker with h/o anxiety, depression, pre-diabetes, sleep apnea w/o CPAP, GERD, HTN, iron deficiency anemia (IV iron therapy 07/13/18 x 2 doses, hemoglobin 13 at PAT 02/20/19), massive prostate with urinary retention, indwelling foley catheter in place scheduled for above procedure 02/22/19 with Dr. Alexis Frock.   Last seen by PCP, Dr. Carolann Littler, 09/15/18.  Stable at this visit with 1 year follow up recommended.    Pt can proceed with planned procedure barring acute status change.  VS: BP 139/72   Pulse 65   Temp 36.9 C (Oral)   Resp 16   Ht 5\' 7"  (1.702 m)   Wt 62.8 kg   SpO2 100%   BMI 21.68 kg/m   PROVIDERS: [name], MD is PCP   Nicholas Lose, MD is Oncologist  LABS: Labs reviewed: Acceptable for surgery. (all labs ordered are listed, but only abnormal results are displayed)  Labs Reviewed  BASIC METABOLIC PANEL - Abnormal; Notable for the following components:      Result Value   Sodium 133 (*)    Calcium 8.8 (*)    All other components within normal limits  HEMOGLOBIN A1C - Abnormal; Notable for the following components:   Hgb A1c MFr Bld 5.7 (*)    All other components within normal limits  CBC     IMAGES:   EKG: 02/20/2019 Rate 63 bpm Normal sinus rhythm  Nonspecific ST abnormality Abnormal ECG No significant change since last tracing   CV:  Past Medical History:  Diagnosis Date  . Anxiety   . BPH (benign prostatic hypertrophy)   . DDD (degenerative disc disease), lumbar 2002  . Depression   . GERD (gastroesophageal reflux disease)   . History of colon polyps   . History of hiatal hernia 2012  . Hyperlipemia    Mild   . Hypertension   . Indwelling Foley catheter present   . Iron deficiency anemia    IV iron therapy   . Pre-diabetes   . Renal cyst 2008   7.6 cm lower pole right renal cyst  . Sleep apnea    slight, no CPAP used    Past Surgical History:  Procedure Laterality Date  . COLONOSCOPY  08/2011  . NASAL SEPTOPLASTY W/ TURBINOPLASTY  08/03/2012   Procedure: NASAL SEPTOPLASTY WITH TURBINATE REDUCTION;  Surgeon: Izora Gala, MD;  Location: [facility];  Service: ENT;  Laterality: Bilateral;  . UPPER GASTROINTESTINAL ENDOSCOPY  08/2011    MEDICATIONS: . acetaminophen (TYLENOL) 500 MG tablet  . amLODipine (NORVASC) 5 MG tablet  . escitalopram (LEXAPRO) 10 MG tablet  . escitalopram (LEXAPRO) 20 MG tablet  . hydrocortisone 2.5 % cream  . LORazepam (ATIVAN) 0.5 MG tablet  . omeprazole (PRILOSEC) 40 MG capsule  . RA VITAMIN B12 2000 MCG TBCR  . terazosin (HYTRIN) 5 MG capsule   No current facility-administered medications for this encounter.    Maia Plan [facility] Pre-Surgical Testing [phone] 02/21/19 12:46 PM

## 2019-02-21 NOTE — Progress Notes (Signed)
Pt scheduled for surgery on 02-22-19. Preliminary EKG has not been reviewed. EKG lab contacted, area is currently experiencing backlog. EKG reviewed with Konrad Felix, PAC. Pt okay to proceed with surgery.

## 2019-02-21 NOTE — Anesthesia Preprocedure Evaluation (Addendum)
Anesthesia Evaluation  Patient identified by MRN, date of birth, ID band Patient awake    Reviewed: Allergy & Precautions, NPO status , Patient's Chart, lab work & pertinent test results  History of Anesthesia Complications Negative for: history of anesthetic complications  Airway Mallampati: II  TM Distance: >3 FB Neck ROM: Full    Dental  (+) Teeth Intact, Dental Advisory Given   Pulmonary sleep apnea , former smoker,    Pulmonary exam normal breath sounds clear to auscultation       Cardiovascular hypertension, Normal cardiovascular exam Rhythm:Regular Rate:Normal     Neuro/Psych Anxiety Depression negative neurological ROS     GI/Hepatic Neg liver ROS, hiatal hernia, GERD  Medicated and Controlled,  Endo/Other  negative endocrine ROS  Renal/GU negative Renal ROS   Enlarged prostate with urinary retention    Musculoskeletal  (+) Arthritis ,   Abdominal   Peds  Hematology negative hematology ROS (+)   Anesthesia Other Findings Day of surgery medications reviewed with the patient.  Reproductive/Obstetrics                           Anesthesia Physical Anesthesia Plan  ASA: II  Anesthesia Plan: General   Post-op Pain Management:    Induction: Intravenous, Rapid sequence and Cricoid pressure planned  PONV Risk Score and Plan: Treatment may vary due to age or medical condition, Ondansetron, Dexamethasone and Midazolam  Airway Management Planned: Oral ETT  Additional Equipment:   Intra-op Plan:   Post-operative Plan: Extubation in OR  Informed Consent: I have reviewed the patients History and Physical, chart, labs and discussed the procedure including the risks, benefits and alternatives for the proposed anesthesia with the patient or authorized representative who has indicated his/her understanding and acceptance.     Dental advisory given  Plan Discussed with:  CRNA  Anesthesia Plan Comments: (See PAT note 02/20/19, Konrad Felix, PA-C)      Anesthesia Quick Evaluation

## 2019-02-22 ENCOUNTER — Inpatient Hospital Stay (HOSPITAL_COMMUNITY): Payer: Medicare Other | Admitting: Anesthesiology

## 2019-02-22 ENCOUNTER — Encounter (HOSPITAL_COMMUNITY): Payer: Self-pay | Admitting: *Deleted

## 2019-02-22 ENCOUNTER — Encounter (HOSPITAL_COMMUNITY)
Admission: RE | Disposition: A | Discharge status: Home / Self Care | Payer: Self-pay | Source: Home / Self Care | Attending: Urology

## 2019-02-22 ENCOUNTER — Other Ambulatory Visit: Payer: Self-pay

## 2019-02-22 ENCOUNTER — Inpatient Hospital Stay (HOSPITAL_COMMUNITY)
Admission: RE | Admit: 2019-02-22 | Discharge: 2019-02-27 | DRG: 717 | Disposition: A | Discharge status: Home / Self Care | Payer: Medicare Other | Attending: Urology | Admitting: Urology

## 2019-02-22 DIAGNOSIS — Z87891 Personal history of nicotine dependence: Secondary | ICD-10-CM | POA: Diagnosis not present

## 2019-02-22 DIAGNOSIS — E785 Hyperlipidemia, unspecified: Secondary | ICD-10-CM | POA: Diagnosis present

## 2019-02-22 DIAGNOSIS — R7303 Prediabetes: Secondary | ICD-10-CM | POA: Diagnosis present

## 2019-02-22 DIAGNOSIS — Z79899 Other long term (current) drug therapy: Secondary | ICD-10-CM

## 2019-02-22 DIAGNOSIS — I959 Hypotension, unspecified: Secondary | ICD-10-CM | POA: Diagnosis not present

## 2019-02-22 DIAGNOSIS — Z86010 Personal history of colonic polyps: Secondary | ICD-10-CM | POA: Diagnosis not present

## 2019-02-22 DIAGNOSIS — D62 Acute posthemorrhagic anemia: Secondary | ICD-10-CM | POA: Diagnosis not present

## 2019-02-22 DIAGNOSIS — F329 Major depressive disorder, single episode, unspecified: Secondary | ICD-10-CM | POA: Diagnosis present

## 2019-02-22 DIAGNOSIS — R339 Retention of urine, unspecified: Secondary | ICD-10-CM | POA: Diagnosis not present

## 2019-02-22 DIAGNOSIS — N179 Acute kidney failure, unspecified: Secondary | ICD-10-CM | POA: Diagnosis not present

## 2019-02-22 DIAGNOSIS — K219 Gastro-esophageal reflux disease without esophagitis: Secondary | ICD-10-CM | POA: Diagnosis present

## 2019-02-22 DIAGNOSIS — Z8249 Family history of ischemic heart disease and other diseases of the circulatory system: Secondary | ICD-10-CM

## 2019-02-22 DIAGNOSIS — N138 Other obstructive and reflux uropathy: Secondary | ICD-10-CM | POA: Diagnosis present

## 2019-02-22 DIAGNOSIS — I10 Essential (primary) hypertension: Secondary | ICD-10-CM | POA: Diagnosis present

## 2019-02-22 DIAGNOSIS — F419 Anxiety disorder, unspecified: Secondary | ICD-10-CM | POA: Diagnosis present

## 2019-02-22 DIAGNOSIS — R338 Other retention of urine: Secondary | ICD-10-CM | POA: Diagnosis present

## 2019-02-22 DIAGNOSIS — D509 Iron deficiency anemia, unspecified: Secondary | ICD-10-CM | POA: Diagnosis present

## 2019-02-22 DIAGNOSIS — N401 Enlarged prostate with lower urinary tract symptoms: Principal | ICD-10-CM | POA: Diagnosis present

## 2019-02-22 DIAGNOSIS — N281 Cyst of kidney, acquired: Secondary | ICD-10-CM | POA: Diagnosis present

## 2019-02-22 DIAGNOSIS — N210 Calculus in bladder: Secondary | ICD-10-CM | POA: Diagnosis present

## 2019-02-22 DIAGNOSIS — N3289 Other specified disorders of bladder: Secondary | ICD-10-CM | POA: Diagnosis not present

## 2019-02-22 DIAGNOSIS — T884XXA Failed or difficult intubation, initial encounter: Secondary | ICD-10-CM

## 2019-02-22 HISTORY — DX: Failed or difficult intubation, initial encounter: T88.4XXA

## 2019-02-22 HISTORY — PX: XI ROBOTIC ASSISTED SIMPLE PROSTATECTOMY: SHX6713

## 2019-02-22 LAB — HEMOGLOBIN AND HEMATOCRIT, BLOOD
HCT: 34.1 % — ABNORMAL LOW (ref 39.0–52.0)
HCT: 29.1 % — ABNORMAL LOW (ref 39.0–52.0)
Hemoglobin: 10.5 g/dL — ABNORMAL LOW (ref 13.0–17.0)
Hemoglobin: 9.4 g/dL — ABNORMAL LOW (ref 13.0–17.0)

## 2019-02-22 LAB — PREPARE RBC (CROSSMATCH)

## 2019-02-22 SURGERY — PROSTATECTOMY, SIMPLE, ROBOT-ASSISTED
Anesthesia: General

## 2019-02-22 MED ORDER — SUCCINYLCHOLINE CHLORIDE 200 MG/10ML IV SOSY
PREFILLED_SYRINGE | INTRAVENOUS | Status: DC | PRN
  Administered 2019-02-22: 100 mg via INTRAVENOUS

## 2019-02-22 MED ORDER — HYDROMORPHONE HCL 1 MG/ML IJ SOLN
INTRAMUSCULAR | Status: AC
  Administered 2019-02-22: 0.5 mg via INTRAVENOUS
  Filled 2019-02-22: qty 1

## 2019-02-22 MED ORDER — PANTOPRAZOLE SODIUM 40 MG PO TBEC
80.0000 mg | DELAYED_RELEASE_TABLET | Freq: Every day | ORAL | Status: DC
  Administered 2019-02-23 – 2019-02-27 (×5): 80 mg via ORAL
  Filled 2019-02-22 (×5): qty 2

## 2019-02-22 MED ORDER — ONDANSETRON HCL 4 MG/2ML IJ SOLN
INTRAMUSCULAR | Status: AC
  Filled 2019-02-22: qty 2

## 2019-02-22 MED ORDER — LACTATED RINGERS IR SOLN
Status: DC | PRN
  Administered 2019-02-22: 1

## 2019-02-22 MED ORDER — SODIUM CHLORIDE 0.9% IV SOLUTION
Freq: Once | INTRAVENOUS | Status: AC
  Administered 2019-02-25: 15:00:00 via INTRAVENOUS

## 2019-02-22 MED ORDER — BELLADONNA ALKALOIDS-OPIUM 16.2-60 MG RE SUPP
1.0000 | Freq: Four times a day (QID) | RECTAL | Status: DC | PRN
  Administered 2019-02-23: 1 via RECTAL
  Filled 2019-02-22: qty 1

## 2019-02-22 MED ORDER — LACTATED RINGERS IV SOLN
INTRAVENOUS | Status: DC
  Administered 2019-02-22 (×2): via INTRAVENOUS

## 2019-02-22 MED ORDER — STERILE WATER FOR IRRIGATION IR SOLN
Status: DC | PRN
  Administered 2019-02-22: 1000 mL

## 2019-02-22 MED ORDER — FENTANYL CITRATE (PF) 100 MCG/2ML IJ SOLN
INTRAMUSCULAR | Status: AC
  Administered 2019-02-22: 25 ug via INTRAVENOUS
  Filled 2019-02-22: qty 2

## 2019-02-22 MED ORDER — ONDANSETRON HCL 4 MG/2ML IJ SOLN
INTRAMUSCULAR | Status: DC | PRN
  Administered 2019-02-22: 4 mg via INTRAVENOUS

## 2019-02-22 MED ORDER — ONDANSETRON HCL 4 MG/2ML IJ SOLN: 4.0000 mg | INTRAMUSCULAR | Status: DC | PRN

## 2019-02-22 MED ORDER — HYDROCODONE-ACETAMINOPHEN 5-325 MG PO TABS: 1.0000 | ORAL_TABLET | Freq: Four times a day (QID) | ORAL | 0 refills | Status: DC | PRN

## 2019-02-22 MED ORDER — HYDROMORPHONE HCL 1 MG/ML IJ SOLN
0.5000 mg | INTRAMUSCULAR | Status: DC | PRN
  Administered 2019-02-22 – 2019-02-23 (×2): 1 mg via INTRAVENOUS
  Filled 2019-02-22 (×3): qty 1

## 2019-02-22 MED ORDER — BUPIVACAINE LIPOSOME 1.3 % IJ SUSP
20.0000 mL | Freq: Once | INTRAMUSCULAR | Status: AC
  Administered 2019-02-22: 20 mL
  Filled 2019-02-22 (×2): qty 20

## 2019-02-22 MED ORDER — SULFAMETHOXAZOLE-TRIMETHOPRIM 800-160 MG PO TABS: 1.0000 | ORAL_TABLET | Freq: Two times a day (BID) | ORAL | 0 refills | Status: DC

## 2019-02-22 MED ORDER — BACITRACIN-NEOMYCIN-POLYMYXIN 400-5-5000 EX OINT: 1.0000 "application " | TOPICAL_OINTMENT | Freq: Three times a day (TID) | CUTANEOUS | Status: DC | PRN

## 2019-02-22 MED ORDER — DIPHENHYDRAMINE HCL 12.5 MG/5ML PO ELIX: 12.5000 mg | ORAL_SOLUTION | Freq: Four times a day (QID) | ORAL | Status: DC | PRN

## 2019-02-22 MED ORDER — DIPHENHYDRAMINE HCL 50 MG/ML IJ SOLN: 12.5000 mg | Freq: Four times a day (QID) | INTRAMUSCULAR | Status: DC | PRN

## 2019-02-22 MED ORDER — OXYCODONE HCL 5 MG/5ML PO SOLN: 5.0000 mg | Freq: Once | ORAL | Status: DC | PRN

## 2019-02-22 MED ORDER — PROPOFOL 10 MG/ML IV BOLUS: INTRAVENOUS | Status: DC | PRN

## 2019-02-22 MED ORDER — FENTANYL CITRATE (PF) 250 MCG/5ML IJ SOLN
INTRAMUSCULAR | Status: DC | PRN
  Administered 2019-02-22 (×5): 50 ug via INTRAVENOUS

## 2019-02-22 MED ORDER — DEXAMETHASONE SODIUM PHOSPHATE 10 MG/ML IJ SOLN
INTRAMUSCULAR | Status: DC | PRN
  Administered 2019-02-22: 10 mg via INTRAVENOUS

## 2019-02-22 MED ORDER — AMLODIPINE BESYLATE 5 MG PO TABS
5.0000 mg | ORAL_TABLET | Freq: Every day | ORAL | Status: DC
  Administered 2019-02-24 – 2019-02-27 (×4): 5 mg via ORAL
  Filled 2019-02-22 (×5): qty 1

## 2019-02-22 MED ORDER — FENTANYL CITRATE (PF) 250 MCG/5ML IJ SOLN
INTRAMUSCULAR | Status: AC
  Filled 2019-02-22: qty 5

## 2019-02-22 MED ORDER — CEFAZOLIN SODIUM-DEXTROSE 2-4 GM/100ML-% IV SOLN
INTRAVENOUS | Status: AC
  Filled 2019-02-22: qty 100

## 2019-02-22 MED ORDER — SUCCINYLCHOLINE CHLORIDE 200 MG/10ML IV SOSY
PREFILLED_SYRINGE | INTRAVENOUS | Status: AC
  Filled 2019-02-22: qty 10

## 2019-02-22 MED ORDER — FENTANYL CITRATE (PF) 100 MCG/2ML IJ SOLN
25.0000 ug | INTRAMUSCULAR | Status: DC | PRN
  Administered 2019-02-22 (×2): 25 ug via INTRAVENOUS
  Administered 2019-02-22: 50 ug via INTRAVENOUS
  Administered 2019-02-22: 25 ug via INTRAVENOUS

## 2019-02-22 MED ORDER — ALBUMIN HUMAN 5 % IV SOLN
12.5000 g | Freq: Once | INTRAVENOUS | Status: AC
  Administered 2019-02-22: 16:00:00 12.5 g via INTRAVENOUS

## 2019-02-22 MED ORDER — DEXTROSE-NACL 5-0.45 % IV SOLN
INTRAVENOUS | Status: DC
  Administered 2019-02-22: 21:00:00 via INTRAVENOUS
  Administered 2019-02-22: 1000 mL via INTRAVENOUS

## 2019-02-22 MED ORDER — PROPOFOL 10 MG/ML IV BOLUS
INTRAVENOUS | Status: AC
  Filled 2019-02-22: qty 20

## 2019-02-22 MED ORDER — DEXTROSE-NACL 5-0.45 % IV SOLN
INTRAVENOUS | Status: DC
  Administered 2019-02-22 – 2019-02-25 (×7): via INTRAVENOUS

## 2019-02-22 MED ORDER — PROPOFOL 10 MG/ML IV BOLUS
INTRAVENOUS | Status: DC | PRN
  Administered 2019-02-22: 100 mg via INTRAVENOUS

## 2019-02-22 MED ORDER — ALBUMIN HUMAN 5 % IV SOLN
INTRAVENOUS | Status: AC
  Administered 2019-02-22: 12.5 g via INTRAVENOUS
  Filled 2019-02-22: qty 250

## 2019-02-22 MED ORDER — ESCITALOPRAM OXALATE 10 MG PO TABS
30.0000 mg | ORAL_TABLET | Freq: Every day | ORAL | Status: DC
  Administered 2019-02-23 – 2019-02-27 (×5): 30 mg via ORAL
  Filled 2019-02-22 (×6): qty 3

## 2019-02-22 MED ORDER — ESCITALOPRAM OXALATE 20 MG PO TABS: 20.0000 mg | ORAL_TABLET | Freq: Every evening | ORAL | Status: DC

## 2019-02-22 MED ORDER — DEXAMETHASONE SODIUM PHOSPHATE 10 MG/ML IJ SOLN
INTRAMUSCULAR | Status: AC
  Filled 2019-02-22: qty 1

## 2019-02-22 MED ORDER — DOCUSATE SODIUM 100 MG PO CAPS
100.0000 mg | ORAL_CAPSULE | Freq: Two times a day (BID) | ORAL | Status: DC
  Administered 2019-02-23 – 2019-02-27 (×9): 100 mg via ORAL
  Filled 2019-02-22 (×10): qty 1

## 2019-02-22 MED ORDER — OXYCODONE HCL 5 MG PO TABS: 5.0000 mg | ORAL_TABLET | ORAL | Status: DC | PRN

## 2019-02-22 MED ORDER — SUGAMMADEX SODIUM 200 MG/2ML IV SOLN
INTRAVENOUS | Status: AC
  Filled 2019-02-22: qty 2

## 2019-02-22 MED ORDER — SODIUM CHLORIDE (PF) 0.9 % IJ SOLN
INTRAMUSCULAR | Status: DC | PRN
  Administered 2019-02-22: 20 mL

## 2019-02-22 MED ORDER — HYDROMORPHONE HCL 1 MG/ML IJ SOLN
0.2500 mg | INTRAMUSCULAR | Status: DC | PRN
  Administered 2019-02-22 (×2): 0.5 mg via INTRAVENOUS

## 2019-02-22 MED ORDER — LORAZEPAM 0.5 MG PO TABS: 0.5000 mg | ORAL_TABLET | Freq: Every day | ORAL | Status: DC | PRN

## 2019-02-22 MED ORDER — ROCURONIUM BROMIDE 10 MG/ML (PF) SYRINGE
PREFILLED_SYRINGE | INTRAVENOUS | Status: AC
  Filled 2019-02-22: qty 10

## 2019-02-22 MED ORDER — FENTANYL CITRATE (PF) 100 MCG/2ML IJ SOLN
INTRAMUSCULAR | Status: AC
  Administered 2019-02-22: 12:00:00 25 ug via INTRAVENOUS
  Filled 2019-02-22: qty 2

## 2019-02-22 MED ORDER — ACETAMINOPHEN 500 MG PO TABS
1000.0000 mg | ORAL_TABLET | Freq: Four times a day (QID) | ORAL | Status: AC
  Administered 2019-02-22 – 2019-02-23 (×2): 1000 mg via ORAL
  Filled 2019-02-22 (×2): qty 2

## 2019-02-22 MED ORDER — LIDOCAINE 2% (20 MG/ML) 5 ML SYRINGE
INTRAMUSCULAR | Status: DC | PRN
  Administered 2019-02-22: 60 mg via INTRAVENOUS

## 2019-02-22 MED ORDER — SODIUM CHLORIDE 0.9 % IV BOLUS
1000.0000 mL | Freq: Once | INTRAVENOUS | Status: AC
  Administered 2019-02-22: 10:00:00 1000 mL via INTRAVENOUS

## 2019-02-22 MED ORDER — ALBUMIN HUMAN 5 % IV SOLN
INTRAVENOUS | Status: AC
  Filled 2019-02-22: qty 250

## 2019-02-22 MED ORDER — ROCURONIUM BROMIDE 10 MG/ML (PF) SYRINGE
PREFILLED_SYRINGE | INTRAVENOUS | Status: DC | PRN
  Administered 2019-02-22: 5 mg via INTRAVENOUS
  Administered 2019-02-22: 50 mg via INTRAVENOUS

## 2019-02-22 MED ORDER — ALBUMIN HUMAN 5 % IV SOLN
12.5000 g | Freq: Once | INTRAVENOUS | Status: AC
  Administered 2019-02-22: 13:00:00 12.5 g via INTRAVENOUS

## 2019-02-22 MED ORDER — SUGAMMADEX SODIUM 200 MG/2ML IV SOLN
INTRAVENOUS | Status: DC | PRN
  Administered 2019-02-22: 150 mg via INTRAVENOUS

## 2019-02-22 MED ORDER — OXYCODONE HCL 5 MG PO TABS: 5.0000 mg | ORAL_TABLET | Freq: Once | ORAL | Status: DC | PRN

## 2019-02-22 MED ORDER — ACETAMINOPHEN 10 MG/ML IV SOLN: 1000.0000 mg | Freq: Once | INTRAVENOUS | Status: DC | PRN

## 2019-02-22 MED ORDER — CEFAZOLIN SODIUM-DEXTROSE 2-4 GM/100ML-% IV SOLN
2.0000 g | INTRAVENOUS | Status: AC
  Administered 2019-02-22: 2 g via INTRAVENOUS

## 2019-02-22 MED ORDER — SODIUM CHLORIDE (PF) 0.9 % IJ SOLN
INTRAMUSCULAR | Status: AC
  Filled 2019-02-22: qty 20

## 2019-02-22 MED ORDER — LIDOCAINE 2% (20 MG/ML) 5 ML SYRINGE
INTRAMUSCULAR | Status: AC
  Filled 2019-02-22: qty 5

## 2019-02-22 SURGICAL SUPPLY — 68 items
ADH SKN CLS APL DERMABOND .7 (GAUZE/BANDAGES/DRESSINGS) ×1
APL PRP STRL LF DISP 70% ISPRP (MISCELLANEOUS) ×1
APL SWBSTK 6 STRL LF DISP (MISCELLANEOUS) ×1
APPLICATOR COTTON TIP 6 STRL (MISCELLANEOUS) ×1 IMPLANT
APPLICATOR COTTON TIP 6IN STRL (MISCELLANEOUS) ×2
CATH FOLEY 2WAY SLVR 18FR 30CC (CATHETERS) ×2 IMPLANT
CATH FOLEY 3WAY 30CC 22FR (CATHETERS) ×1 IMPLANT
CATH TIEMANN FOLEY 18FR 5CC (CATHETERS) ×1 IMPLANT
CHLORAPREP W/TINT 26 (MISCELLANEOUS) ×2 IMPLANT
CLIP VESOLOCK LG 6/CT PURPLE (CLIP) IMPLANT
CLOTH BEACON ORANGE TIMEOUT ST (SAFETY) ×2 IMPLANT
COVER SURGICAL LIGHT HANDLE (MISCELLANEOUS) ×2 IMPLANT
COVER TIP SHEARS 8 DVNC (MISCELLANEOUS) ×1 IMPLANT
COVER TIP SHEARS 8MM DA VINCI (MISCELLANEOUS) ×1
COVER WAND RF STERILE (DRAPES) IMPLANT
CUTTER ECHEON FLEX ENDO 45 340 (ENDOMECHANICALS) IMPLANT
DECANTER SPIKE VIAL GLASS SM (MISCELLANEOUS) ×3 IMPLANT
DERMABOND ADVANCED (GAUZE/BANDAGES/DRESSINGS) ×1
DERMABOND ADVANCED .7 DNX12 (GAUZE/BANDAGES/DRESSINGS) ×1 IMPLANT
DRAPE ARM DVNC X/XI (DISPOSABLE) ×4 IMPLANT
DRAPE COLUMN DVNC XI (DISPOSABLE) ×1 IMPLANT
DRAPE DA VINCI XI ARM (DISPOSABLE) ×4
DRAPE DA VINCI XI COLUMN (DISPOSABLE) ×1
DRAPE SURG IRRIG POUCH 19X23 (DRAPES) ×2 IMPLANT
DRSG TEGADERM 4X4.75 (GAUZE/BANDAGES/DRESSINGS) ×3 IMPLANT
ELECT REM PT RETURN 15FT ADLT (MISCELLANEOUS) ×2 IMPLANT
GAUZE SPONGE 2X2 8PLY STRL LF (GAUZE/BANDAGES/DRESSINGS) ×1 IMPLANT
GLOVE BIO SURGEON STRL SZ 6.5 (GLOVE) ×2 IMPLANT
GLOVE BIOGEL M STRL SZ7.5 (GLOVE) ×4 IMPLANT
GLOVE BIOGEL PI IND STRL 7.5 (GLOVE) ×1 IMPLANT
GLOVE BIOGEL PI INDICATOR 7.5 (GLOVE) ×1
GOWN STRL REUS W/TWL LRG LVL3 (GOWN DISPOSABLE) ×6 IMPLANT
HOLDER FOLEY CATH W/STRAP (MISCELLANEOUS) ×2 IMPLANT
IRRIG SUCT STRYKERFLOW 2 WTIP (MISCELLANEOUS) ×2
IRRIGATION SUCT STRKRFLW 2 WTP (MISCELLANEOUS) ×1 IMPLANT
IV LACTATED RINGERS 1000ML (IV SOLUTION) ×2 IMPLANT
KIT TURNOVER KIT A (KITS) IMPLANT
NDL INSUFFLATION 14GA 120MM (NEEDLE) ×1 IMPLANT
NEEDLE INSUFFLATION 14GA 120MM (NEEDLE) ×2 IMPLANT
PACK ROBOT UROLOGY CUSTOM (CUSTOM PROCEDURE TRAY) ×2 IMPLANT
PAD POSITIONING PINK XL (MISCELLANEOUS) ×2 IMPLANT
PORT ACCESS TROCAR AIRSEAL 12 (TROCAR) ×1 IMPLANT
PORT ACCESS TROCAR AIRSEAL 5M (TROCAR) ×1
RELOAD STAPLE 45 4.1 GRN THCK (STAPLE) IMPLANT
SEAL CANN UNIV 5-8 DVNC XI (MISCELLANEOUS) ×4 IMPLANT
SEAL XI 5MM-8MM UNIVERSAL (MISCELLANEOUS) ×4
SET TRI-LUMEN FLTR TB AIRSEAL (TUBING) ×2 IMPLANT
SOLUTION ELECTROLUBE (MISCELLANEOUS) ×2 IMPLANT
SPONGE GAUZE 2X2 STER 10/PKG (GAUZE/BANDAGES/DRESSINGS) ×1
SPONGE LAP 4X18 RFD (DISPOSABLE) ×2 IMPLANT
STAPLE RELOAD 45 GRN (STAPLE) IMPLANT
STAPLE RELOAD 45MM GREEN (STAPLE)
SUT ETHILON 3 0 PS 1 (SUTURE) ×2 IMPLANT
SUT MNCRL AB 4-0 PS2 18 (SUTURE) ×4 IMPLANT
SUT PDS AB 1 CT1 27 (SUTURE) ×4 IMPLANT
SUT V-LOC BARB 180 2/0GR6 GS22 (SUTURE) ×6
SUT VIC AB 0 CT1 27 (SUTURE) ×6
SUT VIC AB 0 CT1 27XBRD ANTBC (SUTURE) ×3 IMPLANT
SUT VIC AB 2-0 SH 27 (SUTURE) ×2
SUT VIC AB 2-0 SH 27X BRD (SUTURE) ×1 IMPLANT
SUT VICRYL 0 UR6 27IN ABS (SUTURE) ×2 IMPLANT
SUT VLOC BARB 180 ABS3/0GR12 (SUTURE) ×4
SUTURE V-LC BRB 180 2/0GR6GS22 (SUTURE) ×2 IMPLANT
SUTURE VLOC BRB 180 ABS3/0GR12 (SUTURE) ×2 IMPLANT
TOWEL OR 17X26 10 PK STRL BLUE (TOWEL DISPOSABLE) ×2 IMPLANT
TOWEL OR NON WOVEN STRL DISP B (DISPOSABLE) ×2 IMPLANT
TROCAR BLADELESS OPT 5 100 (ENDOMECHANICALS) IMPLANT
WATER STERILE IRR 1000ML POUR (IV SOLUTION) ×2 IMPLANT

## 2019-02-22 NOTE — Discharge Instructions (Signed)

## 2019-02-22 NOTE — Anesthesia Postprocedure Evaluation (Signed)
Anesthesia Post Note  Patient: [name]  Procedure(s) Performed: XI ROBOTIC ASSISTED SIMPLE PROSTATECTOMY (N/A )     Patient location during evaluation: PACU Anesthesia Type: General Level of consciousness: awake and alert Pain management: pain level controlled Vital Signs Assessment: post-procedure vital signs reviewed and stable Respiratory status: spontaneous breathing, nonlabored ventilation and respiratory function stable Cardiovascular status: blood pressure returned to baseline and stable Postop Assessment: no apparent nausea or vomiting Anesthetic complications: no               Brennan Bailey

## 2019-02-22 NOTE — Progress Notes (Signed)
Vitals: 98.73F;92;RR12;93/65  (MAP 73);99 % on 2 L/min O2. Follow-up call placed to PCP on call.

## 2019-02-22 NOTE — Brief Op Note (Signed)
02/22/2019  10:03 AM  PATIENT:  [name]  83 y.o. male  PRE-OPERATIVE DIAGNOSIS:  MASSIVE PROSTATE WITH URINARY RETENTION  POST-OPERATIVE DIAGNOSIS:  MASSIVE PROSTATE WITH URINARY RETENTION  PROCEDURE:  Procedure(s): XI ROBOTIC ASSISTED SIMPLE PROSTATECTOMY (N/A)  SURGEON:  Surgeon(s) and Role:    * Alexis Frock, MD - Primary  PHYSICIAN ASSISTANT:   ASSISTANTS: Clemetine Marker PA   ANESTHESIA:   local and general  EBL:  200 mL   BLOOD ADMINISTERED:none  DRAINS: 1 - JP to bulb; 2 - Foley to gravity (irrigation port plugged)   LOCAL MEDICATIONS USED:  MARCAINE     SPECIMEN:  Source of Specimen:  prostate adenoma; peri-prostatic fat  DISPOSITION OF SPECIMEN:  PATHOLOGY  COUNTS:  YES  TOURNIQUET:  * No tourniquets in log *  DICTATION: .Other Dictation: Dictation Number  [id number]  PLAN OF CARE: Admit for overnight observation  PATIENT DISPOSITION:  PACU - hemodynamically stable.   Delay start of Pharmacological VTE agent (>24hrs) due to surgical blood loss or risk of bleeding: yes

## 2019-02-22 NOTE — Progress Notes (Signed)
Patient's vitals: 98.14F;HR92;RR12;79/53;100% on 2 L . Hand irrigated and got small clots. Patient is c/o bladder pain. Rates pain 8/10. Has constant pain. PCP answering service was notified. Awaiting a call back.

## 2019-02-22 NOTE — Transfer of Care (Signed)
Immediate Anesthesia Transfer of Care Note  Patient: AMORE GRATER  Procedure(s) Performed: XI ROBOTIC ASSISTED SIMPLE PROSTATECTOMY (N/A )  Patient Location: PACU  Anesthesia Type:General  Level of Consciousness: awake and alert   Airway & Oxygen Therapy: Patient Spontanous Breathing and Patient connected to face mask oxygen  Post-op Assessment: Report given to RN and Post -op Vital signs reviewed and stable  Post vital signs: Reviewed and stable  Last Vitals:  Vitals Value Taken Time  BP 136/61 02/22/2019 10:18 AM  Temp    Pulse 169 02/22/2019 10:18 AM  Resp 21 02/22/2019 10:19 AM  SpO2 83 % 02/22/2019 10:18 AM  Vitals shown include unvalidated device data.  Last Pain: There were no vitals filed for this visit.       Complications: No apparent anesthesia complications

## 2019-02-22 NOTE — Anesthesia Procedure Notes (Signed)
Procedure Name: Intubation Date/Time: 02/22/2019 7:35 AM Performed by: Sharlette Dense, CRNA Patient Re-evaluated:Patient Re-evaluated prior to induction Oxygen Delivery Method: Circle system utilized Preoxygenation: Pre-oxygenation with 100% oxygen Induction Type: Rapid sequence Laryngoscope Size: Glidescope Grade View: Grade II Tube type: Parker flex tip Tube size: 7.5 mm Number of attempts: 1 Airway Equipment and Method: Video-laryngoscopy Placement Confirmation: ETT inserted through vocal cords under direct vision,  positive ETCO2 and breath sounds checked- equal and bilateral Secured at: 23 cm Tube secured with: Tape Dental Injury: Injury to lip  Difficulty Due To: Difficulty was anticipated, Difficult Airway- due to anterior larynx and Difficult Airway- due to limited oral opening

## 2019-02-22 NOTE — Progress Notes (Signed)
Post OP Check:  S: POD 0 s/p simple prostatectomy. Uncomplicated with excellent hemostasis intra-op.  Observed in PACU most of day as urine still bloody.  O: NAD, AOx3, Pain controlled HR low 80s, SBP 110 Hgb 9.5  Irrigated foley to clear and placed on light traction (cathetet straps) and NS irrigation. Improved.   A/P:  OK for transfer to med surg floor. T+C blood to have avail. Updated pt and family including that he may require transfusion before DC and they voiced understanding.

## 2019-02-22 NOTE — Progress Notes (Signed)
Hand irrigated foley, there were 2 x small clots.

## 2019-02-22 NOTE — Op Note (Signed)
NAME: Jack, Ward MEDICAL RECORD [id number] ACCOUNT [id number] DATE OF BIRTH:Sep 06, 1936 FACILITY: [facility] LOCATION: [facility] PHYSICIAN:Jack Hanger Tresa Moore, MD  OPERATIVE REPORT  DATE OF PROCEDURE:  02/22/2019  PREOPERATIVE DIAGNOSIS:  Massive prostate with recurrent urinary retention.  POSTOPERATIVE DIAGNOSES:     1.  Massive prostate with recurrent urinary retention. 2.  Bladder stones.  PROCEDURES: 1.  Robotic-assisted laparoscopic simple prostatectomy. 2.  Cystolitholapaxy.  ESTIMATED BLOOD LOSS:  952 mL  COMPLICATIONS:  None.  SPECIMENS: 1.  Bladder stones for gross identification only. 2.  Prostate adenoma for permanent pathology.  FINDINGS: 1.  Approximately 2 cm volume, very soft bladder stone. 2.  Very large, mostly bilobar, prostatic hypertrophy pre-resection of adenoma.   3.  There was complete resolution of all obstructing adenoma tissue following laser prostatectomy.  ASSISTANT:  Jack Marker, PA  DRAINS: 1.  Jackson-Pratt drain bulb suction. 2.  Foley catheter to straight drain.  Foley catheter 3-way type.  Irrigation port was supplied.  INDICATIONS:  The patient is a very pleasant 83 year old gentleman with longstanding history of known very large prostate with progressive lower urinary tract symptoms.  This is despite maximal medical therapy.  He has now been catheter dependent for  some time.  He continues to have problems with his catheter requiring multiple healthcare contacts including frequent office visits and emergency room visits for plugging with debris.  We discussed for management including chronic catheterization versus  staged transurethral resection versus simple prostatectomy, the latter being most definitive and he wished to proceed.  We discussed specifically preoperatively that this surgery could technically be postponed based on histologic indication.  However, he  is at wits end with the frequent healthcare contacts and this certainly is  also significant risk in and of itself and wished to proceed  and agreed, this is favorable risk/benefit profile overall.  Informed consent was signed and placed in medical  record.  DESCRIPTION OF PROCEDURE:  Patient being [name], verified.  Procedure being simple prostatectomy was confirmed.  Procedure timeout was performed.  Intravenous antibiotics administered.  General endotracheal anesthesia induced.  The patient was  placed into a low lithotomy position.  Sterile field was created by prepping and draping the base of the penis, perineum and proximal thighs using iodine and his infra-xiphoid abdomen using chlorhexidine gluconate after clipper shaving.  He was further  fastened to operative table using 3-inch tape over foam padding across the supraxiphoid chest.  A test of steep Trendelenburg positioning was performed ad he was stably positioned.  Foley catheter was placed to straight drain.  Next, a high-flow,  low-pressure pneumoperitoneum was obtained using Veress technique in the supraumbilical midline and passed the aspiration and drop test.  An 8 mm robotic camera port was then placed in the same location.  Laparoscopic examination of the peritoneal cavity  revealed some loose omental adhesions in the right lower quadrant, otherwise unremarkable.  Additional ports were placed as follows:  Right paramedian 8 mm robotic port, right upper lateral 12 mm AirSeal assist port, right paramedian 5 mm suction port,  left paramedian 8 mm robotic port, left far lateral 8 mm robotic port.  Robot was docked and passed the electronic checks.    Initial attention was directed at limited adhesiolysis.  Loose omental adhesions were taken out of the area of the right lower quadrant.  This was closed.  The area of the appendix revealed  likely consistent with prior appendicitis.  There is no  evidence of active infection in this area  whatsoever at the present time.  Attention was directed at developing the  space of Retzius.  Incision was made lateral to right medial umbilical ligament from the midline towards the area of the internal ring.   The vas deferens was spared on the right side.  A mirror image dissection was performed on the left side.  Anterior attachments were taken down using cautery scissors to expose the anterior base of the prostate, which was defatted to better demarcate the  bladder neck and prostate junction.  This was set aside and labeled as periprosthetic fat.  Next, the endopelvic fascia was swept away from the lateral aspect of the prostate just at the area of the apex just enough to expose the dorsal venous complex  which  was controlled using vascular stapler which revealed an excellent hemostatic control of the structure.  Exquisite care was taken to avoid membranous urethral injury which did not occur.  Next, the true bladder neck was identified.  An incision was  made approximately 2 cm proximal to this, thus leaving a rim of bladder tissue with the prostate in an inverted.  A semilunar incision was made to approximately the 3 o'clock and 9 o'clock positions of the bladder neck.  This exposed the very large  bilobar prostatic hypertrophy.  The in situ Foley catheter was placed on gentle superior traction.  There was a minimal median lobe.  In the cul-de-sac posterior to the prostate, there was an approximately 2 cm worth of soft bladder stones.  These were  removed using laparoscopic specimen retrieval device. The trigone was identified including bilateral ureteral orifices and posterior incision was made approximately 1 cm distal to this.  Through the bladder mucosa, musculature and entering the adenoma  plane posteriorly which was carefully developed all the way to the apex of the prostate.  Four large stay sutures of 0 Vicryl were applied, 2 on the right, 2 on the left to help with adenoma manipulation.  This adenoma dissection was then swept to the  right side laterally and  then left side laterally taking care to avoid penetration of the prostate capsule, which did not occur.  Apical dissection was performed also in the adenoma plane down distal as safely possible and then a butterfly type incision  was made at the anterior prostate down to the true apex, which was then carefully excised.  This completely freed up the large adenoma specimen which was placed in an EndoCatch bag for later retrieval.  Digital rectal exam was then performed using a  glove.  No evidence of rectal violation was noted.  Hemostasis was quite excellent.  There is no evidence of obvious prostatic capsule perforation.  Attention was directed at posterior bladder neck advancement.  A 3-0 V-Loc suture was used to perform  approximately 50% circumference urethra to bladder neck anastomosis which brought the bladder neck inferiorly in contact with the membranous urethral mucosa from the 3 o'clock to the 9 o'clock position.  The cystotomy was then closed using 2 separate  running suture lines of 0 Vicryl from the 3 o'clock to  12 o'clock into the 9 o'clock to 12 o'clock positions respectively.  A new 22-French 3-way Foley catheter was then easily placed,  20 mL sterile water in the balloon.  This irrigated multiple times  quantitatively.  Closed suction drain was brought the previous left lateral most robotic port site near the peritoneal cavity.  Robot was then undocked.  The previous 12 mm assistant port site was  closed in the fascia using Carter-Thomason suture passer  and 0 Vicryl.  Specimen was retrieved by extending the previous camera port site superiorly for a distance of approximately 3 cm, removing the adenoma specimens and sent to pathology.  Extraction site was closed with fascia using figure-of-eight PDS x3,  followed by reapproximation of Scarpa's with a running Vicryl.  All incision sites were infiltrated with dilute lipolyzed Marcaine and closed at the level of the skin using subcuticular  Monocryl and Dermabond.  Procedure terminated.    The patient tolerated the procedure well.  No immediate complications.  The patient taken to [facility] in stable condition.  Please note, first assistant, Debbrah Alar, was crucial for all portions of the surgery today.  She provided invaluable first assistance, specimen manipulation, retraction, suctioning,  vascular stapling.  AN/NUANCE  D:02/22/2019 T:02/22/2019 JOB:[id number]

## 2019-02-22 NOTE — Progress Notes (Signed)
PCP returned call. Rate change increased to 125 ml/hr.

## 2019-02-22 NOTE — H&P (Signed)
[name] is an 83 y.o. male.    Chief Complaint: Pre-Op Simple Prostatectomy  HPI:   1 - Lower Urinary Tract Symptoms / Urinary Retention- on terazosin x years for obstructive symptoms. . PVR's 100-258mL range. ONe episode retention years ago before alpha blockers. Prostate vol 157mL by [name] 2016.   Recent Course:  12/2018 - PVR 332mL, DRE 80gm+ ==> start finasteride but did not tollerate  12/2018 - recurrent retention, foley dependant   2 - Rule Out Bladder Mass - Possible bladder lesion incidental on spine mRI 03/2017 for L spine disc disease. Non smoker. No hematuria. Cysto 05/2017 with protrusion of prostate median / lateral lobes into bladder, NO intraluminal bladder lesions whatsoever.   3 - Large Right Renal Cyst - 9cm Rt lower pole renal cyst incidental on MRI 2008. No solid areas. Stable by CT 2020.   PMH sig for Lumbago, NO ischemic CV disease. NO blood thinners. His PCP is Carolann Littler MD.   Today "[name]" is seen to proceed with robotic simple prostatecotmy. He continues to have multiple ER and office visits due to catheter problems. No interval fevers. Most recent UCX non-clonal.   Past Medical History:  Diagnosis Date  . Anxiety   . BPH (benign prostatic hypertrophy)   . DDD (degenerative disc disease), lumbar 2002  . Depression   . GERD (gastroesophageal reflux disease)   . History of colon polyps   . History of hiatal hernia 2012  . Hyperlipemia    Mild  . Hypertension   . Indwelling Foley catheter present   . Iron deficiency anemia    IV iron therapy   . Pre-diabetes   . Renal cyst 2008   7.6 cm lower pole right renal cyst  . Sleep apnea    slight, no CPAP used    Past Surgical History:  Procedure Laterality Date  . COLONOSCOPY  08/2011  . NASAL SEPTOPLASTY W/ TURBINOPLASTY  08/03/2012   Procedure: NASAL SEPTOPLASTY WITH TURBINATE REDUCTION;  Surgeon: Izora Gala, MD;  Location: [facility];  Service: ENT;  Laterality: Bilateral;  . UPPER GASTROINTESTINAL  ENDOSCOPY  08/2011    Family History  Problem Relation Age of Onset  . Heart attack Father   . Hypertension Father   . Hypertension Daughter    Social History:  reports that he has quit smoking. He has never used smokeless tobacco. He reports current alcohol use of about 1.0 standard drinks of alcohol per week. He reports that he does not use drugs.  Allergies: No Known Allergies  Medications Prior to Admission  Medication Sig Dispense Refill  . acetaminophen (TYLENOL) 500 MG tablet Take 1,000 mg by mouth every 6 (six) hours as needed for moderate pain or headache.    Marland Kitchen amLODipine (NORVASC) 5 MG tablet TAKE 1 TABLET BY MOUTH DAILY (Patient taking differently: Take 5 mg by mouth daily. ) 90 tablet 1  . escitalopram (LEXAPRO) 10 MG tablet Take 10 mg by mouth every evening. Take with 20 mg tablet to equal 30 mg daily    . escitalopram (LEXAPRO) 20 MG tablet Take 1.5 tablets (30 mg total) by mouth daily. (Patient taking differently: Take 20 mg by mouth every evening. Take with 10 mg tablet to equal 30 mg daily) 45 tablet 2  . hydrocortisone 2.5 % cream Apply topically 2 (two) times daily as needed. (Patient taking differently: Apply 1 application topically 2 (two) times daily as needed (rash). ) 30 g 1  . LORazepam (ATIVAN) 0.5 MG tablet  Take 1 tablet (0.5 mg total) by mouth daily as needed for anxiety. 30 tablet 0  . omeprazole (PRILOSEC) 40 MG capsule TAKE 1 CAPSULE BY MOUTH  DAILY (Patient taking differently: Take 40 mg by mouth daily before breakfast. ) 90 capsule 2  . RA VITAMIN B12 2000 MCG TBCR take 1 tablet by mouth daily (Patient taking differently: Take 2,000 mcg by mouth daily. ) 30 tablet 5  . terazosin (HYTRIN) 5 MG capsule TAKE 1 CAPSULE BY MOUTH  DAILY (Patient not taking: Reported on 02/08/2019) 90 capsule 1    Results for orders placed or performed during the hospital encounter of 02/20/19 (from the past 48 hour(s))  Basic metabolic panel     Status: Abnormal   Collection  Time: 02/20/19  1:25 PM  Result Value Ref Range   Sodium 133 (L) 135 - 145 mmol/L   Potassium 4.3 3.5 - 5.1 mmol/L   Chloride 100 98 - 111 mmol/L   CO2 26 22 - 32 mmol/L   Glucose, Bld 92 70 - 99 mg/dL   BUN 20 8 - 23 mg/dL   Creatinine, Ser 0.71 0.61 - 1.24 mg/dL   Calcium 8.8 (L) 8.9 - 10.3 mg/dL   GFR calc non Af Amer >60 >60 mL/min   GFR calc Af Amer >60 >60 mL/min   Anion gap 7 5 - 15    Comment: Performed at [facility], [place] [address], [place], [place] [address]  CBC     Status: None   Collection Time: 02/20/19  1:25 PM  Result Value Ref Range   WBC 6.4 4.0 - 10.5 K/uL   RBC 4.91 4.22 - 5.81 MIL/uL   Hemoglobin 13.0 13.0 - 17.0 g/dL   HCT 40.3 39.0 - 52.0 %   MCV 82.1 80.0 - 100.0 fL   MCH 26.5 26.0 - 34.0 pg   MCHC 32.3 30.0 - 36.0 g/dL   RDW 13.9 11.5 - 15.5 %   Platelets 217 150 - 400 K/uL   nRBC 0.0 0.0 - 0.2 %    Comment: Performed at [facility], [place] [address], [place], [place] [address]  Hemoglobin A1c     Status: Abnormal   Collection Time: 02/20/19  1:25 PM  Result Value Ref Range   Hgb A1c MFr Bld 5.7 (H) 4.8 - 5.6 %    Comment: (NOTE) Pre diabetes:          5.7%-6.4% Diabetes:              >6.4% Glycemic control for   <7.0% adults with diabetes    Mean Plasma Glucose 116.89 mg/dL    Comment: Performed at [facility] [address] [place],  [address]   No results found.  Review of Systems  Constitutional: Negative for chills and fever.  HENT: Negative.   Eyes: Negative.   Respiratory: Negative.   Cardiovascular: Negative.   Gastrointestinal: Negative.   Genitourinary: Positive for urgency.  Musculoskeletal: Negative.   Skin: Negative.   Endo/Heme/Allergies: Negative.   Psychiatric/Behavioral: Negative.     Blood pressure 118/71, pulse 70, temperature 98 F (36.7 C), resp. rate 14, SpO2 100 %. Physical Exam  Constitutional: He appears well-developed.  HENT:  Head: Normocephalic.   Eyes: Pupils are equal, round, and reactive to light.  Neck: Normal range of motion.  Cardiovascular: Normal rate.  Respiratory: Effort normal.  Genitourinary:    Genitourinary Comments: Foley in place with medium yellow urine.    Musculoskeletal:  Normal range of motion.  Neurological: He is alert.  Skin: Skin is warm.  Psychiatric: He has a normal mood and affect.     Assessment/Plan  Proceed as planned with simple prostatectomy. Risks, benefits, alternatives, execpted peri-op course discussed previously and reiterated today.  In response to the COVID-19 crises affecting the [place], the [place] issued a request that all hospitals and ambulatory surgery centers suspend all non-urgent surgery.  In keeping with this guidance this patient's surgery has NOT been postponed as the patient and I agree that significant harm could result from delay including continued ER visits / healcare contacts from foley problems and progression of infections.   We specifically discussed that there is some risk of COVID-19 transmission in the peri-operative setting and that extra precautions are being taken to minimize this but there no guarantees that transmission will not occur. I answered all the patient's questions to the best of my ability.    Alexis Frock, MD 02/22/2019, 6:58 AM

## 2019-02-23 ENCOUNTER — Encounter (HOSPITAL_COMMUNITY): Payer: Self-pay | Admitting: Urology

## 2019-02-23 LAB — BASIC METABOLIC PANEL
Anion gap: 12 (ref 5–15)
BUN: 29 mg/dL — ABNORMAL HIGH (ref 8–23)
CO2: 14 mmol/L — ABNORMAL LOW (ref 22–32)
Calcium: 7 mg/dL — ABNORMAL LOW (ref 8.9–10.3)
Chloride: 108 mmol/L (ref 98–111)
Creatinine, Ser: 2 mg/dL — ABNORMAL HIGH (ref 0.61–1.24)
GFR calc Af Amer: 35 mL/min — ABNORMAL LOW (ref >60)
GFR calc non Af Amer: 31 mL/min — ABNORMAL LOW (ref >60)
Glucose, Bld: 381 mg/dL — ABNORMAL HIGH (ref 70–99)
Potassium: 4 mmol/L (ref 3.5–5.1)
Sodium: 134 mmol/L — ABNORMAL LOW (ref 135–145)

## 2019-02-23 LAB — HEMOGLOBIN AND HEMATOCRIT, BLOOD
HCT: 22.8 % — ABNORMAL LOW (ref 39.0–52.0)
HCT: 25.3 % — ABNORMAL LOW (ref 39.0–52.0)
HCT: 23.7 % — ABNORMAL LOW (ref 39.0–52.0)
Hemoglobin: 6.9 g/dL — CL (ref 13.0–17.0)
Hemoglobin: 8.2 g/dL — ABNORMAL LOW (ref 13.0–17.0)
Hemoglobin: 7.8 g/dL — ABNORMAL LOW (ref 13.0–17.0)

## 2019-02-23 LAB — GLUCOSE, CAPILLARY: Glucose-Capillary: 142 mg/dL — ABNORMAL HIGH (ref 70–99)

## 2019-02-23 LAB — PREPARE RBC (CROSSMATCH)

## 2019-02-23 LAB — ABO/RH: ABO/RH(D): O POS

## 2019-02-23 MED ORDER — SODIUM CHLORIDE 0.9% IV SOLUTION
Freq: Once | INTRAVENOUS | Status: AC
  Administered 2019-02-23: 03:00:00 via INTRAVENOUS

## 2019-02-23 MED ORDER — HYDROCODONE-ACETAMINOPHEN 5-325 MG PO TABS
1.0000 | ORAL_TABLET | Freq: Four times a day (QID) | ORAL | Status: DC | PRN
  Administered 2019-02-23 – 2019-02-24 (×4): 1 via ORAL
  Administered 2019-02-25: 2 via ORAL
  Administered 2019-02-25 (×3): 1 via ORAL
  Administered 2019-02-26 (×2): 2 via ORAL
  Filled 2019-02-23: qty 1
  Filled 2019-02-23: qty 2
  Filled 2019-02-23: qty 1
  Filled 2019-02-23 (×2): qty 2
  Filled 2019-02-23 (×6): qty 1

## 2019-02-23 NOTE — Progress Notes (Signed)
PCP order to stop CBI until foley is draining.

## 2019-02-23 NOTE — Progress Notes (Signed)
Patient had copious amounts of fluids run through the [place]. Over 1375 ml. Foley was draining very little (50 ml) CBI was still flowing. PCP on call was notified

## 2019-02-23 NOTE — Progress Notes (Signed)
CRITICAL VALUE ALERT  Critical Value:  Hemoglobin 6.9  Date & Time Notied:  02/23/19;0205  Provider Notified:yes  Orders Received/Actions taken:Awaiting orders

## 2019-02-23 NOTE — Progress Notes (Signed)
Foley is draining. Hand irrigation done.

## 2019-02-23 NOTE — Progress Notes (Signed)
Patient ID: [name], male   DOB: October 26, 1936, 83 y.o.   MRN: [id number]  1 Day Post-Op Subjective: Pt with hypotension and bleeding overnight with drop of Hgb to 6.9.  He received one unit of PRBCs with improvement of blood pressure.  Post-transfusion Hgb pending.  Also with increased drain output.  Nursing has been reinforcing dressing multiple times overnight.   Currently, he is feeling much improved.  Pain is controlled. B&O suppositories have controlled bladder spasms.  Objective: Vital signs in last 24 hours: Temp:  [97.5 F (36.4 C)-99 F (37.2 C)] 98.2 F (36.8 C) (04/25 9476) Pulse Rate:  [53-105] 85 (04/25 0738) Resp:  [11-22] 14 (04/25 0455) BP: (76-145)/(46-91) 111/65 (04/25 0738) SpO2:  [92 %-100 %] 100 % (04/25 0738) Weight:  [62.6 kg] 62.6 kg (04/24 2158)  Intake/Output from previous day: 04/24 0701 - 04/25 0700 In: 27490.5 [P.O.:240; I.V.:4923; Blood:407.5; IV Piggyback:100] Out: 15075 [LYYTK:35465; Drains:2505; Blood:200] Intake/Output this shift: Total I/O In: -  Out: 350 [Urine:350]  Physical Exam:  General: Alert and oriented Abdomen: Soft, ND, drain with serosanguinous fluid, dressing saturated  Incisions: C/D/I GU: Urine from catheter is light red on minimal-moderate CBI Ext: NT, No erythema  Lab Results: Recent Labs    02/22/19 1053 02/22/19 1530 02/23/19 0126  HGB 10.5* 9.4* 6.9*  HCT 34.1* 29.1* 22.8*   BMET Recent Labs    02/20/19 1325 02/23/19 0126  NA 133* 134*  K 4.3 4.0  CL 100 108  CO2 26 14*  GLUCOSE 92 381*  BUN 20 29*  CREATININE 0.71 2.00*  CALCIUM 8.8* 7.0*     Studies/Results: No results found.  Assessment/Plan: POD # 1 s/p simple prostatectomy 1) AKI: Likely pre-renal.  Continue IVF hydration and follow renal function. 2) Anemia: Acute postoperative blood loss s/p 1 unit transfusion overnight.  Now hemodynamically stable.  Continue to monitor Hgb and vitals. 3) GU: Continue to titrate CBI 4) Advance diet 5)  DVT prophylaxis with SCDs.  6) Possible urine leak: Drain output quite high.  Will continue to monitor and consider drain Cr level tomorrow.   LOS: 1 day   Dutch Gray 02/23/2019, 9:15 AM

## 2019-02-24 LAB — CREATININE, FLUID (PLEURAL, PERITONEAL, JP DRAINAGE): Creat, Fluid: 0.9 mg/dL

## 2019-02-24 LAB — BASIC METABOLIC PANEL
Anion gap: 3 — ABNORMAL LOW (ref 5–15)
BUN: 22 mg/dL (ref 8–23)
CO2: 21 mmol/L — ABNORMAL LOW (ref 22–32)
Calcium: 7.4 mg/dL — ABNORMAL LOW (ref 8.9–10.3)
Chloride: 108 mmol/L (ref 98–111)
Creatinine, Ser: 1.04 mg/dL (ref 0.61–1.24)
GFR calc Af Amer: >60 mL/min (ref >60)
GFR calc non Af Amer: >60 mL/min (ref >60)
Glucose, Bld: 119 mg/dL — ABNORMAL HIGH (ref 70–99)
Potassium: 3.6 mmol/L (ref 3.5–5.1)
Sodium: 132 mmol/L — ABNORMAL LOW (ref 135–145)

## 2019-02-24 LAB — HEMOGLOBIN AND HEMATOCRIT, BLOOD
HCT: 21.8 % — ABNORMAL LOW (ref 39.0–52.0)
Hemoglobin: 7.1 g/dL — ABNORMAL LOW (ref 13.0–17.0)

## 2019-02-24 MED ORDER — BISACODYL 10 MG RE SUPP
10.0000 mg | Freq: Once | RECTAL | Status: AC
  Administered 2019-02-24: 10 mg via RECTAL
  Filled 2019-02-24: qty 1

## 2019-02-24 NOTE — Progress Notes (Signed)
Pt ambulated around 280 feet in hallway with standby assist. Pt tolerated walk very well but did c/o SOB at end of walk. O2 Sats 98% RA after sitting down in room. Pt now sitting up in chair for second time today, IS use encouraged again.

## 2019-02-24 NOTE — Progress Notes (Signed)
Patient ID: [name], male   DOB: [birth year], 83 y.o.   MRN: [id number]  2 Days Post-Op Subjective: Pt doing well over last 24 hrs.  Has remained hemodynamically stable.  Some abdominal pain and distention.  Minimal flatus thus far.  CBI still running but with slow drip. Drain output significantly decreased.  Objective: Vital signs in last 24 hours: Temp:  [97.9 F (36.6 C)-99 F (37.2 C)] 98.7 F (37.1 C) (04/26 0503) Pulse Rate:  [74-81] 79 (04/26 0503) Resp:  [14-20] 20 (04/26 0503) BP: (98-160)/(65-85) 160/85 (04/26 0503) SpO2:  [92 %-97 %] 92 % (04/26 0503)  Intake/Output from previous day: 04/25 0701 - 04/26 0700 In: 11628.4 [P.O.:1290; I.V.:2838.4] Out: 9166 [Urine:7450; Drains:70] Intake/Output this shift: Total I/O In: -  Out: 1005 [Urine:1000; Drains:5]  Physical Exam:  General: Alert and oriented Abdomen: Soft, mild distention Incisions: C/D/I GU: Urine red tinged but draining very well.  CBI on slow drip. Ext: NT, No erythema  Lab Results: Recent Labs    02/23/19 0921 02/23/19 1441 02/24/19 0520  HGB 8.2* 7.8* 7.1*  HCT 25.3* 23.7* 21.8*   BMET Recent Labs    02/23/19 0126 02/24/19 0520  NA 134* 132*  K 4.0 3.6  CL 108 108  CO2 14* 21*  GLUCOSE 381* 119*  BUN 29* 22  CREATININE 2.00* 1.04  CALCIUM 7.0* 7.4*     Studies/Results: No results found.  Assessment/Plan: POD # 2 s/p RAL simple prostatectomy - Renal function improved with further IVF hydration and AKI appears resolved. - Hgb drifting down but hemodynamically very stable.  Will hold off on transfusion for now.  Will begin ambulating today.  Recheck Hgb in AM tomorrow. - Continue to wean CBI - Check drain Cr but output significantly decreased   LOS: 2 days   Dutch Gray 02/24/2019, 8:45 AM

## 2019-02-24 NOTE — Progress Notes (Signed)
Pt sat up in chair for around 2 hours this AM before requesting to go back to bed to rest. Pt also had a small BM with a good amount of passed gas in BR. Will encourage pt to ambulate in hallway throughout this shift. IS also encouraged frequently.

## 2019-02-25 LAB — HEMOGLOBIN AND HEMATOCRIT, BLOOD
HCT: 21.4 % — ABNORMAL LOW (ref 39.0–52.0)
HCT: 25.1 % — ABNORMAL LOW (ref 39.0–52.0)
Hemoglobin: 7.1 g/dL — ABNORMAL LOW (ref 13.0–17.0)
Hemoglobin: 8.5 g/dL — ABNORMAL LOW (ref 13.0–17.0)

## 2019-02-25 LAB — BASIC METABOLIC PANEL
Anion gap: 6 (ref 5–15)
BUN: 11 mg/dL (ref 8–23)
CO2: 22 mmol/L (ref 22–32)
Calcium: 7.5 mg/dL — ABNORMAL LOW (ref 8.9–10.3)
Chloride: 107 mmol/L (ref 98–111)
Creatinine, Ser: 0.85 mg/dL (ref 0.61–1.24)
GFR calc Af Amer: >60 mL/min (ref >60)
GFR calc non Af Amer: >60 mL/min (ref >60)
Glucose, Bld: 120 mg/dL — ABNORMAL HIGH (ref 70–99)
Potassium: 2.9 mmol/L — ABNORMAL LOW (ref 3.5–5.1)
Sodium: 135 mmol/L (ref 135–145)

## 2019-02-25 LAB — PREPARE RBC (CROSSMATCH)

## 2019-02-25 MED ORDER — SODIUM CHLORIDE 0.9% IV SOLUTION: Freq: Once | INTRAVENOUS | Status: DC

## 2019-02-25 MED ORDER — SODIUM CHLORIDE 0.9% IV SOLUTION
Freq: Once | INTRAVENOUS | Status: AC
  Administered 2019-02-25: 09:00:00 via INTRAVENOUS

## 2019-02-25 NOTE — Progress Notes (Signed)
Pt's first unit of blood was completed and RN went to lab to grab 2nd unit of blood. Lab informed RN that they only had one unit that was prepared and they didn't prepare the second unit due to the shortage of blood. They said that for the second unit patient will have to get an H&H first to check the levels and if it's below a certain amount then they will prepare the second unit of blood. RN paged MD to make him aware of situation. Will continue to monitor patient.  Timoteo Gaul, RN

## 2019-02-25 NOTE — Progress Notes (Signed)
RN was across the hall in another patient's room when she heard the patient yelling out for help. When coming into the room upon assessment of patient he was diaphoretic, holding his stomach, wheezing, and yelling that he was in pain. RN immediately stopped blood transfusion, put patient on 2L of oxygen, and took vital signs. Patient's oxygen level was 94% on 2L, blood pressure was in the 170's/80's. After assessing patient's lungs, his wheezing was all upper airway. RN noticed that the CBI wasn't running like it should so she told patient she was going to irrigate to see if that helped. RN irrigated successfully and removed several larger clots. This immediately gave the patient relief and after stating his pain was a 10 he now rated it a 2 after irrigation. Patient had stopped wheezing and blood pressure returned to normal and oxygen came up to 100% on room air. Patient's blood was restarted. Patient is currently resting in bed. RN updated on night shift RN about the events that had taken place. Will continue to monitor patient.  Timoteo Gaul, RN

## 2019-02-25 NOTE — Progress Notes (Signed)
Pt called RN to come to room to assess foley catheter, which was causing him some discomfort. Pt has had catheter irrigated before so he felt like this was what needed to be done. RN assessed patient's foley catheter and CBI and decided it would be good to irrigate. RN successfully irrigated foley catheter and removed several small clots. Pt's CBI is running currently at a slow-moderate rate and output is clear/pink in color. Will continue to monitor patient for any further changes.  Timoteo Gaul, RN

## 2019-02-25 NOTE — Progress Notes (Signed)
3 Days Post-Op   Subjective/Chief Complaint:  1 - Massive Prostate With Urinary Retention - s/p robotic simple prostatectomy 02/22/19 for refractory retention, requiring repeat ER and office visits for catheter problems prior. Path pending.   2 - Acute Blood Loss Anemia - Minimal intraopeative blood loss 4/14, but developed significant hematuria post-op beginning in PACU managed with increased catheter balloon fluid and traction. 4/24 pre - op Hgb - 13 -->  9.4 after several hours in PACU, begin bladder irrigation and light traction 4/25 - 6.9 --> 1u RBC, recheck 8.1 after 4/26 - 7.1 - observe 4/27 - 7.1 ==> plan 2 units as some weakness  Today "[name]" is stable. Urine clearing, Hgb stable but low. He definitely feel weak.    Objective: Vital signs in last 24 hours: Temp:  [98.6 F (37 C)-99.7 F (37.6 C)] 99.1 F (37.3 C) (04/27 0430) Pulse Rate:  [86-88] 86 (04/27 0430) Resp:  [18-20] 18 (04/27 0430) BP: (138-162)/(68-78) 162/78 (04/27 0430) SpO2:  [88 %-98 %] 98 % (04/27 0430) Last BM Date: 02/24/19  Intake/Output from previous day: 04/26 0701 - 04/27 0700 In: 14610 [P.O.:980] Out: 88502 [Urine:12545; Drains:48] Intake/Output this shift: Total I/O In: 3000 [Other:3000] Out: 1370 [Urine:1250; Drains:120]  General appearance: alert and cooperative Eyes: negative, conjunctivae/corneas clear. PERRL, EOM's intact. Fundi benign., stable mild disconjugate gaze, at basleine.  Nose: Nares normal. Septum midline. Mucosa normal. No drainage or sinus tenderness. Throat: lips, mucosa, and tongue normal; teeth and gums normal Neck: supple, symmetrical, trachea midline Back: symmetric, no curvature. ROM normal. No CVA tenderness. Resp: non-labored on minimal [place] O2 Cardio: Nl rate GI: soft, non-tender; bowel sounds normal; no masses,  no organomegaly Male genitalia: normal, 3 way foley in place wtih clear urine and occasional very small (old) grain of rice sized clot on slow irrigation  and very light traction.  Extremities: extremities normal, atraumatic, no cyanosis or edema Skin: Skin color, texture, turgor normal. No rashes or lesions Lymph nodes: Cervical, supraclavicular, and axillary nodes normal. Neurologic: Grossly normal  Lab Results:  Recent Labs    02/24/19 0520 02/25/19 0512  HGB 7.1* 7.1*  HCT 21.8* 21.4*   BMET Recent Labs    02/24/19 0520 02/25/19 0512  NA 132* 135  K 3.6 2.9*  CL 108 107  CO2 21* 22  GLUCOSE 119* 120*  BUN 22 11  CREATININE 1.04 0.85  CALCIUM 7.4* 7.5*   PT/INR No results for input(s): LABPROT, INR in the last 72 hours. ABG No results for input(s): PHART, HCO3 in the last 72 hours.  Invalid input(s): PCO2, PO2  Studies/Results: No results found.  Anti-infectives: Anti-infectives (From admission, onward)   Start     Dose/Rate Route Frequency Ordered Stop   02/22/19 0534  ceFAZolin (ANCEF) 2-4 GM/100ML-% IVPB    Note to Pharmacy:  Charmayne Sheer   : cabinet override      02/22/19 0534 02/22/19 0739   02/22/19 0530  ceFAZolin (ANCEF) IVPB 2g/100 mL premix     2 g 200 mL/hr over 30 Minutes Intravenous 30 min pre-op 02/22/19 0530 02/22/19 0749   02/22/19 0000  sulfamethoxazole-trimethoprim (BACTRIM DS) 800-160 MG tablet     1 tablet Oral 2 times daily 02/22/19 1011        Assessment/Plan:  1 - Massive Prostate With Urinary Retention - s/p simple prostatectomy, keep current catheter.   2 - Acute Blood Loss Anemia - hgb stable and no signs of active bleed at present. He is symptomatic from anemia  and I rec 2u pRBC today. H/H again in AM tomorrow.  Goals for DC discussed, including Hgb stability with improved anemic symptoms.   Remain in house.   Alexis Frock 02/25/2019

## 2019-02-25 NOTE — Care Management Important Message (Signed)
Important Message  Patient Details IM Letter given to Dessa Phi RN to present to the Patient Name: [name] MRN: [id number] Date of Birth: Sep 17, 1936   Medicare Important Message Given:  Yes    Kerin Salen 02/25/2019, 1:17 PM

## 2019-02-26 LAB — BPAM RBC
Blood Product Expiration Date: 202005012359
Blood Product Expiration Date: 202005012359
Blood Product Expiration Date: 202005062359
ISSUE DATE / TIME: 202004250358
ISSUE DATE / TIME: 202004271017
ISSUE DATE / TIME: 202004271739
Unit Type and Rh: 5100
Unit Type and Rh: 5100
Unit Type and Rh: 9500

## 2019-02-26 LAB — TYPE AND SCREEN
ABO/RH(D): O POS
Antibody Screen: NEGATIVE
Unit division: 0
Unit division: 0
Unit division: 0

## 2019-02-26 LAB — BASIC METABOLIC PANEL
Anion gap: 4 — ABNORMAL LOW (ref 5–15)
BUN: 11 mg/dL (ref 8–23)
CO2: 23 mmol/L (ref 22–32)
Calcium: 7.1 mg/dL — ABNORMAL LOW (ref 8.9–10.3)
Chloride: 109 mmol/L (ref 98–111)
Creatinine, Ser: 0.69 mg/dL (ref 0.61–1.24)
GFR calc Af Amer: >60 mL/min (ref >60)
GFR calc non Af Amer: >60 mL/min (ref >60)
Glucose, Bld: 116 mg/dL — ABNORMAL HIGH (ref 70–99)
Potassium: 3.1 mmol/L — ABNORMAL LOW (ref 3.5–5.1)
Sodium: 136 mmol/L (ref 135–145)

## 2019-02-26 LAB — HEMOGLOBIN AND HEMATOCRIT, BLOOD
HCT: 27.5 % — ABNORMAL LOW (ref 39.0–52.0)
Hemoglobin: 8.9 g/dL — ABNORMAL LOW (ref 13.0–17.0)

## 2019-02-26 NOTE — Progress Notes (Signed)
4 Days Post-Op   Subjective/Chief Complaint:  1 - Massive Prostate With Urinary Retention - s/p robotic simple prostatectomy 02/22/19 for refractory retention, requiring repeat ER and office visits for catheter problems prior. Path benign.   2 - Acute Blood Loss Anemia - Minimal intraopeative blood loss 4/14, but developed significant hematuria post-op beginning in PACU managed with increased catheter balloon fluid and traction. 4/24 pre - op Hgb - 13 -->  9.4 after several hours in PACU, begin bladder irrigation and light traction 4/25 - 6.9 --> 1u RBC, recheck 8.1 after 4/26 - 7.1 - observe 4/27 - 7.1 ==> 2 units as some weakness 4/28 - 8.9 (appropriate response)  Today "[place]" is stable. Urine essentially clear, just occasional old small clot. Hgb this AM with appropriate response.    Objective: Vital signs in last 24 hours: Temp:  [97.6 F (36.4 C)-99.5 F (37.5 C)] 98.3 F (36.8 C) (04/28 0354) Pulse Rate:  [69-85] 69 (04/28 0354) Resp:  [18-20] 18 (04/28 0354) BP: (147-158)/(73-84) 156/78 (04/28 0354) SpO2:  [95 %-99 %] 96 % (04/28 0354) Last BM Date: 02/24/19  Intake/Output from previous day: 04/27 0701 - 04/28 0700 In: 93235 [Blood:683] Out: 57322 [GURKY:70623; Drains:1080] Intake/Output this shift: Total I/O In: 1400 [P.O.:200; Other:1200] Out: 1775 [Urine:1775]  General appearance: alert and cooperative Eyes: negative, conjunctivae/corneas clear. PERRL, EOM's intact. Fundi benign., stable mild disconjugate gaze, at basleine.  Nose: Nares normal. Septum midline. Mucosa normal. No drainage or sinus tenderness. Throat: lips, mucosa, and tongue normal; teeth and gums normal Neck: supple, symmetrical, trachea midline Back: symmetric, no curvature. ROM normal. No CVA tenderness. Resp: non-labored on minimal [place] O2 Cardio: Nl rate GI: soft, non-tender; bowel sounds normal; no masses,  no organomegaly Male genitalia: normal, 3 way foley in place wtih clear urine and  occasional very small (old) grain of rice sized clot on slow irrigation and very light traction. JP with scant serous output that is non-foul.  Extremities: extremities normal, atraumatic, no cyanosis or edema Skin: Skin color, texture, turgor normal. No rashes or lesions Lymph nodes: Cervical, supraclavicular, and axillary nodes normal. Neurologic: Grossly normal   Lab Results:  Recent Labs    02/25/19 1632 02/26/19 0511  HGB 8.5* 8.9*  HCT 25.1* 27.5*   BMET Recent Labs    02/25/19 0512 02/26/19 0511  NA 135 136  K 2.9* 3.1*  CL 107 109  CO2 22 23  GLUCOSE 120* 116*  BUN 11 11  CREATININE 0.85 0.69  CALCIUM 7.5* 7.1*   PT/INR No results for input(s): LABPROT, INR in the last 72 hours. ABG No results for input(s): PHART, HCO3 in the last 72 hours.  Invalid input(s): PCO2, PO2  Studies/Results: No results found.  Anti-infectives: Anti-infectives (From admission, onward)   Start     Dose/Rate Route Frequency Ordered Stop   02/22/19 0534  ceFAZolin (ANCEF) 2-4 GM/100ML-% IVPB    Note to Pharmacy:  Charmayne Sheer   : cabinet override      02/22/19 0534 02/22/19 0739   02/22/19 0530  ceFAZolin (ANCEF) IVPB 2g/100 mL premix     2 g 200 mL/hr over 30 Minutes Intravenous 30 min pre-op 02/22/19 0530 02/22/19 0749   02/22/19 0000  sulfamethoxazole-trimethoprim (BACTRIM DS) 800-160 MG tablet     1 tablet Oral 2 times daily 02/22/19 1011        Assessment/Plan:  1 - Massive Prostate With Urinary Retention - s/p simple prostatectomy, keep current catheter. Turn off CBI and saline lock.  2 - Acute Blood Loss Anemia - hgb stabilized and no signs of active bleed at present.   Encouraged ambulation and OOB.   Hgb in AM and likely DC home.     Alexis Frock 02/26/2019

## 2019-02-27 LAB — HEMOGLOBIN AND HEMATOCRIT, BLOOD
HCT: 28.2 % — ABNORMAL LOW (ref 39.0–52.0)
Hemoglobin: 9.4 g/dL — ABNORMAL LOW (ref 13.0–17.0)

## 2019-02-27 NOTE — Discharge Summary (Signed)
Physician Discharge Summary  Patient ID: [name] MRN: [id number] DOB/AGE: 1936/06/20 83 y.o.  Admit date: 02/22/2019 Discharge date: 02/27/2019  Admission Diagnoses: Massive Prostate with Urinary Retention and Recurrent Infections  Discharge Diagnoses:  Massive Prostate with Urinary Retention and Recurrent Infections, Acute Blood Loss Anemia  Discharged Condition: good  Hospital Course:   1 - Massive Prostate With Urinary Retention - s/p robotic simple prostatectomy 02/22/19 for refractory retention, requiring repeat ER and office visits for catheter problems prior. Path benign. Admitted to 4th floor Urology service post-op. JP removed 4/29 AM as output minimal and JP Cr same as serum.   2 - Acute Blood Loss Anemia - Minimal intraopeative blood loss 4/24, but developed significant hematuria post-op beginning in PACU managed with increased catheter balloon fluid and traction. 4/24 pre - op Hgb - 13 -->  9.4 after several hours in PACU, begin bladder irrigation and light traction 4/25 - 6.9 --> 1u RBC, recheck 8.1 after 4/26 - 7.1 - observe 4/27 - 7.1 ==> 2 units as some weakness 4/28 - 8.9 (appropriate response) 4/29 - 9.4 without orthostasis.  By the AM of 4/29, he is ambulatory, pain controlled on PO meds, Hgb stable, and felt to be adequate for discharge.     Consults: None  Significant Diagnostic Studies: labs: as per above  Treatments: surgery: as per above  Discharge Exam: Blood pressure (!) 162/77, pulse 66, temperature 98.5 F (36.9 C), temperature source Oral, resp. rate 20, height 5\' 7"  (1.702 m), weight 62.6 kg, SpO2 97 %.  General appearance: alert and cooperative Eyes: stable mild disconjugate gaze, at basleine.  Nose: Nares normal. Septum midline. Mucosa normal. No drainage or sinus tenderness. Throat: lips, mucosa, and tongue normal; teeth and gums normal Neck: supple, symmetrical, trachea midline Back: symmetric, no curvature. ROM normal. No CVA  tenderness. Resp: non-labored on minimal [place] O2 Cardio: Nl rate GI: soft, non-tender; bowel sounds normal; no masses,  no organomegaly. Recent port and extraction sites c/d/i. No hernias.  Male genitalia: normal, 3 way foley in place wtih clear urine that is non-foul. Irrigation port plugged. JP removed and dry dressing applied as output scant and non-foul.   Extremities: extremities normal, atraumatic, no cyanosis or edema Skin: Skin color, texture, turgor normal. No rashes or lesions Lymph nodes: Cervical, supraclavicular, and axillary nodes normal. Neurologic: Grossly normal   Disposition: HOME   Allergies as of 02/27/2019   No Known Allergies     Medication List    STOP taking these medications   RA Vitamin B12 2000 MCG Tbcr Generic drug:  Cyanocobalamin   terazosin 5 MG capsule Commonly known as:  HYTRIN     TAKE these medications   acetaminophen 500 MG tablet Commonly known as:  TYLENOL Take 1,000 mg by mouth every 6 (six) hours as needed for moderate pain or headache.   amLODipine 5 MG tablet Commonly known as:  NORVASC TAKE 1 TABLET BY MOUTH DAILY   escitalopram 10 MG tablet Commonly known as:  LEXAPRO Take 10 mg by mouth every evening. Take with 20 mg tablet to equal 30 mg daily What changed:  Another medication with the same name was changed. Make sure you understand how and when to take each.   escitalopram 20 MG tablet Commonly known as:  LEXAPRO Take 1.5 tablets (30 mg total) by mouth daily. What changed:    how much to take  when to take this  additional instructions   HYDROcodone-acetaminophen 5-325 MG tablet Commonly known as:  Norco Take 1-2 tablets by mouth every 6 (six) hours as needed for moderate pain or severe pain.   hydrocortisone 2.5 % cream Apply topically 2 (two) times daily as needed. What changed:    how much to take  reasons to take this   LORazepam 0.5 MG tablet Commonly known as:  Ativan Take 1 tablet (0.5 mg total) by  mouth daily as needed for anxiety.   omeprazole 40 MG capsule Commonly known as:  PRILOSEC TAKE 1 CAPSULE BY MOUTH  DAILY What changed:  when to take this   sulfamethoxazole-trimethoprim 800-160 MG tablet Commonly known as:  BACTRIM DS Take 1 tablet by mouth 2 (two) times daily. Start the day prior to foley removal appointment      Follow-up Information    Alexis Frock, MD On 03/04/2019.   Specialty:  Urology Why:  at 9 AM for MD visit and office catheter removal.  Contact information: [address] [place] [address] [phone]           Signed: Alexis Frock 02/27/2019, 9:23 AM

## 2019-03-07 DIAGNOSIS — K219 Gastro-esophageal reflux disease without esophagitis: Secondary | ICD-10-CM | POA: Diagnosis not present

## 2019-03-07 DIAGNOSIS — D509 Iron deficiency anemia, unspecified: Secondary | ICD-10-CM | POA: Diagnosis not present

## 2019-03-07 LAB — CBC AND DIFFERENTIAL
Hemoglobin: 10.6 — AB (ref 13.5–17.5)
Platelets: 465 — AB (ref 150–399)
WBC: 11.8

## 2019-03-07 LAB — IRON,TIBC AND FERRITIN PANEL
Ferritin: 552
Iron: 13

## 2019-03-08 ENCOUNTER — Other Ambulatory Visit: Payer: Self-pay | Admitting: Hematology and Oncology

## 2019-03-08 ENCOUNTER — Ambulatory Visit: Payer: Self-pay

## 2019-03-08 ENCOUNTER — Encounter: Payer: Self-pay | Admitting: Cardiology

## 2019-03-08 ENCOUNTER — Ambulatory Visit (INDEPENDENT_AMBULATORY_CARE_PROVIDER_SITE_OTHER): Payer: Medicare Other | Admitting: Family Medicine

## 2019-03-08 ENCOUNTER — Other Ambulatory Visit: Payer: Self-pay

## 2019-03-08 DIAGNOSIS — R509 Fever, unspecified: Secondary | ICD-10-CM | POA: Diagnosis not present

## 2019-03-08 DIAGNOSIS — D509 Iron deficiency anemia, unspecified: Secondary | ICD-10-CM

## 2019-03-08 DIAGNOSIS — N400 Enlarged prostate without lower urinary tract symptoms: Secondary | ICD-10-CM | POA: Diagnosis not present

## 2019-03-08 DIAGNOSIS — N3000 Acute cystitis without hematuria: Secondary | ICD-10-CM | POA: Diagnosis not present

## 2019-03-08 DIAGNOSIS — R8279 Other abnormal findings on microbiological examination of urine: Secondary | ICD-10-CM | POA: Diagnosis not present

## 2019-03-08 NOTE — Telephone Encounter (Signed)
Phone call from pt. And his wife.  Reported fever of 103 @ 11:15 PM 5/7.  Monitored temp. through night, and it came down to 99.1 at 3:25 AM, then started to go back up at 5:30 AM @ 100 degrees.   Pt. Is s/p prostatectomy of 02/22/19. Reported he had been hospitalized for 5 days following the surgery.  Called the NP on call for Urology during the night, and was advised to treat with Tylenol, and to take to the ER.  Per wife, pt. declined going to the ER.  Denied any cough, shortness of breath, nausea/ vomiting, headache, body aches, sore throat, or chest pain.  Stated his catheter came out on Monday, 5/4.  Denied any increased burning, urgency or frequency with urination.  Stated his urine is clear.  Reported increased weakness over the past 2-3 days.  Stated he "has no energy."  Wife reported pt. Recently had blood work done per "Dr. Collene Mares", and thought the WBC was "low".  Wife reported the WBC to be 11.8.  Called FC; transferred call to office for an appt. to be scheduled.  Wife and pt. Agreed with plan.     Reason for Disposition . [1] Fever > 100.0 F (37.8 C) AND [2] surgery in the last month    Temp. 103 @ 11:15 PM 5/7; now down to 98.3 at 8:00 AM.  S/p Simple prostatectomy of 4/24; Foley cath. came out on 5/4; denied any burning, urgency, or frequency.  Answer Assessment - Initial Assessment Questions 1. TEMPERATURE: "What is the most recent temperature?"  "How was it measured?"      Temp 98.3 @ 8:00 AM; had fever at 11:15 PM 5/7 of 103 2. ONSET: "When did the fever start?"      Last night at 11:15 PM 3. SYMPTOMS: "Do you have any other symptoms besides the fever?"  (e.g., colds, headache, sore throat, earache, cough, rash, diarrhea, vomiting, abdominal pain)     Denied cough, headache, body aches, sore throat, nausea / vomiting.  C/o increased weakness over past 3 days; no energy; denied shortness of breath or chest pain; catheter came out Monday, 5/4; urinating well; no increased burning, urgency,  or frequency    4. CAUSE: If there are no symptoms, ask: "What do you think is causing the fever?"     unknown 5. CONTACTS: "Does anyone else in the family have an infection?"     Denied; no outside contact 6. TREATMENT: "What have you done so far to treat this fever?" (e.g., medications)     Tylenol for fever 7. IMMUNOCOMPROMISE: "Do you have of the following: diabetes, HIV positive, splenectomy, cancer chemotherapy, chronic steroid treatment, transplant patient, etc."     Recent surgery of prostatectomy; per wife, the WBC is low 8. PREGNANCY: "Is there any chance you are pregnant?" "When was your last menstrual period?"     n/a 9. TRAVEL: "Have you traveled out of the country in the last month?" (e.g., travel history, exposures)     Denied travel for past 2-3 months  Protocols used: FEVER-A-AH

## 2019-03-08 NOTE — Telephone Encounter (Signed)
We had visit today.

## 2019-03-08 NOTE — Telephone Encounter (Signed)
Appt scheduled per NT

## 2019-03-08 NOTE — Telephone Encounter (Signed)
FYI

## 2019-03-08 NOTE — Progress Notes (Signed)
Patient was hospitalized recently with profound anemia and received 3 to 4 units of packed red cells.  His prostate was removed and he had profound postoperative bleeding. Dr. Collene Mares performed a CBC at her office which showed a hemoglobin of 10.8 on 03/07/2019. He is feeling profoundly fatigued and requests an appointment to see me. We will set him up to see me in 10 days for follow-up.

## 2019-03-08 NOTE — Progress Notes (Signed)
Patient ID: [name], male   DOB: 28-Feb-1936, 83 y.o.   MRN: [id number]  This visit type was conducted due to national recommendations for restrictions regarding the COVID-19 pandemic in an effort to limit this patient's exposure and mitigate transmission in our community.   Virtual Visit via Video Note  I connected with [name] on 03/08/19 at  9:00 AM EDT by a video enabled telemedicine application and verified that I am speaking with the correct person using two identifiers.  Location patient: home Location provider:work or home office Persons participating in the virtual visit: patient, provider  I discussed the limitations of evaluation and management by telemedicine and the availability of in person appointments. The patient expressed understanding and agreed to proceed.   HPI:  Patient has had some fever for the past couple days.  His recent history is that he had robotic prostate surgery April 24.  His surgery was secondary to massive prostate with urinary retention and recurrent urinary infections.  He had some increased bleeding afterwards followed by hypotension.  His hemoglobin reportedly dropped below 6.  He received 2 units of packed red blood cells.  He also had complication post surgery of increased clots in his catheter.  He had a second transfusion of couple units and discharge hemoglobin was 9.4.  He went home on 29 April.  He seemed to be doing fairly well until 2 days ago.  He woke up with extreme fatigue and decreased appetite.  He felt the same yesterday.  He went to the gastroenterologist office (Dr Collene Mares, a friend) and had labs done which included platelet count of 464,000, white blood count 11.8 thousand, hemoglobin 10.8 thousand.  His blood pressure was 131/72.  Afebrile.  Last night around 11 PM he had some chills and felt feverish.  Temperature is 103.1.  After Tylenol this came down to 101.1.  His fever continued to drop and this morning he was afebrile and  feels better overall.  He has not had any skin rash.  Denies any dysuria.  He has had little bit of blood in his urine.  No cough.  No dyspnea.  No headache.  No sore throat.  No recent tick bites.  Denies abdominal pain.  ROS: See pertinent positives and negatives per HPI.  Past Medical History:  Diagnosis Date  . Anxiety   . BPH (benign prostatic hypertrophy)   . DDD (degenerative disc disease), lumbar 2002  . Depression   . Difficult intubation 02/22/2019   Anterior with limited oral opening  . GERD (gastroesophageal reflux disease)   . History of colon polyps   . History of hiatal hernia 2012  . Hyperlipemia    Mild  . Hypertension   . Indwelling Foley catheter present   . Iron deficiency anemia    IV iron therapy   . Pre-diabetes   . Renal cyst 2008   7.6 cm lower pole right renal cyst  . Sleep apnea    slight, no CPAP used    Past Surgical History:  Procedure Laterality Date  . COLONOSCOPY  08/2011  . NASAL SEPTOPLASTY W/ TURBINOPLASTY  08/03/2012   Procedure: NASAL SEPTOPLASTY WITH TURBINATE REDUCTION;  Surgeon: Izora Gala, MD;  Location: [place];  Service: ENT;  Laterality: Bilateral;  . UPPER GASTROINTESTINAL ENDOSCOPY  08/2011  . XI ROBOTIC ASSISTED SIMPLE PROSTATECTOMY N/A 02/22/2019   Procedure: XI ROBOTIC ASSISTED SIMPLE PROSTATECTOMY;  Surgeon: Alexis Frock, MD;  Location: [facility];  Service: Urology;  Laterality: N/A;  Family History  Problem Relation Age of Onset  . Heart attack Father   . Hypertension Father   . Hypertension Daughter     SOCIAL HX: Non-smoker   Current Outpatient Medications:  .  acetaminophen (TYLENOL) 500 MG tablet, Take 1,000 mg by mouth every 6 (six) hours as needed for moderate pain or headache., Disp: , Rfl:  .  amLODipine (NORVASC) 5 MG tablet, TAKE 1 TABLET BY MOUTH DAILY (Patient taking differently: Take 5 mg by mouth daily. ), Disp: 90 tablet, Rfl: 1 .  escitalopram (LEXAPRO) 10 MG tablet, Take 10 mg by mouth every  evening. Take with 20 mg tablet to equal 30 mg daily, Disp: , Rfl:  .  escitalopram (LEXAPRO) 20 MG tablet, Take 1.5 tablets (30 mg total) by mouth daily. (Patient taking differently: Take 20 mg by mouth every evening. Take with 10 mg tablet to equal 30 mg daily), Disp: 45 tablet, Rfl: 2 .  HYDROcodone-acetaminophen (NORCO) 5-325 MG tablet, Take 1-2 tablets by mouth every 6 (six) hours as needed for moderate pain or severe pain., Disp: 30 tablet, Rfl: 0 .  hydrocortisone 2.5 % cream, Apply topically 2 (two) times daily as needed. (Patient taking differently: Apply 1 application topically 2 (two) times daily as needed (rash). ), Disp: 30 g, Rfl: 1 .  LORazepam (ATIVAN) 0.5 MG tablet, Take 1 tablet (0.5 mg total) by mouth daily as needed for anxiety., Disp: 30 tablet, Rfl: 0 .  omeprazole (PRILOSEC) 40 MG capsule, TAKE 1 CAPSULE BY MOUTH  DAILY (Patient taking differently: Take 40 mg by mouth daily before breakfast. ), Disp: 90 capsule, Rfl: 2 .  sulfamethoxazole-trimethoprim (BACTRIM DS) 800-160 MG tablet, Take 1 tablet by mouth 2 (two) times daily. Start the day prior to foley removal appointment, Disp: 6 tablet, Rfl: 0  EXAM:  VITALS per patient if applicable:  GENERAL: alert, oriented, appears well and in no acute distress  HEENT: atraumatic, conjunttiva clear, no obvious abnormalities on inspection of external nose and ears  NECK: normal movements of the head and neck  LUNGS: on inspection no signs of respiratory distress, breathing rate appears normal, no obvious gross SOB, gasping or wheezing  CV: no obvious cyanosis  MS: moves all visible extremities without noticeable abnormality  PSYCH/NEURO: pleasant and cooperative, no obvious depression or anxiety, speech and thought processing grossly intact  ASSESSMENT AND PLAN:  Discussed the following assessment and plan:  Febrile illness.  His fever is down today but he had significant elevation 103 last night.  He is alert and nontoxic  in appearance.  Concern is whether he may have recurrent urinary infection especially with his recent surgery and significant instrumentation He does not receive describe any respiratory symptoms such as cough or dyspnea  -Patient already has follow-up with urology today at 12:30.  He is strongly encouraged to keep that.  Will need to have urine assessed -We have encouraged follow-up here if they do not find any source for his fever especially if he has any recurrent fever today.  He will monitor closely     I discussed the assessment and treatment plan with the patient. The patient was provided an opportunity to ask questions and all were answered. The patient agreed with the plan and demonstrated an understanding of the instructions.   The patient was advised to call back or seek an in-person evaluation if the symptoms worsen or if the condition fails to improve as anticipated.   Carolann Littler, MD

## 2019-03-10 ENCOUNTER — Encounter: Payer: Self-pay | Admitting: Cardiology

## 2019-03-10 DIAGNOSIS — I10 Essential (primary) hypertension: Secondary | ICD-10-CM | POA: Insufficient documentation

## 2019-03-10 DIAGNOSIS — R739 Hyperglycemia, unspecified: Secondary | ICD-10-CM | POA: Insufficient documentation

## 2019-03-10 HISTORY — DX: Essential (primary) hypertension: I10

## 2019-03-10 NOTE — Progress Notes (Deleted)
Primary Physician/Referring:  [name], MD  Patient ID: [name], male    DOB: February 11, 1936, 83 y.o.   MRN: [id number]  No chief complaint on file.   HPI: [name]  is a 83 y.o. male  with History of essential hypertension, OSA on CPAP, GERD, anxiety and depression, who has had class II prostate a widely and had undergone robotic prostatectomy on 3/71/69, complicated by multiple ED visits and blood loss anemia.  He is referred to me by Dr. Tyrone Sage for evaluation and management of hypertension as patient has been extremely concerned and wanted to see a specialist.  Patient has  mild hyperglycemia and mild hyperlipidemia with LDL of 104, last checked in 2017. ***  Past Medical History:  Diagnosis Date  . Anxiety   . BPH (benign prostatic hypertrophy)   . DDD (degenerative disc disease), lumbar 2002  . Depression   . Difficult intubation 02/22/2019   Anterior with limited oral opening  . Essential hypertension 03/10/2019  . GERD (gastroesophageal reflux disease)   . History of colon polyps   . History of hiatal hernia 2012  . Hyperlipemia    Mild  . Indwelling Foley catheter present   . Iron deficiency anemia    IV iron therapy   . Mild hyperlipidemia 02/20/2008   Qualifier: Diagnosis of  By: Leanne Chang MD, Bruce    . Pre-diabetes   . Renal cyst 2008   7.6 cm lower pole right renal cyst  . Sleep apnea    slight, no CPAP used    Past Surgical History:  Procedure Laterality Date  . COLONOSCOPY  08/2011  . NASAL SEPTOPLASTY W/ TURBINOPLASTY  08/03/2012   Procedure: NASAL SEPTOPLASTY WITH TURBINATE REDUCTION;  Surgeon: Izora Gala, MD;  Location: [facility];  Service: ENT;  Laterality: Bilateral;  . UPPER GASTROINTESTINAL ENDOSCOPY  08/2011  . XI ROBOTIC ASSISTED SIMPLE PROSTATECTOMY N/A 02/22/2019   Procedure: XI ROBOTIC ASSISTED SIMPLE PROSTATECTOMY;  Surgeon: Alexis Frock, MD;  Location: [facility];  Service: Urology;  Laterality: N/A;    Social History    Socioeconomic History  . Marital status: Married    Spouse name: Not on file  . Number of children: 2  . Years of education: Not on file  . Highest education level: Not on file  Occupational History  . Not on file  Social Needs  . Financial resource strain: Not hard at all  . Food insecurity:    Worry: Never true    Inability: Never true  . Transportation needs:    Medical: No    Non-medical: No  Tobacco Use  . Smoking status: Former Research scientist (life sciences)  . Smokeless tobacco: Never Used  . Tobacco comment: rare use over 50 years   Substance and Sexual Activity  . Alcohol use: Yes    Alcohol/week: 1.0 standard drinks    Types: 1 Shots of liquor per week    Comment: weekly  . Drug use: No  . Sexual activity: Never    Birth control/protection: None  Lifestyle  . Physical activity:    Days per week: 0 days    Minutes per session: 0 min  . Stress: Not at all  Relationships  . Social connections:    Talks on phone: Twice a week    Gets together: Once a week    Attends religious service: Never    Active member of club or organization: Yes    Attends meetings of clubs or organizations: More than 4  times per year    Relationship status: Married  . Intimate partner violence:    Fear of current or ex partner: No    Emotionally abused: No    Physically abused: No    Forced sexual activity: No  Other Topics Concern  . Not on file  Social History Narrative   Lives in 2 level    Will stay for now    Just moved here approx 1.5 yo   Very nice neighbors    [employer]  x 5 with a very busy holiday   One son is a Teacher, music and nephew is a Investment banker, operational    Son in [place] moved to [place] and he misses his grand children    As 2 brothers in [place]       Retired from [employer], [employer]        ***ROS    Objective  There were no vitals taken for this visit. There is no height or weight on file to calculate BMI.    ***Physical Exam Radiology: No results found.  Laboratory examination:   ***  CMP Latest Ref Rng & Units 02/26/2019 02/25/2019 02/24/2019  Glucose 70 - 99 mg/dL 116(H) 120(H) 119(H)  BUN 8 - 23 mg/dL 11 11 22   Creatinine 0.61 - 1.24 mg/dL 0.69 0.85 1.04  Sodium 135 - 145 mmol/L 136 135 132(L)  Potassium 3.5 - 5.1 mmol/L 3.1(L) 2.9(L) 3.6  Chloride 98 - 111 mmol/L 109 107 108  CO2 22 - 32 mmol/L 23 22 21(L)  Calcium 8.9 - 10.3 mg/dL 7.1(L) 7.5(L) 7.4(L)  Total Protein 6.1 - 8.1 g/dL - - -  Total Bilirubin 0.2 - 1.2 mg/dL - - -  Alkaline Phos 39 - 117 U/L - - -  AST 10 - 35 U/L - - -  ALT 9 - 46 U/L - - -   CBC Latest Ref Rng & Units 02/27/2019 02/26/2019 02/25/2019  WBC 4.0 - 10.5 K/uL - - -  Hemoglobin 13.0 - 17.0 g/dL 9.4(L) 8.9(L) 8.5(L)  Hematocrit 39.0 - 52.0 % 28.2(L) 27.5(L) 25.1(L)  Platelets 150 - 400 K/uL - - -   Lipid Panel     Component Value Date/Time   CHOL 182 07/08/2016 0841   TRIG 53.0 07/08/2016 0841   HDL 66.70 07/08/2016 0841   CHOLHDL 3 07/08/2016 0841   VLDL 10.6 07/08/2016 0841   LDLCALC 104 (H) 07/08/2016 0841   HEMOGLOBIN A1C Lab Results  Component Value Date   HGBA1C 5.7 (H) 02/20/2019   MPG 116.89 02/20/2019   TSH No results for input(s): TSH in the last 8760 hours.  PRN Meds:. There are no discontinued medications. No outpatient medications have been marked as taking for the 03/11/19 encounter (Appointment) with Adrian Prows, MD.    Cardiac Studies:   ***  Assessment   Mild hyperlipidemia  Essential hypertension  Hyperglycemia  EKG 02/20/2019: Normal sinus rhythm at rate of 63 bpm, left axis deviation, nonspecific T abnormality.  Recommendations:   ***  Adrian Prows, MD, [facility] 03/10/2019, 5:44 PM [facility] [place] Pager: [phone] Office: [phone] If no answer Cell [phone]

## 2019-03-11 ENCOUNTER — Ambulatory Visit: Payer: Self-pay | Admitting: Cardiology

## 2019-03-11 DIAGNOSIS — N3000 Acute cystitis without hematuria: Secondary | ICD-10-CM | POA: Diagnosis not present

## 2019-03-12 ENCOUNTER — Telehealth: Payer: Self-pay | Admitting: Hematology and Oncology

## 2019-03-12 NOTE — Assessment & Plan Note (Deleted)
Patient has had upper endoscopies and colonoscopies and did not have any clear identified source of blood loss.  However malabsorption is also a possibility. Patient is intolerant oforal iron therapy. Prior treatment: IV iron therapy: 07/13/2018 2 doses Hospitalization 02/22/2019-02/27/2019: Bleeding post prostatectomy requiring 3 units of PRBC  Lab review:

## 2019-03-12 NOTE — Telephone Encounter (Signed)
Scheduled appt per sch msg. Called and spoke with patients wife, needed to change day of appt due to patient having another appt. Confirmed date and time

## 2019-03-13 NOTE — Assessment & Plan Note (Deleted)
Patient has had upper endoscopies and colonoscopies and did not have any clear identified source of blood loss.  However malabsorption is also a possibility. Patient is intolerant oforal iron therapy. Prior treatment: IV iron therapy: 07/13/2018 2 doses  Patient was hospitalized recently with profound anemia and received 3 to 4 units of packed red cells.  His prostate was removed and he had profound postoperative bleeding. Dr. Collene Mares performed a CBC at her office which showed a hemoglobin of 10.8 on 03/07/2019.  Lab review:   Return to clinic in 3 months with labs and follow-up

## 2019-03-15 ENCOUNTER — Ambulatory Visit (INDEPENDENT_AMBULATORY_CARE_PROVIDER_SITE_OTHER): Payer: Medicare Other | Admitting: Family Medicine

## 2019-03-15 ENCOUNTER — Other Ambulatory Visit: Payer: Self-pay

## 2019-03-15 DIAGNOSIS — R531 Weakness: Secondary | ICD-10-CM

## 2019-03-15 NOTE — Progress Notes (Signed)
Patient ID: [name], male   DOB: [birth year], 83 y.o.   MRN: [id number]  This visit type was conducted due to national recommendations for restrictions regarding the COVID-19 pandemic in an effort to limit this patient's exposure and mitigate transmission in our community.   Virtual Visit via Video Note  I connected with Marlane Hatcher on 03/15/19 at  2:30 PM EDT by a video enabled telemedicine application and verified that I am speaking with the correct person using two identifiers.  Location patient: home Location provider:work or home office Persons participating in the virtual visit: patient, provider  I discussed the limitations of evaluation and management by telemedicine and the availability of in person appointments. The patient expressed understanding and agreed to proceed.   HPI: Patient's wife had requested possible home physical therapy.  Patient had recent robotic prostate surgery for BPH for urinary retention.  He had complication of postoperative bleeding with hemoglobin less than 6 requiring transfusion.  His hemoglobin is improving but he had recent complication also of infection which is currently being treated by urology.  His fever is down today.  He is currently on amoxicillin.  He has had poor appetite and generalized weakness.  Wife requesting home physical therapy.  No recent falls.  His appetite has picked up slightly past day.  They are receptive to home physical therapy   ROS: See pertinent positives and negatives per HPI.  Past Medical History:  Diagnosis Date  . Anxiety   . BPH (benign prostatic hypertrophy)   . DDD (degenerative disc disease), lumbar 2002  . Depression   . Difficult intubation 02/22/2019   Anterior with limited oral opening  . Essential hypertension 03/10/2019  . GERD (gastroesophageal reflux disease)   . History of colon polyps   . History of hiatal hernia 2012  . Hyperlipemia    Mild  . Indwelling Foley catheter present   . Iron  deficiency anemia    IV iron therapy   . Mild hyperlipidemia 02/20/2008   Qualifier: Diagnosis of  By: Leanne Chang MD, Deaglan Lile    . Pre-diabetes   . Renal cyst 2008   7.6 cm lower pole right renal cyst  . Sleep apnea    slight, no CPAP used    Past Surgical History:  Procedure Laterality Date  . COLONOSCOPY  08/2011  . NASAL SEPTOPLASTY W/ TURBINOPLASTY  08/03/2012   Procedure: NASAL SEPTOPLASTY WITH TURBINATE REDUCTION;  Surgeon: Izora Gala, MD;  Location: [facility];  Service: ENT;  Laterality: Bilateral;  . UPPER GASTROINTESTINAL ENDOSCOPY  08/2011  . XI ROBOTIC ASSISTED SIMPLE PROSTATECTOMY N/A 02/22/2019   Procedure: XI ROBOTIC ASSISTED SIMPLE PROSTATECTOMY;  Surgeon: Alexis Frock, MD;  Location: [facility];  Service: Urology;  Laterality: N/A;    Family History  Problem Relation Age of Onset  . Heart attack Father   . Hypertension Father   . Hypertension Daughter   . Hypertension Brother   . Hypertension Brother   . Hypertension Sister     SOCIAL HX: Married.  Wife very supportive   Current Outpatient Medications:  .  acetaminophen (TYLENOL) 500 MG tablet, Take 1,000 mg by mouth every 6 (six) hours as needed for moderate pain or headache., Disp: , Rfl:  .  amLODipine (NORVASC) 5 MG tablet, TAKE 1 TABLET BY MOUTH DAILY (Patient taking differently: Take 5 mg by mouth daily. ), Disp: 90 tablet, Rfl: 1 .  cyanocobalamin 2000 MCG tablet, Take 2,000 mcg by mouth daily., Disp: , Rfl:  .  escitalopram (LEXAPRO) 10 MG tablet, Take 10 mg by mouth every evening. Take with 20 mg tablet to equal 30 mg daily, Disp: , Rfl:  .  escitalopram (LEXAPRO) 20 MG tablet, Take 1.5 tablets (30 mg total) by mouth daily. (Patient taking differently: Take 20 mg by mouth every evening. Take with 10 mg tablet to equal 30 mg daily), Disp: 45 tablet, Rfl: 2 .  finasteride (PROSCAR) 5 MG tablet, Take 5 mg by mouth daily., Disp: , Rfl:  .  hydrocortisone 2.5 % cream, Apply topically 2 (two) times daily as needed.  (Patient taking differently: Apply 1 application topically 2 (two) times daily as needed (rash). ), Disp: 30 g, Rfl: 1 .  LORazepam (ATIVAN) 0.5 MG tablet, Take 1 tablet (0.5 mg total) by mouth daily as needed for anxiety., Disp: 30 tablet, Rfl: 0 .  omeprazole (PRILOSEC) 40 MG capsule, TAKE 1 CAPSULE BY MOUTH  DAILY (Patient taking differently: Take 40 mg by mouth daily before breakfast. ), Disp: 90 capsule, Rfl: 2  EXAM:  VITALS per patient if applicable:  GENERAL: alert, oriented, appears well and in no acute distress  HEENT: atraumatic, conjunttiva clear, no obvious abnormalities on inspection of external nose and ears  NECK: normal movements of the head and neck  LUNGS: on inspection no signs of respiratory distress, breathing rate appears normal, no obvious gross SOB, gasping or wheezing  CV: no obvious cyanosis  MS: moves all visible extremities without noticeable abnormality  PSYCH/NEURO: pleasant and cooperative, no obvious depression or anxiety, speech and thought processing grossly intact  ASSESSMENT AND PLAN:  Discussed the following assessment and plan:  Weakness - Plan: Ambulatory referral to [facility] order placed -Made some dietary suggestions to try to increase iron intake     I discussed the assessment and treatment plan with the patient. The patient was provided an opportunity to ask questions and all were answered. The patient agreed with the plan and demonstrated an understanding of the instructions.   The patient was advised to call back or seek an in-person evaluation if the symptoms worsen or if the condition fails to improve as anticipated.   Carolann Littler, MD

## 2019-03-18 ENCOUNTER — Other Ambulatory Visit: Payer: Medicare Other

## 2019-03-18 ENCOUNTER — Other Ambulatory Visit: Payer: Self-pay | Admitting: Gastroenterology

## 2019-03-18 ENCOUNTER — Ambulatory Visit: Payer: Medicare Other | Admitting: Hematology and Oncology

## 2019-03-19 ENCOUNTER — Inpatient Hospital Stay: Payer: Medicare Other | Admitting: Hematology and Oncology

## 2019-03-19 ENCOUNTER — Inpatient Hospital Stay: Payer: Medicare Other

## 2019-03-19 DIAGNOSIS — N3000 Acute cystitis without hematuria: Secondary | ICD-10-CM | POA: Diagnosis not present

## 2019-03-21 ENCOUNTER — Other Ambulatory Visit: Payer: Self-pay

## 2019-03-21 ENCOUNTER — Encounter (HOSPITAL_COMMUNITY): Payer: Self-pay | Admitting: Psychiatry

## 2019-03-21 ENCOUNTER — Ambulatory Visit (INDEPENDENT_AMBULATORY_CARE_PROVIDER_SITE_OTHER): Payer: Medicare Other | Admitting: Psychiatry

## 2019-03-21 DIAGNOSIS — F411 Generalized anxiety disorder: Secondary | ICD-10-CM

## 2019-03-21 DIAGNOSIS — F419 Anxiety disorder, unspecified: Secondary | ICD-10-CM | POA: Diagnosis not present

## 2019-03-21 DIAGNOSIS — F332 Major depressive disorder, recurrent severe without psychotic features: Secondary | ICD-10-CM | POA: Diagnosis not present

## 2019-03-21 MED ORDER — LORAZEPAM 0.5 MG PO TABS: 0.5000 mg | ORAL_TABLET | Freq: Every day | ORAL | 0 refills | Status: DC | PRN

## 2019-03-21 MED ORDER — ESCITALOPRAM OXALATE 20 MG PO TABS: 30.0000 mg | ORAL_TABLET | Freq: Every day | ORAL | 2 refills | Status: DC

## 2019-03-21 NOTE — Progress Notes (Signed)
Virtual Visit via Telephone Note  I connected with [name] on 03/21/19 at 10:40 AM EDT by telephone and verified that I am speaking with the correct person using two identifiers.   I discussed the limitations, risks, security and privacy concerns of performing an evaluation and management service by telephone and the availability of in person appointments. I also discussed with the patient that there may be a patient responsible charge related to this service. The patient expressed understanding and agreed to proceed.   History of Present Illness: Patient was evaluated by phone session.  Patient told past few weeks is very difficult because he had a surgery for his prostate and then after he had a complication with bleeding.  He also developed infection and fever.  Patient told he received blood transfusion because his hemoglobin was very low but now he is feeling better.  His last hemoglobin is 9.4.  He feels the medicine is working and does not feel as sad or hopeless but recall 3 weeks ago he was very anxious.  He admitted lack of appetite and lost 20 pounds.  Slowly and gradually his appetite is coming back.  He does not want to change medication because he feels it is working well.  He denies any crying spells or any feeling of hopelessness or worthlessness.  He is sleeping good.  Denies any paranoia, suicidal thoughts or homicidal thought.  He like to continue his current medication.    Past Psychiatric History: Reviewed. H/O depression and anxiety most of his life. Had a good response with TMS andLexapro. Tried Celexa, Pristiq and Cymbalta but limited outcome. PCP tried Klonopin but made him groggy.Noh/o suicidal attempt, mania, psychosis or self abusive behavior. Saw Dr. Cheryln Manly in the past for therapy.  Recent Results (from the past 2160 hour(s))  Urinalysis, Routine w reflex microscopic     Status: Abnormal   Collection Time: 02/13/19  3:52 PM  Result Value Ref Range    Color, Urine YELLOW YELLOW   APPearance CLOUDY (A) CLEAR   Specific Gravity, Urine 1.014 1.005 - 1.030   pH 7.0 5.0 - 8.0   Glucose, UA NEGATIVE NEGATIVE mg/dL   Hgb urine dipstick LARGE (A) NEGATIVE   Bilirubin Urine NEGATIVE NEGATIVE   Ketones, ur NEGATIVE NEGATIVE mg/dL   Protein, ur 30 (A) NEGATIVE mg/dL   Nitrite NEGATIVE NEGATIVE   Leukocytes,Ua SMALL (A) NEGATIVE   RBC / HPF >50 (H) 0 - 5 RBC/hpf   WBC, UA 21-50 0 - 5 WBC/hpf   Bacteria, UA FEW (A) NONE SEEN   Mucus PRESENT     Comment: Performed at [facility], [place] [address] [place], [place] [address]  Urine culture     Status: Abnormal   Collection Time: 02/13/19  3:52 PM  Result Value Ref Range   Specimen Description      URINE, RANDOM Performed at [place] [address], [place], [place] [address]    Special Requests      NONE Performed at [facility], [place] [address] [place], [place] [address]    Culture 80,000 COLONIES/mL STAPHYLOCOCCUS EPIDERMIDIS (A)    Report Status 02/16/2019 FINAL    Organism ID, Bacteria STAPHYLOCOCCUS EPIDERMIDIS (A)       Susceptibility   Staphylococcus epidermidis - MIC*    CIPROFLOXACIN <=0.5 SENSITIVE Sensitive     GENTAMICIN <=0.5 SENSITIVE Sensitive     NITROFURANTOIN <=16 SENSITIVE Sensitive     OXACILLIN >=4 RESISTANT Resistant  TETRACYCLINE 4 SENSITIVE Sensitive     VANCOMYCIN 1 SENSITIVE Sensitive     TRIMETH/SULFA >=320 RESISTANT Resistant     CLINDAMYCIN <=0.25 SENSITIVE Sensitive     RIFAMPIN <=0.5 SENSITIVE Sensitive     Inducible Clindamycin NEGATIVE Sensitive     * 80,000 COLONIES/mL STAPHYLOCOCCUS EPIDERMIDIS  Urinalysis, Routine w reflex microscopic     Status: Abnormal   Collection Time: 02/16/19  3:19 PM  Result Value Ref Range   Color, Urine YELLOW YELLOW   APPearance CLEAR CLEAR   Specific Gravity, Urine 1.014 1.005 - 1.030   pH 7.0 5.0 - 8.0   Glucose, UA NEGATIVE NEGATIVE mg/dL   Hgb  urine dipstick MODERATE (A) NEGATIVE   Bilirubin Urine NEGATIVE NEGATIVE   Ketones, ur 5 (A) NEGATIVE mg/dL   Protein, ur NEGATIVE NEGATIVE mg/dL   Nitrite NEGATIVE NEGATIVE   Leukocytes,Ua LARGE (A) NEGATIVE   RBC / HPF 11-20 0 - 5 RBC/hpf   WBC, UA 21-50 0 - 5 WBC/hpf   Bacteria, UA RARE (A) NONE SEEN   Mucus PRESENT     Comment: Performed at [facility], [place] [address] [place], [place] [address]  Basic metabolic panel     Status: Abnormal   Collection Time: 02/20/19  1:25 PM  Result Value Ref Range   Sodium 133 (L) 135 - 145 mmol/L   Potassium 4.3 3.5 - 5.1 mmol/L   Chloride 100 98 - 111 mmol/L   CO2 26 22 - 32 mmol/L   Glucose, Bld 92 70 - 99 mg/dL   BUN 20 8 - 23 mg/dL   Creatinine, Ser 0.71 0.61 - 1.24 mg/dL   Calcium 8.8 (L) 8.9 - 10.3 mg/dL   GFR calc non Af Amer >60 >60 mL/min   GFR calc Af Amer >60 >60 mL/min   Anion gap 7 5 - 15    Comment: Performed at [facility], [place] [address] [place], [place] [address]  CBC     Status: None   Collection Time: 02/20/19  1:25 PM  Result Value Ref Range   WBC 6.4 4.0 - 10.5 K/uL   RBC 4.91 4.22 - 5.81 MIL/uL   Hemoglobin 13.0 13.0 - 17.0 g/dL   HCT 40.3 39.0 - 52.0 %   MCV 82.1 80.0 - 100.0 fL   MCH 26.5 26.0 - 34.0 pg   MCHC 32.3 30.0 - 36.0 g/dL   RDW 13.9 11.5 - 15.5 %   Platelets 217 150 - 400 K/uL   nRBC 0.0 0.0 - 0.2 %    Comment: Performed at [facility], [place] [address] [place], [place] [address]  Hemoglobin A1c     Status: Abnormal   Collection Time: 02/20/19  1:25 PM  Result Value Ref Range   Hgb A1c MFr Bld 5.7 (H) 4.8 - 5.6 %    Comment: (NOTE) Pre diabetes:          5.7%-6.4% Diabetes:              >6.4% Glycemic control for   <7.0% adults with diabetes    Mean Plasma Glucose 116.89 mg/dL    Comment: Performed at [place] [address], [place], [place] [address]  Hemoglobin and hematocrit, blood     Status: Abnormal   Collection  Time: 02/22/19 10:53 AM  Result Value Ref Range   Hemoglobin 10.5 (L) 13.0 - 17.0 g/dL   HCT 34.1 (L) 39.0 - 52.0 %  Comment: Performed at [facility], [place] [address] [place], [place] [address]  Prepare RBC     Status: None   Collection Time: 02/22/19  3:30 PM  Result Value Ref Range   Order Confirmation      ORDER PROCESSED BY BLOOD BANK Performed at [place] [address], [place], [place] [address]   Hemoglobin and hematocrit, blood     Status: Abnormal   Collection Time: 02/22/19  3:30 PM  Result Value Ref Range   Hemoglobin 9.4 (L) 13.0 - 17.0 g/dL   HCT 29.1 (L) 39.0 - 52.0 %    Comment: Performed at [facility], [place] [address], [place], [place] [address]  Type and screen [place]     Status: None   Collection Time: 02/22/19  3:33 PM  Result Value Ref Range   ABO/RH(D) O POS    Antibody Screen NEG    Sample Expiration 02/25/2019    Unit Number [id number]    Blood Component Type RED CELLS,LR    Unit division 00    Status of Unit ISSUED,FINAL    Transfusion Status OK TO TRANSFUSE    Crossmatch Result Compatible    Unit Number [id number]    Blood Component Type RED CELLS,LR    Unit division 00    Status of Unit ISSUED,FINAL    Transfusion Status OK TO TRANSFUSE    Crossmatch Result Compatible    Unit Number [id number]    Blood Component Type RED CELLS,LR    Unit division 00    Status of Unit ISSUED,FINAL    Transfusion Status OK TO TRANSFUSE    Crossmatch Result      Compatible Performed at [facility], [place] [address], [place], [place] [address]   BPAM RBC     Status: None   Collection Time: 02/22/19  3:33 PM  Result Value Ref Range   ISSUE DATE / TIME 726203559741    Blood Product Unit Number [id number]    PRODUCT CODE [id number]    Unit Type and Rh 5100    Blood Product Expiration Date 122482500370    ISSUE DATE / TIME [id number]    Blood  Product Unit Number [id number]    PRODUCT CODE [id number]    Unit Type and Rh 9500    Blood Product Expiration Date 480165537482    ISSUE DATE / TIME 707867544920    Blood Product Unit Number [id number]    PRODUCT CODE [id number]    Unit Type and Rh 5100    Blood Product Expiration Date 158309407680   ABO/Rh     Status: None   Collection Time: 02/22/19  4:33 PM  Result Value Ref Range   ABO/RH(D)      O POS Performed at [facility], [place] [address], [place], [place] [address]   Basic metabolic panel     Status: Abnormal   Collection Time: 02/23/19  1:26 AM  Result Value Ref Range   Sodium 134 (L) 135 - 145 mmol/L   Potassium 4.0 3.5 - 5.1 mmol/L   Chloride 108 98 - 111 mmol/L   CO2 14 (L) 22 - 32 mmol/L   Glucose, Bld 381 (H) 70 - 99 mg/dL   BUN 29 (H) 8 - 23 mg/dL   Creatinine, Ser 2.00 (H) 0.61 - 1.24 mg/dL   Calcium 7.0 (L) 8.9 - 10.3 mg/dL   GFR calc non Af Amer 31 (L) >60 mL/min   GFR  calc Af Amer 35 (L) >60 mL/min   Anion gap 12 5 - 15    Comment: Performed at [facility], [place] [address], [place], [place] [address]  Hemoglobin and hematocrit, blood     Status: Abnormal   Collection Time: 02/23/19  1:26 AM  Result Value Ref Range   Hemoglobin 6.9 (LL) 13.0 - 17.0 g/dL    Comment: REPEATED TO VERIFY DELTA CHECK NOTED THIS CRITICAL RESULT HAS VERIFIED AND BEEN CALLED TO J,SUTTON BY AISHA MOHAMED ON 04 25 2020 AT 0201, AND HAS BEEN READ BACK.     HCT 22.8 (L) 39.0 - 52.0 %    Comment: Performed at [facility], [place] [address], [place], [place] [address]  Prepare RBC     Status: None   Collection Time: 02/23/19  2:44 AM  Result Value Ref Range   Order Confirmation      ORDER PROCESSED BY BLOOD BANK Performed at [facility], [place] [address] [place], [place] [address]   Glucose, capillary     Status: Abnormal   Collection Time: 02/23/19  7:32 AM  Result Value Ref Range   Glucose-Capillary  142 (H) 70 - 99 mg/dL  Hemoglobin and hematocrit, blood     Status: Abnormal   Collection Time: 02/23/19  9:21 AM  Result Value Ref Range   Hemoglobin 8.2 (L) 13.0 - 17.0 g/dL   HCT 25.3 (L) 39.0 - 52.0 %    Comment: Performed at [facility], [place] [address] [place], [place] [address]  Hemoglobin and hematocrit, blood     Status: Abnormal   Collection Time: 02/23/19  2:41 PM  Result Value Ref Range   Hemoglobin 7.8 (L) 13.0 - 17.0 g/dL   HCT 23.7 (L) 39.0 - 52.0 %    Comment: Performed at [facility], [place] [address], [place], [place] [address]  Hemoglobin and hematocrit, blood     Status: Abnormal   Collection Time: 02/24/19  5:20 AM  Result Value Ref Range   Hemoglobin 7.1 (L) 13.0 - 17.0 g/dL   HCT 21.8 (L) 39.0 - 52.0 %    Comment: Performed at [facility], [place] [address], [place], [place] [address]  Basic metabolic panel     Status: Abnormal   Collection Time: 02/24/19  5:20 AM  Result Value Ref Range   Sodium 132 (L) 135 - 145 mmol/L   Potassium 3.6 3.5 - 5.1 mmol/L   Chloride 108 98 - 111 mmol/L   CO2 21 (L) 22 - 32 mmol/L   Glucose, Bld 119 (H) 70 - 99 mg/dL   BUN 22 8 - 23 mg/dL   Creatinine, Ser 1.04 0.61 - 1.24 mg/dL   Calcium 7.4 (L) 8.9 - 10.3 mg/dL   GFR calc non Af Amer >60 >60 mL/min   GFR calc Af Amer >60 >60 mL/min   Anion gap 3 (L) 5 - 15    Comment: Performed at [facility], [place] [address], [place],  [address]  Creatinine, fluid (pleural, peritoneal, JP Drainage)     Status: None   Collection Time: 02/24/19  8:50 AM  Result Value Ref Range   Creat, Fluid 0.9 mg/dL    Comment: (NOTE) No normal range established for this test Results should be evaluated in conjunction with serum values    Fluid Type-FCRE JP DRAINAGE     Comment: Performed at [facility], [place] [address] [place],  [place] [address]  Hemoglobin and hematocrit, blood     Status: Abnormal    Collection Time: 02/25/19  5:12 AM  Result Value Ref Range   Hemoglobin 7.1 (L) 13.0 - 17.0 g/dL   HCT 21.4 (L) 39.0 - 52.0 %    Comment: Performed at [facility], [place] [address], [place], [place] [address]  Basic metabolic panel     Status: Abnormal   Collection Time: 02/25/19  5:12 AM  Result Value Ref Range   Sodium 135 135 - 145 mmol/L   Potassium 2.9 (L) 3.5 - 5.1 mmol/L    Comment: REPEATED TO VERIFY DELTA CHECK NOTED    Chloride 107 98 - 111 mmol/L   CO2 22 22 - 32 mmol/L   Glucose, Bld 120 (H) 70 - 99 mg/dL   BUN 11 8 - 23 mg/dL   Creatinine, Ser 0.85 0.61 - 1.24 mg/dL   Calcium 7.5 (L) 8.9 - 10.3 mg/dL   GFR calc non Af Amer >60 >60 mL/min   GFR calc Af Amer >60 >60 mL/min   Anion gap 6 5 - 15    Comment: Performed at [facility], [place] [address], [place], [place] [address]  Prepare RBC     Status: None   Collection Time: 02/25/19  8:08 AM  Result Value Ref Range   Order Confirmation      ORDER PROCESSED BY BLOOD BANK Performed at [facility], [place] [address] [place], [place] [address]   Hemoglobin and hematocrit, blood     Status: Abnormal   Collection Time: 02/25/19  4:32 PM  Result Value Ref Range   Hemoglobin 8.5 (L) 13.0 - 17.0 g/dL   HCT 25.1 (L) 39.0 - 52.0 %    Comment: Performed at [facility], [place] [address], [place], [place] [address]  Prepare RBC     Status: None   Collection Time: 02/25/19  5:08 PM  Result Value Ref Range   Order Confirmation      ORDER PROCESSED BY BLOOD BANK Performed at [place] [address] [place], [place] [address]   Hemoglobin and hematocrit, blood     Status: Abnormal   Collection Time: 02/26/19  5:11 AM  Result Value Ref Range   Hemoglobin 8.9 (L) 13.0 - 17.0 g/dL   HCT 27.5 (L) 39.0 - 52.0 %    Comment: Performed at [facility], [place] [address], [place], [place] [address]  Basic metabolic panel      Status: Abnormal   Collection Time: 02/26/19  5:11 AM  Result Value Ref Range   Sodium 136 135 - 145 mmol/L   Potassium 3.1 (L) 3.5 - 5.1 mmol/L   Chloride 109 98 - 111 mmol/L   CO2 23 22 - 32 mmol/L   Glucose, Bld 116 (H) 70 - 99 mg/dL   BUN 11 8 - 23 mg/dL   Creatinine, Ser 0.69 0.61 - 1.24 mg/dL   Calcium 7.1 (L) 8.9 - 10.3 mg/dL   GFR calc non Af Amer >60 >60 mL/min   GFR calc Af Amer >60 >60 mL/min   Anion gap 4 (L) 5 - 15    Comment: Performed at [facility], [place] [address] [place], [place] [address]  Hemoglobin and hematocrit, blood     Status: Abnormal   Collection Time: 02/27/19  5:31 AM  Result Value Ref Range   Hemoglobin 9.4 (L) 13.0 - 17.0 g/dL  HCT 28.2 (L) 39.0 - 52.0 %    Comment: Performed at [facility], [place] [address], [place], [place] [address]     Psychiatric Specialty Exam: Physical Exam  ROS  There were no vitals taken for this visit.There is no height or weight on file to calculate BMI.  General Appearance: NA  Eye Contact:  NA  Speech:  Clear and Coherent  Volume:  Normal  Mood:  Anxious  Affect:  NA  Thought Process:  Goal Directed  Orientation:  Full (Time, Place, and Person)  Thought Content:  Logical  Suicidal Thoughts:  No  Homicidal Thoughts:  No  Memory:  Immediate;   Good Recent;   Good Remote;   Good  Judgement:  Good  Insight:  Good  Psychomotor Activity:  NA  Concentration:  Concentration: Good and Attention Span: Good  Recall:  Good  Fund of Knowledge:  Good  Language:  Good  Akathisia:  No  Handed:  Right  AIMS (if indicated):     Assets:  Communication Skills Desire for Improvement Housing Resilience Social Support  ADL's:  Intact  Cognition:  WNL  Sleep:         Assessment and Plan: Major depressive disorder, recurrent.  Generalized anxiety disorder.  I reviewed his blood work results current medication.  Despite recently a lot of health issues and difficult surgery  and complications he is doing better.  He does not want to change medication.  He like to continue Lexapro 30 mg daily.  He reported no tremors shakes or any EPS.  Recommend to take Ativan as needed for severe anxiety.  I offered therapy but patient feel that he is handling situation better and if needed then he will call us.  I recommend to call us back if is any question or any concern.  Follow-up in 3 months.  Follow Up Instructions:    I discussed the assessment and treatment plan with the patient. The patient was provided an opportunity to ask questions and all were answered. The patient agreed with the plan and demonstrated an understanding of the instructions.   The patient was advised to call back or seek an in-person evaluation if the symptoms worsen or if the condition fails to improve as anticipated.  I provided 20 minutes of non-face-to-face time during this encounter.   Kathlee Nations, MD

## 2019-03-25 NOTE — Progress Notes (Signed)
Patient Care Team: [name], MD as PCP - General (Family Medicine) Juanita Craver, MD as Consulting Physician (Gastroenterology) Calvert Cantor, MD as Consulting Physician (Ophthalmology) Adele Schilder Arlyce Harman, MD as Consulting Physician (Psychiatry) Nicholas Lose, MD as Consulting Physician (Hematology and Oncology)  DIAGNOSIS:    ICD-10-CM   1. Iron deficiency anemia, unspecified iron deficiency anemia type D50.9     CHIEF COMPLIANT: Follow-up of iron deficiency anemia after recent hospitalization, profound fatigue  INTERVAL HISTORY: [name] is a 83 y.o. with above-mentioned history of iron deficiency anemia treated with IV iron therapy. On 02/22/19 he was admitted to the hospital for a prostatectomy due to urinary retention and recurrent infections. His Hg was 13.0 prior to surgery, but following surgery he had significant hematuria and was given 4 units PRBCs.  He also had fevers that lasted several weeks and have the just subsided.  His most recent labs on 03/08/19 showed Hg 10.8, hematocrit 23.0, ferritin 552. He presents to the clinic today for follow-up of anemia.   REVIEW OF SYSTEMS:   Constitutional: Denies fevers, chills or abnormal weight loss Eyes: Denies blurriness of vision Ears, nose, mouth, throat, and face: Denies mucositis or sore throat Respiratory: Denies cough, dyspnea or wheezes Cardiovascular: Denies palpitation, chest discomfort Gastrointestinal: Denies nausea, heartburn or change in bowel habits Skin: Denies abnormal skin rashes Lymphatics: Denies new lymphadenopathy or easy bruising Neurological: Denies numbness, tingling or new weaknesses Behavioral/Psych: Mood is stable, no new changes  Extremities: No lower extremity edema All other systems were reviewed with the patient and are negative.  I have reviewed the past medical history, past surgical history, social history and family history with the patient and they are unchanged from previous  note.  ALLERGIES:  has No Known Allergies.  MEDICATIONS:  Current Outpatient Medications  Medication Sig Dispense Refill  . acetaminophen (TYLENOL) 500 MG tablet Take 1,000 mg by mouth every 6 (six) hours as needed for moderate pain or headache.    Marland Kitchen amLODipine (NORVASC) 5 MG tablet TAKE 1 TABLET BY MOUTH DAILY (Patient taking differently: Take 5 mg by mouth daily. ) 90 tablet 1  . cyanocobalamin 2000 MCG tablet Take 2,000 mcg by mouth daily.    Marland Kitchen escitalopram (LEXAPRO) 20 MG tablet Take 1.5 tablets (30 mg total) by mouth daily. 45 tablet 2  . finasteride (PROSCAR) 5 MG tablet Take 5 mg by mouth daily.    . hydrocortisone 2.5 % cream Apply topically 2 (two) times daily as needed. (Patient taking differently: Apply 1 application topically 2 (two) times daily as needed (rash). ) 30 g 1  . LORazepam (ATIVAN) 0.5 MG tablet Take 1 tablet (0.5 mg total) by mouth daily as needed for anxiety. 30 tablet 0  . omeprazole (PRILOSEC) 40 MG capsule TAKE 1 CAPSULE BY MOUTH  DAILY (Patient taking differently: Take 40 mg by mouth daily before breakfast. ) 90 capsule 2   No current facility-administered medications for this visit.     PHYSICAL EXAMINATION: ECOG PERFORMANCE STATUS: 1 - Symptomatic but completely ambulatory  Vitals:   03/26/19 1352  BP: 130/62  Pulse: 73  Resp: 16  Temp: 98.3 F (36.8 C)  SpO2: 100%   Filed Weights   03/26/19 1352  Weight: 130 lb 1.6 oz (59 kg)    GENERAL: alert, no distress and comfortable SKIN: skin color, texture, turgor are normal, no rashes or significant lesions EYES: normal, Conjunctiva are pink and non-injected, sclera clear OROPHARYNX: no exudate, no erythema and lips,  buccal mucosa, and tongue normal  NECK: supple, thyroid normal size, non-tender, without nodularity LYMPH: no palpable lymphadenopathy in the cervical, axillary or inguinal LUNGS: clear to auscultation and percussion with normal breathing effort HEART: regular rate & rhythm and no  murmurs and no lower extremity edema ABDOMEN: abdomen soft, non-tender and normal bowel sounds MUSCULOSKELETAL: no cyanosis of digits and no clubbing  NEURO: alert & oriented x 3 with fluent speech, no focal motor/sensory deficits EXTREMITIES: No lower extremity edema  LABORATORY DATA:  I have reviewed the data as listed CMP Latest Ref Rng & Units 02/26/2019 02/25/2019 02/24/2019  Glucose 70 - 99 mg/dL 116(H) 120(H) 119(H)  BUN 8 - 23 mg/dL 11 11 22   Creatinine 0.61 - 1.24 mg/dL 0.69 0.85 1.04  Sodium 135 - 145 mmol/L 136 135 132(L)  Potassium 3.5 - 5.1 mmol/L 3.1(L) 2.9(L) 3.6  Chloride 98 - 111 mmol/L 109 107 108  CO2 22 - 32 mmol/L 23 22 21(L)  Calcium 8.9 - 10.3 mg/dL 7.1(L) 7.5(L) 7.4(L)  Total Protein 6.1 - 8.1 g/dL - - -  Total Bilirubin 0.2 - 1.2 mg/dL - - -  Alkaline Phos 39 - 117 U/L - - -  AST 10 - 35 U/L - - -  ALT 9 - 46 U/L - - -    Lab Results  Component Value Date   WBC 8.3 03/26/2019   HGB 10.9 (L) 03/26/2019   HCT 35.5 (L) 03/26/2019   MCV 88.1 03/26/2019   PLT 385 03/26/2019   NEUTROABS 5.0 03/26/2019    ASSESSMENT & PLAN:  Iron deficiency anemia Patient has had upper endoscopies and colonoscopies and did not have any clear identified source of blood loss.  However malabsorption is also a possibility. Patient is intolerant oforal iron therapy. Prior treatment: IV iron therapy: 07/13/2018 2 doses  Patient was hospitalized recently with profound anemia and received 3 to 4 units of packed red cells.  His prostate was removed and he had profound postoperative bleeding. Dr. Collene Mares performed a CBC at her office which showed a hemoglobin of 10.8 on 03/07/2019.  Lab review:  03/26/2019: Hemoglobin 10.9, RDW 16.9, MCV 88 reticulocyte count: 80.6, reticulocyte percentage, 2% I will call the patient with results of iron studies tomorrow.  Return to clinic in 6 months with labs and follow-up    No orders of the defined types were placed in this encounter.  The  patient has a good understanding of the overall plan. he agrees with it. he will call with any problems that may develop before the next visit here.  Nicholas Lose, MD 03/26/2019  Julious Oka Dorshimer am acting as scribe for Dr. Nicholas Lose.  I have reviewed the above documentation for accuracy and completeness, and I agree with the above.

## 2019-03-26 ENCOUNTER — Other Ambulatory Visit: Payer: Self-pay

## 2019-03-26 ENCOUNTER — Inpatient Hospital Stay (HOSPITAL_BASED_OUTPATIENT_CLINIC_OR_DEPARTMENT_OTHER): Payer: Medicare Other | Admitting: Hematology and Oncology

## 2019-03-26 ENCOUNTER — Inpatient Hospital Stay: Payer: Medicare Other | Attending: Hematology and Oncology

## 2019-03-26 DIAGNOSIS — Z79899 Other long term (current) drug therapy: Secondary | ICD-10-CM | POA: Insufficient documentation

## 2019-03-26 DIAGNOSIS — D509 Iron deficiency anemia, unspecified: Secondary | ICD-10-CM | POA: Diagnosis not present

## 2019-03-26 LAB — CMP (CANCER CENTER ONLY)
ALT: 25 U/L (ref 0–44)
AST: 15 U/L (ref 15–41)
Albumin: 3.1 g/dL — ABNORMAL LOW (ref 3.5–5.0)
Alkaline Phosphatase: 80 U/L (ref 38–126)
Anion gap: 7 (ref 5–15)
BUN: 19 mg/dL (ref 8–23)
CO2: 26 mmol/L (ref 22–32)
Calcium: 8.6 mg/dL — ABNORMAL LOW (ref 8.9–10.3)
Chloride: 106 mmol/L (ref 98–111)
Creatinine: 0.84 mg/dL (ref 0.61–1.24)
GFR, Est AFR Am: >60 mL/min (ref >60)
GFR, Estimated: >60 mL/min (ref >60)
Glucose, Bld: 137 mg/dL — ABNORMAL HIGH (ref 70–99)
Potassium: 4.4 mmol/L (ref 3.5–5.1)
Sodium: 139 mmol/L (ref 135–145)
Total Bilirubin: 0.3 mg/dL (ref 0.3–1.2)
Total Protein: 6.7 g/dL (ref 6.5–8.1)

## 2019-03-26 LAB — CBC WITH DIFFERENTIAL (CANCER CENTER ONLY)
Abs Immature Granulocytes: 0.03 10*3/uL (ref 0.00–0.07)
Basophils Absolute: 0 10*3/uL (ref 0.0–0.1)
Basophils Relative: 1 %
Eosinophils Absolute: 0.1 10*3/uL (ref 0.0–0.5)
Eosinophils Relative: 1 %
HCT: 35.5 % — ABNORMAL LOW (ref 39.0–52.0)
Hemoglobin: 10.9 g/dL — ABNORMAL LOW (ref 13.0–17.0)
Immature Granulocytes: 0 %
Lymphocytes Relative: 29 %
Lymphs Abs: 2.4 10*3/uL (ref 0.7–4.0)
MCH: 27 pg (ref 26.0–34.0)
MCHC: 30.7 g/dL (ref 30.0–36.0)
MCV: 88.1 fL (ref 80.0–100.0)
Monocytes Absolute: 0.7 10*3/uL (ref 0.1–1.0)
Monocytes Relative: 9 %
Neutro Abs: 5 10*3/uL (ref 1.7–7.7)
Neutrophils Relative %: 60 %
Platelet Count: 385 10*3/uL (ref 150–400)
RBC: 4.03 MIL/uL — ABNORMAL LOW (ref 4.22–5.81)
RDW: 16.9 % — ABNORMAL HIGH (ref 11.5–15.5)
WBC Count: 8.3 10*3/uL (ref 4.0–10.5)
nRBC: 0 % (ref 0.0–0.2)

## 2019-03-26 LAB — RETIC PANEL
Immature Retic Fract: 12.2 % (ref 2.3–15.9)
RBC.: 4.03 MIL/uL — ABNORMAL LOW (ref 4.22–5.81)
Retic Count, Absolute: 80.6 10*3/uL (ref 19.0–186.0)
Retic Ct Pct: 2 % (ref 0.4–3.1)
Reticulocyte Hemoglobin: 29.4 pg (ref >27.9)

## 2019-03-26 LAB — FERRITIN: Ferritin: 335 ng/mL (ref 24–336)

## 2019-03-26 LAB — IRON AND TIBC
Iron: 48 ug/dL (ref 42–163)
Saturation Ratios: 19 % — ABNORMAL LOW (ref 20–55)
TIBC: 256 ug/dL (ref 202–409)
UIBC: 209 ug/dL (ref 117–376)

## 2019-03-26 NOTE — Assessment & Plan Note (Signed)
Patient has had upper endoscopies and colonoscopies and did not have any clear identified source of blood loss.  However malabsorption is also a possibility. Patient is intolerant oforal iron therapy. Prior treatment: IV iron therapy: 07/13/2018 2 doses  Patient was hospitalized recently with profound anemia and received 3 to 4 units of packed red cells.  His prostate was removed and he had profound postoperative bleeding. Dr. Collene Mares performed a CBC at her office which showed a hemoglobin of 10.8 on 03/07/2019.  Lab review:   Return to clinic in 3 months with labs and follow-up

## 2019-03-28 ENCOUNTER — Telehealth: Payer: Self-pay | Admitting: Hematology and Oncology

## 2019-03-28 NOTE — Telephone Encounter (Signed)
Called regarding schedule °

## 2019-04-03 DIAGNOSIS — R3915 Urgency of urination: Secondary | ICD-10-CM | POA: Diagnosis not present

## 2019-05-28 DIAGNOSIS — N281 Cyst of kidney, acquired: Secondary | ICD-10-CM | POA: Diagnosis not present

## 2019-06-04 DIAGNOSIS — R3915 Urgency of urination: Secondary | ICD-10-CM | POA: Diagnosis not present

## 2019-06-04 DIAGNOSIS — R8271 Bacteriuria: Secondary | ICD-10-CM | POA: Diagnosis not present

## 2019-06-20 ENCOUNTER — Encounter (HOSPITAL_COMMUNITY): Payer: Self-pay | Admitting: Psychiatry

## 2019-06-20 ENCOUNTER — Ambulatory Visit (INDEPENDENT_AMBULATORY_CARE_PROVIDER_SITE_OTHER): Payer: Medicare Other | Admitting: Psychiatry

## 2019-06-20 ENCOUNTER — Other Ambulatory Visit: Payer: Self-pay

## 2019-06-20 DIAGNOSIS — F332 Major depressive disorder, recurrent severe without psychotic features: Secondary | ICD-10-CM

## 2019-06-20 DIAGNOSIS — F419 Anxiety disorder, unspecified: Secondary | ICD-10-CM

## 2019-06-20 MED ORDER — ESCITALOPRAM OXALATE 20 MG PO TABS: 30.0000 mg | ORAL_TABLET | Freq: Every day | ORAL | 2 refills | Status: DC

## 2019-06-20 NOTE — Progress Notes (Signed)
Virtual Visit via Telephone Note  I connected with [name] on 06/20/19 at  2:20 PM EDT by telephone and verified that I am speaking with the correct person using two identifiers.   I discussed the limitations, risks, security and privacy concerns of performing an evaluation and management service by telephone and the availability of in person appointments. I also discussed with the patient that there may be a patient responsible charge related to this service. The patient expressed understanding and agreed to proceed.   History of Present Illness: Patient was evaluated by phone session.  He endorsed few weeks ago he was very sad, anxious and having nervousness due to his brothers anniversary who died year ago.  He took Ativan for few days then he decided to stop it because he do not want to be dependent on it.  He is feeling better now.  Slowly and gradually he is getting energy and his hemoglobin is slowly improving.  His appetite is also improved and he had gained few pounds since the last visit.  He is a still 10 pounds less then he supposed to be.  He feels the medicine is working.  He has no tremors, shakes or any EPS.  His appetite is coming back.  His energy level is improving.  He denies any crying spells or any feeling of hopelessness or worthlessness.  He lives with his wife who is very supportive.  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  He enjoys reading and listening.  He is sleeping good.  He has no tremors, shakes or any EPS.  He wants to continue Lexapro.  He has lorazepam and he does not need a new refill.  Past Psychiatric History:Reviewed. H/Odepression and anxiety most of his life. Had a good response with TMS andLexapro. Tried Celexa, Pristiq and Cymbalta but limited outcome. PCP tried Klonopin but made him groggy.Noh/osuicidal attempt, mania, psychosis or self abusive behavior. Saw Dr. Cheryln Manly in the  past for therapy.    Psychiatric Specialty Exam: Physical Exam  ROS  There were no vitals taken for this visit.There is no height or weight on file to calculate BMI.  General Appearance: NA  Eye Contact:  NA  Speech:  Clear and Coherent and Normal Rate  Volume:  Normal  Mood:  Euthymic  Affect:  NA  Thought Process:  Goal Directed  Orientation:  Full (Time, Place, and Person)  Thought Content:  Logical  Suicidal Thoughts:  No  Homicidal Thoughts:  No  Memory:  Immediate;   Good Recent;   Good Remote;   Good  Judgement:  Good  Insight:  Good  Psychomotor Activity:  NA  Concentration:  Concentration: Good and Attention Span: Good  Recall:  Good  Fund of Knowledge:  Good  Language:  Good  Akathisia:  No  Handed:  Right  AIMS (if indicated):     Assets:  Communication Skills Desire for Improvement Housing Resilience Social Support Talents/Skills Transportation  ADL's:  Intact  Cognition:  WNL  Sleep:         Assessment and Plan: Major depressive disorder, recurrent.  Anxiety.  Patient is a stable on Lexapro 30 mg.  Slowly and gradually his strength, energy is coming back.  He had a prostate surgery and had a lot of complication and blood loss.  Patient does not require Ativan and he still has refill remaining.  However he like to continue Lexapro  30 mg which is helping his anxiety and depression.  Discussed medication side effects and benefits.  He is not interested in therapy.  Recommended to call us back if he has any question or any concern.  Encourage to engage in his hobby which is literature and poetry.  Follow-up in 3 months.  Follow Up Instructions:    I discussed the assessment and treatment plan with the patient. The patient was provided an opportunity to ask questions and all were answered. The patient agreed with the plan and demonstrated an understanding of the instructions.   The patient was advised to call back or seek an in-person evaluation if the  symptoms worsen or if the condition fails to improve as anticipated.  I provided 20 minutes of non-face-to-face time during this encounter.   Kathlee Nations, MD

## 2019-07-03 DIAGNOSIS — R310 Gross hematuria: Secondary | ICD-10-CM | POA: Diagnosis not present

## 2019-07-03 DIAGNOSIS — R8271 Bacteriuria: Secondary | ICD-10-CM | POA: Diagnosis not present

## 2019-07-03 DIAGNOSIS — R350 Frequency of micturition: Secondary | ICD-10-CM | POA: Diagnosis not present

## 2019-07-18 ENCOUNTER — Other Ambulatory Visit (HOSPITAL_COMMUNITY): Payer: Self-pay | Admitting: Psychiatry

## 2019-07-18 DIAGNOSIS — F332 Major depressive disorder, recurrent severe without psychotic features: Secondary | ICD-10-CM

## 2019-08-01 ENCOUNTER — Other Ambulatory Visit: Payer: Self-pay | Admitting: Family Medicine

## 2019-08-08 ENCOUNTER — Other Ambulatory Visit (HOSPITAL_COMMUNITY): Payer: Self-pay

## 2019-08-08 DIAGNOSIS — F419 Anxiety disorder, unspecified: Secondary | ICD-10-CM

## 2019-08-08 MED ORDER — LORAZEPAM 0.5 MG PO TABS: 0.5000 mg | ORAL_TABLET | Freq: Every day | ORAL | 0 refills | Status: DC | PRN

## 2019-08-18 ENCOUNTER — Other Ambulatory Visit: Payer: Self-pay | Admitting: Family Medicine

## 2019-09-18 ENCOUNTER — Encounter (HOSPITAL_COMMUNITY): Payer: Self-pay | Admitting: Psychiatry

## 2019-09-18 ENCOUNTER — Other Ambulatory Visit: Payer: Self-pay

## 2019-09-18 ENCOUNTER — Ambulatory Visit (INDEPENDENT_AMBULATORY_CARE_PROVIDER_SITE_OTHER): Payer: Medicare Other | Admitting: Psychiatry

## 2019-09-18 DIAGNOSIS — F419 Anxiety disorder, unspecified: Secondary | ICD-10-CM | POA: Diagnosis not present

## 2019-09-18 DIAGNOSIS — F332 Major depressive disorder, recurrent severe without psychotic features: Secondary | ICD-10-CM

## 2019-09-18 MED ORDER — ESCITALOPRAM OXALATE 20 MG PO TABS: 30.0000 mg | ORAL_TABLET | Freq: Every day | ORAL | 2 refills | Status: DC

## 2019-09-18 MED ORDER — LORAZEPAM 0.5 MG PO TABS: 0.5000 mg | ORAL_TABLET | Freq: Every day | ORAL | 0 refills | Status: DC | PRN

## 2019-09-18 NOTE — Progress Notes (Signed)
Virtual Visit via Telephone Note  I connected with [name] on 09/18/19 at  2:40 PM EST by telephone and verified that I am speaking with the correct person using two identifiers.   I discussed the limitations, risks, security and privacy concerns of performing an evaluation and management service by telephone and the availability of in person appointments. I also discussed with the patient that there may be a patient responsible charge related to this service. The patient expressed understanding and agreed to proceed.   History of Present Illness: Patient was evaluated by phone session.  Overall he described his mood is better.  His depression is under control but he continues to get anxiety spells which usually last week or 10 days.  He takes lorazepam which helps and his anxiety subsided.  He does not want to take lorazepam every day.  Overall he feels things are going well.  His general health is also much better.  He gained few pounds since the last visit.  He is taking Lexapro 30 mg and reported no tremors, shakes or any EPS.  Slowly and gradually his energy level is improved from the past.  He sleeps all night.  Denies any major panic attack, crying spells or any feeling of hopelessness or worthlessness.  He lives with his wife who is very supportive.  He started writing poetry little bit but most of the time he is spending reading the poetry.  He wants to continue current medication.  He is not interested in therapy.    Past Psychiatric History:Reviewed. H/Odepression and anxiety most of his life. Had a good response with TMS andLexapro. Tried Celexa, Pristiq and Cymbalta but limited outcome. PCP tried Klonopin but made him groggy.Noh/osuicidal attempt, mania, psychosis or self abusive behavior. Saw Dr. Cheryln Manly in the past for therapy.  Past Psychiatric History:Reviewed. H/Odepression and anxiety most of his life. Had a good response with TMS andLexapro. Tried Celexa,  Pristiq and Cymbalta but limited outcome. PCP tried Klonopin but made him groggy.Noh/osuicidal attempt, mania, psychosis or self abusive behavior. Saw Dr. Cheryln Manly in the past for therapy.   Psychiatric Specialty Exam: Physical Exam  ROS  There were no vitals taken for this visit.There is no height or weight on file to calculate BMI.  General Appearance: NA  Eye Contact:  NA  Speech:  Clear and Coherent  Volume:  Normal  Mood:  Euthymic  Affect:  NA  Thought Process:  Goal Directed  Orientation:  Full (Time, Place, and Person)  Thought Content:  WDL and Logical  Suicidal Thoughts:  No  Homicidal Thoughts:  No  Memory:  Immediate;   Good Recent;   Good Remote;   Good  Judgement:  Good  Insight:  Good  Psychomotor Activity:  NA  Concentration:  Concentration: Good and Attention Span: Good  Recall:  Good  Fund of Knowledge:  Good  Language:  Good  Akathisia:  No  Handed:  Right  AIMS (if indicated):     Assets:  Communication Skills Desire for Improvement Housing Resilience Social Support  ADL's:  Intact  Cognition:  WNL  Sleep:   ok      Assessment and Plan: Major depressive disorder, recurrent.  Anxiety.  Discuss his episodic anxiety and encouraged to take the lorazepam when he had spells of anxiety.  It does help him a lot.  We discussed medication side effects especially benzodiazepine dependence and tolerance.  Patient is aware and he does not want to take Ativan every day.  Continue Lexapro 30 mg daily.  Patient is not interested in therapy.  Recommended to call us back if he has any question or any concern.  Encourage to spend more time in his hobbies which actually helps him a lot.  Follow-up in 3 months.  Follow Up Instructions:    I discussed the assessment and treatment plan with the patient. The patient was provided an opportunity to ask questions and all were answered. The patient agreed with the plan and demonstrated an understanding of the  instructions.   The patient was advised to call back or seek an in-person evaluation if the symptoms worsen or if the condition fails to improve as anticipated.  I provided 20 minutes of non-face-to-face time during this encounter.   Kathlee Nations, MD

## 2019-09-19 ENCOUNTER — Ambulatory Visit (HOSPITAL_COMMUNITY): Payer: Medicare Other | Admitting: Psychiatry

## 2019-09-25 ENCOUNTER — Other Ambulatory Visit: Payer: Self-pay

## 2019-09-25 ENCOUNTER — Inpatient Hospital Stay: Payer: Medicare Other | Attending: Hematology and Oncology

## 2019-09-25 DIAGNOSIS — D509 Iron deficiency anemia, unspecified: Secondary | ICD-10-CM | POA: Diagnosis not present

## 2019-09-25 LAB — CBC WITH DIFFERENTIAL (CANCER CENTER ONLY)
Abs Immature Granulocytes: 0.02 10*3/uL (ref 0.00–0.07)
Basophils Absolute: 0 10*3/uL (ref 0.0–0.1)
Basophils Relative: 1 %
Eosinophils Absolute: 0.2 10*3/uL (ref 0.0–0.5)
Eosinophils Relative: 2 %
HCT: 38.7 % — ABNORMAL LOW (ref 39.0–52.0)
Hemoglobin: 12.4 g/dL — ABNORMAL LOW (ref 13.0–17.0)
Immature Granulocytes: 0 %
Lymphocytes Relative: 25 %
Lymphs Abs: 1.5 10*3/uL (ref 0.7–4.0)
MCH: 26.2 pg (ref 26.0–34.0)
MCHC: 32 g/dL (ref 30.0–36.0)
MCV: 81.8 fL (ref 80.0–100.0)
Monocytes Absolute: 0.8 10*3/uL (ref 0.1–1.0)
Monocytes Relative: 12 %
Neutro Abs: 3.7 10*3/uL (ref 1.7–7.7)
Neutrophils Relative %: 60 %
Platelet Count: 255 10*3/uL (ref 150–400)
RBC: 4.73 MIL/uL (ref 4.22–5.81)
RDW: 14.2 % (ref 11.5–15.5)
WBC Count: 6.2 10*3/uL (ref 4.0–10.5)
nRBC: 0 % (ref 0.0–0.2)

## 2019-09-25 LAB — IRON AND TIBC
Iron: 75 ug/dL (ref 42–163)
Saturation Ratios: 26 % (ref 20–55)
TIBC: 284 ug/dL (ref 202–409)
UIBC: 209 ug/dL (ref 117–376)

## 2019-09-25 LAB — FERRITIN: Ferritin: 192 ng/mL (ref 24–336)

## 2019-09-26 NOTE — Progress Notes (Signed)
HEMATOLOGY-ONCOLOGY DOXIMITY VISIT PROGRESS NOTE  I connected with Fran Lowes on 09/27/2019 at 11:45 AM EST by Doximity video conference and verified that I am speaking with the correct person using two identifiers.  I discussed the limitations, risks, security and privacy concerns of performing an evaluation and management service by Doximity and the availability of in person appointments.  I also discussed with the patient that there may be a patient responsible charge related to this service. The patient expressed understanding and agreed to proceed.  Patient's Location: Home Physician Location: Clinic  CHIEF COMPLIANT: Follow-up of iron deficiency anemia  INTERVAL HISTORY: [name] is a 83 y.o. male with above-mentioned history of iron deficiency anemia treated with IV iron therapy. Labs on 09/25/19 showed Hg 12.4, HCT 38.7, iron saturation 26%, ferritin 192. He presents over Doximity today for follow-up to review his labs.  He has been playing golf and enjoying himself.  He does not have any complaints or concerns.  Is thinking about moving to [place].   REVIEW OF SYSTEMS:   Constitutional: Denies fevers, chills or abnormal weight loss Eyes: Denies blurriness of vision Ears, nose, mouth, throat, and face: Denies mucositis or sore throat Respiratory: Denies cough, dyspnea or wheezes Cardiovascular: Denies palpitation, chest discomfort Gastrointestinal:  Denies nausea, heartburn or change in bowel habits Skin: Denies abnormal skin rashes Lymphatics: Denies new lymphadenopathy or easy bruising Neurological:Denies numbness, tingling or new weaknesses Behavioral/Psych: Mood is stable, no new changes  Extremities: No lower extremity edema All other systems were reviewed with the patient and are negative.  Observations/Objective:  There were no vitals filed for this visit. There is no height or weight on file to calculate BMI.  I have reviewed the data as listed CMP Latest  Ref Rng & Units 03/26/2019 02/26/2019 02/25/2019  Glucose 70 - 99 mg/dL 137(H) 116(H) 120(H)  BUN 8 - 23 mg/dL 19 11 11   Creatinine 0.61 - 1.24 mg/dL 0.84 0.69 0.85  Sodium 135 - 145 mmol/L 139 136 135  Potassium 3.5 - 5.1 mmol/L 4.4 3.1(L) 2.9(L)  Chloride 98 - 111 mmol/L 106 109 107  CO2 22 - 32 mmol/L 26 23 22   Calcium 8.9 - 10.3 mg/dL 8.6(L) 7.1(L) 7.5(L)  Total Protein 6.5 - 8.1 g/dL 6.7 - -  Total Bilirubin 0.3 - 1.2 mg/dL 0.3 - -  Alkaline Phos 38 - 126 U/L 80 - -  AST 15 - 41 U/L 15 - -  ALT 0 - 44 U/L 25 - -    Lab Results  Component Value Date   WBC 6.2 09/25/2019   HGB 12.4 (L) 09/25/2019   HCT 38.7 (L) 09/25/2019   MCV 81.8 09/25/2019   PLT 255 09/25/2019   NEUTROABS 3.7 09/25/2019      Assessment Plan:  Iron deficiency anemia Patient has had upper endoscopies and colonoscopies and did not have any clear identified source of blood loss. Prior treatment: IV iron therapy: 07/13/2018 2 doses  Lab review 09/25/2019: Ferritin 192, hemoglobin 12.4, MCV 81.8, TIBC 284, iron saturation 26% Based on these lab results, patient does not need any IV iron therapy. I discussed with him that he can be seen by [name] on an as-needed basis. Patient is contemplating on moving to [place] to be with her son.  We are happy to assist in the future if necessary.  I discussed the assessment and treatment plan with the patient. The patient was provided an opportunity to ask questions and all were answered. The patient agreed  with the plan and demonstrated an understanding of the instructions. The patient was advised to call back or seek an in-person evaluation if the symptoms worsen or if the condition fails to improve as anticipated.   I provided 11 minutes of face-to-face Doximity time during this encounter.    Rulon Eisenmenger, MD 09/27/2019   I, Molly Dorshimer, am acting as scribe for Nicholas Lose, MD.  I have reviewed the above documentation for accuracy and completeness, and I agree  with the above.

## 2019-09-27 ENCOUNTER — Inpatient Hospital Stay (HOSPITAL_BASED_OUTPATIENT_CLINIC_OR_DEPARTMENT_OTHER): Payer: Medicare Other | Admitting: Hematology and Oncology

## 2019-09-27 DIAGNOSIS — D509 Iron deficiency anemia, unspecified: Secondary | ICD-10-CM

## 2019-09-27 NOTE — Assessment & Plan Note (Signed)
Patient has had upper endoscopies and colonoscopies and did not have any clear identified source of blood loss. Prior treatment: IV iron therapy: 07/13/2018 2 doses  Lab review 09/25/2019: Ferritin 192, hemoglobin 12.4, MCV 81.8, TIBC 284, iron saturation 26% Based on these lab results, patient does not need any IV iron therapy. Return to clinic in 1 year with labs done ahead of time and follow-up.

## 2019-11-01 ENCOUNTER — Encounter: Payer: Self-pay | Admitting: Family Medicine

## 2019-11-07 ENCOUNTER — Other Ambulatory Visit: Payer: Self-pay | Admitting: Family Medicine

## 2019-11-19 DIAGNOSIS — H2513 Age-related nuclear cataract, bilateral: Secondary | ICD-10-CM | POA: Diagnosis not present

## 2019-11-19 DIAGNOSIS — H5010 Unspecified exotropia: Secondary | ICD-10-CM | POA: Diagnosis not present

## 2019-11-19 DIAGNOSIS — H35372 Puckering of macula, left eye: Secondary | ICD-10-CM | POA: Diagnosis not present

## 2019-11-19 DIAGNOSIS — H43813 Vitreous degeneration, bilateral: Secondary | ICD-10-CM | POA: Diagnosis not present

## 2019-11-20 ENCOUNTER — Ambulatory Visit: Payer: Medicare Other | Attending: Internal Medicine

## 2019-11-20 DIAGNOSIS — Z23 Encounter for immunization: Secondary | ICD-10-CM | POA: Insufficient documentation

## 2019-11-20 NOTE — Progress Notes (Signed)
   Covid-19 Vaccination Clinic  Name:  [name]    MRN: [id number] DOB: 05-28-1936  11/20/2019  Mr. [name] was observed post Covid-19 immunization for 15 minutes without incidence. He was provided with Vaccine Information Sheet and instruction to access the V-Safe system.   Mr. [name] was instructed to call 911 with any severe reactions post vaccine: Marland Kitchen Difficulty breathing  . Swelling of your face and throat  . A fast heartbeat  . A bad rash all over your body  . Dizziness and weakness    Immunizations Administered    Name Date Dose VIS Date Route   Pfizer COVID-19 Vaccine 11/20/2019  9:29 AM 0.3 mL 10/11/2019 Intramuscular   Manufacturer: [place]   Lot: [id number]   [place]: [id number]

## 2019-11-20 NOTE — Progress Notes (Signed)
   Covid-19 Vaccination Clinic  Name:  [name]    MRN: [id number] DOB: 16-Apr-1936  11/20/2019  Mr. [name] was observed post Covid-19 immunization for 15 minutes without incidence. He was provided with Vaccine Information Sheet and instruction to access the V-Safe system.   Mr. [name] was instructed to call 911 with any severe reactions post vaccine: Marland Kitchen Difficulty breathing  . Swelling of your face and throat  . A fast heartbeat  . A bad rash all over your body  . Dizziness and weakness    Immunizations Administered    Name Date Dose VIS Date Route   Pfizer COVID-19 Vaccine 11/20/2019  9:29 AM 0.3 mL 10/11/2019 Intramuscular   Manufacturer: [place]   Lot: [id number]   [place]: [id number]

## 2019-11-25 ENCOUNTER — Ambulatory Visit: Payer: Medicare Other

## 2019-12-03 ENCOUNTER — Other Ambulatory Visit: Payer: Self-pay

## 2019-12-04 ENCOUNTER — Encounter: Payer: Self-pay | Admitting: Family Medicine

## 2019-12-04 ENCOUNTER — Ambulatory Visit (INDEPENDENT_AMBULATORY_CARE_PROVIDER_SITE_OTHER): Payer: Medicare Other | Admitting: Family Medicine

## 2019-12-04 VITALS — BP 142/78 | HR 69 | Temp 98.0°F | Ht 67.0 in | Wt 143.7 lb

## 2019-12-04 DIAGNOSIS — R7303 Prediabetes: Secondary | ICD-10-CM

## 2019-12-04 DIAGNOSIS — Z0000 Encounter for general adult medical examination without abnormal findings: Secondary | ICD-10-CM

## 2019-12-04 NOTE — Progress Notes (Signed)
Subjective:     Patient ID: [name], male   DOB: 08-01-1936, 84 y.o.   MRN: [id number]  HPI   [name] is seen for physical exam.  He has history of hypertension, GERD, BPH, hyperlipidemia, prediabetes, normocytic anemia, B12 deficiency, recurrent depression anxiety followed by psychiatry.  He continues to have some intermittent episodes of anxiety and takes as needed lorazepam and is on Lexapro.  His blood pressure is treated with amlodipine.  He states he has not taken his dosage yet today and that may be why his blood pressure is up slightly today.  He takes finasteride for BPH.  Those symptoms are stable.  Overall doing fairly well.  Has stayed very isolated during pandemic.  Does have B12 deficiency and is on over-the-counter tablet replacement.  Last B12 levels were normal.  He denies any polyuria or polydipsia.  Flu vaccine already given.  He has had first Covid vaccine and is waiting to get second one next week.  Other immunizations up-to-date  Past Medical History:  Diagnosis Date  . Anxiety   . BPH (benign prostatic hypertrophy)   . DDD (degenerative disc disease), lumbar 2002  . Depression   . Difficult intubation 02/22/2019   Anterior with limited oral opening  . Essential hypertension 03/10/2019  . GERD (gastroesophageal reflux disease)   . History of colon polyps   . History of hiatal hernia 2012  . Hyperlipemia    Mild  . Indwelling Foley catheter present   . Iron deficiency anemia    IV iron therapy   . Mild hyperlipidemia 02/20/2008   Qualifier: Diagnosis of  By: Leanne Chang MD, Abelino Tippin    . Pre-diabetes   . Renal cyst 2008   7.6 cm lower pole right renal cyst  . Sleep apnea    slight, no CPAP used   Past Surgical History:  Procedure Laterality Date  . COLONOSCOPY  08/2011  . NASAL SEPTOPLASTY W/ TURBINOPLASTY  08/03/2012   Procedure: NASAL SEPTOPLASTY WITH TURBINATE REDUCTION;  Surgeon: Izora Gala, MD;  Location: [facility];  Service: ENT;  Laterality: Bilateral;   . UPPER GASTROINTESTINAL ENDOSCOPY  08/2011  . XI ROBOTIC ASSISTED SIMPLE PROSTATECTOMY N/A 02/22/2019   Procedure: XI ROBOTIC ASSISTED SIMPLE PROSTATECTOMY;  Surgeon: Alexis Frock, MD;  Location: [facility];  Service: Urology;  Laterality: N/A;    reports that he has quit smoking. He has never used smokeless tobacco. He reports current alcohol use of about 1.0 standard drinks of alcohol per week. He reports that he does not use drugs. family history includes Heart attack in his father; Hypertension in his brother, brother, daughter, father, and sister. No Known Allergies   Review of Systems  Constitutional: Negative for activity change, appetite change, fatigue and fever.  HENT: Negative for congestion, ear pain and trouble swallowing.   Eyes: Negative for pain and visual disturbance.  Respiratory: Negative for cough, shortness of breath and wheezing.   Cardiovascular: Negative for chest pain and palpitations.  Gastrointestinal: Negative for abdominal distention, abdominal pain, blood in stool, constipation, diarrhea, nausea, rectal pain and vomiting.  Endocrine: Negative for polydipsia and polyuria.  Genitourinary: Negative for dysuria, hematuria and testicular pain.  Musculoskeletal: Negative for arthralgias and joint swelling.  Skin: Negative for rash.  Neurological: Negative for dizziness, syncope and headaches.  Hematological: Negative for adenopathy.  Psychiatric/Behavioral: Negative for confusion and dysphoric mood.       Objective:   Physical Exam Constitutional:      General: He is not in  acute distress.    Appearance: He is well-developed.  HENT:     Head: Normocephalic and atraumatic.     Right Ear: External ear normal.     Left Ear: External ear normal.  Eyes:     Conjunctiva/sclera: Conjunctivae normal.     Pupils: Pupils are equal, round, and reactive to light.  Neck:     Thyroid: No thyromegaly.  Cardiovascular:     Rate and Rhythm: Normal rate and regular  rhythm.     Heart sounds: Normal heart sounds. No murmur.  Pulmonary:     Effort: No respiratory distress.     Breath sounds: No wheezing or rales.  Abdominal:     General: Bowel sounds are normal. There is no distension.     Palpations: Abdomen is soft. There is no mass.     Tenderness: There is no abdominal tenderness. There is no guarding or rebound.  Musculoskeletal:     Cervical back: Normal range of motion and neck supple.     Right lower leg: No edema.     Left lower leg: No edema.  Lymphadenopathy:     Cervical: No cervical adenopathy.  Skin:    Findings: No rash.  Neurological:     Mental Status: He is alert and oriented to person, place, and time.     Cranial Nerves: No cranial nerve deficit.     Deep Tendon Reflexes: Reflexes normal.        Assessment:     Physical exam.  He has stable chronic problems as above.  Systolic was slightly up today but he has not taken his medication yet.  We discussed the following health maintenance issues  Plan:     -Continue with annual flu vaccine -Covid vaccination in process -Monitor blood pressure and be in touch of consistent systolic readings over [id number] -Ordered future fasting labs and will include A1c and he will return next Tuesday for that  [name] MD [facility] at [facility]

## 2019-12-04 NOTE — Patient Instructions (Signed)

## 2019-12-08 ENCOUNTER — Ambulatory Visit: Payer: Medicare Other | Attending: Internal Medicine

## 2019-12-08 DIAGNOSIS — Z23 Encounter for immunization: Secondary | ICD-10-CM

## 2019-12-08 NOTE — Progress Notes (Signed)
   Covid-19 Vaccination Clinic  Name:  [name]    MRN: [id number] DOB: 24-Mar-1936  12/08/2019  [name] was observed post Covid-19 immunization for 15 minutes without incidence. He was provided with Vaccine Information Sheet and instruction to access the V-Safe system.   [name] was instructed to call 911 with any severe reactions post vaccine: Marland Kitchen Difficulty breathing  . Swelling of your face and throat  . A fast heartbeat  . A bad rash all over your body  . Dizziness and weakness    Immunizations Administered    Name Date Dose VIS Date Route   Pfizer COVID-19 Vaccine 12/08/2019  1:30 PM 0.3 mL 10/11/2019 Intramuscular   Manufacturer: [place]   Lot: [id number]   [place]: [id number]

## 2019-12-10 ENCOUNTER — Other Ambulatory Visit: Payer: Medicare Other

## 2019-12-11 ENCOUNTER — Other Ambulatory Visit: Payer: Self-pay

## 2019-12-12 ENCOUNTER — Other Ambulatory Visit (INDEPENDENT_AMBULATORY_CARE_PROVIDER_SITE_OTHER): Payer: Medicare Other

## 2019-12-12 DIAGNOSIS — Z0000 Encounter for general adult medical examination without abnormal findings: Secondary | ICD-10-CM | POA: Diagnosis not present

## 2019-12-12 LAB — HEPATIC FUNCTION PANEL
ALT: 15 U/L (ref 0–53)
AST: 13 U/L (ref 0–37)
Albumin: 3.9 g/dL (ref 3.5–5.2)
Alkaline Phosphatase: 50 U/L (ref 39–117)
Bilirubin, Direct: 0.1 mg/dL (ref 0.0–0.3)
Total Bilirubin: 0.4 mg/dL (ref 0.2–1.2)
Total Protein: 6.6 g/dL (ref 6.0–8.3)

## 2019-12-12 LAB — LIPID PANEL
Cholesterol: 186 mg/dL (ref 0–200)
HDL: 58.2 mg/dL (ref >39.00)
LDL Cholesterol: 115 mg/dL — ABNORMAL HIGH (ref 0–99)
NonHDL: 127.75
Total CHOL/HDL Ratio: 3
Triglycerides: 65 mg/dL (ref 0.0–149.0)
VLDL: 13 mg/dL (ref 0.0–40.0)

## 2019-12-12 LAB — BASIC METABOLIC PANEL
BUN: 18 mg/dL (ref 6–23)
CO2: 28 mEq/L (ref 19–32)
Calcium: 8.9 mg/dL (ref 8.4–10.5)
Chloride: 103 mEq/L (ref 96–112)
Creatinine, Ser: 0.87 mg/dL (ref 0.40–1.50)
GFR: 84.14 mL/min (ref >60.00)
Glucose, Bld: 104 mg/dL — ABNORMAL HIGH (ref 70–99)
Potassium: 4.5 mEq/L (ref 3.5–5.1)
Sodium: 138 mEq/L (ref 135–145)

## 2019-12-12 LAB — CBC WITH DIFFERENTIAL/PLATELET
Basophils Absolute: 0 10*3/uL (ref 0.0–0.1)
Basophils Relative: 0.5 % (ref 0.0–3.0)
Eosinophils Absolute: 0.1 10*3/uL (ref 0.0–0.7)
Eosinophils Relative: 2.2 % (ref 0.0–5.0)
HCT: 40.9 % (ref 39.0–52.0)
Hemoglobin: 13.1 g/dL (ref 13.0–17.0)
Lymphocytes Relative: 31 % (ref 12.0–46.0)
Lymphs Abs: 1.7 10*3/uL (ref 0.7–4.0)
MCHC: 31.9 g/dL (ref 30.0–36.0)
MCV: 80.5 fl (ref 78.0–100.0)
Monocytes Absolute: 0.6 10*3/uL (ref 0.1–1.0)
Monocytes Relative: 11.4 % (ref 3.0–12.0)
Neutro Abs: 3 10*3/uL (ref 1.4–7.7)
Neutrophils Relative %: 54.9 % (ref 43.0–77.0)
Platelets: 197 10*3/uL (ref 150.0–400.0)
RBC: 5.08 Mil/uL (ref 4.22–5.81)
RDW: 13.9 % (ref 11.5–15.5)
WBC: 5.5 10*3/uL (ref 4.0–10.5)

## 2019-12-12 LAB — TSH: TSH: 3.28 u[IU]/mL (ref 0.35–4.50)

## 2019-12-12 LAB — HEMOGLOBIN A1C: Hgb A1c MFr Bld: 6.1 % (ref 4.6–6.5)

## 2019-12-16 ENCOUNTER — Other Ambulatory Visit: Payer: Self-pay

## 2019-12-16 ENCOUNTER — Ambulatory Visit (INDEPENDENT_AMBULATORY_CARE_PROVIDER_SITE_OTHER): Payer: Medicare Other | Admitting: Psychiatry

## 2019-12-16 ENCOUNTER — Encounter (HOSPITAL_COMMUNITY): Payer: Self-pay | Admitting: Psychiatry

## 2019-12-16 DIAGNOSIS — F419 Anxiety disorder, unspecified: Secondary | ICD-10-CM | POA: Diagnosis not present

## 2019-12-16 DIAGNOSIS — F332 Major depressive disorder, recurrent severe without psychotic features: Secondary | ICD-10-CM | POA: Diagnosis not present

## 2019-12-16 MED ORDER — ESCITALOPRAM OXALATE 20 MG PO TABS: 30.0000 mg | ORAL_TABLET | Freq: Every day | ORAL | 2 refills | Status: DC

## 2019-12-16 MED ORDER — LORAZEPAM 0.5 MG PO TABS: 0.5000 mg | ORAL_TABLET | Freq: Every day | ORAL | 0 refills | Status: DC | PRN

## 2019-12-16 NOTE — Progress Notes (Signed)
Virtual Visit via Telephone Note  I connected with [name] on 12/16/19 at  2:40 PM EST by telephone and verified that I am speaking with the correct person using two identifiers.   I discussed the limitations, risks, security and privacy concerns of performing an evaluation and management service by telephone and the availability of in person appointments. I also discussed with the patient that there may be a patient responsible charge related to this service. The patient expressed understanding and agreed to proceed.   History of Present Illness: Patient was evaluated by phone session.  He admitted cycle of anxiety started again and he is taking lorazepam which is helping but does not want to take lorazepam for a long time.  He is sleeping good.  He recently had blood work and his hemoglobin is improved from the past.  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  He denies any crying spells or any feeling of hopelessness or worthlessness.  He lives with his wife who is supportive.  Energy level is good.  His appetite is good and he is pleased that he able to gain some weight.  He is not interested in therapy.   Past Psychiatric History:Reviewed. H/Odepression and anxiety most of his life. Had a good response with TMS andLexapro. Tried Celexa, Pristiq and Cymbalta but limited outcome. PCP tried Klonopin but made him groggy.Noh/osuicidal attempt, mania, psychosis or self abusive behavior. Saw Dr. Cheryln Manly in the past for therapy.  Recent Results (from the past 2160 hour(s))  CBC with Differential ([place] Only)     Status: Abnormal   Collection Time: 09/25/19 11:02 AM  Result Value Ref Range   WBC Count 6.2 4.0 - 10.5 K/uL   RBC 4.73 4.22 - 5.81 MIL/uL   Hemoglobin 12.4 (L) 13.0 - 17.0 g/dL   HCT 38.7 (L) 39.0 - 52.0 %   MCV 81.8 80.0 - 100.0 fL   MCH 26.2 26.0 - 34.0 pg   MCHC 32.0 30.0 - 36.0 g/dL   RDW 14.2 11.5 -  15.5 %   Platelet Count 255 150 - 400 K/uL   nRBC 0.0 0.0 - 0.2 %   Neutrophils Relative % 60 %   Neutro Abs 3.7 1.7 - 7.7 K/uL   Lymphocytes Relative 25 %   Lymphs Abs 1.5 0.7 - 4.0 K/uL   Monocytes Relative 12 %   Monocytes Absolute 0.8 0.1 - 1.0 K/uL   Eosinophils Relative 2 %   Eosinophils Absolute 0.2 0.0 - 0.5 K/uL   Basophils Relative 1 %   Basophils Absolute 0.0 0.0 - 0.1 K/uL   Immature Granulocytes 0 %   Abs Immature Granulocytes 0.02 0.00 - 0.07 K/uL    Comment: Performed at [facility], [address] [place], [place] [address]  Iron and TIBC     Status: None   Collection Time: 09/25/19 11:02 AM  Result Value Ref Range   Iron 75 42 - 163 ug/dL   TIBC 284 202 - 409 ug/dL   Saturation Ratios 26 20 - 55 %   UIBC 209 117 - 376 ug/dL    Comment: Performed at [facility], [place] [address], [place], [place] [address]  Ferritin     Status: None   Collection Time: 09/25/19 11:02 AM  Result Value Ref Range   Ferritin 192 24 - 336 ng/mL    Comment: Performed at [facility], [place] [address], [place], [place] [address]  Hemoglobin A1c     Status: None   Collection Time: 12/12/19  9:33 AM  Result Value Ref Range   Hgb A1c MFr Bld 6.1 4.6 - 6.5 %    Comment: Glycemic Control Guidelines for People with Diabetes:Non Diabetic:  <6%Goal of Therapy: <7%Additional Action Suggested:  >8%   Hepatic function panel     Status: None   Collection Time: 12/12/19  9:33 AM  Result Value Ref Range   Total Bilirubin 0.4 0.2 - 1.2 mg/dL   Bilirubin, Direct 0.1 0.0 - 0.3 mg/dL   Alkaline Phosphatase 50 39 - 117 U/L   AST 13 0 - 37 U/L   ALT 15 0 - 53 U/L   Total Protein 6.6 6.0 - 8.3 g/dL   Albumin 3.9 3.5 - 5.2 g/dL  TSH     Status: None   Collection Time: 12/12/19  9:33 AM  Result Value Ref Range   TSH 3.28 0.35 - 4.50 uIU/mL  CBC with Differential/Platelet     Status: None   Collection Time: 12/12/19  9:33 AM   Result Value Ref Range   WBC 5.5 4.0 - 10.5 K/uL   RBC 5.08 4.22 - 5.81 Mil/uL   Hemoglobin 13.1 13.0 - 17.0 g/dL   HCT 40.9 39.0 - 52.0 %   MCV 80.5 78.0 - 100.0 fl   MCHC 31.9 30.0 - 36.0 g/dL   RDW 13.9 11.5 - 15.5 %   Platelets 197.0 150.0 - 400.0 K/uL   Neutrophils Relative % 54.9 43.0 - 77.0 %   Lymphocytes Relative 31.0 12.0 - 46.0 %   Monocytes Relative 11.4 3.0 - 12.0 %   Eosinophils Relative 2.2 0.0 - 5.0 %   Basophils Relative 0.5 0.0 - 3.0 %   Neutro Abs 3.0 1.4 - 7.7 K/uL   Lymphs Abs 1.7 0.7 - 4.0 K/uL   Monocytes Absolute 0.6 0.1 - 1.0 K/uL   Eosinophils Absolute 0.1 0.0 - 0.7 K/uL   Basophils Absolute 0.0 0.0 - 0.1 K/uL  Lipid panel     Status: Abnormal   Collection Time: 12/12/19  9:33 AM  Result Value Ref Range   Cholesterol 186 0 - 200 mg/dL    Comment: ATP III Classification       Desirable:  < 200 mg/dL               Borderline High:  200 - 239 mg/dL          High:  > = 240 mg/dL   Triglycerides 65.0 0.0 - 149.0 mg/dL    Comment: Normal:  <150 mg/dLBorderline High:  150 - 199 mg/dL   HDL 58.20 >39.00 mg/dL   VLDL 13.0 0.0 - 40.0 mg/dL   LDL Cholesterol 115 (H) 0 - 99 mg/dL   Total CHOL/HDL Ratio 3     Comment:                Men          Women1/2 Average Risk     3.4          3.3Average Risk          5.0          4.42X Average Risk          9.6          7.13X Average Risk          15.0  11.0                       NonHDL 127.75     Comment: NOTE:  Non-HDL goal should be 30 mg/dL higher than patient's LDL goal (i.e. LDL goal of < 70 mg/dL, would have non-HDL goal of < 100 mg/dL)  Basic metabolic panel     Status: Abnormal   Collection Time: 12/12/19  9:33 AM  Result Value Ref Range   Sodium 138 135 - 145 mEq/L   Potassium 4.5 3.5 - 5.1 mEq/L   Chloride 103 96 - 112 mEq/L   CO2 28 19 - 32 mEq/L   Glucose, Bld 104 (H) 70 - 99 mg/dL   BUN 18 6 - 23 mg/dL   Creatinine, Ser 0.87 0.40 - 1.50 mg/dL   GFR 84.14 >60.00 mL/min   Calcium 8.9 8.4 - 10.5  mg/dL      Psychiatric Specialty Exam: Physical Exam  Review of Systems  There were no vitals taken for this visit.There is no height or weight on file to calculate BMI.  General Appearance: NA  Eye Contact:  NA  Speech:  Clear and Coherent and Normal Rate  Volume:  Normal  Mood:  Euthymic  Affect:  NA  Thought Process:  Goal Directed  Orientation:  Full (Time, Place, and Person)  Thought Content:  WDL  Suicidal Thoughts:  No  Homicidal Thoughts:  No  Memory:  Immediate;   Good Recent;   Good Remote;   Good  Judgement:  Good  Insight:  Good  Psychomotor Activity:  NA  Concentration:  Concentration: Good and Attention Span: Good  Recall:  Good  Fund of Knowledge:  Good  Language:  Good  Akathisia:  No  Handed:  Right  AIMS (if indicated):     Assets:  Communication Skills Desire for Improvement Housing Resilience Social Support  ADL's:  Intact  Cognition:  WNL  Sleep:   ok      Assessment and Plan: Major depressive disorder, recurrent.  Anxiety.  I reviewed his blood work results.  His hemoglobin is improved from the past.  We discussed taking the lorazepam as needed for anxiety to avoid dependency.  He had done in the past that after feeling good he had stopped the lorazepam until few months.  I encouraged to continue current medication however if he feels the current medicine not helping and he is feeling more sad and depressed then we can consider maintenance [place].  He agreed with the plan.  Recommended to call us back if is any question or any concern.  Follow-up in 3 months.  Continue Lexapro 30 mg daily and lorazepam 0.5 mg as needed for anxiety.  Patient is not interested in therapy.  Follow Up Instructions:    I discussed the assessment and treatment plan with the patient. The patient was provided an opportunity to ask questions and all were answered. The patient agreed with the plan and demonstrated an understanding of the instructions.   The patient was  advised to call back or seek an in-person evaluation if the symptoms worsen or if the condition fails to improve as anticipated.  I provided 20 minutes of non-face-to-face time during this encounter.   Kathlee Nations, MD

## 2020-01-27 ENCOUNTER — Other Ambulatory Visit: Payer: Self-pay | Admitting: Family Medicine

## 2020-03-06 ENCOUNTER — Other Ambulatory Visit (HOSPITAL_COMMUNITY): Payer: Self-pay | Admitting: *Deleted

## 2020-03-06 DIAGNOSIS — F332 Major depressive disorder, recurrent severe without psychotic features: Secondary | ICD-10-CM

## 2020-03-06 MED ORDER — ESCITALOPRAM OXALATE 20 MG PO TABS: 30.0000 mg | ORAL_TABLET | Freq: Every day | ORAL | 2 refills | Status: DC

## 2020-03-11 ENCOUNTER — Other Ambulatory Visit: Payer: Self-pay

## 2020-03-11 ENCOUNTER — Encounter (HOSPITAL_COMMUNITY): Payer: Self-pay | Admitting: Psychiatry

## 2020-03-11 ENCOUNTER — Ambulatory Visit (INDEPENDENT_AMBULATORY_CARE_PROVIDER_SITE_OTHER): Payer: Medicare Other | Admitting: Psychiatry

## 2020-03-11 DIAGNOSIS — F419 Anxiety disorder, unspecified: Secondary | ICD-10-CM

## 2020-03-11 DIAGNOSIS — F332 Major depressive disorder, recurrent severe without psychotic features: Secondary | ICD-10-CM | POA: Diagnosis not present

## 2020-03-11 MED ORDER — ESCITALOPRAM OXALATE 20 MG PO TABS: 30.0000 mg | ORAL_TABLET | Freq: Every day | ORAL | 2 refills | Status: DC

## 2020-03-11 MED ORDER — LORAZEPAM 0.5 MG PO TABS: 0.5000 mg | ORAL_TABLET | Freq: Every day | ORAL | 0 refills | Status: DC | PRN

## 2020-03-11 NOTE — Progress Notes (Signed)
Virtual Visit via Telephone Note  I connected with [name] on 03/11/20 at  2:40 PM EDT by telephone and verified that I am speaking with the correct person using two identifiers.   I discussed the limitations, risks, security and privacy concerns of performing an evaluation and management service by telephone and the availability of in person appointments. I also discussed with the patient that there may be a patient responsible charge related to this service. The patient expressed understanding and agreed to proceed.   History of Present Illness: Patient is evaluated by phone session.  He reported that he was doing fine until recently started to have again anxiety and nervousness.  He also mentioned that his wife has a knee replacement and he is taking care of for his wife and sometimes gets overwhelmed.  He started taking lorazepam which helps and now he is feeling better.  He is sleeping good.  He denies any crying spells or any feeling of hopelessness.  His son also came to help him from [place].  His appetite is okay.  Though he wanted to gain more weight but he only gained a few pounds from the last visit.  He is not interested in therapy.  He has no tremors shakes or any EPS.   Past Psychiatric History:Reviewed. H/Odepression and anxiety most of his life. Had a good response with TMS andLexapro. Tried Celexa, Pristiq and Cymbalta but limited outcome. PCP tried Klonopin but made him groggy.Noh/osuicidal attempt, mania, psychosis or self abusive behavior. Saw Dr. Cheryln Manly in the past for therapy.    Psychiatric Specialty Exam: Physical Exam  Review of Systems  There were no vitals taken for this visit.There is no height or weight on file to calculate BMI.  General Appearance: NA  Eye Contact:  NA  Speech:  Clear and Coherent  Volume:  Normal  Mood:  Euthymic  Affect:  NA  Thought Process:  Goal Directed  Orientation:  Full (Time, Place, and Person)  Thought Content:   WDL  Suicidal Thoughts:  No  Homicidal Thoughts:  No  Memory:  Immediate;   Good Recent;   Good Remote;   Good  Judgement:  Good  Insight:  Present  Psychomotor Activity:  NA  Concentration:  Concentration: Good and Attention Span: Good  Recall:  Good  Fund of Knowledge:  Good  Language:  Good  Akathisia:  No  Handed:  Right  AIMS (if indicated):     Assets:  Communication Skills Desire for Improvement Housing Resilience Social Support Transportation  ADL's:  Intact  Cognition:  WNL  Sleep:   ok      Assessment and Plan: Major depressive disorder, recurrent.  Anxiety.  Discuss his current psychosocial stressors.  I do agree that he is taking lorazepam as needed to avoid it dependency and it helps his cycle of anxiety.  Patient also comfortable with the current dose and does not want to increase the medication.  Continue Lexapro 30 mg daily and lorazepam 0.5 mg as needed for severe anxiety.  Recommended to call us back if there is any question or any concerns.  Follow-up in 3 months.  Follow Up Instructions:    I discussed the assessment and treatment plan with the patient. The patient was provided an opportunity to ask questions and all were answered. The patient agreed with the plan and demonstrated an understanding of the instructions.   The patient was advised to call back or seek an in-person evaluation if the symptoms  worsen or if the condition fails to improve as anticipated.  I provided 20 minutes of non-face-to-face time during this encounter.   Kathlee Nations, MD

## 2020-03-12 ENCOUNTER — Ambulatory Visit (HOSPITAL_COMMUNITY): Payer: Medicare Other | Admitting: Psychiatry

## 2020-04-07 ENCOUNTER — Other Ambulatory Visit (HOSPITAL_COMMUNITY): Payer: Self-pay | Admitting: *Deleted

## 2020-04-07 DIAGNOSIS — F419 Anxiety disorder, unspecified: Secondary | ICD-10-CM

## 2020-04-07 MED ORDER — LORAZEPAM 0.5 MG PO TABS: 0.5000 mg | ORAL_TABLET | Freq: Every day | ORAL | 0 refills | Status: DC | PRN

## 2020-04-29 ENCOUNTER — Other Ambulatory Visit: Payer: Self-pay

## 2020-04-29 ENCOUNTER — Telehealth: Payer: Self-pay | Admitting: *Deleted

## 2020-04-29 ENCOUNTER — Telehealth (INDEPENDENT_AMBULATORY_CARE_PROVIDER_SITE_OTHER): Payer: Medicare Other | Admitting: Psychiatry

## 2020-04-29 DIAGNOSIS — F419 Anxiety disorder, unspecified: Secondary | ICD-10-CM | POA: Diagnosis not present

## 2020-04-29 DIAGNOSIS — F332 Major depressive disorder, recurrent severe without psychotic features: Secondary | ICD-10-CM | POA: Diagnosis not present

## 2020-04-29 MED ORDER — ESCITALOPRAM OXALATE 20 MG PO TABS: 30.0000 mg | ORAL_TABLET | Freq: Every day | ORAL | 2 refills | Status: DC

## 2020-04-29 MED ORDER — LORAZEPAM 0.5 MG PO TABS: 0.5000 mg | ORAL_TABLET | Freq: Every day | ORAL | 0 refills | Status: DC | PRN

## 2020-04-29 NOTE — Progress Notes (Signed)
Virtual Visit via Telephone Note  I connected with [name] on 04/29/20 at  1:40 PM EDT by telephone and verified that I am speaking with the correct person using two identifiers.  Location: Patient: Home Provider: Home office   I discussed the limitations, risks, security and privacy concerns of performing an evaluation and management service by telephone and the availability of in person appointments. I also discussed with the patient that there may be a patient responsible charge related to this service. The patient expressed understanding and agreed to proceed.   History of Present Illness: Patient is evaluated by phone session.  He requested earlier appointment because he tried to come off from lorazepam but every day he felt nervous and anxious.  He is not sure how to cut down his Ativan.  When I ask PT if he is trying to stop cold [place], he replied yes.  I explained that he should not stop cold [place] and has benzodiazepine needs to be titrated slowly to wean him off.  I recommend to take lorazepam every other day and then gradually increase the interval time.  Otherwise he has been doing good.  His appetite is okay.  He is happy that his wife fully recovered and doing much better.  He admitted sometimes stressed about his children but no new concerns.  Ready level is good.  He has not started writing quickly but he does have regular sitting and session with his friend and recite poetry.  So far he has no tremors shakes EPS or any concern.  Denies any hallucination, paranoia, suicidal thoughts.  He is compliant with Lexapro.   Past Psychiatric History:Reviewed. H/Odepression and anxiety most of his life. Had a good response with TMS andLexapro. Tried Celexa, Pristiq and Cymbalta but limited outcome. PCP tried Klonopin but made him groggy.Noh/osuicidal attempt, mania, psychosis or self abusive behavior. Saw Dr. Cheryln Manly in the past for therapy.   Psychiatric Specialty  Exam: Physical Exam  Review of Systems  There were no vitals taken for this visit.There is no height or weight on file to calculate BMI.  General Appearance: NA  Eye Contact:  NA  Speech:  Clear and Coherent  Volume:  Normal  Mood:  Euthymic  Affect:  NA  Thought Process:  Goal Directed  Orientation:  Full (Time, Place, and Person)  Thought Content:  Rumination  Suicidal Thoughts:  No  Homicidal Thoughts:  No  Memory:  Immediate;   Good Recent;   Good Remote;   Good  Judgement:  Good  Insight:  Present  Psychomotor Activity:  NA  Concentration:  Concentration: Good and Attention Span: Good  Recall:  Good  Fund of Knowledge:  Good  Language:  Good  Akathisia:  No  Handed:  Right  AIMS (if indicated):     Assets:  Communication Skills Desire for Improvement Housing Resilience Social Support  ADL's:  Intact  Cognition:  WNL  Sleep:   mostly ok      Assessment and Plan: Major depressive disorder, recurrent.  Anxiety.  Discuss is concerned about stopping lorazepam.  Recommended to slowly wean him off from lorazepam and gradually increase the interval.  However if he is still need something to help with anxiety and he preferred not to take benzo we consider starting BuSpar.  I have discussed this idea with the patient and he agreed but he like to try to wean him off from lorazepam first.  He will continue Lexapro 30 mg daily.  I  recommend to call us back if is any question or any concern.  We will schedule appointment in 3 months.  Follow Up Instructions:    I discussed the assessment and treatment plan with the patient. The patient was provided an opportunity to ask questions and all were answered. The patient agreed with the plan and demonstrated an understanding of the instructions.   The patient was advised to call back or seek an in-person evaluation if the symptoms worsen or if the condition fails to improve as anticipated.  I provided 15 minutes of  non-face-to-face time during this encounter.   Kathlee Nations, MD

## 2020-04-29 NOTE — Telephone Encounter (Signed)
Nidhi called after hours line. Per Nidhi she is calling with [facility] and has seen one of Dr. Erick Blinks patients and needs to report abnormal findings on this patient as well as a urine check. Results from the PAD screening that showed moderate reading on the left leg. Urine dip showed protein +2

## 2020-04-29 NOTE — Telephone Encounter (Signed)
fyi

## 2020-05-14 DIAGNOSIS — M25531 Pain in right wrist: Secondary | ICD-10-CM | POA: Diagnosis not present

## 2020-05-14 DIAGNOSIS — M13831 Other specified arthritis, right wrist: Secondary | ICD-10-CM | POA: Diagnosis not present

## 2020-06-11 ENCOUNTER — Telehealth (HOSPITAL_COMMUNITY): Payer: Medicare Other | Admitting: Psychiatry

## 2020-07-08 ENCOUNTER — Telehealth: Payer: Self-pay | Admitting: Family Medicine

## 2020-07-08 DIAGNOSIS — R8271 Bacteriuria: Secondary | ICD-10-CM | POA: Diagnosis not present

## 2020-07-08 NOTE — Progress Notes (Signed)
  Chronic Care Management   Outreach Note  07/08/2020 Name: [name] MRN: [id number] DOB: 07-Nov-1935  Referred by: [name], MD Reason for referral : No chief complaint on file.   An unsuccessful telephone outreach was attempted today. The patient was referred to the pharmacist for assistance with care management and care coordination.   Follow Up Plan:   Carley Perdue [facility] Scheduler

## 2020-07-15 ENCOUNTER — Telehealth: Payer: Self-pay | Admitting: Family Medicine

## 2020-07-15 NOTE — Progress Notes (Signed)
°  Chronic Care Management   Outreach Note  07/15/2020 Name: BETHEL GAGLIO MRN: [id number] DOB: [birth year]  Referred by: [name], MD Reason for referral : No chief complaint on file.   A second unsuccessful telephone outreach was attempted today. The patient was referred to pharmacist for assistance with care management and care coordination.  Follow Up Plan:   Carley Perdue [facility] Scheduler

## 2020-07-16 ENCOUNTER — Other Ambulatory Visit (HOSPITAL_COMMUNITY): Payer: Self-pay | Admitting: *Deleted

## 2020-07-16 ENCOUNTER — Telehealth (HOSPITAL_COMMUNITY): Payer: Self-pay | Admitting: *Deleted

## 2020-07-16 DIAGNOSIS — F419 Anxiety disorder, unspecified: Secondary | ICD-10-CM

## 2020-07-16 MED ORDER — LORAZEPAM 0.5 MG PO TABS: 0.5000 mg | ORAL_TABLET | Freq: Every day | ORAL | 0 refills | Status: DC | PRN

## 2020-07-16 NOTE — Telephone Encounter (Signed)
Send to [employer] at [place].

## 2020-07-16 NOTE — Telephone Encounter (Signed)
Pt called requesting a refill of Lorazepam 0.5mg  last ordered 04/29/20. Pt has an upcoming appointment on 0/29/21. Last appointment in 8/21 cx. Please review and advise.

## 2020-07-23 ENCOUNTER — Other Ambulatory Visit: Payer: Self-pay | Admitting: Family Medicine

## 2020-07-24 ENCOUNTER — Telehealth: Payer: Self-pay | Admitting: Family Medicine

## 2020-07-24 NOTE — Progress Notes (Signed)
  Chronic Care Management   Outreach Note  07/24/2020 Name: [name] MRN: [id number] DOB: 1935-11-30  Referred by: [name], MD Reason for referral : No chief complaint on file.   Third unsuccessful telephone outreach was attempted today. The patient was referred to the pharmacist for assistance with care management and care coordination.   Follow Up Plan:   Carley Perdue [facility] Scheduler

## 2020-07-29 ENCOUNTER — Telehealth (INDEPENDENT_AMBULATORY_CARE_PROVIDER_SITE_OTHER): Payer: Medicare Other | Admitting: Psychiatry

## 2020-07-29 ENCOUNTER — Other Ambulatory Visit: Payer: Self-pay

## 2020-07-29 ENCOUNTER — Encounter (HOSPITAL_COMMUNITY): Payer: Self-pay | Admitting: Psychiatry

## 2020-07-29 DIAGNOSIS — F332 Major depressive disorder, recurrent severe without psychotic features: Secondary | ICD-10-CM | POA: Diagnosis not present

## 2020-07-29 DIAGNOSIS — F419 Anxiety disorder, unspecified: Secondary | ICD-10-CM

## 2020-07-29 MED ORDER — LORAZEPAM 0.5 MG PO TABS: 0.5000 mg | ORAL_TABLET | Freq: Every day | ORAL | 0 refills | Status: DC | PRN

## 2020-07-29 MED ORDER — ESCITALOPRAM OXALATE 20 MG PO TABS: 30.0000 mg | ORAL_TABLET | Freq: Every day | ORAL | 0 refills | Status: DC

## 2020-07-29 MED ORDER — MIRTAZAPINE 15 MG PO TABS: 15.0000 mg | ORAL_TABLET | Freq: Every day | ORAL | 0 refills | Status: DC

## 2020-07-29 NOTE — Progress Notes (Signed)
Virtual Visit via Telephone Note  I connected with Fran Lowes on 07/29/20 at  2:40 PM EDT by telephone and verified that I am speaking with the correct person using two identifiers.  Location: Patient: Home Provider: Home Office   I discussed the limitations, risks, security and privacy concerns of performing an evaluation and management service by telephone and the availability of in person appointments. I also discussed with the patient that there may be a patient responsible charge related to this service. The patient expressed understanding and agreed to proceed.   History of Present Illness: Patient is evaluated by phone session.  He continues to struggle with anxiety and reported having episodes of severe nervousness and anxious which she described feeling overwhelmed and do not have desire to do anything.  I also spoke to his wife shishi who reported that patient especially in the morning not motivated to do things.  He is anxious to talk to his friends.  He does not socialize.  His birthday is coming tomorrow and he does not want to celebrate even though his friends wants to.  Patient endorsed poor sleep and in the morning he feels more anxious.  Lately he has to take 2 Ativan to calm him down.  He denies any hallucination, paranoia, suicidal thoughts.  He is trying to gain weight but he has no appetite and his weight remains unchanged from the past.  When I ask about specific triggers he do not recall and he feels nothing is changed.  He has general anxiety about his health and children but there has been no new concerns.  He has no tremors, shakes or any EPS.  Denies any suicidal thoughts.    Past Psychiatric History:Reviewed. H/Odepression and anxiety most of his life. Had a good response with TMS andLexapro. Tried Celexa, Pristiq and Cymbalta but limited outcome. PCP tried Klonopin but made him groggy.Noh/osuicidal attempt, mania, psychosis or self abusive behavior. Saw Dr.  Cheryln Manly in the past for therapy.   Psychiatric Specialty Exam: Physical Exam  Review of Systems  Weight 137 lb (62.1 kg).There is no height or weight on file to calculate BMI.  General Appearance: NA  Eye Contact:  NA  Speech:  Slow  Volume:  Decreased  Mood:  Anxious and Depressed  Affect:  NA  Thought Process:  Descriptions of Associations: Intact  Orientation:  Full (Time, Place, and Person)  Thought Content:  Rumination  Suicidal Thoughts:  No  Homicidal Thoughts:  No  Memory:  Immediate;   Good Recent;   Good Remote;   Good  Judgement:  Intact  Insight:  Present  Psychomotor Activity:  NA  Concentration:  Concentration: Good and Attention Span: Good  Recall:  Good  Fund of Knowledge:  Good  Language:  Good  Akathisia:  No  Handed:  Right  AIMS (if indicated):     Assets:  Communication Skills Desire for Improvement Housing  ADL's:  Intact  Cognition:  WNL  Sleep:   fair      Assessment and Plan: Major depressive disorder, recurrent.  Anxiety.  Discussed his current medication.  Recommend to add low-dose mirtazapine to help his anxiety, insomnia and may help his increased appetite.  In the meantime he will continue Ativan 0.5 mg which she will take 1 to 2 tablet as needed and continue Lexapro 30 mg daily.  I also talked to his wife about considering [place] since he had a good response.  If patient do not see any improvement  with low-dose mirtazapine we will refer him to [facility].  Patient and his wife agree with the plan.  Recommended to call us back if is any question, concern if he feels worsening of the symptom.  Follow-up in 4 weeks.  Follow Up Instructions:    I discussed the assessment and treatment plan with the patient. The patient was provided an opportunity to ask questions and all were answered. The patient agreed with the plan and demonstrated an understanding of the instructions.   The patient was advised to call back or seek an in-person evaluation if  the symptoms worsen or if the condition fails to improve as anticipated.  I provided 20 minutes of non-face-to-face time during this encounter.   Kathlee Nations, MD

## 2020-08-02 ENCOUNTER — Other Ambulatory Visit (HOSPITAL_COMMUNITY): Payer: Self-pay | Admitting: Psychiatry

## 2020-08-02 DIAGNOSIS — F419 Anxiety disorder, unspecified: Secondary | ICD-10-CM

## 2020-08-02 DIAGNOSIS — F332 Major depressive disorder, recurrent severe without psychotic features: Secondary | ICD-10-CM

## 2020-08-05 ENCOUNTER — Telehealth (HOSPITAL_COMMUNITY): Payer: Self-pay | Admitting: *Deleted

## 2020-08-05 NOTE — Telephone Encounter (Signed)
Patient called and stated he had stopped the Remeron because it was making him sick.  He did not ask for something to replace it but thought you might want to review. Thanks.

## 2020-08-05 NOTE — Telephone Encounter (Signed)
Not sure if he wants a new medicine or not. He can try amitriptyline 10 mg at bed time. If he agree than call pharmacy.

## 2020-08-05 NOTE — Telephone Encounter (Signed)
Spoke with patient. He does not want to take anything right now. Michela Pitcher he will discuss at his next appt.

## 2020-08-24 ENCOUNTER — Other Ambulatory Visit: Payer: Self-pay

## 2020-08-24 ENCOUNTER — Telehealth (INDEPENDENT_AMBULATORY_CARE_PROVIDER_SITE_OTHER): Payer: Medicare Other | Admitting: Psychiatry

## 2020-08-24 DIAGNOSIS — F419 Anxiety disorder, unspecified: Secondary | ICD-10-CM | POA: Diagnosis not present

## 2020-08-24 DIAGNOSIS — F332 Major depressive disorder, recurrent severe without psychotic features: Secondary | ICD-10-CM

## 2020-08-24 MED ORDER — LORAZEPAM 0.5 MG PO TABS: 0.5000 mg | ORAL_TABLET | Freq: Every day | ORAL | 0 refills | Status: DC | PRN

## 2020-08-24 MED ORDER — ESCITALOPRAM OXALATE 20 MG PO TABS: 30.0000 mg | ORAL_TABLET | Freq: Every day | ORAL | 0 refills | Status: DC

## 2020-08-24 NOTE — Progress Notes (Signed)
Virtual Visit via Telephone Note  I connected with Fran Lowes on 08/24/20 at  2:20 PM EDT by telephone and verified that I am speaking with the correct person using two identifiers.  Location: Patient: home Provider: home office   I discussed the limitations, risks, security and privacy concerns of performing an evaluation and management service by telephone and the availability of in person appointments. I also discussed with the patient that there may be a patient responsible charge related to this service. The patient expressed understanding and agreed to proceed.   History of Present Illness: Patient is evaluated by phone session.  On the last session we recommended to try Remeron 15 mg and he took it for 1 week and actually it helped sleep and anxiety but patient noticed next day grogginess.  He called our office and I recommended to try amitriptyline but patient did not show any interest to try a different medication.  Overall he feels things are much better.  His appetite is improved.  He started thinking about writing poetry and also he started going to gym.  He feels life is good in general.  He denies any crying spells, feeling of hopelessness or worthlessness.  He started watching TV sometimes.  He feels that he is coming back to his life slowly and gradually.  He has no tremors, shakes or any EPS.  He does take Ativan every day and he tried to stopped after few days but he feels anxiety come back.  He is taking Lexapro as prescribed.  Denies any suicidal thoughts.  His energy level is improved.  Past Psychiatric History:Reviewed. H/Odepression and anxiety most of his life. Had a good response with TMS andLexapro. Tried Celexa, Pristiq and Cymbalta but limited outcome. PCP tried Klonopin but made him groggy.Noh/osuicidal attempt, mania, psychosis or self abusive behavior. Saw Dr. Cheryln Manly in the past for therapy.   Psychiatric Specialty Exam: Physical Exam  Review of  Systems  There were no vitals taken for this visit.There is no height or weight on file to calculate BMI.  General Appearance: NA  Eye Contact:  NA  Speech:  Clear and Coherent  Volume:  Normal  Mood:  Anxious  Affect:  NA  Thought Process:  Goal Directed  Orientation:  Full (Time, Place, and Person)  Thought Content:  Logical  Suicidal Thoughts:  No  Homicidal Thoughts:  No  Memory:  Immediate;   Good Recent;   Good Remote;   Good  Judgement:  Intact  Insight:  Present  Psychomotor Activity:  NA  Concentration:  Concentration: Good and Attention Span: Good  Recall:  Good  Fund of Knowledge:  Good  Language:  Good  Akathisia:  No  Handed:  Right  AIMS (if indicated):     Assets:  Communication Skills Desire for Improvement Housing Social Support  ADL's:  Intact  Cognition:  WNL  Sleep:   fair     Assessment and Plan: Major depressive disorder, recurrent.  Anxiety.  Discussed that slowly and gradually he is doing better.  I recommend he can try half dose of mirtazapine which is 7.5 mg if needed as it may not cause drowsiness next day.  Patient agreed to give a try.  He does not need a new prescription as he have another mirtazapine which he can cut down in half.  Continue Lexapro 30 mg daily and lorazepam 0.5 mg to take as needed.  Discussed benzodiazepine dependence tolerance and withdrawal.  Patient tried to cut  down and stop the Ativan but his anxiety get worse.  We have discussed for Amite but at this time he does not feel he needed any other new treatment.  I recommend to call us back if is any question or any concern.  He like to follow-up in 6 weeks.  Follow Up Instructions:    I discussed the assessment and treatment plan with the patient. The patient was provided an opportunity to ask questions and all were answered. The patient agreed with the plan and demonstrated an understanding of the instructions.   The patient was advised to call back or seek an in-person  evaluation if the symptoms worsen or if the condition fails to improve as anticipated.  I provided 15 minutes of non-face-to-face time during this encounter.   Kathlee Nations, MD

## 2020-09-22 ENCOUNTER — Other Ambulatory Visit (HOSPITAL_COMMUNITY): Payer: Self-pay | Admitting: Psychiatry

## 2020-09-22 DIAGNOSIS — F332 Major depressive disorder, recurrent severe without psychotic features: Secondary | ICD-10-CM

## 2020-10-02 ENCOUNTER — Ambulatory Visit: Payer: Medicare Other

## 2020-10-13 ENCOUNTER — Encounter (HOSPITAL_COMMUNITY): Payer: Self-pay | Admitting: Psychiatry

## 2020-10-13 ENCOUNTER — Other Ambulatory Visit: Payer: Self-pay

## 2020-10-13 ENCOUNTER — Telehealth (INDEPENDENT_AMBULATORY_CARE_PROVIDER_SITE_OTHER): Payer: Medicare Other | Admitting: Psychiatry

## 2020-10-13 DIAGNOSIS — F419 Anxiety disorder, unspecified: Secondary | ICD-10-CM

## 2020-10-13 DIAGNOSIS — F332 Major depressive disorder, recurrent severe without psychotic features: Secondary | ICD-10-CM

## 2020-10-13 MED ORDER — ESCITALOPRAM OXALATE 20 MG PO TABS: ORAL_TABLET | ORAL | 2 refills | Status: DC

## 2020-10-13 MED ORDER — LORAZEPAM 0.5 MG PO TABS: 0.5000 mg | ORAL_TABLET | Freq: Every day | ORAL | 0 refills | Status: DC | PRN

## 2020-10-13 NOTE — Progress Notes (Signed)
Virtual Visit via Telephone Note  I connected with [name] on 10/13/20 at  2:40 PM EST by telephone and verified that I am speaking with the correct person using two identifiers.  Location: Patient: Home Provider: Home Office   I discussed the limitations, risks, security and privacy concerns of performing an evaluation and management service by telephone and the availability of in person appointments. I also discussed with the patient that there may be a patient responsible charge related to this service. The patient expressed understanding and agreed to proceed.   History of Present Illness: Patient is evaluated by phone session.  He is on the phone by himself.  He is doing much better.  He decided not to take mirtazapine because he could not handle the grogginess.  He has not taken the lorazepam for more than a month and he is feeling less anxious.  He is sleeping good.  He started thinking about writing poetry and on the weekend he wanted to have visit with a friend who also shared the same hobby.  His appetite is okay.  His energy level is okay.  His weight is unchanged from the past.  He is more social and active.  He does not want to change the medication.  He has no tremors, shakes or any EPS.  He is happy his son came on Thanksgiving.  He may go to [place] after the Christmas because he does not like traffic during the holidays.  So far he is tolerating Lexapro.  Past Psychiatric History:Reviewed. H/Odepression and anxiety most of his life. Had a good response with TMS andLexapro. Tried Celexa, Pristiq and Cymbalta but limited outcome. PCP tried Klonopin but made him groggy.Noh/osuicidal attempt, mania, psychosis or self abusive behavior. Saw Dr. Cheryln Manly in the past for therapy.   Psychiatric Specialty Exam: Physical Exam  Review of Systems  Weight 138 lb (62.6 kg).There is no height or weight on file to calculate BMI.  General Appearance: NA  Eye Contact:  NA   Speech:  Clear and Coherent  Volume:  Normal  Mood:  Euthymic  Affect:  NA  Thought Process:  Goal Directed  Orientation:  Full (Time, Place, and Person)  Thought Content:  Logical  Suicidal Thoughts:  No  Homicidal Thoughts:  No  Memory:  Immediate;   Good Recent;   Good Remote;   Good  Judgement:  Good  Insight:  Good  Psychomotor Activity:  NA  Concentration:  Concentration: Good and Attention Span: Good  Recall:  Good  Fund of Knowledge:  Good  Language:  Good  Akathisia:  No  Handed:  Right  AIMS (if indicated):     Assets:  Communication Skills Desire for Improvement Housing Social Support Talents/Skills Transportation  ADL's:  Intact  Cognition:  WNL  Sleep:   ok      Assessment and Plan: Major depressive disorder, recurrent.  Anxiety.  Patient is not taking mirtazapine because of grogginess.  However he is doing well on the Lexapro and lorazepam as needed.  He has no side effects.  He does not want to change the medication.  We discussed medication side effects and benefits.  Continue Lexapro 30 mg daily and lorazepam 0.5 mg as needed.  We will provide 15 tablets of lorazepam so he can take as needed for anxiety.  Recommended to call us back if is any question or any concern.  Follow-up in 2 months.  Follow Up Instructions:    I discussed the assessment  and treatment plan with the patient. The patient was provided an opportunity to ask questions and all were answered. The patient agreed with the plan and demonstrated an understanding of the instructions.   The patient was advised to call back or seek an in-person evaluation if the symptoms worsen or if the condition fails to improve as anticipated.  I provided 9 minutes of non-face-to-face time during this encounter.   Kathlee Nations, MD

## 2020-11-13 DIAGNOSIS — H2513 Age-related nuclear cataract, bilateral: Secondary | ICD-10-CM | POA: Diagnosis not present

## 2020-11-13 DIAGNOSIS — H00015 Hordeolum externum left lower eyelid: Secondary | ICD-10-CM | POA: Diagnosis not present

## 2020-11-24 ENCOUNTER — Telehealth: Payer: Self-pay | Admitting: Family Medicine

## 2020-11-24 DIAGNOSIS — Z860100 Personal history of colon polyps, unspecified: Secondary | ICD-10-CM | POA: Insufficient documentation

## 2020-11-24 DIAGNOSIS — R140 Abdominal distension (gaseous): Secondary | ICD-10-CM | POA: Insufficient documentation

## 2020-11-24 DIAGNOSIS — R152 Fecal urgency: Secondary | ICD-10-CM | POA: Insufficient documentation

## 2020-11-24 DIAGNOSIS — Z1211 Encounter for screening for malignant neoplasm of colon: Secondary | ICD-10-CM | POA: Insufficient documentation

## 2020-11-24 DIAGNOSIS — R142 Eructation: Secondary | ICD-10-CM | POA: Insufficient documentation

## 2020-11-24 DIAGNOSIS — R630 Anorexia: Secondary | ICD-10-CM | POA: Insufficient documentation

## 2020-11-24 DIAGNOSIS — R6881 Early satiety: Secondary | ICD-10-CM | POA: Insufficient documentation

## 2020-11-24 DIAGNOSIS — R194 Change in bowel habit: Secondary | ICD-10-CM | POA: Insufficient documentation

## 2020-11-24 DIAGNOSIS — R1310 Dysphagia, unspecified: Secondary | ICD-10-CM | POA: Insufficient documentation

## 2020-11-24 NOTE — Telephone Encounter (Signed)
Shingrix is superior to prior Zostavax so would consider getting this.   Can get at local pharmacy.

## 2020-11-24 NOTE — Telephone Encounter (Signed)
Pt is calling in to see if she needs to have any additional Shingle vaccines since the last one that she had was on 11/01/2011.  Pt would like to have a call back.

## 2020-11-25 NOTE — Telephone Encounter (Signed)
Advised patient Per Dr Burchette to get Shingrix from pharmacy. 

## 2020-12-10 ENCOUNTER — Encounter (HOSPITAL_COMMUNITY): Payer: Self-pay | Admitting: Psychiatry

## 2020-12-10 ENCOUNTER — Other Ambulatory Visit: Payer: Self-pay

## 2020-12-10 ENCOUNTER — Telehealth (INDEPENDENT_AMBULATORY_CARE_PROVIDER_SITE_OTHER): Payer: Medicare Other | Admitting: Psychiatry

## 2020-12-10 DIAGNOSIS — F419 Anxiety disorder, unspecified: Secondary | ICD-10-CM

## 2020-12-10 DIAGNOSIS — F332 Major depressive disorder, recurrent severe without psychotic features: Secondary | ICD-10-CM | POA: Diagnosis not present

## 2020-12-10 MED ORDER — ESCITALOPRAM OXALATE 20 MG PO TABS: ORAL_TABLET | ORAL | 2 refills | Status: DC

## 2020-12-10 MED ORDER — LORAZEPAM 0.5 MG PO TABS: 0.5000 mg | ORAL_TABLET | Freq: Every day | ORAL | 1 refills | Status: DC | PRN

## 2020-12-10 NOTE — Progress Notes (Signed)
Virtual Visit via Video Note  I connected with [name] on 12/10/20 at  2:40 PM EST by a video enabled telemedicine application and verified that I am speaking with the correct person using two identifiers.  Location: Patient: Home Provider: Home Office   I discussed the limitations of evaluation and management by telemedicine and the availability of in person appointments. The patient expressed understanding and agreed to proceed.  History of Present Illness: Patient is evaluated by video session.  He is taking his medication.  He noticed for past few weeks taking the lorazepam more frequently because he was having a lot of anxiety.  He does not go for long driving and does not socialize as much but still go to the grocery stores.  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  He bought few books and is trying to keep himself busy.  He admitted since winter and cold weather came he has been more isolated.  Though he denies any crying spells, feeling of hopelessness or worthlessness.  He feels most of the time the medicine working very well.  He is sleeping good.  He is looking forward for more warm days so he can start play golf.  His appetite is okay.  His weight is stable.  He does drive when he needs to but usually he preferred to stay home.  He has no tremors, shakes or any EPS.  He is in touch with his son who lives in [place].  He had good holidays.  He like to keep his current medication.  He lives with his wife who is supportive.   Past Psychiatric History:Reviewed. H/Odepression and anxiety most of his life. Had a good response with TMS andLexapro. Tried Celexa, Pristiq and Cymbalta but limited outcome. PCP tried Klonopin but made him groggy.Noh/osuicidal attempt, mania, psychosis or self abusive behavior. Saw Dr. Cheryln Ward in the past for therapy.  Psychiatric Specialty Exam: Physical Exam  Review of Systems  Weight 138 lb (62.6 kg).There is no height or weight on  file to calculate BMI.  General Appearance: Casual  Eye Contact:  Good  Speech:  Clear and Coherent  Volume:  Normal  Mood:  Anxious  Affect:  Congruent  Thought Process:  Goal Directed  Orientation:  Full (Time, Place, and Person)  Thought Content:  Rumination  Suicidal Thoughts:  No  Homicidal Thoughts:  No  Memory:  Immediate;   Good Recent;   Good Remote;   Good  Judgement:  Intact  Insight:  Present  Psychomotor Activity:  Decreased  Concentration:  Concentration: Good and Attention Span: Good  Recall:  Good  Fund of Knowledge:  Good  Language:  Good  Akathisia:  No  Handed:  Right  AIMS (if indicated):     Assets:  Communication Skills Desire for Improvement Housing Resilience Social Support Transportation  ADL's:  Intact  Cognition:  WNL  Sleep:   fine      Assessment and Plan: Major depressive disorder, recurrent.  Anxiety.  Discuss recent increase anxiety which could be due to cold weather.  Overall patient feels medicines are working and does not want to change.  He is hoping once he has warmer days then he can go more frequently outside and play golf.  Discussed medication side effects and benefits.  Like to continue current medication.  Continue Lexapro 30 mg daily and lorazepam 0.5 mg as needed.  Recommended to call us back if is any question or any concern.  Follow-up in  3 months.  Follow Up Instructions:    I discussed the assessment and treatment plan with the patient. The patient was provided an opportunity to ask questions and all were answered. The patient agreed with the plan and demonstrated an understanding of the instructions.   The patient was advised to call back or seek an in-person evaluation if the symptoms worsen or if the condition fails to improve as anticipated.  I provided 18 minutes of non-face-to-face time during this encounter.   Kathlee Nations, MD

## 2020-12-14 ENCOUNTER — Ambulatory Visit (INDEPENDENT_AMBULATORY_CARE_PROVIDER_SITE_OTHER): Payer: Medicare Other

## 2020-12-14 DIAGNOSIS — Z0000 Encounter for general adult medical examination without abnormal findings: Secondary | ICD-10-CM | POA: Diagnosis not present

## 2020-12-14 NOTE — Patient Instructions (Signed)
[name] , Thank you for taking time to come for your Medicare Wellness Visit. I appreciate your ongoing commitment to your health goals. Please review the following plan we discussed and let me know if I can assist you in the future.   Screening recommendations/referrals: Colonoscopy: No longer required  Recommended yearly ophthalmology/optometry visit for glaucoma screening and checkup Recommended yearly dental visit for hygiene and checkup  Vaccinations: Influenza vaccine: Up to date, next due fall 2022  Pneumococcal vaccine: Completed series  Tdap vaccine: Up to date, next due 10/15/2023 Shingles vaccine: Currently due for Shingrix, if you wish to receive we recommend that you do so at your local pharmacy as it is less expensive     Advanced directives: Please bring in copies of your advanced medical directives so that we may scan them into your chart.  Conditions/risks identified: None   Next appointment: None   Preventive Care 65 Years and Older, Male Preventive care refers to lifestyle choices and visits with your health care provider that can promote health and wellness. What does preventive care include?  A yearly physical exam. This is also called an annual well check.  Dental exams once or twice a year.  Routine eye exams. Ask your health care provider how often you should have your eyes checked.  Personal lifestyle choices, including:  Daily care of your teeth and gums.  Regular physical activity.  Eating a healthy diet.  Avoiding tobacco and drug use.  Limiting alcohol use.  Practicing safe sex.  Taking low doses of aspirin every day.  Taking vitamin and mineral supplements as recommended by your health care provider. What happens during an annual well check? The services and screenings done by your health care provider during your annual well check will depend on your age, overall health, lifestyle risk factors, and family history of  disease. Counseling  Your health care provider may ask you questions about your:  Alcohol use.  Tobacco use.  Drug use.  Emotional well-being.  Home and relationship well-being.  Sexual activity.  Eating habits.  History of falls.  Memory and ability to understand (cognition).  Work and work Statistician. Screening  You may have the following tests or measurements:  Height, weight, and BMI.  Blood pressure.  Lipid and cholesterol levels. These may be checked every 5 years, or more frequently if you are over 74 years old.  Skin check.  Lung cancer screening. You may have this screening every year starting at age 13 if you have a 30-pack-year history of smoking and currently smoke or have quit within the past 15 years.  Fecal occult blood test (FOBT) of the stool. You may have this test every year starting at age 62.  Flexible sigmoidoscopy or colonoscopy. You may have a sigmoidoscopy every 5 years or a colonoscopy every 10 years starting at age 79.  Prostate cancer screening. Recommendations will vary depending on your family history and other risks.  Hepatitis C blood test.  Hepatitis B blood test.  Sexually transmitted disease (STD) testing.  Diabetes screening. This is done by checking your blood sugar (glucose) after you have not eaten for a while (fasting). You may have this done every 1-3 years.  Abdominal aortic aneurysm (AAA) screening. You may need this if you are a current or former smoker.  Osteoporosis. You may be screened starting at age 9 if you are at high risk. Talk with your health care provider about your test results, treatment options, and if necessary, the  need for more tests. Vaccines  Your health care provider may recommend certain vaccines, such as:  Influenza vaccine. This is recommended every year.  Tetanus, diphtheria, and acellular pertussis (Tdap, Td) vaccine. You may need a Td booster every 10 years.  Zoster vaccine. You may  need this after age 62.  Pneumococcal 13-valent conjugate (PCV13) vaccine. One dose is recommended after age 23.  Pneumococcal polysaccharide (PPSV23) vaccine. One dose is recommended after age 51. Talk to your health care provider about which screenings and vaccines you need and how often you need them. This information is not intended to replace advice given to you by your health care provider. Make sure you discuss any questions you have with your health care provider. Document Released: 11/13/2015 Document Revised: 07/06/2016 Document Reviewed: 08/18/2015 Elsevier Interactive Patient Education  2017 [employer] Prevention in the Home Falls can cause injuries. They can happen to people of all ages. There are many things you can do to make your home safe and to help prevent falls. What can I do on the outside of my home?  Regularly fix the edges of walkways and driveways and fix any cracks.  Remove anything that might make you trip as you walk through a door, such as a raised step or threshold.  Trim any bushes or trees on the path to your home.  Use bright outdoor lighting.  Clear any walking paths of anything that might make someone trip, such as rocks or tools.  Regularly check to see if handrails are loose or broken. Make sure that both sides of any steps have handrails.  Any raised decks and porches should have guardrails on the edges.  Have any leaves, snow, or ice cleared regularly.  Use sand or salt on walking paths during winter.  Clean up any spills in your garage right away. This includes oil or grease spills. What can I do in the bathroom?  Use night lights.  Install grab bars by the toilet and in the tub and shower. Do not use towel bars as grab bars.  Use non-skid mats or decals in the tub or shower.  If you need to sit down in the shower, use a plastic, non-slip stool.  Keep the floor dry. Clean up any water that spills on the floor as soon as it  happens.  Remove soap buildup in the tub or shower regularly.  Attach bath mats securely with double-sided non-slip rug tape.  Do not have throw rugs and other things on the floor that can make you trip. What can I do in the bedroom?  Use night lights.  Make sure that you have a light by your bed that is easy to reach.  Do not use any sheets or blankets that are too big for your bed. They should not hang down onto the floor.  Have a firm chair that has side arms. You can use this for support while you get dressed.  Do not have throw rugs and other things on the floor that can make you trip. What can I do in the kitchen?  Clean up any spills right away.  Avoid walking on wet floors.  Keep items that you use a lot in easy-to-reach places.  If you need to reach something above you, use a strong step stool that has a grab bar.  Keep electrical cords out of the way.  Do not use floor polish or wax that makes floors slippery. If you must use wax,  use non-skid floor wax.  Do not have throw rugs and other things on the floor that can make you trip. What can I do with my stairs?  Do not leave any items on the stairs.  Make sure that there are handrails on both sides of the stairs and use them. Fix handrails that are broken or loose. Make sure that handrails are as long as the stairways.  Check any carpeting to make sure that it is firmly attached to the stairs. Fix any carpet that is loose or worn.  Avoid having throw rugs at the top or bottom of the stairs. If you do have throw rugs, attach them to the floor with carpet tape.  Make sure that you have a light switch at the top of the stairs and the bottom of the stairs. If you do not have them, ask someone to add them for you. What else can I do to help prevent falls?  Wear shoes that:  Do not have high heels.  Have rubber bottoms.  Are comfortable and fit you well.  Are closed at the toe. Do not wear sandals.  If you  use a stepladder:  Make sure that it is fully opened. Do not climb a closed stepladder.  Make sure that both sides of the stepladder are locked into place.  Ask someone to hold it for you, if possible.  Clearly mark and make sure that you can see:  Any grab bars or handrails.  First and last steps.  Where the edge of each step is.  Use tools that help you move around (mobility aids) if they are needed. These include:  Canes.  Walkers.  Scooters.  Crutches.  Turn on the lights when you go into a dark area. Replace any light bulbs as soon as they burn out.  Set up your furniture so you have a clear path. Avoid moving your furniture around.  If any of your floors are uneven, fix them.  If there are any pets around you, be aware of where they are.  Review your medicines with your doctor. Some medicines can make you feel dizzy. This can increase your chance of falling. Ask your doctor what other things that you can do to help prevent falls. This information is not intended to replace advice given to you by your health care provider. Make sure you discuss any questions you have with your health care provider. Document Released: 08/13/2009 Document Revised: 03/24/2016 Document Reviewed: 11/21/2014 Elsevier Interactive Patient Education  2017 [employer].

## 2020-12-14 NOTE — Progress Notes (Signed)
Subjective:   Jack LEHENBAUER is a 85 y.o. male who presents for Medicare Annual/Subsequent preventive examination.  Virtual Visit via Video Note  I connected with Marlane Hatcher  by a video enabled telemedicine application and verified that I am speaking with the correct person using two identifiers.  Location: Patient: Home Provider: Office Persons participating in the virtual visit: patient, provider   I discussed the limitations of evaluation and management by telemedicine and the availability of in person appointments. The patient expressed understanding and agreed to proceed.     Larene Beach Crews,LPN    Review of Systems    N/A  Cardiac Risk Factors include: advanced age (>68men, >15 women);male gender;hypertension;dyslipidemia     Objective:    Today's Vitals   There is no height or weight on file to calculate BMI.  Advanced Directives 12/14/2020 02/22/2019 02/20/2019 02/16/2019 02/13/2019 11/12/2018 11/03/2017  Does Patient Have a Medical Advance Directive? Yes Yes Yes No Yes Yes Yes  Type of Paramedic of [place];Living will [place];Living will [place];Living will - Healthcare Power of [place];Living will -  Does patient want to make changes to medical advance directive? No - Patient declined No - Guardian declined No - Patient declined - - Yes (MAU/Ambulatory/Procedural Areas - Information given) -  Copy of [place] in Chart? No - copy requested - - - - No - copy requested -  Would patient like information on creating a medical advance directive? - No - Patient declined - No - Patient declined - - -    Current Medications (verified) Outpatient Encounter Medications as of 12/14/2020  Medication Sig  . acetaminophen (TYLENOL) 500 MG tablet Take 1,000 mg by mouth every 6 (six) hours as needed for moderate pain or headache.  Marland Kitchen amLODipine (NORVASC) 5 MG tablet  TAKE 1 TABLET BY MOUTH DAILY  . cyanocobalamin 2000 MCG tablet Take 2,000 mcg by mouth daily.  Marland Kitchen escitalopram (LEXAPRO) 20 MG tablet TAKE 1 AND 1/2 TABLETS(30 MG) BY MOUTH DAILY  . finasteride (PROSCAR) 5 MG tablet Take 5 mg by mouth daily.  . hydrocortisone 2.5 % cream Apply topically 2 (two) times daily as needed. (Patient taking differently: Apply 1 application topically 2 (two) times daily as needed (rash).)  . LORazepam (ATIVAN) 0.5 MG tablet Take 1 tablet (0.5 mg total) by mouth daily as needed for anxiety.  Marland Kitchen omeprazole (PRILOSEC) 40 MG capsule TAKE 1 CAPSULE BY MOUTH  DAILY  . mirtazapine (REMERON) 15 MG tablet Take 1 tablet (15 mg total) by mouth at bedtime. (Patient not taking: No sig reported)   No facility-administered encounter medications on file as of 12/14/2020.    Allergies (verified) Dexlansoprazole   History: Past Medical History:  Diagnosis Date  . Anxiety   . BPH (benign prostatic hypertrophy)   . DDD (degenerative disc disease), lumbar 2002  . Depression   . Difficult intubation 02/22/2019   Anterior with limited oral opening  . Essential hypertension 03/10/2019  . GERD (gastroesophageal reflux disease)   . History of colon polyps   . History of hiatal hernia 2012  . Hyperlipemia    Mild  . Indwelling Foley catheter present   . Iron deficiency anemia    IV iron therapy   . Mild hyperlipidemia 02/20/2008   Qualifier: Diagnosis of  By: Leanne Chang MD, Bruce    . Pre-diabetes   . Renal cyst 2008   7.6 cm lower pole right  renal cyst  . Sleep apnea    slight, no CPAP used   Past Surgical History:  Procedure Laterality Date  . COLONOSCOPY  08/2011  . NASAL SEPTOPLASTY W/ TURBINOPLASTY  08/03/2012   Procedure: NASAL SEPTOPLASTY WITH TURBINATE REDUCTION;  Surgeon: Izora Gala, MD;  Location: [place];  Service: ENT;  Laterality: Bilateral;  . UPPER GASTROINTESTINAL ENDOSCOPY  08/2011  . XI ROBOTIC ASSISTED SIMPLE PROSTATECTOMY N/A 02/22/2019   Procedure: XI ROBOTIC  ASSISTED SIMPLE PROSTATECTOMY;  Surgeon: Alexis Frock, MD;  Location: [facility];  Service: Urology;  Laterality: N/A;   Family History  Problem Relation Age of Onset  . Heart attack Father   . Hypertension Father   . Hypertension Daughter   . Hypertension Brother   . Hypertension Brother   . Hypertension Sister    Social History   Socioeconomic History  . Marital status: Married    Spouse name: Not on file  . Number of children: 2  . Years of education: Not on file  . Highest education level: Not on file  Occupational History  . Not on file  Tobacco Use  . Smoking status: Former Research scientist (life sciences)  . Smokeless tobacco: Never Used  . Tobacco comment: rare use over 50 years   Vaping Use  . Vaping Use: Never used  Substance and Sexual Activity  . Alcohol use: Yes    Alcohol/week: 1.0 standard drink    Types: 1 Shots of liquor per week    Comment: weekly  . Drug use: No  . Sexual activity: Never    Birth control/protection: None  Other Topics Concern  . Not on file  Social History Narrative   Lives in 2 level    Will stay for now    Just moved here approx 1.5 yo   Very nice neighbors    [employer]  x 5 with a very busy holiday   One son is a Teacher, music and nephew is a Investment banker, operational    Son in [place] moved to [place] and he misses his grand children    As 2 brothers in [place]       Retired from [employer], [employer]       Social Determinants of Radio broadcast assistant Strain: [place]   . Difficulty of Paying Living Expenses: Not hard at all  Food Insecurity: No Food Insecurity  . Worried About Charity fundraiser in the Last Year: Never true  . Ran Out of Food in the Last Year: Never true  Transportation Needs: No Transportation Needs  . Lack of Transportation (Medical): No  . Lack of Transportation (Non-Medical): No  Physical Activity: Sufficiently Active  . Days of Exercise per Week: 4 days  . Minutes of Exercise per Session: 40 min  Stress: No Stress Concern Present  . Feeling  of Stress : Not at all  Social Connections: Moderately Isolated  . Frequency of Communication with Friends and Family: More than three times a week  . Frequency of Social Gatherings with Friends and Family: Once a week  . Attends Religious Services: Never  . Active Member of Clubs or Organizations: No  . Attends Archivist Meetings: Never  . Marital Status: Married    Tobacco Counseling Counseling given: Not Answered Comment: rare use over 50 years    Clinical Intake:  Pre-visit preparation completed: Yes  Pain : No/denies pain     Diabetes: No  How often do you need to have someone help you when  you read instructions, pamphlets, or other written materials from your doctor or pharmacy?: 1 - Never What is the last grade level you completed in school?: Graduate School  Diabetic?No   Interpreter Needed?: No  Information entered by :: [place] of Daily Living In your present state of health, do you have any difficulty performing the following activities: 12/14/2020  Hearing? N  Vision? N  Difficulty concentrating or making decisions? N  Walking or climbing stairs? N  Dressing or bathing? N  Doing errands, shopping? N  Preparing Food and eating ? N  Using the Toilet? N  In the past six months, have you accidently leaked urine? N  Do you have problems with loss of bowel control? N  Managing your Medications? N  Managing your Finances? N  Housekeeping or managing your Housekeeping? N  Some recent data might be hidden    Patient Care Team: [name], MD as PCP - General (Family Medicine) Juanita Craver, MD as Consulting Physician (Gastroenterology) Calvert Cantor, MD as Consulting Physician (Ophthalmology) Adele Schilder Arlyce Harman, MD as Consulting Physician (Psychiatry) Nicholas Lose, MD as Consulting Physician (Hematology and Oncology)  Indicate any recent Medical Services you may have received from other than [facility] providers in the past year  (date may be approximate).     Assessment:   This is a routine wellness examination for [place].  Hearing/Vision screen  Hearing Screening   125Hz  250Hz  500Hz  1000Hz  2000Hz  3000Hz  4000Hz  6000Hz  8000Hz   Right ear:           Left ear:           Vision Screening Comments: Patient gets eyes examined once per year. Wears glasses   Dietary issues and exercise activities discussed: Current Exercise Habits: Home exercise routine, Type of exercise: walking, Time (Minutes): 45, Frequency (Times/Week): 4, Weekly Exercise (Minutes/Week): 180  Goals    . Exercise 150 min/wk Moderate Activity     Start playing golf and getting back to your routine     . patient     More engaged in social event  More exercise / more outings    . Patient Stated     Increase weight to goal of 145lbs      Depression Screen PHQ 2/9 Scores 12/14/2020 12/04/2019 11/12/2018 09/05/2018 02/06/2017 06/17/2016 09/08/2015  PHQ - 2 Score 0 3 0 0 1 0 0  PHQ- 9 Score - 3 1 0 - - -    Fall Risk Fall Risk  12/14/2020 12/04/2019 11/12/2018 11/03/2017 02/06/2017  Falls in the past year? 0 0 0 No No  Number falls in past yr: 0 0 - - -  Injury with Fall? 0 0 - - -  Risk for fall due to : No Fall Risks - - - -  Follow up Falls evaluation completed;Falls prevention discussed - - - -    FALL RISK PREVENTION PERTAINING TO THE HOME:  Any stairs in or around the home? Yes  If so, are there any without handrails? No  Home free of loose throw rugs in walkways, pet beds, electrical cords, etc? Yes  Adequate lighting in your home to reduce risk of falls? Yes   ASSISTIVE DEVICES UTILIZED TO PREVENT FALLS:  Life alert? No  Use of a cane, walker or w/c? No  Grab bars in the bathroom? Yes  Shower chair or bench in shower? No  Elevated toilet seat or a handicapped toilet? No     Cognitive Function: Normal cognitive status assessed  by direct observation by this Nurse Health Advisor. No abnormalities found.   MMSE - Mini Mental State Exam  11/03/2017 11/03/2017 06/17/2016  Not completed: Refused (No Data) (No Data)        Immunizations Immunization History  Administered Date(s) Administered  . Influenza Split 12/10/2011, 07/09/2012  . Influenza Whole 08/11/2008, 07/31/2009  . Influenza, High Dose Seasonal PF 07/31/2013, 08/24/2014, 07/10/2015, 07/15/2016, 08/15/2017, 09/05/2018, 07/09/2019  . Influenza-Unspecified 07/15/2020  . PFIZER(Purple Top)SARS-COV-2 Vaccination 11/20/2019, 12/08/2019, 08/02/2020  . Pneumococcal Conjugate-13 08/29/2014  . Pneumococcal Polysaccharide-23 01/21/2005  . Td 04/10/2003, 10/14/2013  . Zoster 11/01/2011    TDAP status: Up to date  Flu Vaccine status: Due, Education has been provided regarding the importance of this vaccine. Advised may receive this vaccine at local pharmacy or Health Dept. Aware to provide a copy of the vaccination record if obtained from local pharmacy or Health Dept. Verbalized acceptance and understanding.  Pneumococcal vaccine status: Up to date  Covid-19 vaccine status: Completed vaccines  Qualifies for Shingles Vaccine? Yes   Zostavax completed Yes   Shingrix Completed?: No.    Education has been provided regarding the importance of this vaccine. Patient has been advised to call insurance company to determine out of pocket expense if they have not yet received this vaccine. Advised may also receive vaccine at local pharmacy or Health Dept. Verbalized acceptance and understanding.  Screening Tests Health Maintenance  Topic Date Due  . TETANUS/TDAP  10/15/2023  . INFLUENZA VACCINE  Completed  . COVID-19 Vaccine  Completed  . PNA vac Low Risk Adult  Completed    Health Maintenance  There are no preventive care reminders to display for this patient.  Colorectal cancer screening: No longer required.   Lung Cancer Screening: (Low Dose CT Chest recommended if Age 44-80 years, 30 pack-year currently smoking OR have quit w/in 15years.) does not qualify.   Lung  Cancer Screening Referral: N/A   Additional Screening:  Hepatitis C Screening: does not qualify;   Vision Screening: Recommended annual ophthalmology exams for early detection of glaucoma and other disorders of the eye. Is the patient up to date with their annual eye exam?  Yes  Who is the provider or what is the name of the office in which the patient attends annual eye exams? Dr. Bing Plume  If pt is not established with a provider, would they like to be referred to a provider to establish care? No .   Dental Screening: Recommended annual dental exams for proper oral hygiene  Community Resource Referral / Chronic Care Management: CRR required this visit?  No   CCM required this visit?  No      Plan:     I have personally reviewed and noted the following in the patient's chart:   . Medical and social history . Use of alcohol, tobacco or illicit drugs  . Current medications and supplements . Functional ability and status . Nutritional status . Physical activity . Advanced directives . List of other physicians . Hospitalizations, surgeries, and ER visits in previous 12 months . Vitals . Screenings to include cognitive, depression, and falls . Referrals and appointments  In addition, I have reviewed and discussed with patient certain preventive protocols, quality metrics, and best practice recommendations. A written personalized care plan for preventive services as well as general preventive health recommendations were provided to patient.     Ofilia Neas, LPN   8/34/1960   Nurse Notes: None

## 2020-12-22 ENCOUNTER — Other Ambulatory Visit: Payer: Self-pay | Admitting: Family Medicine

## 2020-12-31 ENCOUNTER — Other Ambulatory Visit (HOSPITAL_COMMUNITY): Payer: Self-pay | Admitting: *Deleted

## 2020-12-31 ENCOUNTER — Telehealth (HOSPITAL_COMMUNITY): Payer: Self-pay | Admitting: *Deleted

## 2020-12-31 DIAGNOSIS — F419 Anxiety disorder, unspecified: Secondary | ICD-10-CM

## 2020-12-31 MED ORDER — LORAZEPAM 0.5 MG PO TABS: 0.5000 mg | ORAL_TABLET | Freq: Every day | ORAL | 1 refills | Status: DC | PRN

## 2020-12-31 NOTE — Telephone Encounter (Signed)
Yes he can have an early refill because sometimes he takes daily.

## 2020-12-31 NOTE — Telephone Encounter (Signed)
Pt called requesting refill of the Ativan. He has one fill left but it's too early last Rx was for #15 so sure this is why he's asking early. Appointment on 03/09/21. Do you want him to wait until fill date or give another #?

## 2021-01-23 ENCOUNTER — Other Ambulatory Visit: Payer: Self-pay | Admitting: Family Medicine

## 2021-01-27 ENCOUNTER — Other Ambulatory Visit: Payer: Self-pay | Admitting: Family Medicine

## 2021-02-17 ENCOUNTER — Other Ambulatory Visit: Payer: Self-pay

## 2021-02-17 ENCOUNTER — Encounter: Payer: Self-pay | Admitting: Family Medicine

## 2021-02-17 ENCOUNTER — Ambulatory Visit (INDEPENDENT_AMBULATORY_CARE_PROVIDER_SITE_OTHER): Payer: Medicare Other | Admitting: Family Medicine

## 2021-02-17 VITALS — BP 140/70 | HR 70 | Temp 98.0°F | Ht 67.0 in | Wt 145.4 lb

## 2021-02-17 DIAGNOSIS — E785 Hyperlipidemia, unspecified: Secondary | ICD-10-CM | POA: Diagnosis not present

## 2021-02-17 DIAGNOSIS — Z0000 Encounter for general adult medical examination without abnormal findings: Secondary | ICD-10-CM | POA: Diagnosis not present

## 2021-02-17 LAB — BASIC METABOLIC PANEL
BUN: 14 mg/dL (ref 6–23)
CO2: 27 mEq/L (ref 19–32)
Calcium: 8.8 mg/dL (ref 8.4–10.5)
Chloride: 100 mEq/L (ref 96–112)
Creatinine, Ser: 0.77 mg/dL (ref 0.40–1.50)
GFR: 83.35 mL/min (ref >60.00)
Glucose, Bld: 101 mg/dL — ABNORMAL HIGH (ref 70–99)
Potassium: 4.6 mEq/L (ref 3.5–5.1)
Sodium: 134 mEq/L — ABNORMAL LOW (ref 135–145)

## 2021-02-17 LAB — CBC WITH DIFFERENTIAL/PLATELET
Basophils Absolute: 0 10*3/uL (ref 0.0–0.1)
Basophils Relative: 0.8 % (ref 0.0–3.0)
Eosinophils Absolute: 0.2 10*3/uL (ref 0.0–0.7)
Eosinophils Relative: 3.4 % (ref 0.0–5.0)
HCT: 39.6 % (ref 39.0–52.0)
Hemoglobin: 12.8 g/dL — ABNORMAL LOW (ref 13.0–17.0)
Lymphocytes Relative: 21.7 % (ref 12.0–46.0)
Lymphs Abs: 1.3 10*3/uL (ref 0.7–4.0)
MCHC: 32.5 g/dL (ref 30.0–36.0)
MCV: 78.3 fl (ref 78.0–100.0)
Monocytes Absolute: 0.6 10*3/uL (ref 0.1–1.0)
Monocytes Relative: 10.8 % (ref 3.0–12.0)
Neutro Abs: 3.7 10*3/uL (ref 1.4–7.7)
Neutrophils Relative %: 63.3 % (ref 43.0–77.0)
Platelets: 213 10*3/uL (ref 150.0–400.0)
RBC: 5.06 Mil/uL (ref 4.22–5.81)
RDW: 15.1 % (ref 11.5–15.5)
WBC: 5.8 10*3/uL (ref 4.0–10.5)

## 2021-02-17 LAB — LIPID PANEL
Cholesterol: 194 mg/dL (ref 0–200)
HDL: 71.6 mg/dL (ref >39.00)
LDL Cholesterol: 112 mg/dL — ABNORMAL HIGH (ref 0–99)
NonHDL: 122.8
Total CHOL/HDL Ratio: 3
Triglycerides: 55 mg/dL (ref 0.0–149.0)
VLDL: 11 mg/dL (ref 0.0–40.0)

## 2021-02-17 LAB — TSH: TSH: 3.02 u[IU]/mL (ref 0.35–4.50)

## 2021-02-17 LAB — HEPATIC FUNCTION PANEL
ALT: 15 U/L (ref 0–53)
AST: 14 U/L (ref 0–37)
Albumin: 3.8 g/dL (ref 3.5–5.2)
Alkaline Phosphatase: 56 U/L (ref 39–117)
Bilirubin, Direct: 0.1 mg/dL (ref 0.0–0.3)
Total Bilirubin: 0.6 mg/dL (ref 0.2–1.2)
Total Protein: 6.6 g/dL (ref 6.0–8.3)

## 2021-02-17 MED ORDER — TRIAMCINOLONE ACETONIDE 0.1 % EX CREA: 1.0000 "application " | TOPICAL_CREAM | Freq: Two times a day (BID) | CUTANEOUS | 1 refills | Status: DC

## 2021-02-17 NOTE — Progress Notes (Signed)
Established Patient Office Visit  Subjective:  Patient ID: [name], male    DOB: 27-Jan-1936  Age: 85 y.o. MRN: [id number]  CC:  Chief Complaint  Patient presents with  . Annual Exam    No new concerns    HPI [name] presents for physical exam.  Chronic problems include history of hypertension, GERD, BPH, recurrent depression, hyperlipidemia.  He is followed by psychiatry for depression and currently on high-dose Lexapro and he feels that his depression is stable.  He enjoys playing golf and has been going to the [facility] and exercising fairly regularly.  His only real complaint today is increased itching involving his skin including back, upper extremities, and thighs.  He has tried 2.5% hydrocortisone cream with limited benefit.  Uses moisturizing lotions regularly.  Does have some general fatigue which he thinks may be just age-related.  Health maintenance reviewed  -No further screening colonoscopy secondary to age -Flu vaccine already given -Tetanus due 2024 -Pneumonia vaccines complete -COVID vaccines up-to-date -In process of getting Shingrix.  He has had 1 of 2.  Family history-Father died age 3 of MI.  Mother died age 41 of "old age ".  She apparently had some mild dementia.  1 brother had brain tumor of some type.  Sister with history of recurrent depression.  Social history-he is married and has 2 sons and 5 grandchildren.  He is retired.  Quit smoking many years ago.  No regular alcohol use.  Past Medical History:  Diagnosis Date  . Anxiety   . BPH (benign prostatic hypertrophy)   . DDD (degenerative disc disease), lumbar 2002  . Depression   . Difficult intubation 02/22/2019   Anterior with limited oral opening  . Essential hypertension 03/10/2019  . GERD (gastroesophageal reflux disease)   . History of colon polyps   . History of hiatal hernia 2012  . Hyperlipemia    Mild  . Indwelling Foley catheter present   . Iron deficiency anemia    IV iron  therapy   . Mild hyperlipidemia 02/20/2008   Qualifier: Diagnosis of  By: Leanne Chang MD, Quincey Quesinberry    . Pre-diabetes   . Renal cyst 2008   7.6 cm lower pole right renal cyst  . Sleep apnea    slight, no CPAP used    Past Surgical History:  Procedure Laterality Date  . COLONOSCOPY  08/2011  . NASAL SEPTOPLASTY W/ TURBINOPLASTY  08/03/2012   Procedure: NASAL SEPTOPLASTY WITH TURBINATE REDUCTION;  Surgeon: Izora Gala, MD;  Location: [place];  Service: ENT;  Laterality: Bilateral;  . UPPER GASTROINTESTINAL ENDOSCOPY  08/2011  . XI ROBOTIC ASSISTED SIMPLE PROSTATECTOMY N/A 02/22/2019   Procedure: XI ROBOTIC ASSISTED SIMPLE PROSTATECTOMY;  Surgeon: Alexis Frock, MD;  Location[facility];  Service: Urology;  Laterality: N/A;    Family History  Problem Relation Age of Onset  . Heart attack Father   . Hypertension Father   . Hypertension Daughter   . Hypertension Brother   . Hypertension Brother   . Hypertension Sister     Social History   Socioeconomic History  . Marital status: Married    Spouse name: Not on file  . Number of children: 2  . Years of education: Not on file  . Highest education level: Not on file  Occupational History  . Not on file  Tobacco Use  . Smoking status: Former Research scientist (life sciences)  . Smokeless tobacco: Never Used  . Tobacco comment: rare use over 50 years  Vaping Use  . Vaping Use: Never used  Substance and Sexual Activity  . Alcohol use: Yes    Alcohol/week: 1.0 standard drink    Types: 1 Shots of liquor per week    Comment: weekly  . Drug use: No  . Sexual activity: Never    Birth control/protection: None  Other Topics Concern  . Not on file  Social History Narrative   Lives in 2 level    Will stay for now    Just moved here approx 1.5 yo   Very nice neighbors    [employer]  x 5 with a very busy holiday   One son is a Teacher, music and nephew is a Investment banker, operational    Son in [place] moved to [place] and he misses his grand children    As 2 brothers in [place]        Retired from [employer], [employer]       Social Determinants of Radio broadcast assistant Strain: [place]   . Difficulty of Paying Living Expenses: Not hard at all  Food Insecurity: No Food Insecurity  . Worried About Charity fundraiser in the Last Year: Never true  . Ran Out of Food in the Last Year: Never true  Transportation Needs: No Transportation Needs  . Lack of Transportation (Medical): No  . Lack of Transportation (Non-Medical): No  Physical Activity: Sufficiently Active  . Days of Exercise per Week: 4 days  . Minutes of Exercise per Session: 40 min  Stress: No Stress Concern Present  . Feeling of Stress : Not at all  Social Connections: Moderately Isolated  . Frequency of Communication with Friends and Family: More than three times a week  . Frequency of Social Gatherings with Friends and Family: Once a week  . Attends Religious Services: Never  . Active Member of Clubs or Organizations: No  . Attends Archivist Meetings: Never  . Marital Status: Married  Human resources officer Violence: Not At Risk  . Fear of Current or Ex-Partner: No  . Emotionally Abused: No  . Physically Abused: No  . Sexually Abused: No    Outpatient Medications Prior to Visit  Medication Sig Dispense Refill  . acetaminophen (TYLENOL) 500 MG tablet Take 1,000 mg by mouth every 6 (six) hours as needed for moderate pain or headache.    Marland Kitchen amLODipine (NORVASC) 5 MG tablet TAKE 1 TABLET BY MOUTH DAILY 90 tablet 0  . cyanocobalamin 2000 MCG tablet Take 2,000 mcg by mouth daily.    Marland Kitchen escitalopram (LEXAPRO) 20 MG tablet TAKE 1 AND 1/2 TABLETS(30 MG) BY MOUTH DAILY 45 tablet 2  . finasteride (PROSCAR) 5 MG tablet Take 5 mg by mouth daily.    . hydrocortisone 2.5 % cream Apply topically 2 (two) times daily as needed. (Patient taking differently: Apply 1 application topically 2 (two) times daily as needed (rash).) 30 g 1  . LORazepam (ATIVAN) 0.5 MG tablet Take 1 tablet (0.5 mg total) by mouth daily as needed  for anxiety. 15 tablet 1  . omeprazole (PRILOSEC) 40 MG capsule TAKE 1 CAPSULE BY MOUTH  DAILY 90 capsule 3  . mirtazapine (REMERON) 15 MG tablet Take 1 tablet (15 mg total) by mouth at bedtime. 30 tablet 0   No facility-administered medications prior to visit.    Allergies  Allergen Reactions  . Dexlansoprazole     Other reaction(s): Unknown    ROS Review of Systems  Constitutional: Positive for fatigue. Negative for chills  and fever.  Eyes: Negative for visual disturbance.  Respiratory: Negative for cough, chest tightness and shortness of breath.   Cardiovascular: Negative for chest pain, palpitations and leg swelling.  Gastrointestinal: Negative for abdominal pain.  Genitourinary: Negative for dysuria.  Musculoskeletal: Positive for arthralgias.  Neurological: Negative for dizziness, syncope, weakness, light-headedness and headaches.  Hematological: Negative for adenopathy. Does not bruise/bleed easily.  Psychiatric/Behavioral: Negative for confusion.      Objective:    Physical Exam Vitals reviewed.  Constitutional:      Appearance: Normal appearance.  HENT:     Right Ear: Tympanic membrane normal.     Left Ear: Tympanic membrane normal.  Cardiovascular:     Rate and Rhythm: Normal rate and regular rhythm.  Pulmonary:     Effort: Pulmonary effort is normal.     Breath sounds: Normal breath sounds.  Abdominal:     Palpations: Abdomen is soft. There is no mass.     Tenderness: There is no abdominal tenderness. There is no guarding or rebound.  Musculoskeletal:     Cervical back: Neck supple.     Right lower leg: No edema.     Left lower leg: No edema.  Lymphadenopathy:     Cervical: No cervical adenopathy.  Skin:    Findings: No rash.     Comments: Mild skin dryness but no specific rash  Neurological:     General: No focal deficit present.     Mental Status: He is alert.  Psychiatric:        Mood and Affect: Mood normal.     BP 140/70 (BP Location: Left  Arm, Patient Position: Sitting, Cuff Size: Normal)   Pulse 70   Temp 98 F (36.7 C) (Oral)   Ht 5\' 7"  (1.702 m)   Wt 145 lb 6.4 oz (66 kg)   SpO2 98%   BMI 22.77 kg/m  Wt Readings from Last 3 Encounters:  02/17/21 145 lb 6.4 oz (66 kg)  12/04/19 143 lb 11.2 oz (65.2 kg)  03/26/19 130 lb 1.6 oz (59 kg)     There are no preventive care reminders to display for this patient.  There are no preventive care reminders to display for this patient.  Lab Results  Component Value Date   TSH 3.28 12/12/2019   Lab Results  Component Value Date   WBC 5.5 12/12/2019   HGB 13.1 12/12/2019   HCT 40.9 12/12/2019   MCV 80.5 12/12/2019   PLT 197.0 12/12/2019   Lab Results  Component Value Date   NA 138 12/12/2019   K 4.5 12/12/2019   CO2 28 12/12/2019   GLUCOSE 104 (H) 12/12/2019   BUN 18 12/12/2019   CREATININE 0.87 12/12/2019   BILITOT 0.4 12/12/2019   ALKPHOS 50 12/12/2019   AST 13 12/12/2019   ALT 15 12/12/2019   PROT 6.6 12/12/2019   ALBUMIN 3.9 12/12/2019   CALCIUM 8.9 12/12/2019   ANIONGAP 7 03/26/2019   GFR 84.14 12/12/2019   Lab Results  Component Value Date   CHOL 186 12/12/2019   Lab Results  Component Value Date   HDL 58.20 12/12/2019   Lab Results  Component Value Date   LDLCALC 115 (H) 12/12/2019   Lab Results  Component Value Date   TRIG 65.0 12/12/2019   Lab Results  Component Value Date   CHOLHDL 3 12/12/2019   Lab Results  Component Value Date   HGBA1C 6.1 12/12/2019      Assessment & Plan:   Problem  List Items Addressed This Visit   None   Visit Diagnoses    Physical exam    -  Primary   Relevant Orders   Basic metabolic panel   Lipid panel   CBC with Differential/Platelet   TSH   Hepatic function panel    Patient has history of recurrent depression currently followed by psychiatry and stable on Lexapro.  He is in the process of getting second shingles vaccine.  Other vaccines up-to-date.  We discussed the following  -Obtain  follow-up screening labs -Triamcinolone 0.1% cream to use once or twice daily to areas of pruritus and he will combine 50-50 with moisturizer -Continue regular exercise habits -Continue annual flu vaccine -Discussed fall prevention  Meds ordered this encounter  Medications  . triamcinolone cream (KENALOG) 0.1 %    Sig: Apply 1 application topically 2 (two) times daily.    Dispense:  454 g    Refill:  1    Follow-up: No follow-ups on file.    Carolann Littler, MD

## 2021-02-17 NOTE — Patient Instructions (Signed)
Preventive Care 65 Years and Older, Male Preventive care refers to lifestyle choices and visits with your health care provider that can promote health and wellness. This includes:  A yearly physical exam. This is also called an annual wellness visit.  Regular dental and eye exams.  Immunizations.  Screening for certain conditions.  Healthy lifestyle choices, such as: ? Eating a healthy diet. ? Getting regular exercise. ? Not using drugs or products that contain nicotine and tobacco. ? Limiting alcohol use. What can I expect for my preventive care visit? Physical exam Your health care provider will check your:  Height and weight. These may be used to calculate your BMI (body mass index). BMI is a measurement that tells if you are at a healthy weight.  Heart rate and blood pressure.  Body temperature.  Skin for abnormal spots. Counseling Your health care provider may ask you questions about your:  Past medical problems.  Family's medical history.  Alcohol, tobacco, and drug use.  Emotional well-being.  Home life and relationship well-being.  Sexual activity.  Diet, exercise, and sleep habits.  History of falls.  Memory and ability to understand (cognition).  Work and work environment.  Access to firearms. What immunizations do I need? Vaccines are usually given at various ages, according to a schedule. Your health care provider will recommend vaccines for you based on your age, medical history, and lifestyle or other factors, such as travel or where you work.   What tests do I need? Blood tests  Lipid and cholesterol levels. These may be checked every 5 years, or more often depending on your overall health.  Hepatitis C test.  Hepatitis B test. Screening  Lung cancer screening. You may have this screening every year starting at age 55 if you have a 30-pack-year history of smoking and currently smoke or have quit within the past 15 years.  Colorectal  cancer screening. ? All adults should have this screening starting at age 50 and continuing until age 75. ? Your health care provider may recommend screening at age 45 if you are at increased risk. ? You will have tests every 1-10 years, depending on your results and the type of screening test.  Prostate cancer screening. Recommendations will vary depending on your family history and other risks.  Genital exam to check for testicular cancer or hernias.  Diabetes screening. ? This is done by checking your blood sugar (glucose) after you have not eaten for a while (fasting). ? You may have this done every 1-3 years.  Abdominal aortic aneurysm (AAA) screening. You may need this if you are a current or former smoker.  STD (sexually transmitted disease) testing, if you are at risk. Follow these instructions at home: Eating and drinking  Eat a diet that includes fresh fruits and vegetables, whole grains, lean protein, and low-fat dairy products. Limit your intake of foods with high amounts of sugar, saturated fats, and salt.  Take vitamin and mineral supplements as recommended by your health care provider.  Do not drink alcohol if your health care provider tells you not to drink.  If you drink alcohol: ? Limit how much you have to 0-2 drinks a day. ? Be aware of how much alcohol is in your drink. In the [place] one drink equals one 12 oz bottle of beer (355 mL), one 5 oz glass of wine (148 mL), or one 1 oz glass of hard liquor (44 mL).   Lifestyle  Take daily care of your teeth   and gums. Brush your teeth every morning and night with fluoride toothpaste. Floss one time each day.  Stay active. Exercise for at least 30 minutes 5 or more days each week.  Do not use any products that contain nicotine or tobacco, such as cigarettes, e-cigarettes, and chewing tobacco. If you need help quitting, ask your health care provider.  Do not use drugs.  If you are sexually active, practice safe sex.  Use a condom or other form of protection to prevent STIs (sexually transmitted infections).  Talk with your health care provider about taking a low-dose aspirin or statin.  Find healthy ways to cope with stress, such as: ? Meditation, yoga, or listening to music. ? Journaling. ? Talking to a trusted person. ? Spending time with friends and family. Safety  Always wear your seat belt while driving or riding in a vehicle.  Do not drive: ? If you have been drinking alcohol. Do not ride with someone who has been drinking. ? When you are tired or distracted. ? While texting.  Wear a helmet and other protective equipment during sports activities.  If you have firearms in your house, make sure you follow all gun safety procedures. What's next?  Visit your health care provider once a year for an annual wellness visit.  Ask your health care provider how often you should have your eyes and teeth checked.  Stay up to date on all vaccines. This information is not intended to replace advice given to you by your health care provider. Make sure you discuss any questions you have with your health care provider. Document Revised: 07/16/2019 Document Reviewed: 10/11/2018 Elsevier Patient Education  2021 Elsevier Inc.  

## 2021-02-23 DIAGNOSIS — H43813 Vitreous degeneration, bilateral: Secondary | ICD-10-CM | POA: Diagnosis not present

## 2021-02-23 DIAGNOSIS — H2513 Age-related nuclear cataract, bilateral: Secondary | ICD-10-CM | POA: Diagnosis not present

## 2021-02-23 DIAGNOSIS — H35372 Puckering of macula, left eye: Secondary | ICD-10-CM | POA: Diagnosis not present

## 2021-02-23 DIAGNOSIS — H5010 Unspecified exotropia: Secondary | ICD-10-CM | POA: Diagnosis not present

## 2021-03-06 ENCOUNTER — Other Ambulatory Visit (HOSPITAL_COMMUNITY): Payer: Self-pay | Admitting: Psychiatry

## 2021-03-06 DIAGNOSIS — F332 Major depressive disorder, recurrent severe without psychotic features: Secondary | ICD-10-CM

## 2021-03-09 ENCOUNTER — Other Ambulatory Visit: Payer: Self-pay

## 2021-03-09 ENCOUNTER — Encounter (HOSPITAL_COMMUNITY): Payer: Self-pay | Admitting: Psychiatry

## 2021-03-09 ENCOUNTER — Telehealth (INDEPENDENT_AMBULATORY_CARE_PROVIDER_SITE_OTHER): Payer: Medicare Other | Admitting: Psychiatry

## 2021-03-09 VITALS — Wt 145.0 lb

## 2021-03-09 DIAGNOSIS — F332 Major depressive disorder, recurrent severe without psychotic features: Secondary | ICD-10-CM

## 2021-03-09 DIAGNOSIS — F419 Anxiety disorder, unspecified: Secondary | ICD-10-CM | POA: Diagnosis not present

## 2021-03-09 MED ORDER — LORAZEPAM 0.5 MG PO TABS: 0.5000 mg | ORAL_TABLET | Freq: Every day | ORAL | 0 refills | Status: DC | PRN

## 2021-03-09 MED ORDER — ESCITALOPRAM OXALATE 20 MG PO TABS: ORAL_TABLET | ORAL | 2 refills | Status: DC

## 2021-03-09 NOTE — Progress Notes (Signed)
Virtual Visit via Telephone Note  I connected with [name] on 03/09/21 at  2:40 PM EDT by telephone and verified that I am speaking with the correct person using two identifiers.  Location: Patient: Home in [place] Provider: Office   I discussed the limitations, risks, security and privacy concerns of performing an evaluation and management service by telephone and the availability of in person appointments. I also discussed with the patient that there may be a patient responsible charge related to this service. The patient expressed understanding and agreed to proceed.   History of Present Illness: Patient is evaluated by phone session.  He is currently in [place].  He admitted lately feeling more anxious and depressed.  He is taking Ativan again because he was nervous and anxious and afraid.  He also reported feeling tired during the day and has no energy and motivation to do things.  He has not played golf.  Though he does listen to the poetry but has not spent time writing the poetry.  He is sleeping okay.  He denies any crying spells but sometimes he feels hopeless and worthless.  He has a lot of negative and ruminative thoughts but no suicidal thoughts or plan to act on it.  Like to go back to [place].  His PHQ is 10.  He had a good response with [facility].  He is taking Lexapro and reported no tremors, shakes or any EPS.  His appetite is okay and his weight is somewhat better from the past since he is eating back to normal.  He denies any hallucination, delusions.  He lives with his wife who is very supportive.   Past Psychiatric History:Reviewed. H/Odepression and anxiety most of his life. Had a good response with TMS andLexapro. Tried Celexa, Pristiq and Cymbalta but limited outcome. PCP tried Klonopin but made him groggy.Noh/osuicidal attempt, mania, psychosis or self abusive behavior. Saw Dr. Cheryln Manly in the past for therapy.  Psychiatric Specialty Exam: Physical Exam  Review of  Systems  Weight 145 lb (65.8 kg).There is no height or weight on file to calculate BMI.  General Appearance: NA  Eye Contact:  NA  Speech:  Clear and Coherent and Normal Rate  Volume:  Normal  Mood:  Anxious  Affect:  NA  Thought Process:  Goal Directed  Orientation:  Full (Time, Place, and Person)  Thought Content:  Rumination and guilt and negative thoughts  Suicidal Thoughts:  No  Homicidal Thoughts:  No  Memory:  Immediate;   Good Recent;   Good Remote;   Good  Judgement:  Intact  Insight:  Present  Psychomotor Activity:  NA  Concentration:  Concentration: Fair and Attention Span: Fair  Recall:  Good  Fund of Knowledge:  Good  Language:  Fair  Akathisia:  No  Handed:  Right  AIMS (if indicated):     Assets:  Communication Skills Desire for Improvement Housing Social Support Transportation  ADL's:  Intact  Cognition:  WNL  Sleep:   ok      Assessment and Plan: Major depressive disorder, recurrent.  Anxiety.  Patient has been feeling sad depressed and anxious.  Despite taking his medication he still feels hopeless and having ruminative and negative thoughts.  He had a good response with [facility] and he like to go back to [place].  I agree with the plan.  We will referred her to [facility] coordinator for evaluation.  He will continue Lexapro 30 mg daily and lorazepam 0.5 mg as needed.  Discussed medication side effects and benefits.  Recommended to call us back if he has any question or any concern.  Follow-up in 3 months.    Follow Up Instructions:    I discussed the assessment and treatment plan with the patient. The patient was provided an opportunity to ask questions and all were answered. The patient agreed with the plan and demonstrated an understanding of the instructions.   The patient was advised to call back or seek an in-person evaluation if the symptoms worsen or if the condition fails to improve as anticipated.  I provided 18 minutes of non-face-to-face time during  this encounter.   Kathlee Nations, MD

## 2021-03-15 ENCOUNTER — Telehealth (HOSPITAL_COMMUNITY): Payer: Self-pay

## 2021-03-15 NOTE — Telephone Encounter (Signed)
Pt was called to discuss restarting [facility]. [facility] coordinator will submit pre authorization to insurance and schedule patient once authorization is approved.

## 2021-03-19 ENCOUNTER — Other Ambulatory Visit: Payer: Self-pay

## 2021-03-19 ENCOUNTER — Ambulatory Visit (INDEPENDENT_AMBULATORY_CARE_PROVIDER_SITE_OTHER): Payer: Medicare Other | Admitting: Family Medicine

## 2021-03-19 ENCOUNTER — Encounter: Payer: Self-pay | Admitting: Family Medicine

## 2021-03-19 VITALS — BP 146/74 | HR 72 | Temp 97.4°F | Ht 67.0 in | Wt 145.0 lb

## 2021-03-19 DIAGNOSIS — I10 Essential (primary) hypertension: Secondary | ICD-10-CM | POA: Diagnosis not present

## 2021-03-19 DIAGNOSIS — L300 Nummular dermatitis: Secondary | ICD-10-CM

## 2021-03-19 NOTE — Patient Instructions (Addendum)
Nummular Eczema Nummular eczema, also called nummular dermatitis or discoid eczema, is a common skin condition that causes itchy, red, circular, crusted (plaque) lesions. The itch is severe. It most commonly affects the lower legs and the backs of the hands. Men tend to get their first outbreak between 41 and 83 years of age, and women tend to get their first outbreak during their teen or young adult years. What are the causes? The cause of this condition is not known. It may be related to skin sensitivities to certain things, such as:  Metals, such as nickel and, rarely, mercury.  Formaldehyde.  Antibiotic medicine that is applied to the skin. What increases the risk? You are more likely to develop this condition if:  You have very dry skin.  You live in a place with dry and cold weather.  You have a personal or family history of eczema, asthma, or allergies.  You drink alcohol.  You have poor blood flow (circulation). What are the signs or symptoms? Symptoms most commonly affect the lower legs but may also affect the hands, torso, arms, or feet. Symptoms include:  Groups of tiny red spots.  Blister-like sores that leak fluid. These sores may grow together and form circular patches. After a long time, they may become crusty and then scaly.  Well-defined patches of pink, red, or brown skin.  Itchiness and burning, ranging from mild to severe. Itchiness may be worse at night and may cause trouble sleeping. Scratching lesions can cause bleeding. How is this diagnosed? This condition may be diagnosed based on a physical exam and your medical history. You may need a swab test to check for skin infection. This involves swabbing an affected area and testing the sample for bacteria (culture). You may work with a health care provider who specializes in skin conditions (dermatologist). How is this treated? There is no cure for this condition, but treatment can help relieve symptoms.  Depending on how severe your symptoms are, your health care provider may suggest:  Medicine applied to the skin to reduce swelling and irritation (topical corticosteroids).  Medicine taken by mouth to reduce itching (oralantihistamines).  Antibiotic medicine taken orally or applied to your skin (topical antibiotic), if you have a skin infection.  Light therapy (phototherapy). This involves shining ultraviolet (UV) light on the affected skin to reduce itchiness and inflammation.  Soaking in a bath that contains a type of salt that dries out blisters (potassium permanganate soaks). Follow these instructions at home: Medicines  Take or apply over-the-counter and prescription medicines only as told by your health care provider.  If you were prescribed an antibiotic, take or apply it as told by your health care provider. Do not stop using the antibiotic even if you start to feel better. Skin care  Keep your fingernails short to avoid breaking the skin if you scratch.  Wash your hands with mild soap and water for at least 20 seconds to avoid infection.  Pat your skin dry after bathing or washing your hands. Avoid rubbing your skin.  Keep your skin hydrated. To do this: ? Avoid very hot water. Take lukewarm baths or showers. ? Apply moisturizer within 3 minutes of bathing. This locks in moisture. ? Use a humidifier when you have the heating or air conditioning on. This will add moisture to the air.  Identify and avoid things that trigger symptoms or irritate your skin. Triggers may include taking long, hot showers or baths, or not using creams or ointments to  moisturize. Certain soaps may also trigger this condition.   General instructions  Dress in clothes made of cotton or cotton blends. Avoid wearing clothes with wool fabric.  Avoid activities that may cause skin injury. Wear protective clothing when doing outdoor activities, such as gardening or hiking. Cuts, scrapes, and insect bites  can make symptoms worse.  Keep all follow-up visits. This is important. Contact a health care provider if:  You develop a yellowish crust on an area of the affected skin.  You have symptoms that do not go away with treatment or home care methods. Get help right away if:  You have more redness, pain, pus, or swelling. Summary  Nummular eczema is a common disease that causes itchy, red, circular, crusted (plaque) lesions.  The cause of this condition is not known. It may be related to certain skin sensitivities.  Treatments may include taking or applying medicines to reduce swelling and irritation, avoiding triggers, and keeping your skin hydrated. This information is not intended to replace advice given to you by your health care provider. Make sure you discuss any questions you have with your health care provider. Document Revised: 07/27/2020 Document Reviewed: 07/27/2020 Elsevier Patient Education  2021 [place].   Try to keep daily sodium intake < 2,400 mg  Get back to more consistent aerobic exercise.

## 2021-03-19 NOTE — Progress Notes (Signed)
Established Patient Office Visit  Subjective:  Patient ID: [name], male    DOB: 06/29/1936  Age: 85 y.o. MRN: [id number]  CC:  Chief Complaint  Patient presents with  . Rash    HPI JOB HOLTSCLAW presents for persistent rash mostly lower extremities but occasionally upper extremities as well.  Mostly sparing of the trunk.  He has nummular type lesions which are slightly scaly and slightly pruritic.  He prescribed triamcinolone 0.1% cream which is helped minimally.  Denies any recent change in medications.  He takes amlodipine 5 mg daily for hypertension.  Blood pressure was slightly up last visit and up slightly today as well.  He is currently not exercising regularly.  He does think he might be getting too much sodium as not sure regarding milligrams per day.  Very rare alcohol use.  No recent headaches or dizziness.  No nonsteroidal use.  Past Medical History:  Diagnosis Date  . Anxiety   . BPH (benign prostatic hypertrophy)   . DDD (degenerative disc disease), lumbar 2002  . Depression   . Difficult intubation 02/22/2019   Anterior with limited oral opening  . Essential hypertension 03/10/2019  . GERD (gastroesophageal reflux disease)   . History of colon polyps   . History of hiatal hernia 2012  . Hyperlipemia    Mild  . Indwelling Foley catheter present   . Iron deficiency anemia    IV iron therapy   . Mild hyperlipidemia 02/20/2008   Qualifier: Diagnosis of  By: Leanne Chang MD, Lenardo Westwood    . Pre-diabetes   . Renal cyst 2008   7.6 cm lower pole right renal cyst  . Sleep apnea    slight, no CPAP used    Past Surgical History:  Procedure Laterality Date  . COLONOSCOPY  08/2011  . NASAL SEPTOPLASTY W/ TURBINOPLASTY  08/03/2012   Procedure: NASAL SEPTOPLASTY WITH TURBINATE REDUCTION;  Surgeon: Izora Gala, MD;  Location: [facility];  Service: ENT;  Laterality: Bilateral;  . UPPER GASTROINTESTINAL ENDOSCOPY  08/2011  . XI ROBOTIC ASSISTED SIMPLE PROSTATECTOMY N/A 02/22/2019    Procedure: XI ROBOTIC ASSISTED SIMPLE PROSTATECTOMY;  Surgeon: Alexis Frock, MD;  Location: [facility];  Service: Urology;  Laterality: N/A;    Family History  Problem Relation Age of Onset  . Heart attack Father   . Hypertension Father   . Hypertension Daughter   . Hypertension Brother   . Hypertension Brother   . Hypertension Sister     Social History   Socioeconomic History  . Marital status: Married    Spouse name: Not on file  . Number of children: 2  . Years of education: Not on file  . Highest education level: Not on file  Occupational History  . Not on file  Tobacco Use  . Smoking status: Former Research scientist (life sciences)  . Smokeless tobacco: Never Used  . Tobacco comment: rare use over 50 years   Vaping Use  . Vaping Use: Never used  Substance and Sexual Activity  . Alcohol use: Yes    Alcohol/week: 1.0 standard drink    Types: 1 Shots of liquor per week    Comment: weekly  . Drug use: No  . Sexual activity: Never    Birth control/protection: None  Other Topics Concern  . Not on file  Social History Narrative   Lives in 2 level    Will stay for now    Just moved here approx 1.5 yo   Very nice neighbors  Grand kids  x 5 with a very busy holiday   One son is a Teacher, music and nephew is a Investment banker, operational    Son in [place] moved to [place] and he misses his grand children    As 2 brothers in [place]       Retired from [employer], [employer]       Social Determinants of Health   Financial Resource Strain: [place]   . Difficulty of Paying Living Expenses: Not hard at all  Food Insecurity: No Food Insecurity  . Worried About Charity fundraiser in the Last Year: Never true  . Ran Out of Food in the Last Year: Never true  Transportation Needs: No Transportation Needs  . Lack of Transportation (Medical): No  . Lack of Transportation (Non-Medical): No  Physical Activity: Sufficiently Active  . Days of Exercise per Week: 4 days  . Minutes of Exercise per Session: 40 min  Stress: No Stress  Concern Present  . Feeling of Stress : Not at all  Social Connections: Moderately Isolated  . Frequency of Communication with Friends and Family: More than three times a week  . Frequency of Social Gatherings with Friends and Family: Once a week  . Attends Religious Services: Never  . Active Member of Clubs or Organizations: No  . Attends Archivist Meetings: Never  . Marital Status: Married  Human resources officer Violence: Not At Risk  . Fear of Current or Ex-Partner: No  . Emotionally Abused: No  . Physically Abused: No  . Sexually Abused: No    Outpatient Medications Prior to Visit  Medication Sig Dispense Refill  . acetaminophen (TYLENOL) 500 MG tablet Take 1,000 mg by mouth every 6 (six) hours as needed for moderate pain or headache.    Marland Kitchen amLODipine (NORVASC) 5 MG tablet TAKE 1 TABLET BY MOUTH DAILY 90 tablet 0  . cyanocobalamin 2000 MCG tablet Take 2,000 mcg by mouth daily.    Marland Kitchen escitalopram (LEXAPRO) 20 MG tablet TAKE 1 AND 1/2 TABLETS(30 MG) BY MOUTH DAILY 45 tablet 2  . finasteride (PROSCAR) 5 MG tablet Take 5 mg by mouth daily.    . hydrocortisone 2.5 % cream Apply topically 2 (two) times daily as needed. (Patient taking differently: Apply 1 application topically 2 (two) times daily as needed (rash).) 30 g 1  . LORazepam (ATIVAN) 0.5 MG tablet Take 1 tablet (0.5 mg total) by mouth daily as needed for anxiety. 20 tablet 0  . omeprazole (PRILOSEC) 40 MG capsule TAKE 1 CAPSULE BY MOUTH  DAILY 90 capsule 3  . triamcinolone cream (KENALOG) 0.1 % Apply 1 application topically 2 (two) times daily. 454 g 1   No facility-administered medications prior to visit.    Allergies  Allergen Reactions  . Dexlansoprazole     Other reaction(s): Unknown    ROS Review of Systems  Constitutional: Negative for fatigue.  Eyes: Negative for visual disturbance.  Respiratory: Negative for cough, chest tightness and shortness of breath.   Cardiovascular: Negative for chest pain,  palpitations and leg swelling.  Skin: Positive for rash.  Neurological: Negative for dizziness, syncope, weakness, light-headedness and headaches.      Objective:    Physical Exam Vitals reviewed.  Constitutional:      Appearance: Normal appearance.  Cardiovascular:     Rate and Rhythm: Normal rate and regular rhythm.  Pulmonary:     Effort: Pulmonary effort is normal.     Breath sounds: Normal breath sounds.  Skin:  Findings: Rash present.     Comments: He has a few scattered lesions on his thighs which are approximately 1 cm diameter slightly raised erythematous with scaly surface.  No pustules.  No vesicles.  Neurological:     Mental Status: He is alert.     BP (!) 146/74   Pulse 72   Temp (!) 97.4 F (36.3 C) (Temporal)   Ht 5\' 7"  (1.702 m)   Wt 145 lb (65.8 kg)   SpO2 98%   BMI 22.71 kg/m  Wt Readings from Last 3 Encounters:  03/19/21 145 lb (65.8 kg)  02/17/21 145 lb 6.4 oz (66 kg)  12/04/19 143 lb 11.2 oz (65.2 kg)     There are no preventive care reminders to display for this patient.  There are no preventive care reminders to display for this patient.  Lab Results  Component Value Date   TSH 3.02 02/17/2021   Lab Results  Component Value Date   WBC 5.8 02/17/2021   HGB 12.8 (L) 02/17/2021   HCT 39.6 02/17/2021   MCV 78.3 02/17/2021   PLT 213.0 02/17/2021   Lab Results  Component Value Date   NA 134 (L) 02/17/2021   K 4.6 02/17/2021   CO2 27 02/17/2021   GLUCOSE 101 (H) 02/17/2021   BUN 14 02/17/2021   CREATININE 0.77 02/17/2021   BILITOT 0.6 02/17/2021   ALKPHOS 56 02/17/2021   AST 14 02/17/2021   ALT 15 02/17/2021   PROT 6.6 02/17/2021   ALBUMIN 3.8 02/17/2021   CALCIUM 8.8 02/17/2021   ANIONGAP 7 03/26/2019   GFR 83.35 02/17/2021   Lab Results  Component Value Date   CHOL 194 02/17/2021   Lab Results  Component Value Date   HDL 71.60 02/17/2021   Lab Results  Component Value Date   LDLCALC 112 (H) 02/17/2021   Lab  Results  Component Value Date   TRIG 55.0 02/17/2021   Lab Results  Component Value Date   CHOLHDL 3 02/17/2021   Lab Results  Component Value Date   HGBA1C 6.1 12/12/2019      Assessment & Plan:   #1 probable nummular eczema.  Continue steroid cream and we will set up dermatology referral per his request  #2 hypertension.  Slightly elevated reading today.  We discussed options of either increasing amlodipine or adding second agent such as angiotensin receptor blocker or ACE inhibitor and he prefers to give this another couple of months with lifestyle modification.  He plans to start back regular exercise and keeping sodium intake less than 2400 mg daily.  Reassess here in 2 months.  If not further improved at that time consider additional medications as above  No orders of the defined types were placed in this encounter.   Follow-up: Return in about 2 months (around 05/19/2021).    Carolann Littler, MD

## 2021-03-26 ENCOUNTER — Encounter (HOSPITAL_COMMUNITY): Payer: Medicare Other

## 2021-03-26 ENCOUNTER — Other Ambulatory Visit: Payer: Self-pay

## 2021-03-26 ENCOUNTER — Ambulatory Visit (INDEPENDENT_AMBULATORY_CARE_PROVIDER_SITE_OTHER): Payer: Medicare Other | Admitting: Psychiatry

## 2021-03-26 DIAGNOSIS — F332 Major depressive disorder, recurrent severe without psychotic features: Secondary | ICD-10-CM | POA: Diagnosis not present

## 2021-03-26 NOTE — Progress Notes (Signed)
Pt reported to [facility] for cortical mapping and motor threshold determination for Repetitive Transcranial Magnetic Stimulation treatment for Major Depressive Disorder. Pt completed a PHQ-9 with a score of 16 (moderately severe). Pt also completed a Beck's Depression Inventory with a score of 20 (borderline clinical depression). Prior to procedure, pt signed an informed consent agreement for [place] treatment. Pt's treatment area was found by applying single pulses to her left motor cortex, hunting along the anterior/posterior plane and along the superior oblique angle until the best motor response was elicited from the pt's right thumb. The best response was observed at 4.6 cm A/P and 50 degrees SOA, with a coil angle of 20 degrees. Pt's motor threshold was calculated using the [employer]'s proprietary MT Assist algorithm, which produced a calculated motor threshold of 01.18 SMT. Per these findings, pt's treatment parameters are as follows: A/P -- 10.1 cm, SOA -- 50 degrees, Coil Angle --  +20 degrees, Motor Threshold -- 1.19 SMT. With these parameters, the pt will receive 36 sessions of TMS according to the following protocol: 3000 pulses per session, with stimulation in bursts of pulses lasting 4 seconds at a frequency of 10 Hz, separated by 11 seconds of rest. After determining pt's tx parameters, coil was moved to the treatment location, and the first burst of pulses was applied at a reduced power of 80% MT. Pt reported no complaints, and stated that the stimulation was tolerable. Upon completion of mapping, pt completed a few treatment intervals for observation of side effects. Pt tolerated tx well. Pt departed from clinic without issue.

## 2021-03-30 ENCOUNTER — Other Ambulatory Visit: Payer: Self-pay

## 2021-03-30 ENCOUNTER — Other Ambulatory Visit (HOSPITAL_COMMUNITY): Payer: Medicare Other | Attending: Psychiatry

## 2021-03-30 DIAGNOSIS — F332 Major depressive disorder, recurrent severe without psychotic features: Secondary | ICD-10-CM

## 2021-03-30 NOTE — Progress Notes (Signed)
Patient reported to [facility] for Repetitive Transcranial Magnetic Stimulation treatment for severe episode of recurrent major depressive disorder, without psychotic features. Patient presented with appropriate affect, level mood and denied any suicidal or homicidal ideations. Patient denies any other current symptoms and remains optimistic with continued [place] treatment. Patient reported no change in alcohol/substance use, caffeine consumption, sleep pattern or metal implant status since previous tx. Power was titrated to 90% for the duration of tx and will remain there until patient gets adjusted. Patient reported no complaints or discomfort. Patient departed post-treatment with no concerns or complaints.

## 2021-03-31 ENCOUNTER — Other Ambulatory Visit (HOSPITAL_COMMUNITY): Payer: Medicare Other | Attending: Psychiatry

## 2021-03-31 ENCOUNTER — Other Ambulatory Visit: Payer: Self-pay

## 2021-03-31 ENCOUNTER — Telehealth (HOSPITAL_COMMUNITY): Payer: Self-pay | Admitting: *Deleted

## 2021-03-31 DIAGNOSIS — F419 Anxiety disorder, unspecified: Secondary | ICD-10-CM

## 2021-03-31 DIAGNOSIS — F332 Major depressive disorder, recurrent severe without psychotic features: Secondary | ICD-10-CM

## 2021-03-31 NOTE — Telephone Encounter (Signed)
Writer spoke with pt regarding Lorazepam refill. Pt verifies that he has "finifshed the medication" that was prescribed on 03/09/21.

## 2021-03-31 NOTE — Progress Notes (Signed)
Patient reported to [facility] for Repetitive Transcranial Magnetic Stimulation treatment for severe episode of recurrent major depressive disorder, without psychotic features. Patient presented with appropriate affect, level mood and denied any suicidal or homicidal ideations. Patient denies any other current symptoms and remains optimistic with continued [place] treatment. Patient reported no change in alcohol/substance use, caffeine consumption, sleep pattern or metal implant status since previous tx. Power was titrated to 90% for the duration of tx and will remain there until patient gets adjusted. Patient reported no complaints or discomfort. Patient departed post-treatment with no concerns or complaints. Pt states that he was unable to go to the gym today because he was not feeling good due to his depression.

## 2021-04-01 ENCOUNTER — Other Ambulatory Visit: Payer: Self-pay

## 2021-04-01 ENCOUNTER — Other Ambulatory Visit (HOSPITAL_COMMUNITY): Payer: Medicare Other

## 2021-04-01 DIAGNOSIS — L309 Dermatitis, unspecified: Secondary | ICD-10-CM | POA: Diagnosis not present

## 2021-04-01 DIAGNOSIS — F332 Major depressive disorder, recurrent severe without psychotic features: Secondary | ICD-10-CM

## 2021-04-01 MED ORDER — LORAZEPAM 0.5 MG PO TABS: 0.5000 mg | ORAL_TABLET | Freq: Every day | ORAL | 1 refills | Status: DC | PRN

## 2021-04-01 NOTE — Telephone Encounter (Signed)
New presecretion send to [facility]. Please call patient. Thanks

## 2021-04-01 NOTE — Progress Notes (Signed)
Patient reported to [facility] for Repetitive Transcranial Magnetic Stimulation treatment for severe episode of recurrent major depressive disorder, without psychotic features. Patient presented with appropriate affect, level mood and denied any suicidal or homicidal ideations. Patient denies any other current symptoms and remains optimistic with continued [place] treatment. Patient reported no change in alcohol/substance use, caffeine consumption, sleep pattern or metal implant status since previous tx. Power was titrated to90% for the duration of tx and will remain there until patient gets adjusted. Patient reported no complaints or discomfort. Patient departed post-treatment with no concerns or complaints.

## 2021-04-02 ENCOUNTER — Other Ambulatory Visit (INDEPENDENT_AMBULATORY_CARE_PROVIDER_SITE_OTHER): Payer: Medicare Other

## 2021-04-02 ENCOUNTER — Other Ambulatory Visit: Payer: Self-pay

## 2021-04-02 DIAGNOSIS — F332 Major depressive disorder, recurrent severe without psychotic features: Secondary | ICD-10-CM | POA: Diagnosis not present

## 2021-04-02 NOTE — Progress Notes (Signed)
Patient reported to [facility] for Repetitive Transcranial Magnetic Stimulation treatment for severe episode of recurrent major depressive disorder, without psychotic features. Patient presented with appropriate affect, level mood and denied any suicidal or homicidal ideations. Patient denies any other current symptoms and remains optimistic with continued [place] treatment. Patient reported no change in alcohol/substance use, caffeine consumption, sleep pattern or metal implant status since previous tx. Power was titrated to120% for the duration of tx and will remain there until patient gets adjusted. Patient reported no complaints or discomfort. Patient departed post-treatment with no concerns or complaints.

## 2021-04-05 ENCOUNTER — Other Ambulatory Visit (INDEPENDENT_AMBULATORY_CARE_PROVIDER_SITE_OTHER): Payer: Medicare Other

## 2021-04-05 ENCOUNTER — Other Ambulatory Visit: Payer: Self-pay

## 2021-04-05 DIAGNOSIS — F332 Major depressive disorder, recurrent severe without psychotic features: Secondary | ICD-10-CM

## 2021-04-05 NOTE — Progress Notes (Signed)
Patient reported to [facility] for Repetitive Transcranial Magnetic Stimulation treatment for severe episode of recurrent major depressive disorder, without psychotic features. Patient presented with appropriate affect, level mood and denied any suicidal or homicidal ideations. Patient denies any other current symptoms and remains optimistic with continued [place] treatment. Patient reported no change in alcohol/substance use, caffeine consumption, sleep pattern or metal implant status since previous tx. Power was titrated to120% for the duration of tx and will remain there until patient gets adjusted. Patient reported no complaints or discomfort. Patient departed post-treatment with no concerns or complaints.

## 2021-04-06 ENCOUNTER — Other Ambulatory Visit: Payer: Self-pay

## 2021-04-06 ENCOUNTER — Other Ambulatory Visit (INDEPENDENT_AMBULATORY_CARE_PROVIDER_SITE_OTHER): Payer: Medicare Other

## 2021-04-06 DIAGNOSIS — F332 Major depressive disorder, recurrent severe without psychotic features: Secondary | ICD-10-CM

## 2021-04-06 NOTE — Progress Notes (Signed)
Patient reported to [facility] for Repetitive Transcranial Magnetic Stimulation treatment for severe episode of recurrent major depressive disorder, without psychotic features. Patient presented with appropriate affect, level mood and denied any suicidal or homicidal ideations. Patient denies any other current symptoms and remains optimistic with continued [place] treatment. Patient reported no change in alcohol/substance use, caffeine consumption, sleep pattern or metal implant status since previous tx. Power was titrated to120% for the duration of tx and will remain there until patient gets adjusted. Patient reported no complaints or discomfort. Patient departed post-treatment with no concerns or complaints.

## 2021-04-07 ENCOUNTER — Other Ambulatory Visit (INDEPENDENT_AMBULATORY_CARE_PROVIDER_SITE_OTHER): Payer: Medicare Other

## 2021-04-07 ENCOUNTER — Other Ambulatory Visit: Payer: Self-pay

## 2021-04-07 DIAGNOSIS — F332 Major depressive disorder, recurrent severe without psychotic features: Secondary | ICD-10-CM | POA: Diagnosis not present

## 2021-04-07 NOTE — Progress Notes (Signed)
Patient reported to [facility] for Repetitive Transcranial Magnetic Stimulation treatment for severe episode of recurrent major depressive disorder, without psychotic features. Patient presented with appropriate affect, level mood and denied any suicidal or homicidal ideations. Patient denies any other current symptoms and remains optimistic with continued [place] treatment. Patient reported no change in alcohol/substance use, caffeine consumption, sleep pattern or metal implant status since previous tx. Power was titrated to120% for the duration of tx and will remain there until patient gets adjusted. Patient reported no complaints or discomfort. Patient departed post-treatment with no concerns or complaints.

## 2021-04-08 ENCOUNTER — Other Ambulatory Visit (INDEPENDENT_AMBULATORY_CARE_PROVIDER_SITE_OTHER): Payer: Medicare Other

## 2021-04-08 ENCOUNTER — Other Ambulatory Visit: Payer: Self-pay

## 2021-04-08 DIAGNOSIS — F332 Major depressive disorder, recurrent severe without psychotic features: Secondary | ICD-10-CM

## 2021-04-08 NOTE — Progress Notes (Signed)
Patient reported to [facility] for Repetitive Transcranial Magnetic Stimulation treatment for severe episode of recurrent major depressive disorder, without psychotic features. Patient presented with appropriate affect, level mood and denied any suicidal or homicidal ideations. Patient denies any other current symptoms and remains optimistic with continued [place] treatment. Patient reported no change in alcohol/substance use, caffeine consumption, sleep pattern or metal implant status since previous tx. Power was titrated to 120% for the duration of tx and will remain there until patient gets adjusted. Patient reported no complaints or discomfort. Patient departed post-treatment with no concerns or complaints.

## 2021-04-09 ENCOUNTER — Other Ambulatory Visit (INDEPENDENT_AMBULATORY_CARE_PROVIDER_SITE_OTHER): Payer: Medicare Other

## 2021-04-09 DIAGNOSIS — F332 Major depressive disorder, recurrent severe without psychotic features: Secondary | ICD-10-CM

## 2021-04-09 NOTE — Progress Notes (Signed)
Patient reported to [facility] for Repetitive Transcranial Magnetic Stimulation treatment for severe episode of recurrent major depressive disorder, without psychotic features. Patient presented with appropriate affect, level mood and denied any suicidal or homicidal ideations. Patient denies any other current symptoms and remains optimistic with continued [place] treatment. Patient reported no change in alcohol/substance use, caffeine consumption, sleep pattern or metal implant status since previous tx. Power was titrated to 120% for the duration of tx and will remain there until patient gets adjusted. Patient reported no complaints or discomfort. Patient departed post-treatment with no concerns or complaints. Pt completed a PHQ-9 with a score of 8.

## 2021-04-12 ENCOUNTER — Other Ambulatory Visit: Payer: Self-pay

## 2021-04-12 ENCOUNTER — Other Ambulatory Visit (INDEPENDENT_AMBULATORY_CARE_PROVIDER_SITE_OTHER): Payer: Medicare Other

## 2021-04-12 DIAGNOSIS — F332 Major depressive disorder, recurrent severe without psychotic features: Secondary | ICD-10-CM

## 2021-04-12 NOTE — Progress Notes (Addendum)
Patient reported to [facility] for Repetitive Transcranial Magnetic Stimulation treatment for severe episode of recurrent major depressive disorder, without psychotic features. Patient presented with appropriate affect, level mood and denied any suicidal or homicidal ideations. Patient denies any other current symptoms and remains optimistic with continued [place] treatment. Patient reported no change in alcohol/substance use, caffeine consumption, sleep pattern or metal implant status since previous tx. Power was titrated to 120% for the duration of tx and will remain there until patient gets adjusted. Patient reported no complaints or discomfort. Patient departed post-treatment with no concerns or complaints.

## 2021-04-13 ENCOUNTER — Other Ambulatory Visit (HOSPITAL_COMMUNITY): Payer: Medicare Other

## 2021-04-13 DIAGNOSIS — F332 Major depressive disorder, recurrent severe without psychotic features: Secondary | ICD-10-CM

## 2021-04-13 NOTE — Progress Notes (Signed)
Patient reported to [facility] for Repetitive Transcranial Magnetic Stimulation treatment for severe episode of recurrent major depressive disorder, without psychotic features. Patient presented with appropriate affect, level mood and denied any suicidal or homicidal ideations. Patient denies any other current symptoms and remains optimistic with continued [place] treatment. Patient reported no change in alcohol/substance use, caffeine consumption, sleep pattern or metal implant status since previous tx. Power was titrated to 120% for the duration of tx and will remain there until patient gets adjusted. Patient reported no complaints or discomfort. Patient departed post-treatment with no concerns or complaints.

## 2021-04-14 ENCOUNTER — Other Ambulatory Visit (INDEPENDENT_AMBULATORY_CARE_PROVIDER_SITE_OTHER): Payer: Medicare Other

## 2021-04-14 ENCOUNTER — Other Ambulatory Visit: Payer: Self-pay

## 2021-04-14 DIAGNOSIS — F332 Major depressive disorder, recurrent severe without psychotic features: Secondary | ICD-10-CM | POA: Diagnosis not present

## 2021-04-14 NOTE — Progress Notes (Signed)
Patient reported to [facility] for Repetitive Transcranial Magnetic Stimulation treatment for severe episode of recurrent major depressive disorder, without psychotic features. Patient presented with appropriate affect, level mood and denied any suicidal or homicidal ideations. Patient denies any other current symptoms and remains optimistic with continued [place] treatment. Patient reported no change in alcohol/substance use, caffeine consumption, sleep pattern or metal implant status since previous tx. Power was titrated to 120% for the duration of tx and will remain there until patient gets adjusted. Pt states that he feels a slight improvement since starting [place] therapy, but is not having as good of a response has he had previously. Pt states that he is feeling better in the afternoons but is still having a lot of trouble in the mornings. Pt wakes up feeling depressed and anxious. Pt's wife states she has not seen a difference since starting this round of [place].  Patient reported no complaints or discomfort. Patient departed post-treatment with no concerns or complaints.

## 2021-04-15 ENCOUNTER — Other Ambulatory Visit (INDEPENDENT_AMBULATORY_CARE_PROVIDER_SITE_OTHER): Payer: Medicare Other

## 2021-04-15 DIAGNOSIS — F332 Major depressive disorder, recurrent severe without psychotic features: Secondary | ICD-10-CM | POA: Diagnosis not present

## 2021-04-15 NOTE — Progress Notes (Signed)
Patient reported to [facility] for Repetitive Transcranial Magnetic Stimulation treatment for severe episode of recurrent major depressive disorder, without psychotic features. Patient presented with appropriate affect, level mood and denied any suicidal or homicidal ideations. Patient denies any other current symptoms and remains optimistic with continued [place] treatment. Patient reported no change in alcohol/substance use, caffeine consumption, sleep pattern or metal implant status since previous tx. Power was titrated to 120% for the duration of tx and will remain there until patient gets adjusted. When asked about his day pt stated it's not going so well. Pt said he is feeling down and their is nothing in particular affecting his mood. Patient reported no complaints or discomfort. Patient departed post-treatment with no concerns or complaints.

## 2021-04-16 ENCOUNTER — Other Ambulatory Visit: Payer: Self-pay

## 2021-04-16 ENCOUNTER — Other Ambulatory Visit (INDEPENDENT_AMBULATORY_CARE_PROVIDER_SITE_OTHER): Payer: Medicare Other

## 2021-04-16 DIAGNOSIS — F332 Major depressive disorder, recurrent severe without psychotic features: Secondary | ICD-10-CM | POA: Diagnosis not present

## 2021-04-16 NOTE — Progress Notes (Signed)
Patient reported to [facility] for Repetitive Transcranial Magnetic Stimulation treatment for severe episode of recurrent major depressive disorder, without psychotic features. Patient presented with appropriate affect, level mood and denied any suicidal or homicidal ideations. Patient denies any other current symptoms and remains optimistic with continued [place] treatment. Patient reported no change in alcohol/substance use, caffeine consumption, sleep pattern or metal implant status since previous tx. Power was titrated to 120% for the duration of tx and will remain there until patient gets adjusted. Patient reported no complaints or discomfort. Patient departed post-treatment with no concerns or complaints. Pt completed a PHQ-9 with a score of 4.

## 2021-04-19 ENCOUNTER — Other Ambulatory Visit: Payer: Self-pay

## 2021-04-19 ENCOUNTER — Other Ambulatory Visit (INDEPENDENT_AMBULATORY_CARE_PROVIDER_SITE_OTHER): Payer: Medicare Other

## 2021-04-19 DIAGNOSIS — F332 Major depressive disorder, recurrent severe without psychotic features: Secondary | ICD-10-CM

## 2021-04-19 NOTE — Progress Notes (Signed)
Patient reported to [facility] for Repetitive Transcranial Magnetic Stimulation treatment for severe episode of recurrent major depressive disorder, without psychotic features. Patient presented with appropriate affect, level mood and denied any suicidal or homicidal ideations. Patient denies any other current symptoms and remains optimistic with continued [place] treatment. Patient reported no change in alcohol/substance use, caffeine consumption, sleep pattern or metal implant status since previous tx. Power was titrated to 120% for the duration of tx and will remain there until patient gets adjusted. Patient reported no complaints or discomfort. Patient departed post-treatment with no concerns or complaints.

## 2021-04-20 ENCOUNTER — Encounter (HOSPITAL_COMMUNITY): Payer: Self-pay | Admitting: Psychiatry

## 2021-04-20 ENCOUNTER — Other Ambulatory Visit (INDEPENDENT_AMBULATORY_CARE_PROVIDER_SITE_OTHER): Payer: Medicare Other

## 2021-04-20 ENCOUNTER — Telehealth (INDEPENDENT_AMBULATORY_CARE_PROVIDER_SITE_OTHER): Payer: Medicare Other | Admitting: Psychiatry

## 2021-04-20 DIAGNOSIS — F332 Major depressive disorder, recurrent severe without psychotic features: Secondary | ICD-10-CM | POA: Diagnosis not present

## 2021-04-20 DIAGNOSIS — F419 Anxiety disorder, unspecified: Secondary | ICD-10-CM | POA: Diagnosis not present

## 2021-04-20 NOTE — Progress Notes (Signed)
Patient reported to [facility] for Repetitive Transcranial Magnetic Stimulation treatment for severe episode of recurrent major depressive disorder, without psychotic features. Patient presented with appropriate affect, level mood and denied any suicidal or homicidal ideations. Patient denies any other current symptoms and remains optimistic with continued [place] treatment. Patient reported no change in alcohol/substance use, caffeine consumption, sleep pattern or metal implant status since previous tx. Power was titrated to 120% for the duration of tx and will remain there until patient gets adjusted. Patient reported no complaints or discomfort. Patient departed post-treatment with no concerns or complaints.

## 2021-04-20 NOTE — Progress Notes (Signed)
Virtual Visit via Telephone Note  I connected with [name] on 04/20/21 at  3:00 PM EDT by telephone and verified that I am speaking with the correct person using two identifiers.  Location: Patient: Home Provider: Home Office   I discussed the limitations, risks, security and privacy concerns of performing an evaluation and management service by telephone and the availability of in person appointments. I also discussed with the patient that there may be a patient responsible charge related to this service. The patient expressed understanding and agreed to proceed.   History of Present Illness: Patient requested an earlier appointment because he was feeling down and sad until last week he noticed improvement with the [place] treatment.  He admitted he was feeling sad, depressed, fatigue mostly in the afternoon but for past few days he is more optimistic and he noticed energy level is improved.  He has a desire to go back to playing golf and he is hoping once he finished the [place] we will start playing.  Today he has 17 treatment and he has few more to go.  He also looking forward to have his grandchild coming from [place] in few weeks and then he is going in July [place].  He is more motivated to do things.  He started talking to his friend about the poetry which he always enjoys.  His PHQ dropped to 3.  His appetite is okay.  He is sleeping good.  Sometimes he has a lack of appetite but his wife forced him to eat and he does not mind.  He is taking Ativan almost every day and rarely he needed the second but eventually he would like to come off from Ativan.  He has no tremors shakes or any EPS.  He is taking Lexapro 30 mg.  Past Psychiatric History: Reviewed. H/O depression and anxiety most of his life.  Had a good response with TMS and Lexapro. Tried Celexa, Pristiq and Cymbalta but limited outcome.  PCP tried Klonopin but made him groggy.  No h/o suicidal attempt, mania, psychosis or self  abusive behavior.  Saw Dr. Cheryln Manly in the past for therapy.    Psychiatric Specialty Exam: Physical Exam  Review of Systems  Weight 144 lb (65.3 kg).There is no height or weight on file to calculate BMI.  General Appearance: NA  Eye Contact:  NA  Speech:  Normal Rate  Volume:  Normal  Mood:  Anxious  Affect:  NA  Thought Process:  Goal Directed  Orientation:  Full (Time, Place, and Person)  Thought Content:  WDL  Suicidal Thoughts:  No  Homicidal Thoughts:  No  Memory:  Immediate;   Good Recent;   Good Remote;   Good  Judgement:  Good  Insight:  Good  Psychomotor Activity:  NA  Concentration:  Concentration: Good and Attention Span: Good  Recall:  Good  Fund of Knowledge:  Good  Language:  Good  Akathisia:  No  Handed:  Right  AIMS (if indicated):     Assets:  Communication Skills Desire for Improvement Housing Resilience Social Support Transportation  ADL's:  Intact  Cognition:  WNL  Sleep:   ok      Assessment and Plan: Major depressive disorder, recurrent.  Anxiety.  Patient was feeling sad and anxious and depressed until a few days ago he noticed improvement and he feels [place] is working and helping his symptoms.  He is still taking lorazepam 0.5 mg but like to come off eventually.  So  far he is tolerating medication and reported no side effects.  I discussed continuing the current treatment and finish the [place] program.  He has refill remaining on Ativan and Lexapro.  He like to keep the appointment in August and he has enough medicine until then.  I recommend to call us back if is any question or any concern.  Follow-up in 6 weeks.  Follow Up Instructions:    I discussed the assessment and treatment plan with the patient. The patient was provided an opportunity to ask questions and all were answered. The patient agreed with the plan and demonstrated an understanding of the instructions.   The patient was advised to call back or seek an in-person evaluation if  the symptoms worsen or if the condition fails to improve as anticipated.  I provided 13 minutes of non-face-to-face time during this encounter.   Kathlee Nations, MD

## 2021-04-21 ENCOUNTER — Other Ambulatory Visit (INDEPENDENT_AMBULATORY_CARE_PROVIDER_SITE_OTHER): Payer: Medicare Other

## 2021-04-21 ENCOUNTER — Other Ambulatory Visit: Payer: Self-pay

## 2021-04-21 DIAGNOSIS — F332 Major depressive disorder, recurrent severe without psychotic features: Secondary | ICD-10-CM

## 2021-04-21 NOTE — Progress Notes (Signed)
Patient reported to [facility] for Repetitive Transcranial Magnetic Stimulation treatment for severe episode of recurrent major depressive disorder, without psychotic features. Patient presented with appropriate affect, level mood and denied any suicidal or homicidal ideations. Patient denies any other current symptoms and remains optimistic with continued [place] treatment. Patient reported no change in alcohol/substance use, caffeine consumption, sleep pattern or metal implant status since previous tx. Power was titrated to 120% for the duration of tx and will remain there until patient gets adjusted. Patient reported no complaints or discomfort. Patient departed post-treatment with no concerns or complaints.

## 2021-04-22 ENCOUNTER — Other Ambulatory Visit (HOSPITAL_COMMUNITY): Payer: Medicare Other

## 2021-04-22 DIAGNOSIS — F332 Major depressive disorder, recurrent severe without psychotic features: Secondary | ICD-10-CM

## 2021-04-22 NOTE — Progress Notes (Signed)
Patient reported to [facility] for Repetitive Transcranial Magnetic Stimulation treatment for severe episode of recurrent major depressive disorder, without psychotic features. Patient presented with appropriate affect, level mood and denied any suicidal or homicidal ideations. Patient denies any other current symptoms and remains optimistic with continued [place] treatment. Patient reported no change in alcohol/substance use, caffeine consumption, sleep pattern or metal implant status since previous tx. Power was titrated to 120% for the duration of tx and will remain there until patient gets adjusted. Patient reported no complaints or discomfort. Patient departed post-treatment with no concerns or complaints.

## 2021-04-23 ENCOUNTER — Other Ambulatory Visit: Payer: Self-pay

## 2021-04-23 ENCOUNTER — Other Ambulatory Visit (INDEPENDENT_AMBULATORY_CARE_PROVIDER_SITE_OTHER): Payer: Medicare Other

## 2021-04-23 DIAGNOSIS — F332 Major depressive disorder, recurrent severe without psychotic features: Secondary | ICD-10-CM | POA: Diagnosis not present

## 2021-04-23 NOTE — Progress Notes (Signed)
Patient reported to [facility] for Repetitive Transcranial Magnetic Stimulation treatment for severe episode of recurrent major depressive disorder, without psychotic features. Patient presented with appropriate affect, level mood and denied any suicidal or homicidal ideations. Patient denies any other current symptoms and remains optimistic with continued [place] treatment. Patient reported no change in alcohol/substance use, caffeine consumption, sleep pattern or metal implant status since previous tx. Power was titrated to 120% for the duration of tx and will remain there until patient gets adjusted. Patient reported no complaints or discomfort. Patient departed post-treatment with no concerns or complaints. Pt completed a PHQ-9 with a score of 3.

## 2021-04-24 ENCOUNTER — Other Ambulatory Visit: Payer: Self-pay | Admitting: Family Medicine

## 2021-04-26 ENCOUNTER — Other Ambulatory Visit: Payer: Self-pay

## 2021-04-26 ENCOUNTER — Other Ambulatory Visit (INDEPENDENT_AMBULATORY_CARE_PROVIDER_SITE_OTHER): Payer: Medicare Other

## 2021-04-26 DIAGNOSIS — F332 Major depressive disorder, recurrent severe without psychotic features: Secondary | ICD-10-CM | POA: Diagnosis not present

## 2021-04-26 NOTE — Progress Notes (Signed)
Patient reported to [facility] for Repetitive Transcranial Magnetic Stimulation treatment for severe episode of recurrent major depressive disorder, without psychotic features. Patient presented with appropriate affect, level mood and denied any suicidal or homicidal ideations. Patient denies any other current symptoms and remains optimistic with continued [place] treatment. Patient reported no change in alcohol/substance use, caffeine consumption, sleep pattern or metal implant status since previous tx. Power was titrated to 120% for the duration of tx and will remain there until patient gets adjusted. Pt spoke to Dr. Dwyane Dee regarding his course of tx along with plans for future visits. Pt was accompanied by his wife. Pt and his wife were informed about the need for regular maintenance TMS every six months to one year based on depressive symptoms. Pt's PHQ-9 scores are decreasing, but pt continues to have anxiety. Anxiety will be monitored and pt will continue taking his medications as prescribed.  Patient reported no complaints or discomfort. Patient departed post-treatment with no concerns or complaints.

## 2021-04-27 ENCOUNTER — Other Ambulatory Visit (INDEPENDENT_AMBULATORY_CARE_PROVIDER_SITE_OTHER): Payer: Medicare Other

## 2021-04-27 DIAGNOSIS — F332 Major depressive disorder, recurrent severe without psychotic features: Secondary | ICD-10-CM

## 2021-04-27 NOTE — Progress Notes (Signed)
Patient reported to [facility] for Repetitive Transcranial Magnetic Stimulation treatment for severe episode of recurrent major depressive disorder, without psychotic features. Patient presented with appropriate affect, level mood and denied any suicidal or homicidal ideations. Patient denies any other current symptoms and remains optimistic with continued [place] treatment. Patient reported no change in alcohol/substance use, caffeine consumption, sleep pattern or metal implant status since previous tx. Power was titrated to 120% for the duration of tx and will remain there until patient gets adjusted. Patient reported no complaints or discomfort. Patient departed post-treatment with no concerns or complaints. Pt completed GAD-7 with a score of 2.

## 2021-04-28 ENCOUNTER — Other Ambulatory Visit: Payer: Self-pay

## 2021-04-28 ENCOUNTER — Other Ambulatory Visit (INDEPENDENT_AMBULATORY_CARE_PROVIDER_SITE_OTHER): Payer: Medicare Other

## 2021-04-28 DIAGNOSIS — F332 Major depressive disorder, recurrent severe without psychotic features: Secondary | ICD-10-CM | POA: Diagnosis not present

## 2021-04-28 NOTE — Progress Notes (Signed)
Patient reported to [facility] for Repetitive Transcranial Magnetic Stimulation treatment for severe episode of recurrent major depressive disorder, without psychotic features. Patient presented with appropriate affect, level mood and denied any suicidal or homicidal ideations. Patient denies any other current symptoms and remains optimistic with continued [place] treatment. Patient reported no change in alcohol/substance use, caffeine consumption, sleep pattern or metal implant status since previous tx. Power was titrated to 120% for the duration of tx and will remain there until patient gets adjusted. Patient reported no complaints or discomfort. Patient departed post-treatment with no concerns or complaints.

## 2021-04-29 ENCOUNTER — Telehealth (HOSPITAL_COMMUNITY): Payer: Self-pay | Admitting: *Deleted

## 2021-04-29 ENCOUNTER — Encounter (HOSPITAL_COMMUNITY): Payer: Medicare Other

## 2021-04-29 NOTE — Telephone Encounter (Signed)
Erroneous encounter

## 2021-04-30 ENCOUNTER — Other Ambulatory Visit (HOSPITAL_COMMUNITY): Payer: Medicare Other | Attending: Psychiatry

## 2021-04-30 ENCOUNTER — Other Ambulatory Visit: Payer: Self-pay

## 2021-04-30 ENCOUNTER — Other Ambulatory Visit: Payer: Self-pay | Admitting: Family Medicine

## 2021-04-30 DIAGNOSIS — F332 Major depressive disorder, recurrent severe without psychotic features: Secondary | ICD-10-CM | POA: Diagnosis not present

## 2021-04-30 NOTE — Progress Notes (Signed)
Patient reported to [facility] for Repetitive Transcranial Magnetic Stimulation treatment for severe episode of recurrent major depressive disorder, without psychotic features. Patient presented with appropriate affect, level mood and denied any suicidal or homicidal ideations. Patient denies any other current symptoms and remains optimistic with continued [place] treatment. Patient reported no change in alcohol/substance use, caffeine consumption, sleep pattern or metal implant status since previous tx. Power was titrated to 120% for the duration of tx and will remain there until patient gets adjusted. Patient reported no complaints or discomfort. Patient departed post-treatment with no concerns or complaints. Pt completed a PHQ-9 with a score of 1.

## 2021-05-04 ENCOUNTER — Other Ambulatory Visit: Payer: Self-pay

## 2021-05-04 ENCOUNTER — Other Ambulatory Visit (INDEPENDENT_AMBULATORY_CARE_PROVIDER_SITE_OTHER): Payer: Medicare Other

## 2021-05-04 DIAGNOSIS — F332 Major depressive disorder, recurrent severe without psychotic features: Secondary | ICD-10-CM | POA: Diagnosis not present

## 2021-05-04 NOTE — Progress Notes (Signed)
Patient reported to [facility] for Repetitive Transcranial Magnetic Stimulation treatment for severe episode of recurrent major depressive disorder, without psychotic features. Patient presented with appropriate affect, level mood and denied any suicidal or homicidal ideations. Patient denies any other current symptoms and remains optimistic with continued [place] treatment. Patient reported no change in alcohol/substance use, caffeine consumption, sleep pattern or metal implant status since previous tx. Power was titrated to 120% for the duration of tx and will remain there until patient gets adjusted. Patient reported no complaints or discomfort. Patient departed post-treatment with no concerns or complaints.

## 2021-05-05 ENCOUNTER — Other Ambulatory Visit (INDEPENDENT_AMBULATORY_CARE_PROVIDER_SITE_OTHER): Payer: Medicare Other

## 2021-05-05 DIAGNOSIS — F332 Major depressive disorder, recurrent severe without psychotic features: Secondary | ICD-10-CM | POA: Diagnosis not present

## 2021-05-05 NOTE — Progress Notes (Signed)
Patient reported to [facility] for Repetitive Transcranial Magnetic Stimulation treatment for severe episode of recurrent major depressive disorder, without psychotic features. Patient presented with appropriate affect, level mood and denied any suicidal or homicidal ideations. Patient denies any other current symptoms and remains optimistic with continued [place] treatment. Patient reported no change in alcohol/substance use, caffeine consumption, sleep pattern or metal implant status since previous tx. Power was titrated to 120% for the duration of tx and will remain there until patient gets adjusted. Patient reported no complaints or discomfort. Patient departed post-treatment with no concerns or complaints.

## 2021-05-06 ENCOUNTER — Other Ambulatory Visit: Payer: Self-pay

## 2021-05-06 ENCOUNTER — Other Ambulatory Visit (INDEPENDENT_AMBULATORY_CARE_PROVIDER_SITE_OTHER): Payer: Medicare Other

## 2021-05-06 DIAGNOSIS — F332 Major depressive disorder, recurrent severe without psychotic features: Secondary | ICD-10-CM

## 2021-05-06 NOTE — Progress Notes (Signed)
Patient reported to [facility] for Repetitive Transcranial Magnetic Stimulation treatment for severe episode of recurrent major depressive disorder, without psychotic features. Patient presented with appropriate affect, level mood and denied any suicidal or homicidal ideations. Patient denies any other current symptoms and remains optimistic with continued [place] treatment. Patient reported no change in alcohol/substance use, caffeine consumption, sleep pattern or metal implant status since previous tx. Power was titrated to 120% for the duration of tx and will remain there until patient gets adjusted. Patient reported no complaints or discomfort. Patient departed post-treatment with no concerns or complaints.

## 2021-05-07 ENCOUNTER — Other Ambulatory Visit (INDEPENDENT_AMBULATORY_CARE_PROVIDER_SITE_OTHER): Payer: Medicare Other | Admitting: *Deleted

## 2021-05-07 DIAGNOSIS — F332 Major depressive disorder, recurrent severe without psychotic features: Secondary | ICD-10-CM | POA: Diagnosis not present

## 2021-05-07 NOTE — Progress Notes (Signed)
Patient reported to [facility] for Repetitive Transcranial Magnetic Stimulation treatment for severe episode of recurrent major depressive disorder, without psychotic features. Patient presented with appropriate affect, level mood and denied any suicidal or homicidal ideations. Patient denies any other current symptoms and remains optimistic with continued [place] treatment. Patient reported no change in alcohol/substance use, caffeine consumption, sleep pattern or metal implant status since previous tx. Power was titrated to 120% for the duration of tx and will remain there until patient gets adjusted. Patient reported no complaints or discomfort. Patient departed post-treatment with no concerns or complaints. Pt scored a 1 on todays PHQ-9.

## 2021-05-19 ENCOUNTER — Other Ambulatory Visit (INDEPENDENT_AMBULATORY_CARE_PROVIDER_SITE_OTHER): Payer: Medicare Other

## 2021-05-19 ENCOUNTER — Other Ambulatory Visit: Payer: Self-pay

## 2021-05-19 DIAGNOSIS — F332 Major depressive disorder, recurrent severe without psychotic features: Secondary | ICD-10-CM

## 2021-05-19 NOTE — Progress Notes (Signed)
Patient reported to [facility] for Repetitive Transcranial Magnetic Stimulation treatment for severe episode of recurrent major depressive disorder, without psychotic features. Patient presented with appropriate affect, level mood and denied any suicidal or homicidal ideations. Patient denies any other current symptoms and remains optimistic with continued [place] treatment. Patient reported no change in alcohol/substance use, caffeine consumption, sleep pattern or metal implant status since previous tx. Power was titrated to 120% for the duration of tx and will remain there until patient gets adjusted. Patient reported no complaints or discomfort. Patient departed post-treatment with no concerns or complaints.

## 2021-05-20 ENCOUNTER — Other Ambulatory Visit (INDEPENDENT_AMBULATORY_CARE_PROVIDER_SITE_OTHER): Payer: Medicare Other

## 2021-05-20 DIAGNOSIS — F332 Major depressive disorder, recurrent severe without psychotic features: Secondary | ICD-10-CM

## 2021-05-20 DIAGNOSIS — L309 Dermatitis, unspecified: Secondary | ICD-10-CM | POA: Diagnosis not present

## 2021-05-20 NOTE — Progress Notes (Signed)
Patient reported to [facility] for Repetitive Transcranial Magnetic Stimulation treatment for severe episode of recurrent major depressive disorder, without psychotic features. Patient presented with appropriate affect, level mood and denied any suicidal or homicidal ideations. Patient denies any other current symptoms and remains optimistic with continued [place] treatment. Patient reported no change in alcohol/substance use, caffeine consumption, sleep pattern or metal implant status since previous tx. Power was titrated to 120% for the duration of tx and will remain there until patient gets adjusted. Patient reported no complaints or discomfort. Patient departed post-treatment with no concerns or complaints.

## 2021-05-21 ENCOUNTER — Other Ambulatory Visit (HOSPITAL_COMMUNITY): Payer: Medicare Other

## 2021-05-21 ENCOUNTER — Other Ambulatory Visit: Payer: Self-pay

## 2021-05-21 DIAGNOSIS — F332 Major depressive disorder, recurrent severe without psychotic features: Secondary | ICD-10-CM

## 2021-05-21 NOTE — Progress Notes (Signed)
Patient reported to [facility] for Repetitive Transcranial Magnetic Stimulation treatment for severe episode of recurrent major depressive disorder, without psychotic features. Patient presented with appropriate affect, level mood and denied any suicidal or homicidal ideations. Patient denies any other current symptoms and remains optimistic with continued [place] treatment. Patient reported no change in alcohol/substance use, caffeine consumption, sleep pattern or metal implant status since previous tx. Power was titrated to 120% for the duration of tx and will remain there until patient gets adjusted. Patient reported no complaints or discomfort. Patient departed post-treatment with no concerns or complaints. Pt completed a PHQ-9 with a score of 0 and a GAD-7 score of 1.

## 2021-05-24 ENCOUNTER — Other Ambulatory Visit (INDEPENDENT_AMBULATORY_CARE_PROVIDER_SITE_OTHER): Payer: Medicare Other

## 2021-05-24 ENCOUNTER — Other Ambulatory Visit: Payer: Self-pay

## 2021-05-24 DIAGNOSIS — F332 Major depressive disorder, recurrent severe without psychotic features: Secondary | ICD-10-CM

## 2021-05-24 NOTE — Progress Notes (Signed)
Patient reported to [facility] for Repetitive Transcranial Magnetic Stimulation treatment for severe episode of recurrent major depressive disorder, without psychotic features. Patient presented with appropriate affect, level mood and denied any suicidal or homicidal ideations. Patient denies any other current symptoms and remains optimistic with continued [place] treatment. Patient reported no change in alcohol/substance use, caffeine consumption, sleep pattern or metal implant status since previous tx. Power was titrated to 120% for the duration of tx and will remain there until patient gets adjusted. Patient reported no complaints or discomfort. Patient departed post-treatment with no concerns or complaints.

## 2021-05-26 ENCOUNTER — Other Ambulatory Visit (INDEPENDENT_AMBULATORY_CARE_PROVIDER_SITE_OTHER): Payer: Medicare Other

## 2021-05-26 ENCOUNTER — Other Ambulatory Visit: Payer: Self-pay

## 2021-05-26 DIAGNOSIS — F332 Major depressive disorder, recurrent severe without psychotic features: Secondary | ICD-10-CM | POA: Diagnosis not present

## 2021-05-26 NOTE — Progress Notes (Signed)
Patient reported to [facility] for Repetitive Transcranial Magnetic Stimulation treatment for severe episode of recurrent major depressive disorder, without psychotic features. Patient presented with appropriate affect, level mood and denied any suicidal or homicidal ideations. Patient denies any other current symptoms and remains optimistic with continued [place] treatment. Patient reported no change in alcohol/substance use, caffeine consumption, sleep pattern or metal implant status since previous tx. Power was titrated to 120% for the duration of tx and will remain there until patient gets adjusted. Patient reported no complaints or discomfort. Patient departed post-treatment with no concerns or complaints.

## 2021-05-28 ENCOUNTER — Other Ambulatory Visit (INDEPENDENT_AMBULATORY_CARE_PROVIDER_SITE_OTHER): Payer: Medicare Other

## 2021-05-28 ENCOUNTER — Other Ambulatory Visit: Payer: Self-pay

## 2021-05-28 DIAGNOSIS — F332 Major depressive disorder, recurrent severe without psychotic features: Secondary | ICD-10-CM | POA: Diagnosis not present

## 2021-05-28 NOTE — Progress Notes (Signed)
Patient reported to [facility] for Repetitive Transcranial Magnetic Stimulation treatment for severe episode of recurrent major depressive disorder, without psychotic features. Patient presented with appropriate affect, level mood and denied any suicidal or homicidal ideations. Patient denies any other current symptoms and remains optimistic with continued [place] treatment. Patient reported no change in alcohol/substance use, caffeine consumption, sleep pattern or metal implant status since previous tx. Power was titrated to 120% for the duration of tx and will remain there until patient gets adjusted. Patient reported no complaints or discomfort. Patient departed post-treatment with no concerns or complaints. Pt completed a PHQ-9 with a score of 0.

## 2021-05-31 ENCOUNTER — Other Ambulatory Visit (HOSPITAL_COMMUNITY): Payer: Medicare Other

## 2021-06-01 ENCOUNTER — Other Ambulatory Visit: Payer: Self-pay

## 2021-06-01 ENCOUNTER — Other Ambulatory Visit (HOSPITAL_COMMUNITY): Payer: Medicare Other | Attending: Psychiatry

## 2021-06-01 DIAGNOSIS — F332 Major depressive disorder, recurrent severe without psychotic features: Secondary | ICD-10-CM | POA: Diagnosis not present

## 2021-06-01 NOTE — Progress Notes (Signed)
Patient reported to [facility] for Repetitive Transcranial Magnetic Stimulation treatment for severe episode of recurrent major depressive disorder, without psychotic features. Patient presented with appropriate affect, level mood and denied any suicidal or homicidal ideations. Patient denies any other current symptoms and remains optimistic with continued [place] treatment. Patient reported no change in alcohol/substance use, caffeine consumption, sleep pattern or metal implant status since previous tx. Power was titrated to 120% for the duration of tx and will remain there until patient gets adjusted. Patient reported no complaints or discomfort. Patient departed post-treatment with no concerns or complaints.

## 2021-06-02 ENCOUNTER — Encounter (HOSPITAL_COMMUNITY): Payer: Medicare Other

## 2021-06-04 ENCOUNTER — Other Ambulatory Visit: Payer: Self-pay

## 2021-06-04 ENCOUNTER — Other Ambulatory Visit (INDEPENDENT_AMBULATORY_CARE_PROVIDER_SITE_OTHER): Payer: Medicare Other

## 2021-06-04 DIAGNOSIS — F332 Major depressive disorder, recurrent severe without psychotic features: Secondary | ICD-10-CM | POA: Diagnosis not present

## 2021-06-04 NOTE — Progress Notes (Signed)
Patient reported to [facility] for Repetitive Transcranial Magnetic Stimulation treatment for severe episode of recurrent major depressive disorder, without psychotic features. Patient presented with appropriate affect, level mood and denied any suicidal or homicidal ideations. Patient denies any other current symptoms and remains optimistic with continued [place] treatment. Patient reported no change in alcohol/substance use, caffeine consumption, sleep pattern or metal implant status since previous tx. Power was titrated to 120% for the duration of tx and will remain there until patient gets adjusted. Patient reported no complaints or discomfort. Patient departed post-treatment with no concerns or complaints. Pt completed a PHQ-9 with a score of 0.

## 2021-06-14 ENCOUNTER — Other Ambulatory Visit (HOSPITAL_COMMUNITY): Payer: Self-pay | Admitting: Psychiatry

## 2021-06-14 DIAGNOSIS — F332 Major depressive disorder, recurrent severe without psychotic features: Secondary | ICD-10-CM

## 2021-06-17 ENCOUNTER — Ambulatory Visit (HOSPITAL_COMMUNITY): Payer: Medicare Other | Admitting: Psychiatry

## 2021-07-21 ENCOUNTER — Encounter: Payer: Self-pay | Admitting: Family Medicine

## 2021-07-21 ENCOUNTER — Ambulatory Visit (INDEPENDENT_AMBULATORY_CARE_PROVIDER_SITE_OTHER): Payer: Medicare Other | Admitting: Family Medicine

## 2021-07-21 ENCOUNTER — Other Ambulatory Visit: Payer: Self-pay

## 2021-07-21 VITALS — BP 144/60 | HR 74 | Temp 98.2°F | Ht 67.0 in | Wt 141.9 lb

## 2021-07-21 DIAGNOSIS — Z23 Encounter for immunization: Secondary | ICD-10-CM | POA: Diagnosis not present

## 2021-07-21 DIAGNOSIS — I10 Essential (primary) hypertension: Secondary | ICD-10-CM

## 2021-07-21 MED ORDER — VALSARTAN 80 MG PO TABS: 80.0000 mg | ORAL_TABLET | Freq: Every day | ORAL | 5 refills | Status: DC

## 2021-07-21 MED ORDER — AMLODIPINE BESYLATE 5 MG PO TABS: 5.0000 mg | ORAL_TABLET | Freq: Every day | ORAL | 3 refills | Status: DC

## 2021-07-21 NOTE — Progress Notes (Signed)
Established Patient Office Visit  Subjective:  Patient ID: [name], male    DOB: Jan 09, 1936  Age: 86 y.o. MRN: [id number]  CC:  Chief Complaint  Patient presents with   Blood pressure Monitoring     HPI [name] presents for concerns for elevated blood pressure.  By home monitoring this is been consistently 140s for several months.  He takes amlodipine 5 mg daily.  Rare alcohol use.  No regular nonsteroidal use.  Does not add a lot of extra salt.  Denies any recent headaches.  No peripheral edema issues.  Has had tendencies toward slightly low sodium in the past and occasional hypokalemia.  Exercising mostly with golf.  Occasional walking.  Past Medical History:  Diagnosis Date   Anxiety    BPH (benign prostatic hypertrophy)    DDD (degenerative disc disease), lumbar 2002   Depression    Difficult intubation 02/22/2019   Anterior with limited oral opening   Essential hypertension 03/10/2019   GERD (gastroesophageal reflux disease)    History of colon polyps    History of hiatal hernia 2012   Hyperlipemia    Mild   Indwelling Foley catheter present    Iron deficiency anemia    IV iron therapy    Mild hyperlipidemia 02/20/2008   Qualifier: Diagnosis of  By: Leanne Chang MD, [place] Klauer     Pre-diabetes    Renal cyst 2008   7.6 cm lower pole right renal cyst   Sleep apnea    slight, no CPAP used    Past Surgical History:  Procedure Laterality Date   COLONOSCOPY  08/2011   NASAL SEPTOPLASTY W/ TURBINOPLASTY  08/03/2012   Procedure: NASAL SEPTOPLASTY WITH TURBINATE REDUCTION;  Surgeon: Izora Gala, MD;  Location: [place];  Service: ENT;  Laterality: Bilateral;   UPPER GASTROINTESTINAL ENDOSCOPY  08/2011   XI ROBOTIC ASSISTED SIMPLE PROSTATECTOMY N/A 02/22/2019   Procedure: XI ROBOTIC ASSISTED SIMPLE PROSTATECTOMY;  Surgeon: Alexis Frock, MD;  Location: [facility];  Service: Urology;  Laterality: N/A;    Family History  Problem Relation Age of Onset   Heart attack Father     Hypertension Father    Hypertension Daughter    Hypertension Brother    Hypertension Brother    Hypertension Sister     Social History   Socioeconomic History   Marital status: Married    Spouse name: Not on file   Number of children: 2   Years of education: Not on file   Highest education level: Not on file  Occupational History   Not on file  Tobacco Use   Smoking status: Former   Smokeless tobacco: Never   Tobacco comments:    rare use over 50 years   Vaping Use   Vaping Use: Never used  Substance and Sexual Activity   Alcohol use: Yes    Alcohol/week: 1.0 standard drink    Types: 1 Shots of liquor per week    Comment: weekly   Drug use: No   Sexual activity: Never    Birth control/protection: None  Other Topics Concern   Not on file  Social History Narrative   Lives in 2 level    Will stay for now    Just moved here approx 1.5 yo   Very nice neighbors    [employer]  x 5 with a very busy holiday   One son is a Teacher, music and nephew is a Investment banker, operational    Son in [place] moved to  [place] and he misses his grand children    As 2 brothers in [place]       Retired from [employer], [employer]       Social Determinants of Radio broadcast assistant Strain: Low Risk    Difficulty of Paying Living Expenses: Not hard at all  Food Insecurity: No Food Insecurity   Worried About Charity fundraiser in the Last Year: Never true   Arboriculturist in the Last Year: Never true  Transportation Needs: No Transportation Needs   Lack of Transportation (Medical): No   Lack of Transportation (Non-Medical): No  Physical Activity: Sufficiently Active   Days of Exercise per Week: 4 days   Minutes of Exercise per Session: 40 min  Stress: No Stress Concern Present   Feeling of Stress : Not at all  Social Connections: Moderately Isolated   Frequency of Communication with Friends and Family: More than three times a week   Frequency of Social Gatherings with Friends and Family: Once a week    Attends Religious Services: Never   Marine scientist or Organizations: No   Attends Music therapist: Never   Marital Status: Married  Human resources officer Violence: Not At Risk   Fear of Current or Ex-Partner: No   Emotionally Abused: No   Physically Abused: No   Sexually Abused: No    Outpatient Medications Prior to Visit  Medication Sig Dispense Refill   acetaminophen (TYLENOL) 500 MG tablet Take 1,000 mg by mouth every 6 (six) hours as needed for moderate pain or headache.     cyanocobalamin 2000 MCG tablet Take 2,000 mcg by mouth daily.     escitalopram (LEXAPRO) 20 MG tablet TAKE 1 AND 1/2 TABLETS(30 MG) BY MOUTH DAILY 45 tablet 0   finasteride (PROSCAR) 5 MG tablet Take 5 mg by mouth daily.     hydrocortisone 2.5 % cream Apply topically 2 (two) times daily as needed. (Patient taking differently: Apply 1 application topically 2 (two) times daily as needed (rash).) 30 g 1   LORazepam (ATIVAN) 0.5 MG tablet Take 1 tablet (0.5 mg total) by mouth daily as needed for anxiety. 30 tablet 1   omeprazole (PRILOSEC) 40 MG capsule TAKE 1 CAPSULE BY MOUTH  DAILY 90 capsule 3   triamcinolone cream (KENALOG) 0.1 % Apply 1 application topically 2 (two) times daily. 454 g 1   amLODipine (NORVASC) 5 MG tablet TAKE 1 TABLET BY MOUTH DAILY 90 tablet 0   No facility-administered medications prior to visit.    Allergies  Allergen Reactions   Dexlansoprazole     Other reaction(s): Unknown    ROS Review of Systems  Constitutional:  Negative for appetite change and unexpected weight change.  Eyes:  Negative for visual disturbance.  Respiratory:  Negative for shortness of breath.   Cardiovascular:  Negative for chest pain.  Neurological:  Negative for headaches.     Objective:    Physical Exam Vitals reviewed.  Constitutional:      Appearance: Normal appearance.  Cardiovascular:     Rate and Rhythm: Normal rate and regular rhythm.  Pulmonary:     Effort: Pulmonary effort  is normal.     Breath sounds: Normal breath sounds.  Musculoskeletal:     Right lower leg: No edema.     Left lower leg: No edema.  Neurological:     Mental Status: He is alert.    BP (!) 144/60 (BP Location: Left Arm, Patient Position: Sitting,  Cuff Size: Normal)   Pulse 74   Temp 98.2 F (36.8 C) (Oral)   Ht 5\' 7"  (1.702 m)   Wt 141 lb 14.4 oz (64.4 kg)   SpO2 98%   BMI 22.22 kg/m  Wt Readings from Last 3 Encounters:  07/21/21 141 lb 14.4 oz (64.4 kg)  03/19/21 145 lb (65.8 kg)  02/17/21 145 lb 6.4 oz (66 kg)     Health Maintenance Due  Topic Date Due   Zoster Vaccines- Shingrix (1 of 2) Never done   COVID-19 Vaccine (4 - Booster for Pfizer series) 12/03/2020   INFLUENZA VACCINE  05/31/2021    There are no preventive care reminders to display for this patient.  Lab Results  Component Value Date   TSH 3.02 02/17/2021   Lab Results  Component Value Date   WBC 5.8 02/17/2021   HGB 12.8 (L) 02/17/2021   HCT 39.6 02/17/2021   MCV 78.3 02/17/2021   PLT 213.0 02/17/2021   Lab Results  Component Value Date   NA 134 (L) 02/17/2021   K 4.6 02/17/2021   CO2 27 02/17/2021   GLUCOSE 101 (H) 02/17/2021   BUN 14 02/17/2021   CREATININE 0.77 02/17/2021   BILITOT 0.6 02/17/2021   ALKPHOS 56 02/17/2021   AST 14 02/17/2021   ALT 15 02/17/2021   PROT 6.6 02/17/2021   ALBUMIN 3.8 02/17/2021   CALCIUM 8.8 02/17/2021   ANIONGAP 7 03/26/2019   GFR 83.35 02/17/2021   Lab Results  Component Value Date   CHOL 194 02/17/2021   Lab Results  Component Value Date   HDL 71.60 02/17/2021   Lab Results  Component Value Date   LDLCALC 112 (H) 02/17/2021   Lab Results  Component Value Date   TRIG 55.0 02/17/2021   Lab Results  Component Value Date   CHOLHDL 3 02/17/2021   Lab Results  Component Value Date   HGBA1C 6.1 12/12/2019      Assessment & Plan:   Hypertension.  Currently suboptimally controlled over several months of monitoring.  -We did discuss  option of increasing amlodipine to 10 mg daily but explained there may be some edema issues with that.  We decided instead to start valsartan 80 mg once daily -Bring back to reassess blood pressure 1 month and check basic metabolic panel then -Continue regular exercise habits -Would avoid thiazides with his tendencies toward hypokalemia and hyponatremia in the past. -Flu vaccine given  Meds ordered this encounter  Medications   valsartan (DIOVAN) 80 MG tablet    Sig: Take 1 tablet (80 mg total) by mouth daily.    Dispense:  30 tablet    Refill:  5   amLODipine (NORVASC) 5 MG tablet    Sig: Take 1 tablet (5 mg total) by mouth daily.    Dispense:  90 tablet    Refill:  3    ZERO refills remain on this prescription. Your patient is requesting advance approval of refills for this medication to [place]    Follow-up: Return in about 1 month (around 08/20/2021).    Carolann Littler, MD

## 2021-07-21 NOTE — Patient Instructions (Signed)
Set up one month follow up.

## 2021-07-22 ENCOUNTER — Ambulatory Visit (HOSPITAL_COMMUNITY): Payer: Medicare Other | Admitting: Psychiatry

## 2021-07-22 ENCOUNTER — Encounter (HOSPITAL_COMMUNITY): Payer: Self-pay | Admitting: Psychiatry

## 2021-07-22 VITALS — BP 161/66 | HR 66 | Ht 67.0 in | Wt 136.0 lb

## 2021-07-22 DIAGNOSIS — F332 Major depressive disorder, recurrent severe without psychotic features: Secondary | ICD-10-CM | POA: Diagnosis not present

## 2021-07-22 DIAGNOSIS — F419 Anxiety disorder, unspecified: Secondary | ICD-10-CM

## 2021-07-22 NOTE — Progress Notes (Signed)
BH MD/PA/NP OP Progress Note  07/22/2021 2:21 PM [name]  MRN:  [id number]  Chief Complaint:  Chief Complaint   Follow-up; [place]    HPI: Patient is an 85 year old male diagnosed with major depressive disorder, recurrent currently in remission and generalized anxiety disorder who presents for a post [place] follow-up visit  Patient reports that he is doing fairly well, no longer requires lorazepam as he is not struggling with anxiety.  Patient states that he continues to take his Lexapro, denies any activating features on the medication.  Patient reports that his mood is good and on a scale of 0-10 with 0 being no symptoms and 10 being the worst, he reports 0 out of 10.  He also rates anxiety on the same scale and reports that he is no longer anxious, feels good and it is a 0 for anxiety also.  Patient has that he is happy that he is responded to [place], plans to monitor his mood and if he starts having depressive symptoms, he will contact the office for [place].  Patient denies any thoughts of self-harm, harm to others, any side effects with medication.  He also reports that he is eating fine and sleeping well Visit Diagnosis:    ICD-10-CM   1. generalized anxiety disorder  F41.9     2. major depression, recurrent, currently in remission  F33.2       Past Psychiatric History: Patient has longstanding history of depression and anxiety, most of his life, he did have a response to [place] in the past and again with [place]  Past Medical History:  Past Medical History:  Diagnosis Date   Anxiety    BPH (benign prostatic hypertrophy)    DDD (degenerative disc disease), lumbar 2002   Depression    Difficult intubation 02/22/2019   Anterior with limited oral opening   Essential hypertension 03/10/2019   GERD (gastroesophageal reflux disease)    History of colon polyps    History of hiatal hernia 2012   Hyperlipemia    Mild   Indwelling Foley catheter present    Iron deficiency anemia    IV iron  therapy    Mild hyperlipidemia 02/20/2008   Qualifier: Diagnosis of  By: Leanne Chang MD, [place]     Pre-diabetes    Renal cyst 2008   7.6 cm lower pole right renal cyst   Sleep apnea    slight, no CPAP used    Past Surgical History:  Procedure Laterality Date   COLONOSCOPY  08/2011   NASAL SEPTOPLASTY W/ TURBINOPLASTY  08/03/2012   Procedure: NASAL SEPTOPLASTY WITH TURBINATE REDUCTION;  Surgeon: Izora Gala, MD;  Location: [facility];  Service: ENT;  Laterality: Bilateral;   UPPER GASTROINTESTINAL ENDOSCOPY  08/2011   XI ROBOTIC ASSISTED SIMPLE PROSTATECTOMY N/A 02/22/2019   Procedure: XI ROBOTIC ASSISTED SIMPLE PROSTATECTOMY;  Surgeon: Alexis Frock, MD;  Location: [facility];  Service: Urology;  Laterality: N/A;    Family Psychiatric History: No family psychiatric history  Family History:  Family History  Problem Relation Age of Onset   Heart attack Father    Hypertension Father    Hypertension Daughter    Hypertension Brother    Hypertension Brother    Hypertension Sister     Social History:  Social History   Socioeconomic History   Marital status: Married    Spouse name: Not on file   Number of children: 2   Years of education: Not on file   Highest education level: Not on  file  Occupational History   Not on file  Tobacco Use   Smoking status: Former   Smokeless tobacco: Never   Tobacco comments:    rare use over 50 years   Vaping Use   Vaping Use: Never used  Substance and Sexual Activity   Alcohol use: Yes    Alcohol/week: 1.0 standard drink    Types: 1 Shots of liquor per week    Comment: weekly   Drug use: No   Sexual activity: Never    Birth control/protection: None  Other Topics Concern   Not on file  Social History Narrative   Lives in 2 level    Will stay for now    Just moved here approx 1.5 yo   Very nice neighbors    [employer]  x 5 with a very busy holiday   One son is a Teacher, music and nephew is a Investment banker, operational    Son in [place] moved to [place] and  he misses his grand children    As 2 brothers in [place]       Retired from [employer], [employer]       Social Determinants of Radio broadcast assistant Strain: Low Risk    Difficulty of Paying Living Expenses: Not hard at all  Food Insecurity: No Food Insecurity   Worried About Charity fundraiser in the Last Year: Never true   Arboriculturist in the Last Year: Never true  Transportation Needs: No Transportation Needs   Lack of Transportation (Medical): No   Lack of Transportation (Non-Medical): No  Physical Activity: Sufficiently Active   Days of Exercise per Week: 4 days   Minutes of Exercise per Session: 40 min  Stress: No Stress Concern Present   Feeling of Stress : Not at all  Social Connections: Moderately Isolated   Frequency of Communication with Friends and Family: More than three times a week   Frequency of Social Gatherings with Friends and Family: Once a week   Attends Religious Services: Never   Marine scientist or Organizations: No   Attends Archivist Meetings: Never   Marital Status: Married    Allergies:  Allergies  Allergen Reactions   Dexlansoprazole     Other reaction(s): Unknown    Metabolic Disorder Labs: Lab Results  Component Value Date   HGBA1C 6.1 12/12/2019   MPG 116.89 02/20/2019   No results found for: PROLACTIN Lab Results  Component Value Date   CHOL 194 02/17/2021   TRIG 55.0 02/17/2021   HDL 71.60 02/17/2021   CHOLHDL 3 02/17/2021   VLDL 11.0 02/17/2021   LDLCALC 112 (H) 02/17/2021   LDLCALC 115 (H) 12/12/2019   Lab Results  Component Value Date   TSH 3.02 02/17/2021   TSH 3.28 12/12/2019    Therapeutic Level Labs: No results found for: LITHIUM No results found for: VALPROATE No components found for:  CBMZ  Current Medications: Current Outpatient Medications  Medication Sig Dispense Refill   acetaminophen (TYLENOL) 500 MG tablet Take 1,000 mg by mouth every 6 (six) hours as needed for moderate pain or headache.      amLODipine (NORVASC) 5 MG tablet Take 1 tablet (5 mg total) by mouth daily. 90 tablet 3   cyanocobalamin 2000 MCG tablet Take 2,000 mcg by mouth daily.     escitalopram (LEXAPRO) 20 MG tablet TAKE 1 AND 1/2 TABLETS(30 MG) BY MOUTH DAILY 45 tablet 0   finasteride (PROSCAR) 5 MG tablet Take  5 mg by mouth daily.     hydrocortisone 2.5 % cream Apply topically 2 (two) times daily as needed. (Patient taking differently: Apply 1 application topically 2 (two) times daily as needed (rash).) 30 g 1   LORazepam (ATIVAN) 0.5 MG tablet Take 1 tablet (0.5 mg total) by mouth daily as needed for anxiety. 30 tablet 1   omeprazole (PRILOSEC) 40 MG capsule TAKE 1 CAPSULE BY MOUTH  DAILY 90 capsule 3   triamcinolone cream (KENALOG) 0.1 % Apply 1 application topically 2 (two) times daily. 454 g 1   valsartan (DIOVAN) 80 MG tablet Take 1 tablet (80 mg total) by mouth daily. 30 tablet 5   No current facility-administered medications for this visit.     Musculoskeletal: Strength & Muscle Tone: within normal limits Gait & Station: normal Patient leans: N/A  Psychiatric Specialty Exam: Review of Systems  Constitutional: Negative.  Negative for activity change, fatigue and unexpected weight change.  HENT: Negative.  Negative for congestion, sneezing and sore throat.   Eyes:  Negative for photophobia, discharge and redness.  Respiratory: Negative.  Negative for cough, shortness of breath and wheezing.   Cardiovascular: Negative.  Negative for chest pain and palpitations.  Gastrointestinal:  Negative for diarrhea, nausea and vomiting.  Musculoskeletal:  Negative for myalgias.  Neurological: Negative.  Negative for dizziness, tremors, seizures, speech difficulty, weakness and light-headedness.  Psychiatric/Behavioral: Negative.  Negative for agitation, behavioral problems, confusion, decreased concentration, dysphoric mood, hallucinations, self-injury, sleep disturbance and suicidal ideas. The patient is not  nervous/anxious and is not hyperactive.    There were no vitals taken for this visit.There is no height or weight on file to calculate BMI.  General Appearance: Casual  Eye Contact:  Fair  Speech:  Clear and Coherent and Normal Rate  Volume:  Normal  Mood:  Euthymic  Affect:  Congruent and Full Range  Thought Process:  Goal Directed and Descriptions of Associations: Intact  Orientation:  Full (Time, Place, and Person)  Thought Content: WDL   Suicidal Thoughts:  No  Homicidal Thoughts:  No  Memory:  Immediate;   Fair Recent;   Fair Remote;   Fair  Judgement:  Intact  Insight:  Present  Psychomotor Activity:  Normal  Concentration:  Concentration: Fair and Attention Span: Fair  Recall:  [employer] of Knowledge: Fair  Language: Fair  Akathisia:  No  Handed:  Right  AIMS (if indicated): not done  Assets:  Communication Skills Desire for Improvement Financial Resources/Insurance Housing Intimacy Leisure Time Social Support Talents/Skills Transportation  ADL's:  Intact  Cognition: WNL  Sleep:  Fair   Screenings: AUDIT    Flowsheet Row Clinical Support from 12/14/2020 in [facility]  Alcohol Use Disorder Identification Test Final Score (AUDIT) 3      GAD-7    Flowsheet Row Clinical Support from 05/21/2021 in [facility] from 04/27/2021 in [facility]  Total GAD-7 Score 1 2      PHQ2-9    [place] from 06/04/2021 in [facility] from 05/21/2021 in [facility] from 05/07/2021 in [facility] from 04/30/2021 in [facility] from 04/23/2021 in [facility]  PHQ-2 Total Score 0 0 _0 PHQ-9 Total Score 0 0 _1 Flowsheet Row Video Visit from 04/20/2021 in [facility] Video Visit from 03/09/2021 in  [facility] Video Visit from 12/10/2020 in [facility] Error: Question 6 not populated Error: Q3, 4, or 5 should not be populated when Q2 is No No Risk        Assessment and Plan: Patient was seen for a follow-up visit after completing [place] on 06/04/2021.  Patient seems to be doing fairly well, is no longer requiring benzodiazepine for anxiety though he does have it prescribed as as needed.  Patient also reports that he is doing very well with his depression, currently in remission and that he continues to take his Lexapro and will follow up with his outpatient psychiatrist Dr. Adele Schilder in a few months. Discussed with patient his blood pressure, patient reported that he had met with his primary care physician, and that his primary care physician was going to change his blood pressure medication.  Discussed salt restriction, regular exercise.  Patient reports that he has been walking regularly, and is now also playing golf something he has not done in a while  50% of this visit was spent educating patient about [place], the need to continue to monitor if he has any depressive symptoms, the need to continue his Lexapro and keep regular follow-up appointments.  This was a 20-minute visit   Hampton Abbot, MD 07/22/2021, 2:21 PM

## 2021-08-19 ENCOUNTER — Telehealth (HOSPITAL_COMMUNITY): Payer: Self-pay

## 2021-08-19 DIAGNOSIS — F419 Anxiety disorder, unspecified: Secondary | ICD-10-CM

## 2021-08-19 DIAGNOSIS — F332 Major depressive disorder, recurrent severe without psychotic features: Secondary | ICD-10-CM

## 2021-08-19 MED ORDER — LORAZEPAM 0.5 MG PO TABS: 0.5000 mg | ORAL_TABLET | Freq: Every day | ORAL | 0 refills | Status: DC | PRN

## 2021-08-19 MED ORDER — ESCITALOPRAM OXALATE 20 MG PO TABS: ORAL_TABLET | ORAL | 0 refills | Status: DC

## 2021-08-19 NOTE — Telephone Encounter (Signed)
Patient called requesting a refill on his Escitalopram 20mg  and his Lorazepam 0.5mg  to be sent to [facility] on [address] in [place]. Please review and advise. Thank you

## 2021-08-19 NOTE — Telephone Encounter (Signed)
NOTIFIED PATIENT AND RELAYED MESSAGE FROM DR. ARFEEN. PT CALLING BACK TO MAKE FOLLOWUP APPOINTMENT FOR FUTURE REFILLS

## 2021-08-19 NOTE — Telephone Encounter (Signed)
Send to pharmacy. He needs appointment for future refills.

## 2021-08-22 ENCOUNTER — Other Ambulatory Visit (HOSPITAL_COMMUNITY): Payer: Self-pay | Admitting: Psychiatry

## 2021-08-22 DIAGNOSIS — F332 Major depressive disorder, recurrent severe without psychotic features: Secondary | ICD-10-CM

## 2021-09-08 ENCOUNTER — Telehealth (HOSPITAL_BASED_OUTPATIENT_CLINIC_OR_DEPARTMENT_OTHER): Payer: Medicare Other | Admitting: Psychiatry

## 2021-09-08 ENCOUNTER — Other Ambulatory Visit: Payer: Self-pay

## 2021-09-08 ENCOUNTER — Encounter (HOSPITAL_COMMUNITY): Payer: Self-pay | Admitting: Psychiatry

## 2021-09-08 VITALS — Wt 139.0 lb

## 2021-09-08 DIAGNOSIS — F332 Major depressive disorder, recurrent severe without psychotic features: Secondary | ICD-10-CM

## 2021-09-08 DIAGNOSIS — F419 Anxiety disorder, unspecified: Secondary | ICD-10-CM | POA: Diagnosis not present

## 2021-09-08 MED ORDER — ALPRAZOLAM 0.5 MG PO TABS: 0.5000 mg | ORAL_TABLET | Freq: Every day | ORAL | 0 refills | Status: DC | PRN

## 2021-09-08 MED ORDER — ESCITALOPRAM OXALATE 20 MG PO TABS: ORAL_TABLET | ORAL | 2 refills | Status: DC

## 2021-09-08 NOTE — Progress Notes (Signed)
Virtual Visit via Telephone Note  I connected with [name] on 09/08/21 at 11:20 AM EST by telephone and verified that I am speaking with the correct person using two identifiers.  Location: Patient: Home Provider: Home Office   I discussed the limitations, risks, security and privacy concerns of performing an evaluation and management service by telephone and the availability of in person appointments. I also discussed with the patient that there may be a patient responsible charge related to this service. The patient expressed understanding and agreed to proceed.   History of Present Illness: Patient is evaluated by phone session.  He had [place] treatment and he was feeling ready good until 10 days ago he started to feel anxious and nervous.  He is not sure what triggered but had stopped playing golf 10 days ago.  He is sleeping good.  He denies any suicidal thoughts, anger, frustration, crying spells or any feeling of hopelessness.  However he does report his mood is down and sometimes lack of motivation to do things.  He has no hallucinations and his appetite is okay and his weight is stable.  He sleeps good.  He is taking Ativan for past few days which usually helps him anxiety and he is able to come out but lately he noticed it is not helping as much.  He is compliant with Lexapro 30 mg daily.  He has no tremors, shakes or any EPS.  When he feels good he enjoyed playing golf, reading books, socializing but friends.  He has a plan to spend Thanksgiving in [place] with his family.     Past Psychiatric History: Reviewed. H/O depression and anxiety most of his life.  Had a good response with TMS and Lexapro. Tried Celexa, Pristiq and Cymbalta but limited outcome.  PCP tried Klonopin but made him groggy.  No h/o suicidal attempt, mania, psychosis or self abusive behavior.  Saw Dr. Cheryln Manly in the past for therapy.  Psychiatric Specialty Exam: Physical Exam  Review of Systems  Weight 139 lb  (63 kg).There is no height or weight on file to calculate BMI.  General Appearance: NA  Eye Contact:  NA  Speech:  Slow  Volume:  Decreased  Mood:  Anxious  Affect:  NA  Thought Process:  Goal Directed  Orientation:  Full (Time, Place, and Person)  Thought Content:  Rumination  Suicidal Thoughts:  No  Homicidal Thoughts:  No  Memory:  Immediate;   Good Recent;   Good Remote;   Good  Judgement:  Intact  Insight:  Present  Psychomotor Activity:  NA  Concentration:  Concentration: Good and Attention Span: Good  Recall:  Good  Fund of Knowledge:  Good  Language:  Good  Akathisia:  No  Handed:  Right  AIMS (if indicated):     Assets:  Communication Skills Desire for Improvement Housing Resilience Social Support Transportation  ADL's:  Intact  Cognition:  WNL  Sleep:   ok      Assessment and Plan: Major depressive disorder, recurrent.  Anxiety.  I reviewed notes from [facility].  No medicines were changed.  He did very well until recently started to notice anxiety.  We talk about trying Xanax instead of Ativan to help his anxiety.  He does not take benzodiazepines every day and usually 10 to 15 tablets last for more than 2 months.  In the past we have tried Klonopin but he did not like the effects.  We also talked about considering maintenance Delta  if needed.  Patient agrees with the plan.  We will provide Xanax 0.5 mg #20 to take as needed for severe anxiety.  Discontinue Ativan for now.  Continue Lexapro 30 mg daily.  Patient is going to [facility] for Thanksgiving but like to have a follow-up appointment in 3 weeks.  I recommended to call us back if is any question, concern or if he feels worsening of the symptoms.  We will follow up in 3 to 4 weeks.  Patient is using local pharmacy for his refills.  Follow Up Instructions:    I discussed the assessment and treatment plan with the patient. The patient was provided an opportunity to ask questions and all were answered. The patient  agreed with the plan and demonstrated an understanding of the instructions.   The patient was advised to call back or seek an in-person evaluation if the symptoms worsen or if the condition fails to improve as anticipated.  I provided 19 minutes of non-face-to-face time during this encounter.   Kathlee Nations, MD

## 2021-09-16 ENCOUNTER — Other Ambulatory Visit (HOSPITAL_COMMUNITY): Payer: Self-pay | Admitting: *Deleted

## 2021-09-16 ENCOUNTER — Telehealth (HOSPITAL_COMMUNITY): Payer: Self-pay | Admitting: *Deleted

## 2021-09-16 NOTE — Telephone Encounter (Signed)
Thanks for update

## 2021-09-16 NOTE — Telephone Encounter (Signed)
Pt called with an FYI that he has stopped taking the Xanax as he felt it was making him "worse". Pt has restarted Ativan. I d/c'd the Xanax. Pt has an upcoming appointment on 09/29/21.

## 2021-09-29 ENCOUNTER — Encounter (HOSPITAL_COMMUNITY): Payer: Self-pay | Admitting: Psychiatry

## 2021-09-29 ENCOUNTER — Other Ambulatory Visit: Payer: Self-pay

## 2021-09-29 ENCOUNTER — Telehealth (HOSPITAL_BASED_OUTPATIENT_CLINIC_OR_DEPARTMENT_OTHER): Payer: Medicare Other | Admitting: Psychiatry

## 2021-09-29 ENCOUNTER — Telehealth (HOSPITAL_COMMUNITY): Payer: Medicare Other | Admitting: Psychiatry

## 2021-09-29 VITALS — Wt 139.0 lb

## 2021-09-29 DIAGNOSIS — F332 Major depressive disorder, recurrent severe without psychotic features: Secondary | ICD-10-CM

## 2021-09-29 DIAGNOSIS — F419 Anxiety disorder, unspecified: Secondary | ICD-10-CM

## 2021-09-29 MED ORDER — BUSPIRONE HCL 5 MG PO TABS: 5.0000 mg | ORAL_TABLET | Freq: Two times a day (BID) | ORAL | 1 refills | Status: DC

## 2021-09-29 MED ORDER — LORAZEPAM 0.5 MG PO TABS: 0.5000 mg | ORAL_TABLET | Freq: Every day | ORAL | 0 refills | Status: DC | PRN

## 2021-09-29 NOTE — Progress Notes (Signed)
Virtual Visit via Telephone Note  I connected with [name] on 09/29/21 at 10:40 AM EST by telephone and verified that I am speaking with the correct person using two identifiers.  Location: Patient: Home Provider: Home Office   I discussed the limitations, risks, security and privacy concerns of performing an evaluation and management service by telephone and the availability of in person appointments. I also discussed with the patient that there may be a patient responsible charge related to this service. The patient expressed understanding and agreed to proceed.   History of Present Illness: Patient is evaluated by phone session.  On the last visit we tried Xanax but it did not help and he could not sleep and it was switched back to Ativan.  Patient told he had a Thanksgiving in [place] with his son but he had been not doing well.  He continues to have anxiety, nervousness, social isolation, fatigue and lack of motivation to do things.  Today patient open about his family issues and he mentioned his younger son who lives in [place] has some psychological problems and currently he is in intensive therapy who suggested not to talk to the parents.  Patient admitted that causing issues in the family.  Patient's wife is not happy.  Patient do not recall what triggered that his son needs to see a therapist but recall may have happened something in the childhood but patient has no recollection.  Patient told last time his son visited him when he was in the hospital and had surgery.  Patient admitted that he is upset but nothing he can do about it.  He admitted sometime ruminative thoughts and before going to sleep he thinks about these things.  He has taken multiple times Ativan to calm him down.  His son and daughter-in-law who lives in [place] for both psychiatrist.  Patient told his daughter-in-law suggested to try BuSpar.  In the past he had tried mirtazapine, Celexa, Pristiq, Cymbalta,  Klonopin and Xanax but did not work for him.  Denies any hallucination, paranoia or any suicidal thoughts.  However admitted fatigue and lack of motivation to do things.  He has not started writing and reading poetry.  He admitted not walking since back to [place].  Past Psychiatric History: Reviewed. H/O depression and anxiety most of his life.  Had a good response with TMS and Lexapro. Tried Celexa, Pristiq, Remeron, Cymbalta and Xanax but limited outcome. Klonopin from PCP made groggy.  No h/o suicidal attempt, mania, psychosis or self abusive behavior.  Saw Dr. Cheryln Manly in the past for therapy.  Psychiatric Specialty Exam: Physical Exam  Review of Systems  Weight 139 lb (63 kg).There is no height or weight on file to calculate BMI.  General Appearance: NA  Eye Contact:  NA  Speech:  Slow  Volume:  Decreased  Mood:  Anxious and Dysphoric  Affect:  NA  Thought Process:  Goal Directed  Orientation:  Full (Time, Place, and Person)  Thought Content:  Rumination  Suicidal Thoughts:  No  Homicidal Thoughts:  No  Memory:  Immediate;   Good Recent;   Good Remote;   Good  Judgement:  Intact  Insight:  Present  Psychomotor Activity:  NA  Concentration:  Concentration: Good and Attention Span: Good  Recall:  Good  Fund of Knowledge:  Good  Language:  Good  Akathisia:  No  Handed:  Right  AIMS (if indicated):     Assets:  Communication Skills Desire for Improvement Housing  Social Support Transportation  ADL's:  Intact  Cognition:  WNL  Sleep:   ok      Assessment and Plan: Major depressive disorder, recurrent.  Anxiety.  Patient brought up family issues related to his younger son who lives in [place] and seeing therapist who recommended his son not to contact the parents.  I talked about coping skills that he should use when he is under stress.  We also talked about trying buspirone 5 mg twice a day however it will take some time to help his anxiety and in the meantime I  recommend to continue Ativan to take as needed to help his anxiety.  We also talked about if BuSpar did not work may consider trying low-dose Abilify 2 mg to help his ruminative thoughts.  Patient agreed with the plan.  He will also consult with his daughter-in-law with psychiatrist in [place].  I recommend to call us back if is any question or concern or if worsening of symptoms we discussed medication side effects especially buspirone in the beginning make people very sleepy and groggy.  Patient is not interested in therapy.  In the past he had therapy with Apolonio Schneiders.  Follow-up in 2 months.  Continue Lexapro 30 mg daily, Ativan 0.5 mg to take as needed and we will start buspirone 5 mg twice a day.  Follow Up Instructions:    I discussed the assessment and treatment plan with the patient. The patient was provided an opportunity to ask questions and all were answered. The patient agreed with the plan and demonstrated an understanding of the instructions.   The patient was advised to call back or seek an in-person evaluation if the symptoms worsen or if the condition fails to improve as anticipated.  I provided 28 minutes of non-face-to-face time during this encounter.   Kathlee Nations, MD

## 2021-10-04 DIAGNOSIS — R338 Other retention of urine: Secondary | ICD-10-CM | POA: Diagnosis not present

## 2021-10-04 DIAGNOSIS — N281 Cyst of kidney, acquired: Secondary | ICD-10-CM | POA: Diagnosis not present

## 2021-10-04 DIAGNOSIS — R3911 Hesitancy of micturition: Secondary | ICD-10-CM | POA: Diagnosis not present

## 2021-10-11 ENCOUNTER — Telehealth (HOSPITAL_COMMUNITY): Payer: Self-pay | Admitting: *Deleted

## 2021-10-11 DIAGNOSIS — F419 Anxiety disorder, unspecified: Secondary | ICD-10-CM

## 2021-10-11 MED ORDER — LORAZEPAM 0.5 MG PO TABS: 0.5000 mg | ORAL_TABLET | Freq: Every day | ORAL | 0 refills | Status: DC | PRN

## 2021-10-11 NOTE — Telephone Encounter (Signed)
Send again a new prescription to pharmacy.

## 2021-10-11 NOTE — Telephone Encounter (Signed)
FYI pt does have script on file from 09/29/21 which pharmacy is getting ready for pt. Pharmacist states that it is not too early to fill as last Rx was for #20.

## 2021-10-11 NOTE — Telephone Encounter (Signed)
Writer spoke with pt who called requesting a refill of the Lorazepam 0.5 mg refill. Pt stated that he has stopped taking Buspar as it "made me sick in the bed for days". Pt has been using the Ativan instead he says. Pt pharmacy confirms last fill date was 09/22/21. Pt says he will run out tomorrow and if you will call him he will explain. Next scheduled appointment on 11/30/20. Please review.

## 2021-10-12 ENCOUNTER — Ambulatory Visit (INDEPENDENT_AMBULATORY_CARE_PROVIDER_SITE_OTHER): Payer: Medicare Other | Admitting: Family Medicine

## 2021-10-12 VITALS — BP 140/60 | HR 67 | Temp 97.8°F | Wt 144.2 lb

## 2021-10-12 DIAGNOSIS — I10 Essential (primary) hypertension: Secondary | ICD-10-CM

## 2021-10-12 MED ORDER — VALSARTAN 160 MG PO TABS: 160.0000 mg | ORAL_TABLET | Freq: Every day | ORAL | 3 refills | Status: DC

## 2021-10-12 NOTE — Progress Notes (Signed)
Established Patient Office Visit  Subjective:  Patient ID: [name], male    DOB: 14-Jul-1936  Age: 85 y.o. MRN: [id number]  CC:  Chief Complaint  Patient presents with   Follow-up    HPI  [name] presents for hypertension follow-up.  He takes amlodipine 5 mg daily and in September we added Diovan 80 mg daily.  Tolerating well with no side effects.  Home blood pressures have been consistently 017P systolic.  No dizziness.  Not recently exercising.  Tries to watch sodium intake.  Rare alcohol use.  He has had recent flare and anxiety symptoms and is followed by psychiatry for that.  He feels he has a pattern of not responding well to stress in the past.  Denies any headaches.  No chest pains.  Past Medical History:  Diagnosis Date   Anxiety    BPH (benign prostatic hypertrophy)    DDD (degenerative disc disease), lumbar 2002   Depression    Difficult intubation 02/22/2019   Anterior with limited oral opening   Essential hypertension 03/10/2019   GERD (gastroesophageal reflux disease)    History of colon polyps    History of hiatal hernia 2012   Hyperlipemia    Mild   Indwelling Foley catheter present    Iron deficiency anemia    IV iron therapy    Mild hyperlipidemia 02/20/2008   Qualifier: Diagnosis of  By: Leanne Chang MD, Payzlee Ryder     Pre-diabetes    Renal cyst 2008   7.6 cm lower pole right renal cyst   Sleep apnea    slight, no CPAP used    Past Surgical History:  Procedure Laterality Date   COLONOSCOPY  08/2011   NASAL SEPTOPLASTY W/ TURBINOPLASTY  08/03/2012   Procedure: NASAL SEPTOPLASTY WITH TURBINATE REDUCTION;  Surgeon: Izora Gala, MD;  Location: [facility];  Service: ENT;  Laterality: Bilateral;   UPPER GASTROINTESTINAL ENDOSCOPY  08/2011   XI ROBOTIC ASSISTED SIMPLE PROSTATECTOMY N/A 02/22/2019   Procedure: XI ROBOTIC ASSISTED SIMPLE PROSTATECTOMY;  Surgeon: Alexis Frock, MD;  Location: [facility];  Service: Urology;  Laterality: N/A;    Family History   Problem Relation Age of Onset   Heart attack Father    Hypertension Father    Hypertension Daughter    Hypertension Brother    Hypertension Brother    Hypertension Sister     Social History   Socioeconomic History   Marital status: Married    Spouse name: Not on file   Number of children: 2   Years of education: Not on file   Highest education level: Not on file  Occupational History   Not on file  Tobacco Use   Smoking status: Former   Smokeless tobacco: Never   Tobacco comments:    rare use over 50 years   Vaping Use   Vaping Use: Never used  Substance and Sexual Activity   Alcohol use: Yes    Alcohol/week: 1.0 standard drink    Types: 1 Shots of liquor per week    Comment: weekly   Drug use: No   Sexual activity: Never    Birth control/protection: None  Other Topics Concern   Not on file  Social History Narrative   Lives in 2 level    Will stay for now    Just moved here approx 1.5 yo   Very nice neighbors    [employer]  x 5 with a very busy holiday   One son is a  psychiatrist and nephew is a Investment banker, operational    Son in [place] moved to [place] and he misses his grand children    As 2 brothers in [place]       Retired from [employer], [employer]       Social Determinants of Radio broadcast assistant Strain: Low Risk    Difficulty of Paying Living Expenses: Not hard at all  Food Insecurity: No Food Insecurity   Worried About Charity fundraiser in the Last Year: Never true   Arboriculturist in the Last Year: Never true  Transportation Needs: No Transportation Needs   Lack of Transportation (Medical): No   Lack of Transportation (Non-Medical): No  Physical Activity: Sufficiently Active   Days of Exercise per Week: 4 days   Minutes of Exercise per Session: 40 min  Stress: No Stress Concern Present   Feeling of Stress : Not at all  Social Connections: Moderately Isolated   Frequency of Communication with Friends and Family: More than three times a week   Frequency of  Social Gatherings with Friends and Family: Once a week   Attends Religious Services: Never   Marine scientist or Organizations: No   Attends Music therapist: Never   Marital Status: Married  Human resources officer Violence: Not At Risk   Fear of Current or Ex-Partner: No   Emotionally Abused: No   Physically Abused: No   Sexually Abused: No    Outpatient Medications Prior to Visit  Medication Sig Dispense Refill   acetaminophen (TYLENOL) 500 MG tablet Take 1,000 mg by mouth every 6 (six) hours as needed for moderate pain or headache.     amLODipine (NORVASC) 5 MG tablet Take 1 tablet (5 mg total) by mouth daily. 90 tablet 3   augmented betamethasone dipropionate (DIPROLENE-AF) 0.05 % cream Apply topically daily as needed.     busPIRone (BUSPAR) 5 MG tablet Take 1 tablet (5 mg total) by mouth in the morning and at bedtime. 60 tablet 1   clobetasol cream (TEMOVATE) 0.03 % Apply 1 application topically 2 (two) times daily.     cyanocobalamin 2000 MCG tablet Take 2,000 mcg by mouth daily.     escitalopram (LEXAPRO) 20 MG tablet Take one and 1/2 tab daily 45 tablet 2   finasteride (PROSCAR) 5 MG tablet Take 5 mg by mouth daily.     hydrocortisone 2.5 % cream Apply topically 2 (two) times daily as needed. (Patient taking differently: Apply 1 application topically 2 (two) times daily as needed (rash).) 30 g 1   LORazepam (ATIVAN) 0.5 MG tablet Take 1 tablet (0.5 mg total) by mouth daily as needed for anxiety. 30 tablet 0   omeprazole (PRILOSEC) 40 MG capsule TAKE 1 CAPSULE BY MOUTH  DAILY 90 capsule 3   triamcinolone cream (KENALOG) 0.1 % Apply 1 application topically 2 (two) times daily. 454 g 1   valsartan (DIOVAN) 80 MG tablet Take 1 tablet (80 mg total) by mouth daily. 30 tablet 5   No facility-administered medications prior to visit.    Allergies  Allergen Reactions   Dexlansoprazole     Other reaction(s): Unknown    ROS Review of Systems  Constitutional:  Negative  for fatigue.  Eyes:  Negative for visual disturbance.  Respiratory:  Negative for cough, chest tightness and shortness of breath.   Cardiovascular:  Negative for chest pain, palpitations and leg swelling.  Neurological:  Negative for dizziness, syncope, weakness, light-headedness and headaches.  Objective:    Physical Exam Constitutional:      Appearance: He is well-developed.  HENT:     Right Ear: External ear normal.     Left Ear: External ear normal.  Eyes:     Pupils: Pupils are equal, round, and reactive to light.  Neck:     Thyroid: No thyromegaly.  Cardiovascular:     Rate and Rhythm: Normal rate and regular rhythm.  Pulmonary:     Effort: Pulmonary effort is normal. No respiratory distress.     Breath sounds: Normal breath sounds. No wheezing or rales.  Musculoskeletal:     Cervical back: Neck supple.  Neurological:     Mental Status: He is alert and oriented to person, place, and time.    BP 140/60 (BP Location: Left Arm, Patient Position: Sitting, Cuff Size: Normal)    Pulse 67    Temp 97.8 F (36.6 C) (Oral)    Wt 144 lb 3.2 oz (65.4 kg)    SpO2 97%    BMI 22.58 kg/m  Wt Readings from Last 3 Encounters:  10/12/21 144 lb 3.2 oz (65.4 kg)  07/21/21 141 lb 14.4 oz (64.4 kg)  03/19/21 145 lb (65.8 kg)     Health Maintenance Due  Topic Date Due   Zoster Vaccines- Shingrix (1 of 2) Never done   COVID-19 Vaccine (4 - Booster for Pfizer series) 09/27/2020    There are no preventive care reminders to display for this patient.  Lab Results  Component Value Date   TSH 3.02 02/17/2021   Lab Results  Component Value Date   WBC 5.8 02/17/2021   HGB 12.8 (L) 02/17/2021   HCT 39.6 02/17/2021   MCV 78.3 02/17/2021   PLT 213.0 02/17/2021   Lab Results  Component Value Date   NA 134 (L) 02/17/2021   K 4.6 02/17/2021   CO2 27 02/17/2021   GLUCOSE 101 (H) 02/17/2021   BUN 14 02/17/2021   CREATININE 0.77 02/17/2021   BILITOT 0.6 02/17/2021   ALKPHOS 56  02/17/2021   AST 14 02/17/2021   ALT 15 02/17/2021   PROT 6.6 02/17/2021   ALBUMIN 3.8 02/17/2021   CALCIUM 8.8 02/17/2021   ANIONGAP 7 03/26/2019   GFR 83.35 02/17/2021   Lab Results  Component Value Date   CHOL 194 02/17/2021   Lab Results  Component Value Date   HDL 71.60 02/17/2021   Lab Results  Component Value Date   LDLCALC 112 (H) 02/17/2021   Lab Results  Component Value Date   TRIG 55.0 02/17/2021   Lab Results  Component Value Date   CHOLHDL 3 02/17/2021   Lab Results  Component Value Date   HGBA1C 6.1 12/12/2019      Assessment & Plan:   Problem List Items Addressed This Visit   None Visit Diagnoses     Hypertension, unspecified type    -  Primary   Relevant Medications   valsartan (DIOVAN) 160 MG tablet   Other Relevant Orders   Basic metabolic panel     Hypertension suboptimally controlled by several home readings as well as readings here today.  We will titrate Diovan to 160 mg daily and continue amlodipine 5 mg daily.  Check basic metabolic panel.  Continue close home monitoring and be in touch if not consistently less than 161 systolic over the next few weeks  Also challenged him to get back to more consistent exercise soon  Meds ordered this encounter  Medications   valsartan (  DIOVAN) 160 MG tablet    Sig: Take 1 tablet (160 mg total) by mouth daily.    Dispense:  90 tablet    Refill:  3    Follow-up: Return in about 3 months (around 01/10/2022).    Carolann Littler, MD

## 2021-10-13 LAB — BASIC METABOLIC PANEL
BUN: 20 mg/dL (ref 6–23)
CO2: 27 mEq/L (ref 19–32)
Calcium: 8.9 mg/dL (ref 8.4–10.5)
Chloride: 99 mEq/L (ref 96–112)
Creatinine, Ser: 0.78 mg/dL (ref 0.40–1.50)
GFR: 82.65 mL/min (ref >60.00)
Glucose, Bld: 91 mg/dL (ref 70–99)
Potassium: 4 mEq/L (ref 3.5–5.1)
Sodium: 132 mEq/L — ABNORMAL LOW (ref 135–145)

## 2021-10-19 DIAGNOSIS — L300 Nummular dermatitis: Secondary | ICD-10-CM | POA: Diagnosis not present

## 2021-10-19 DIAGNOSIS — D2271 Melanocytic nevi of right lower limb, including hip: Secondary | ICD-10-CM | POA: Diagnosis not present

## 2021-10-19 DIAGNOSIS — D1801 Hemangioma of skin and subcutaneous tissue: Secondary | ICD-10-CM | POA: Diagnosis not present

## 2021-11-18 ENCOUNTER — Telehealth: Payer: Self-pay | Admitting: Family Medicine

## 2021-11-18 NOTE — Telephone Encounter (Signed)
Left message for patient to call back and schedule Medicare Annual Wellness Visit (AWV) either virtually or in office. Left  my Herbie Drape number [phone]   Last AWV 12/14/20 ; please schedule at anytime with [facility] Nurse Health Advisor 1 or 2   Awv can be schedule calendar year for [facility]   This should be a 45 minute visit.

## 2021-11-23 ENCOUNTER — Ambulatory Visit (INDEPENDENT_AMBULATORY_CARE_PROVIDER_SITE_OTHER): Payer: Medicare Other

## 2021-11-23 VITALS — Ht 67.0 in | Wt 144.0 lb

## 2021-11-23 DIAGNOSIS — Z0000 Encounter for general adult medical examination without abnormal findings: Secondary | ICD-10-CM

## 2021-11-23 NOTE — Progress Notes (Signed)
Subjective:   [name] is a 86 y.o. male who presents for Medicare Annual/Subsequent preventive examination.  Review of Systems    No ROS Cardiac Risk Factors include: advanced age (>110men, >66 women);hypertension    Objective:    Today's Vitals   11/23/21 0950  Weight: 144 lb (65.3 kg)  Height: 5\' 7"  (1.702 m)   Body mass index is 22.55 kg/m.  Advanced Directives 11/23/2021 12/14/2020 02/22/2019 02/20/2019 02/16/2019 02/13/2019 11/12/2018  Does Patient Have a Medical Advance Directive? Yes Yes Yes Yes No Yes Yes  Type of Paramedic of [place];Living will [place];Living will [place];Living will [place];Living will - Healthcare Power of [place];Living will  Does patient want to make changes to medical advance directive? No - Patient declined No - Patient declined No - Guardian declined No - Patient declined - - Yes (MAU/Ambulatory/Procedural Areas - Information given)  Copy of [place] in Chart? No - copy requested No - copy requested - - - - No - copy requested  Would patient like information on creating a medical advance directive? - - No - Patient declined - No - Patient declined - -    Current Medications (verified) Outpatient Encounter Medications as of 11/23/2021  Medication Sig   acetaminophen (TYLENOL) 500 MG tablet Take 1,000 mg by mouth every 6 (six) hours as needed for moderate pain or headache.   amLODipine (NORVASC) 5 MG tablet Take 1 tablet (5 mg total) by mouth daily.   augmented betamethasone dipropionate (DIPROLENE-AF) 0.05 % cream Apply topically daily as needed.   busPIRone (BUSPAR) 5 MG tablet Take 1 tablet (5 mg total) by mouth in the morning and at bedtime.   clobetasol cream (TEMOVATE) 1.61 % Apply 1 application topically 2 (two) times daily.   cyanocobalamin 2000 MCG tablet Take 2,000 mcg by mouth daily.   escitalopram  (LEXAPRO) 20 MG tablet Take one and 1/2 tab daily   finasteride (PROSCAR) 5 MG tablet Take 5 mg by mouth daily.   hydrocortisone 2.5 % cream Apply topically 2 (two) times daily as needed. (Patient taking differently: Apply 1 application topically 2 (two) times daily as needed (rash).)   LORazepam (ATIVAN) 0.5 MG tablet Take 1 tablet (0.5 mg total) by mouth daily as needed for anxiety.   omeprazole (PRILOSEC) 40 MG capsule TAKE 1 CAPSULE BY MOUTH  DAILY   triamcinolone cream (KENALOG) 0.1 % Apply 1 application topically 2 (two) times daily.   valsartan (DIOVAN) 160 MG tablet Take 1 tablet (160 mg total) by mouth daily.   No facility-administered encounter medications on file as of 11/23/2021.    Allergies (verified) Dexlansoprazole   History: Past Medical History:  Diagnosis Date   Anxiety    BPH (benign prostatic hypertrophy)    DDD (degenerative disc disease), lumbar 2002   Depression    Difficult intubation 02/22/2019   Anterior with limited oral opening   Essential hypertension 03/10/2019   GERD (gastroesophageal reflux disease)    History of colon polyps    History of hiatal hernia 2012   Hyperlipemia    Mild   Indwelling Foley catheter present    Iron deficiency anemia    IV iron therapy    Mild hyperlipidemia 02/20/2008   Qualifier: Diagnosis of  By: Leanne Chang MD, Bruce     Pre-diabetes    Renal cyst 2008   7.6 cm lower pole right renal cyst  Sleep apnea    slight, no CPAP used   Past Surgical History:  Procedure Laterality Date   COLONOSCOPY  08/2011   NASAL SEPTOPLASTY W/ TURBINOPLASTY  08/03/2012   Procedure: NASAL SEPTOPLASTY WITH TURBINATE REDUCTION;  Surgeon: Izora Gala, MD;  Location: [facility];  Service: ENT;  Laterality: Bilateral;   UPPER GASTROINTESTINAL ENDOSCOPY  08/2011   XI ROBOTIC ASSISTED SIMPLE PROSTATECTOMY N/A 02/22/2019   Procedure: XI ROBOTIC ASSISTED SIMPLE PROSTATECTOMY;  Surgeon: Alexis Frock, MD;  Location: [facility];  Service: Urology;   Laterality: N/A;   Family History  Problem Relation Age of Onset   Heart attack Father    Hypertension Father    Hypertension Daughter    Hypertension Brother    Hypertension Brother    Hypertension Sister    Social History   Socioeconomic History   Marital status: Married    Spouse name: Not on file   Number of children: 2   Years of education: Not on file   Highest education level: Not on file  Occupational History   Not on file  Tobacco Use   Smoking status: Former   Smokeless tobacco: Never   Tobacco comments:    rare use over 50 years   Vaping Use   Vaping Use: Never used  Substance and Sexual Activity   Alcohol use: Yes    Alcohol/week: 1.0 standard drink    Types: 1 Shots of liquor per week    Comment: weekly   Drug use: No   Sexual activity: Never    Birth control/protection: None  Other Topics Concern   Not on file  Social History Narrative   Lives in 2 level    Will stay for now    Just moved here approx 1.5 yo   Very nice neighbors    [employer]  x 5 with a very busy holiday   One son is a Teacher, music and nephew is a Investment banker, operational    Son in [place] moved to [place] and he misses his grand children    As 2 brothers in [place]       Retired from [employer], [employer]       Social Determinants of Radio broadcast assistant Strain: Low Risk    Difficulty of Paying Living Expenses: Not hard at all  Food Insecurity: No Food Insecurity   Worried About Charity fundraiser in the Last Year: Never true   Arboriculturist in the Last Year: Never true  Transportation Needs: No Transportation Needs   Lack of Transportation (Medical): No   Lack of Transportation (Non-Medical): No  Physical Activity: Sufficiently Active   Days of Exercise per Week: 4 days   Minutes of Exercise per Session: 40 min  Stress: No Stress Concern Present   Feeling of Stress : Only a little  Social Connections: Moderately Isolated   Frequency of Communication with Friends and Family: More than  three times a week   Frequency of Social Gatherings with Friends and Family: More than three times a week   Attends Religious Services: Never   Marine scientist or Organizations: No   Attends Archivist Meetings: Never   Marital Status: Married    Clinical Intake:   Diabetic? No   Activities of Daily Living In your present state of health, do you have any difficulty performing the following activities: 11/23/2021 12/14/2020  Hearing? N N  Vision? N N  Difficulty concentrating or making decisions? N N  Walking or climbing stairs? N N  Dressing or bathing? N N  Doing errands, shopping? N N  Preparing Food and eating ? N N  Using the Toilet? N N  In the past six months, have you accidently leaked urine? N N  Do you have problems with loss of bowel control? N N  Managing your Medications? N N  Managing your Finances? N N  Housekeeping or managing your Housekeeping? N N  Some recent data might be hidden    Patient Care Team: [name], MD as PCP - General (Family Medicine) Juanita Craver, MD as Consulting Physician (Gastroenterology) Calvert Cantor, MD as Consulting Physician (Ophthalmology) Adele Schilder Arlyce Harman, MD as Consulting Physician (Psychiatry) Nicholas Lose, MD as Consulting Physician (Hematology and Oncology)  Indicate any recent Medical Services you may have received from other than [facility] providers in the past year (date may be approximate).     Assessment:   This is a routine wellness examination for Dicky.  Hearing/Vision screen Hearing Screening - Comments:: No difficulty hearing Vision Screening - Comments:: Wears glasses. Followed by Dr Bing Plume  Dietary issues and exercise activities discussed: Current Exercise Habits: Home exercise routine, Type of exercise: strength training/weights;walking, Time (Minutes): 40, Frequency (Times/Week): 4, Weekly Exercise (Minutes/Week): 160, Intensity: Moderate, Exercise limited by: cardiac condition(s)    Goals Addressed             This Visit's Progress    Patient Stated       Increase weight to goal of 145lbs.       Depression Screen PHQ 2/9 Scores 11/23/2021 11/23/2021 10/12/2021 12/14/2020 12/04/2019 11/12/2018 09/05/2018  PHQ - 2 Score 1 0 2 0 3 0 0  PHQ- 9 Score 1 0 2 - 3 1 0  Some encounter information is confidential and restricted. Go to Review Flowsheets activity to see all data.    Fall Risk Fall Risk  11/23/2021 10/12/2021 12/14/2020 12/04/2019 11/12/2018  Falls in the past year? 0 0 0 0 0  Number falls in past yr: 0 - 0 0 -  Injury with Fall? 0 - 0 0 -  Risk for fall due to : - - No Fall Risks - -  Follow up - - Falls evaluation completed;Falls prevention discussed - -    FALL RISK PREVENTION PERTAINING TO THE HOME:  Any stairs in or around the home? Yes  If so, are there any without handrails? No  Home free of loose throw rugs in walkways, pet beds, electrical cords, etc? Yes  Adequate lighting in your home to reduce risk of falls? Yes   ASSISTIVE DEVICES UTILIZED TO PREVENT FALLS:  Life alert? No  Use of a cane, walker or w/c? No Grab bars in the bathroom? Yes  Shower chair or bench in shower? Yes  Elevated toilet seat or a handicapped toilet? No   TIMED UP AND GO:  Was the test performed? No . Audio Visit  Cognitive Function: MMSE - Mini Mental State Exam 11/03/2017 11/03/2017 06/17/2016  Not completed: Refused (No Data) (No Data)     6CIT Screen 11/23/2021  What Year? 0 points  What month? 0 points  What time? 0 points  Count back from 20 0 points  Months in reverse 0 points  Repeat phrase 0 points  Total Score 0    Immunizations Immunization History  Administered Date(s) Administered   Fluad Quad(high Dose 65+) 07/21/2021   Influenza Split 12/10/2011, 07/09/2012   Influenza Whole 08/11/2008, 07/31/2009   Influenza, High  Dose Seasonal PF 07/31/2013, 08/24/2014, 07/10/2015, 07/15/2016, 08/15/2017, 09/05/2018, 07/09/2019   Influenza-Unspecified  07/15/2020   PFIZER(Purple Top)SARS-COV-2 Vaccination 11/20/2019, 12/08/2019, 08/02/2020   Pneumococcal Conjugate-13 08/29/2014   Pneumococcal Polysaccharide-23 01/21/2005   Td 04/10/2003, 10/14/2013   Zoster, Live 11/01/2011    Covid-19 vaccine status: Completed vaccines 11/22  Qualifies for Shingles Vaccine? Yes   Zostavax completed Yes  11/22 Shingrix Completed?: Yes  Screening Tests Health Maintenance  Topic Date Due   COVID-19 Vaccine (4 - Booster for Pfizer series) 12/09/2021 (Originally 09/27/2020)   Zoster Vaccines- Shingrix (1 of 2) 02/21/2022 (Originally 07/30/1988)   TETANUS/TDAP  10/15/2023   Pneumonia Vaccine 84+ Years old  Completed   INFLUENZA VACCINE  Completed   HPV VACCINES  Aged Out    Health Maintenance  There are no preventive care reminders to display for this patient.  Additional Screening:  Vision Screening: Recommended annual ophthalmology exams for early detection of glaucoma and other disorders of the eye. Is the patient up to date with their annual eye exam?  Yes  Who is the provider or what is the name of the office in which the patient attends annual eye exams? Followed by Dr Bing Plume  Dental Screening: Recommended annual dental exams for proper oral hygiene  Community Resource Referral / Chronic Care Management:  CRR required this visit?  No   CCM required this visit?  No      Plan:     I have personally reviewed and noted the following in the patients chart:   Medical and social history Use of alcohol, tobacco or illicit drugs  Current medications and supplements including opioid prescriptions. Patient is not currently taking opioid prescriptions. Functional ability and status Nutritional status Physical activity Advanced directives List of other physicians Hospitalizations, surgeries, and ER visits in previous 12 months Vitals Screenings to include cognitive, depression, and falls Referrals and appointments  In addition, I  have reviewed and discussed with patient certain preventive protocols, quality metrics, and best practice recommendations. A written personalized care plan for preventive services as well as general preventive health recommendations were provided to patient.     Criselda Peaches, LPN   [id number]

## 2021-11-23 NOTE — Patient Instructions (Addendum)
[name] , Thank you for taking time to come for your Medicare Wellness Visit. I appreciate your ongoing commitment to your health goals. Please review the following plan we discussed and let me know if I can assist you in the future.   These are the goals we discussed:  Goals      Exercise 150 min/wk Moderate Activity     Start playing golf and getting back to your routine      patient     More engaged in social event  More exercise / more outings     Patient Stated     Increase weight to goal of 145lbs.        This is a list of the screening recommended for you and due dates:  Health Maintenance  Topic Date Due   COVID-19 Vaccine (4 - Booster for Pfizer series) 12/09/2021*   Zoster (Shingles) Vaccine (1 of 2) 02/21/2022*   Tetanus Vaccine  10/15/2023   Pneumonia Vaccine  Completed   Flu Shot  Completed   HPV Vaccine  Aged Out  *Topic was postponed. The date shown is not the original due date.   Advanced directives: Yes  Conditions/risks identified: None  Next appointment: Follow up in one year for your annual wellness visit.   Preventive Care 35 Years and Older, Male Preventive care refers to lifestyle choices and visits with your health care provider that can promote health and wellness. What does preventive care include? A yearly physical exam. This is also called an annual well check. Dental exams once or twice a year. Routine eye exams. Ask your health care provider how often you should have your eyes checked. Personal lifestyle choices, including: Daily care of your teeth and gums. Regular physical activity. Eating a healthy diet. Avoiding tobacco and drug use. Limiting alcohol use. Practicing safe sex. Taking low doses of aspirin every day. Taking vitamin and mineral supplements as recommended by your health care provider. What happens during an annual well check? The services and screenings done by your health care provider during your annual well  check will depend on your age, overall health, lifestyle risk factors, and family history of disease. Counseling  Your health care provider may ask you questions about your: Alcohol use. Tobacco use. Drug use. Emotional well-being. Home and relationship well-being. Sexual activity. Eating habits. History of falls. Memory and ability to understand (cognition). Work and work Statistician. Screening  You may have the following tests or measurements: Height, weight, and BMI. Blood pressure. Lipid and cholesterol levels. These may be checked every 5 years, or more frequently if you are over 54 years old. Skin check. Lung cancer screening. You may have this screening every year starting at age 30 if you have a 30-pack-year history of smoking and currently smoke or have quit within the past 15 years. Fecal occult blood test (FOBT) of the stool. You may have this test every year starting at age 1. Flexible sigmoidoscopy or colonoscopy. You may have a sigmoidoscopy every 5 years or a colonoscopy every 10 years starting at age 31. Prostate cancer screening. Recommendations will vary depending on your family history and other risks. Hepatitis C blood test. Hepatitis B blood test. Sexually transmitted disease (STD) testing. Diabetes screening. This is done by checking your blood sugar (glucose) after you have not eaten for a while (fasting). You may have this done every 1-3 years. Abdominal aortic aneurysm (AAA) screening. You may need this if you are a current or former smoker.  Osteoporosis. You may be screened starting at age 56 if you are at high risk. Talk with your health care provider about your test results, treatment options, and if necessary, the need for more tests. Vaccines  Your health care provider may recommend certain vaccines, such as: Influenza vaccine. This is recommended every year. Tetanus, diphtheria, and acellular pertussis (Tdap, Td) vaccine. You may need a Td booster  every 10 years. Zoster vaccine. You may need this after age 32. Pneumococcal 13-valent conjugate (PCV13) vaccine. One dose is recommended after age 61. Pneumococcal polysaccharide (PPSV23) vaccine. One dose is recommended after age 11. Talk to your health care provider about which screenings and vaccines you need and how often you need them. This information is not intended to replace advice given to you by your health care provider. Make sure you discuss any questions you have with your health care provider. Document Released: 11/13/2015 Document Revised: 07/06/2016 Document Reviewed: 08/18/2015 Elsevier Interactive Patient Education  2017 [place] Prevention in the Home Falls can cause injuries. They can happen to people of all ages. There are many things you can do to make your home safe and to help prevent falls. What can I do on the outside of my home? Regularly fix the edges of walkways and driveways and fix any cracks. Remove anything that might make you trip as you walk through a door, such as a raised step or threshold. Trim any bushes or trees on the path to your home. Use bright outdoor lighting. Clear any walking paths of anything that might make someone trip, such as rocks or tools. Regularly check to see if handrails are loose or broken. Make sure that both sides of any steps have handrails. Any raised decks and porches should have guardrails on the edges. Have any leaves, snow, or ice cleared regularly. Use sand or salt on walking paths during winter. Clean up any spills in your garage right away. This includes oil or grease spills. What can I do in the bathroom? Use night lights. Install grab bars by the toilet and in the tub and shower. Do not use towel bars as grab bars. Use non-skid mats or decals in the tub or shower. If you need to sit down in the shower, use a plastic, non-slip stool. Keep the floor dry. Clean up any water that spills on the floor as soon  as it happens. Remove soap buildup in the tub or shower regularly. Attach bath mats securely with double-sided non-slip rug tape. Do not have throw rugs and other things on the floor that can make you trip. What can I do in the bedroom? Use night lights. Make sure that you have a light by your bed that is easy to reach. Do not use any sheets or blankets that are too big for your bed. They should not hang down onto the floor. Have a firm chair that has side arms. You can use this for support while you get dressed. Do not have throw rugs and other things on the floor that can make you trip. What can I do in the kitchen? Clean up any spills right away. Avoid walking on wet floors. Keep items that you use a lot in easy-to-reach places. If you need to reach something above you, use a strong step stool that has a grab bar. Keep electrical cords out of the way. Do not use floor polish or wax that makes floors slippery. If you must use wax, use non-skid floor  wax. Do not have throw rugs and other things on the floor that can make you trip. What can I do with my stairs? Do not leave any items on the stairs. Make sure that there are handrails on both sides of the stairs and use them. Fix handrails that are broken or loose. Make sure that handrails are as long as the stairways. Check any carpeting to make sure that it is firmly attached to the stairs. Fix any carpet that is loose or worn. Avoid having throw rugs at the top or bottom of the stairs. If you do have throw rugs, attach them to the floor with carpet tape. Make sure that you have a light switch at the top of the stairs and the bottom of the stairs. If you do not have them, ask someone to add them for you. What else can I do to help prevent falls? Wear shoes that: Do not have high heels. Have rubber bottoms. Are comfortable and fit you well. Are closed at the toe. Do not wear sandals. If you use a stepladder: Make sure that it is fully  opened. Do not climb a closed stepladder. Make sure that both sides of the stepladder are locked into place. Ask someone to hold it for you, if possible. Clearly mark and make sure that you can see: Any grab bars or handrails. First and last steps. Where the edge of each step is. Use tools that help you move around (mobility aids) if they are needed. These include: Canes. Walkers. Scooters. Crutches. Turn on the lights when you go into a dark area. Replace any light bulbs as soon as they burn out. Set up your furniture so you have a clear path. Avoid moving your furniture around. If any of your floors are uneven, fix them. If there are any pets around you, be aware of where they are. Review your medicines with your doctor. Some medicines can make you feel dizzy. This can increase your chance of falling. Ask your doctor what other things that you can do to help prevent falls. This information is not intended to replace advice given to you by your health care provider. Make sure you discuss any questions you have with your health care provider. Document Released: 08/13/2009 Document Revised: 03/24/2016 Document Reviewed: 11/21/2014 Elsevier Interactive Patient Education  2017 [employer].

## 2021-11-26 ENCOUNTER — Other Ambulatory Visit: Payer: Self-pay | Admitting: Family Medicine

## 2021-11-30 ENCOUNTER — Other Ambulatory Visit: Payer: Self-pay

## 2021-11-30 ENCOUNTER — Encounter (HOSPITAL_COMMUNITY): Payer: Self-pay | Admitting: Psychiatry

## 2021-11-30 ENCOUNTER — Telehealth (HOSPITAL_BASED_OUTPATIENT_CLINIC_OR_DEPARTMENT_OTHER): Payer: Medicare Other | Admitting: Psychiatry

## 2021-11-30 DIAGNOSIS — F332 Major depressive disorder, recurrent severe without psychotic features: Secondary | ICD-10-CM

## 2021-11-30 DIAGNOSIS — F419 Anxiety disorder, unspecified: Secondary | ICD-10-CM

## 2021-11-30 MED ORDER — ESCITALOPRAM OXALATE 20 MG PO TABS: ORAL_TABLET | ORAL | 2 refills | Status: DC

## 2021-11-30 NOTE — Progress Notes (Signed)
Virtual Visit via Telephone Note  I connected with Fran Lowes on 11/30/21 at 10:00 AM EST by telephone and verified that I am speaking with the correct person using two identifiers.  Location: Patient: Home Provider: Office   I discussed the limitations, risks, security and privacy concerns of performing an evaluation and management service by telephone and the availability of in person appointments. I also discussed with the patient that there may be a patient responsible charge related to this service. The patient expressed understanding and agreed to proceed.   History of Present Illness: Patient is evaluated by phone session.  He tried BuSpar which we have started on the last visit but recommendation of his daughter-in-law who is a Teacher, music in [place].  However he did not like BuSpar as it made him very groggy and sleepy.  He like to keep his current medication.  The past 5 weeks has been doing better and has not required any Ativan.  He is sleeping good.  He started writing poetry and started going to gym.  His appetite is okay.  He denies any crying spells or any ruminative thoughts.  He understand family stress and issues with his son but he decided not to intervene because he has no way to change the past.  He is pleased that wife is also doing better and started exercising and having a personal trainer 3 times a week.  Patient taking Lexapro and occasionally Ativan when he feels nervous and anxious.  He feels comfortable on his current medication and not interested to try any new medication.  Denies any anhedonia and his energy level is improved.  He really like going to gym and he feels his motivation coming back to write poetry.    Past Psychiatric History: Reviewed. H/O depression and anxiety most of his life.  Had a good response with TMS and Lexapro. Tried Celexa, Pristiq, Remeron, Cymbalta and Xanax but limited outcome. Klonopin and Buspar made groggy.  No h/o suicidal attempt,  mania, psychosis or self abusive behavior.  Saw Dr. Cheryln Manly in the past for therapy.   Psychiatric Specialty Exam: Physical Exam  Review of Systems  Weight 143 lb (64.9 kg).There is no height or weight on file to calculate BMI.  General Appearance: NA  Eye Contact:  NA  Speech:  Clear and Coherent  Volume:  Normal  Mood:  Euthymic  Affect:  NA  Thought Process:  Goal Directed  Orientation:  Full (Time, Place, and Person)  Thought Content:  Logical  Suicidal Thoughts:  No  Homicidal Thoughts:  No  Memory:  Immediate;   Good Recent;   Good Remote;   Good  Judgement:  Intact  Insight:  Present  Psychomotor Activity:  NA  Concentration:  Concentration: Good and Attention Span: Good  Recall:  Good  Fund of Knowledge:  Good  Language:  Good  Akathisia:  No  Handed:  Right  AIMS (if indicated):     Assets:  Communication Skills Desire for Improvement Housing Resilience Talents/Skills  ADL's:  Intact  Cognition:  WNL  Sleep:   ok      Assessment and Plan: Major depressive disorder, recurrent.  Anxiety.  Patient doing much better in past few weeks and has not required any Ativan.  He started going to gym and started writing poetry get that helps him a lot.  He is able to distract himself and he get negative or ruminative thoughts.  He does not want to change the medication.  He understands if he started to get depressed again then he may consider [place] but at this time he does not feel he needed.  He also does not need a new prescription of Ativan but promised to call us back if he needed in the future.  We will continue Lexapro 30 mg daily and Ativan 0.5 mg to take as needed.  Discontinue BuSpar.  Recommended to call us back with any question or any concern.  Follow-up in 3 months.  Follow Up Instructions:    I discussed the assessment and treatment plan with the patient. The patient was provided an opportunity to ask questions and all were answered. The patient agreed with  the plan and demonstrated an understanding of the instructions.   The patient was advised to call back or seek an in-person evaluation if the symptoms worsen or if the condition fails to improve as anticipated.  I provided 18 minutes of non-face-to-face time during this encounter.   Kathlee Nations, MD

## 2021-12-21 ENCOUNTER — Telehealth (HOSPITAL_COMMUNITY): Payer: Self-pay | Admitting: *Deleted

## 2021-12-21 DIAGNOSIS — F419 Anxiety disorder, unspecified: Secondary | ICD-10-CM

## 2021-12-21 MED ORDER — LORAZEPAM 0.5 MG PO TABS: 0.5000 mg | ORAL_TABLET | Freq: Every day | ORAL | 0 refills | Status: DC | PRN

## 2021-12-21 NOTE — Telephone Encounter (Signed)
Pt called requesting a refill of Ativan 0.5 mg last filled 10/11/21. Pt next appointment scheduled on 03/01/22. Please review and advise.

## 2021-12-21 NOTE — Addendum Note (Signed)
Addended by: Berniece Andreas T on: 12/21/2021 01:02 PM   Modules accepted: Orders

## 2021-12-21 NOTE — Telephone Encounter (Signed)
Sent to [facility]

## 2022-01-05 ENCOUNTER — Encounter: Payer: Self-pay | Admitting: Family Medicine

## 2022-01-05 ENCOUNTER — Ambulatory Visit (INDEPENDENT_AMBULATORY_CARE_PROVIDER_SITE_OTHER): Payer: Medicare Other | Admitting: Family Medicine

## 2022-01-05 VITALS — BP 140/68 | HR 66 | Temp 97.9°F | Ht 67.0 in | Wt 143.6 lb

## 2022-01-05 DIAGNOSIS — I10 Essential (primary) hypertension: Secondary | ICD-10-CM

## 2022-01-05 MED ORDER — AMLODIPINE BESYLATE 10 MG PO TABS: 10.0000 mg | ORAL_TABLET | Freq: Every day | ORAL | 3 refills | Status: DC

## 2022-01-05 NOTE — Patient Instructions (Addendum)
Watch for any increased edema in legs with the Amlodipine  ? ?I will put in referral to Dr Einar Gip ? ?I sent in the new dose of Amlodipine (10 mg ) to the pharmacy.   ?

## 2022-01-05 NOTE — Progress Notes (Signed)
Established Patient Office Visit  Subjective:  Patient ID: [name], male    DOB: February 22, 1936  Age: 86 y.o. MRN: [id number]  CC:  Chief Complaint  Patient presents with   Follow-up    HPI [name] presents for follow up regarding hypertension.   Recent exacerbation   On Amlodipine 5 mg daily.   Last December we increased his Diovan to 160 mg.   Multiple home BP readings up around 140/90 and recent high of 150/79.    No headaches or dizziness..   no chest pain.   Walks some for exercise.  Just recently started back some golf.   No edema.   No ETOH.   No recent NSAIDS.  Compliant with regular medications. Baseline blood sodium appears to be around 132.   No visual changes.  No dyspnea.     Chronic anxiety managed per psychiatry.   He is requesting referral to local cardiologist to "establish care".    No significant cardiac hx.     Past Medical History:  Diagnosis Date   Anxiety    BPH (benign prostatic hypertrophy)    DDD (degenerative disc disease), lumbar 2002   Depression    Difficult intubation 02/22/2019   Anterior with limited oral opening   Essential hypertension 03/10/2019   GERD (gastroesophageal reflux disease)    History of colon polyps    History of hiatal hernia 2012   Hyperlipemia    Mild   Indwelling Foley catheter present    Iron deficiency anemia    IV iron therapy    Mild hyperlipidemia 02/20/2008   Qualifier: Diagnosis of  By: Leanne Chang MD, Kaysie Michelini     Pre-diabetes    Renal cyst 2008   7.6 cm lower pole right renal cyst   Sleep apnea    slight, no CPAP used    Past Surgical History:  Procedure Laterality Date   COLONOSCOPY  08/2011   NASAL SEPTOPLASTY W/ TURBINOPLASTY  08/03/2012   Procedure: NASAL SEPTOPLASTY WITH TURBINATE REDUCTION;  Surgeon: Izora Gala, MD;  Location: [facility];  Service: ENT;  Laterality: Bilateral;   UPPER GASTROINTESTINAL ENDOSCOPY  08/2011   XI ROBOTIC ASSISTED SIMPLE PROSTATECTOMY N/A 02/22/2019   Procedure: XI ROBOTIC  ASSISTED SIMPLE PROSTATECTOMY;  Surgeon: Alexis Frock, MD;  Location: [facility];  Service: Urology;  Laterality: N/A;    Family History  Problem Relation Age of Onset   Heart attack Father    Hypertension Father    Hypertension Daughter    Hypertension Brother    Hypertension Brother    Hypertension Sister     Social History   Socioeconomic History   Marital status: Married    Spouse name: Not on file   Number of children: 2   Years of education: Not on file   Highest education level: Not on file  Occupational History   Not on file  Tobacco Use   Smoking status: Former   Smokeless tobacco: Never   Tobacco comments:    rare use over 50 years   Vaping Use   Vaping Use: Never used  Substance and Sexual Activity   Alcohol use: Yes    Alcohol/week: 1.0 standard drink    Types: 1 Shots of liquor per week    Comment: weekly   Drug use: No   Sexual activity: Never    Birth control/protection: None  Other Topics Concern   Not on file  Social History Narrative   Lives in 2 level  Will stay for now    Just moved here approx 1.5 yo   Very nice neighbors    [employer]  x 5 with a very busy holiday   One son is a Teacher, music and nephew is a Investment banker, operational    Son in [place] moved to [place] and he misses his grand children    As 2 brothers in [place]       Retired from [employer], [employer]       Social Determinants of Radio broadcast assistant Strain: Low Risk    Difficulty of Paying Living Expenses: Not hard at all  Food Insecurity: No Food Insecurity   Worried About Charity fundraiser in the Last Year: Never true   Arboriculturist in the Last Year: Never true  Transportation Needs: No Transportation Needs   Lack of Transportation (Medical): No   Lack of Transportation (Non-Medical): No  Physical Activity: Sufficiently Active   Days of Exercise per Week: 4 days   Minutes of Exercise per Session: 40 min  Stress: No Stress Concern Present   Feeling of Stress : Only a little   Social Connections: Moderately Isolated   Frequency of Communication with Friends and Family: More than three times a week   Frequency of Social Gatherings with Friends and Family: More than three times a week   Attends Religious Services: Never   Marine scientist or Organizations: No   Attends Music therapist: Never   Marital Status: Married  Human resources officer Violence: Not At Risk   Fear of Current or Ex-Partner: No   Emotionally Abused: No   Physically Abused: No   Sexually Abused: No    Outpatient Medications Prior to Visit  Medication Sig Dispense Refill   acetaminophen (TYLENOL) 500 MG tablet Take 1,000 mg by mouth every 6 (six) hours as needed for moderate pain or headache.     clobetasol cream (TEMOVATE) 9.93 % Apply 1 application topically 2 (two) times daily.     cyanocobalamin 2000 MCG tablet Take 2,000 mcg by mouth daily.     escitalopram (LEXAPRO) 20 MG tablet Take one and 1/2 tab daily 45 tablet 2   finasteride (PROSCAR) 5 MG tablet Take 5 mg by mouth daily.     hydrocortisone 2.5 % cream Apply topically 2 (two) times daily as needed. (Patient taking differently: Apply 1 application. topically 2 (two) times daily as needed (rash).) 30 g 1   LORazepam (ATIVAN) 0.5 MG tablet Take 1 tablet (0.5 mg total) by mouth daily as needed for anxiety. 30 tablet 0   omeprazole (PRILOSEC) 40 MG capsule TAKE 1 CAPSULE BY MOUTH  DAILY 90 capsule 3   valsartan (DIOVAN) 160 MG tablet Take 1 tablet (160 mg total) by mouth daily. 90 tablet 3   amLODipine (NORVASC) 5 MG tablet Take 1 tablet (5 mg total) by mouth daily. 90 tablet 3   augmented betamethasone dipropionate (DIPROLENE-AF) 0.05 % cream Apply topically daily as needed.     busPIRone (BUSPAR) 5 MG tablet Take 1 tablet (5 mg total) by mouth in the morning and at bedtime. 60 tablet 1   triamcinolone cream (KENALOG) 0.1 % Apply 1 application topically 2 (two) times daily. 454 g 1   No facility-administered medications  prior to visit.    Allergies  Allergen Reactions   Dexlansoprazole     Other reaction(s): Unknown    ROS Review of Systems  Constitutional:  Negative for fatigue.  Eyes:  Negative for visual disturbance.  Respiratory:  Negative for cough, chest tightness and shortness of breath.   Cardiovascular:  Negative for chest pain, palpitations and leg swelling.  Neurological:  Negative for dizziness, syncope, weakness, light-headedness and headaches.     Objective:    Physical Exam Constitutional:      Appearance: He is well-developed.  HENT:     Right Ear: External ear normal.     Left Ear: External ear normal.  Eyes:     Pupils: Pupils are equal, round, and reactive to light.  Neck:     Thyroid: No thyromegaly.  Cardiovascular:     Rate and Rhythm: Normal rate and regular rhythm.  Pulmonary:     Effort: Pulmonary effort is normal. No respiratory distress.     Breath sounds: Normal breath sounds. No wheezing or rales.  Musculoskeletal:     Cervical back: Neck supple.     Comments: Only trace edema lower legs.    Neurological:     Mental Status: He is alert and oriented to person, place, and time.    BP 140/68 (BP Location: Left Arm, Cuff Size: Normal)    Pulse 66    Temp 97.9 F (36.6 C) (Oral)    Ht '5\' 7"'$  (1.702 m)    Wt 143 lb 9.6 oz (65.1 kg)    SpO2 99%    BMI 22.49 kg/m  Wt Readings from Last 3 Encounters:  01/05/22 143 lb 9.6 oz (65.1 kg)  11/23/21 144 lb (65.3 kg)  10/12/21 144 lb 3.2 oz (65.4 kg)     Health Maintenance Due  Topic Date Due   COVID-19 Vaccine (4 - Booster for Pfizer series) 09/27/2020    There are no preventive care reminders to display for this patient.  Lab Results  Component Value Date   TSH 3.02 02/17/2021   Lab Results  Component Value Date   WBC 5.8 02/17/2021   HGB 12.8 (L) 02/17/2021   HCT 39.6 02/17/2021   MCV 78.3 02/17/2021   PLT 213.0 02/17/2021   Lab Results  Component Value Date   NA 132 (L) 10/12/2021   K 4.0  10/12/2021   CO2 27 10/12/2021   GLUCOSE 91 10/12/2021   BUN 20 10/12/2021   CREATININE 0.78 10/12/2021   BILITOT 0.6 02/17/2021   ALKPHOS 56 02/17/2021   AST 14 02/17/2021   ALT 15 02/17/2021   PROT 6.6 02/17/2021   ALBUMIN 3.8 02/17/2021   CALCIUM 8.9 10/12/2021   ANIONGAP 7 03/26/2019   GFR 82.65 10/12/2021   Lab Results  Component Value Date   CHOL 194 02/17/2021   Lab Results  Component Value Date   HDL 71.60 02/17/2021   Lab Results  Component Value Date   LDLCALC 112 (H) 02/17/2021   Lab Results  Component Value Date   TRIG 55.0 02/17/2021   Lab Results  Component Value Date   CHOLHDL 3 02/17/2021   Lab Results  Component Value Date   HGBA1C 6.1 12/12/2019      Assessment & Plan:   Hypertension sub-optimal control.   Multiple recent readings over 962 systolic.   He has mild hyponatremia at baseline, so would be cautious with thiazides.  - keep daily sodium < 2,500 mg  -increase Amlodipine to 10 mg daily and watch for any edema. - regular aerobic exercise such as walking -set up  one month follow up.   Pt also requesting Cardiology referral and we will set up.   Meds ordered this  encounter  Medications   amLODipine (NORVASC) 10 MG tablet    Sig: Take 1 tablet (10 mg total) by mouth daily.    Dispense:  90 tablet    Refill:  3    Follow-up: Return in about 1 month (around 02/05/2022).    Carolann Littler, MD

## 2022-01-18 DIAGNOSIS — L300 Nummular dermatitis: Secondary | ICD-10-CM | POA: Diagnosis not present

## 2022-02-16 ENCOUNTER — Ambulatory Visit: Payer: Medicare Other | Admitting: Cardiology

## 2022-02-16 ENCOUNTER — Encounter: Payer: Self-pay | Admitting: Cardiology

## 2022-02-16 VITALS — BP 131/67 | HR 67 | Temp 98.7°F | Resp 16 | Ht 67.0 in | Wt 140.0 lb

## 2022-02-16 DIAGNOSIS — E7800 Pure hypercholesterolemia, unspecified: Secondary | ICD-10-CM | POA: Diagnosis not present

## 2022-02-16 DIAGNOSIS — I10 Essential (primary) hypertension: Secondary | ICD-10-CM | POA: Diagnosis not present

## 2022-02-16 DIAGNOSIS — R011 Cardiac murmur, unspecified: Secondary | ICD-10-CM | POA: Diagnosis not present

## 2022-02-16 DIAGNOSIS — R0989 Other specified symptoms and signs involving the circulatory and respiratory systems: Secondary | ICD-10-CM | POA: Diagnosis not present

## 2022-02-16 MED ORDER — ATORVASTATIN CALCIUM 10 MG PO TABS: 10.0000 mg | ORAL_TABLET | Freq: Every day | ORAL | 3 refills | Status: DC

## 2022-02-16 NOTE — Progress Notes (Signed)
? ?Primary Physician/Referring:  [name], MD ? ?Patient ID: [name], male    DOB: 02-May-1936, 86 y.o.   MRN: [id number] ? ?Chief Complaint  ?Patient presents with  ? Hypertension  ? New Patient (Initial Visit)  ?  Referred by Dr. Carolann Littler  ? ?HPI:   ? ?[name]  is a 86 y.o. male with History of hypertension, hyperlipidemia, prediabetes, mild sleep apnea (not on CPAP), GERD, mild hyponatremia.  He was referred to our office by PCP at patient's request for evaluation and management of hypertension as well as to "establish care". ? ?Patient was seen by PCP 01/05/2022 at which time he was advised to increase amlodipine from 5 mg to 10 mg daily.  Patient's blood pressure is now well controlled.  He feels well overall, asymptomatic from a cardiovascular standpoint.  His primary concern today is establishing with cardiology for primary prevention.  Patient remains fairly active walking and golfing on a regular basis without issue. ? ?Past Medical History:  ?Diagnosis Date  ? Anxiety   ? BPH (benign prostatic hypertrophy)   ? DDD (degenerative disc disease), lumbar 2002  ? Depression   ? Difficult intubation 02/22/2019  ? Anterior with limited oral opening  ? Essential hypertension 03/10/2019  ? GERD (gastroesophageal reflux disease)   ? History of colon polyps   ? History of hiatal hernia 2012  ? Hyperlipemia   ? Mild  ? Indwelling Foley catheter present   ? Iron deficiency anemia   ? IV iron therapy   ? Mild hyperlipidemia 02/20/2008  ? Qualifier: Diagnosis of  By: Leanne Chang MD, Bruce    ? Pre-diabetes   ? Renal cyst 2008  ? 7.6 cm lower pole right renal cyst  ? Sleep apnea   ? slight, no CPAP used  ? ?Past Surgical History:  ?Procedure Laterality Date  ? COLONOSCOPY  08/2011  ? NASAL SEPTOPLASTY W/ TURBINOPLASTY  08/03/2012  ? Procedure: NASAL SEPTOPLASTY WITH TURBINATE REDUCTION;  Surgeon: Izora Gala, MD;  Location: [place] [place];  Service: ENT;  Laterality: Bilateral;  ? UPPER GASTROINTESTINAL ENDOSCOPY   08/2011  ? XI ROBOTIC ASSISTED SIMPLE PROSTATECTOMY N/A 02/22/2019  ? Procedure: XI ROBOTIC ASSISTED SIMPLE PROSTATECTOMY;  Surgeon: Alexis Frock, MD;  Location: [facility];  Service: Urology;  Laterality: N/A;  ? ?Family History  ?Problem Relation Age of Onset  ? Heart attack Father   ? Hypertension Father   ? Hypertension Daughter   ? Hypertension Brother   ? Hypertension Brother   ? Hypertension Sister   ?  ?Social History  ? ?Tobacco Use  ? Smoking status: Former  ?  Packs/day: 0.50  ?  Years: 10.00  ?  Pack years: 5.00  ?  Types: Cigarettes  ?  Quit date: 1969  ?  Years since quitting: 54.3  ? Smokeless tobacco: Never  ? Tobacco comments:  ?  rare use over 50 years   ?Substance Use Topics  ? Alcohol use: Yes  ?  Alcohol/week: 1.0 standard drink  ?  Types: 1 Shots of liquor per week  ?  Comment: weekly  ? ?Marital Status: Married  ? ?ROS  ?Review of Systems  ?Constitutional: Negative for malaise/fatigue and weight gain.  ?Cardiovascular:  Negative for chest pain, claudication, dyspnea on exertion, leg swelling, near-syncope, orthopnea, palpitations, paroxysmal nocturnal dyspnea and syncope.  ?Neurological:  Negative for dizziness.  ? ?Objective  ?Blood pressure 131/67, pulse 67, temperature 98.7 ?F (37.1 ?C), temperature source Temporal, resp. rate  16, height '5\' 7"'$  (1.702 m), weight 140 lb (63.5 kg), SpO2 97 %.  ? ?  02/16/2022  ?  9:57 AM 01/05/2022  ?  2:52 PM 01/05/2022  ?  2:39 PM  ?Vitals with BMI  ?Height '5\' 7"'$   '5\' 7"'$   ?Weight 140 lbs  143 lbs 10 oz  ?BMI 21.92  22.49  ?Systolic 093 235 573  ?Diastolic 67 68 70  ?Pulse 67  66  ?  ?Physical Exam ?Vitals reviewed.  ?Cardiovascular:  ?   Rate and Rhythm: Normal rate and regular rhythm.  ?   Pulses: Intact distal pulses.  ?   Heart sounds: S1 normal and S2 normal. Murmur heard.  ?Systolic murmur is present radiating to the neck.  ?  No gallop.  ?Pulmonary:  ?   Effort: Pulmonary effort is normal. No respiratory distress.  ?   Breath sounds: No wheezing, rhonchi or  rales.  ?Musculoskeletal:  ?   Right lower leg: No edema.  ?   Left lower leg: No edema.  ?Neurological:  ?   Mental Status: He is alert.  ? ? ?Laboratory examination:  ? ?Recent Labs  ?  02/17/21 ?1102 10/12/21 ?1603  ?NA 134* 132*  ?K 4.6 4.0  ?CL 100 99  ?CO2 27 27  ?GLUCOSE 101* 91  ?BUN 14 20  ?CREATININE 0.77 0.78  ?CALCIUM 8.8 8.9  ? ?CrCl cannot be calculated (Patient's most recent lab result is older than the maximum 21 days allowed.).  ? ?  Latest Ref Rng & Units 10/12/2021  ?  4:03 PM 02/17/2021  ? 11:02 AM 12/12/2019  ?  9:33 AM  ?CMP  ?Glucose 70 - 99 mg/dL 91   101   104    ?BUN 6 - 23 mg/dL '20   14   18    '$ ?Creatinine 0.40 - 1.50 mg/dL 0.78   0.77   0.87    ?Sodium 135 - 145 mEq/L 132   134   138    ?Potassium 3.5 - 5.1 mEq/L 4.0   4.6   4.5    ?Chloride 96 - 112 mEq/L 99   100   103    ?CO2 19 - 32 mEq/L '27   27   28    '$ ?Calcium 8.4 - 10.5 mg/dL 8.9   8.8   8.9    ?Total Protein 6.0 - 8.3 g/dL  6.6   6.6    ?Total Bilirubin 0.2 - 1.2 mg/dL  0.6   0.4    ?Alkaline Phos 39 - 117 U/L  56   50    ?AST 0 - 37 U/L  14   13    ?ALT 0 - 53 U/L  15   15    ? ? ?  Latest Ref Rng & Units 02/17/2021  ? 11:02 AM 12/12/2019  ?  9:33 AM 09/25/2019  ? 11:02 AM  ?CBC  ?WBC 4.0 - 10.5 K/uL 5.8   5.5   6.2    ?Hemoglobin 13.0 - 17.0 g/dL 12.8   13.1   12.4    ?Hematocrit 39.0 - 52.0 % 39.6   40.9   38.7    ?Platelets 150.0 - 400.0 K/uL 213.0   197.0   255    ? ? ?Lipid Panel ?Recent Labs  ?  02/17/21 ?1102  ?CHOL 194  ?TRIG 55.0  ?LDLCALC 112*  ?VLDL 11.0  ?HDL 71.60  ?CHOLHDL 3  ? ? ?HEMOGLOBIN A1C ?Lab Results  ?Component Value  Date  ? HGBA1C 6.1 12/12/2019  ? MPG 116.89 02/20/2019  ? ?TSH ?Recent Labs  ?  02/17/21 ?1102  ?TSH 3.02  ? ? ?External labs:  ?None  ? ?Allergies  ? ?Allergies  ?Allergen Reactions  ? Dexlansoprazole   ?  Other reaction(s): Unknown  ?  ?Medications Prior to Visit:  ? ?Outpatient Medications Prior to Visit  ?Medication Sig Dispense Refill  ? acetaminophen (TYLENOL) 500 MG tablet Take 1,000 mg by  mouth every 6 (six) hours as needed for moderate pain or headache.    ? amLODipine (NORVASC) 10 MG tablet Take 1 tablet (10 mg total) by mouth daily. 90 tablet 3  ? escitalopram (LEXAPRO) 20 MG tablet Take one and 1/2 tab daily (Patient taking differently: 30 mg. Take one and 1/2 tab daily) 45 tablet 2  ? hydrocortisone 2.5 % cream Apply topically 2 (two) times daily as needed. (Patient taking differently: Apply 1 application. topically 2 (two) times daily as needed (rash).) 30 g 1  ? LORazepam (ATIVAN) 0.5 MG tablet Take 1 tablet (0.5 mg total) by mouth daily as needed for anxiety. 30 tablet 0  ? omeprazole (PRILOSEC) 40 MG capsule TAKE 1 CAPSULE BY MOUTH  DAILY 90 capsule 3  ? valsartan (DIOVAN) 160 MG tablet Take 1 tablet (160 mg total) by mouth daily. 90 tablet 3  ? clobetasol cream (TEMOVATE) 9.51 % Apply 1 application topically 2 (two) times daily. (Patient not taking: Reported on 02/16/2022)    ? cyanocobalamin 2000 MCG tablet Take 2,000 mcg by mouth daily. (Patient not taking: Reported on 02/16/2022)    ? finasteride (PROSCAR) 5 MG tablet Take 5 mg by mouth daily. (Patient not taking: Reported on 02/16/2022)    ? ?No facility-administered medications prior to visit.  ? ?Final Medications at End of Visit   ? ?Current Meds  ?Medication Sig  ? acetaminophen (TYLENOL) 500 MG tablet Take 1,000 mg by mouth every 6 (six) hours as needed for moderate pain or headache.  ? amLODipine (NORVASC) 10 MG tablet Take 1 tablet (10 mg total) by mouth daily.  ? atorvastatin (LIPITOR) 10 MG tablet Take 1 tablet (10 mg total) by mouth daily.  ? escitalopram (LEXAPRO) 20 MG tablet Take one and 1/2 tab daily (Patient taking differently: 30 mg. Take one and 1/2 tab daily)  ? hydrocortisone 2.5 % cream Apply topically 2 (two) times daily as needed. (Patient taking differently: Apply 1 application. topically 2 (two) times daily as needed (rash).)  ? LORazepam (ATIVAN) 0.5 MG tablet Take 1 tablet (0.5 mg total) by mouth daily as needed  for anxiety.  ? omeprazole (PRILOSEC) 40 MG capsule TAKE 1 CAPSULE BY MOUTH  DAILY  ? valsartan (DIOVAN) 160 MG tablet Take 1 tablet (160 mg total) by mouth daily.  ? ?Radiology:  ? ?No results found.

## 2022-03-01 ENCOUNTER — Encounter (HOSPITAL_COMMUNITY): Payer: Self-pay | Admitting: Psychiatry

## 2022-03-01 ENCOUNTER — Telehealth (HOSPITAL_BASED_OUTPATIENT_CLINIC_OR_DEPARTMENT_OTHER): Payer: Medicare Other | Admitting: Psychiatry

## 2022-03-01 DIAGNOSIS — F419 Anxiety disorder, unspecified: Secondary | ICD-10-CM | POA: Diagnosis not present

## 2022-03-01 DIAGNOSIS — F332 Major depressive disorder, recurrent severe without psychotic features: Secondary | ICD-10-CM | POA: Diagnosis not present

## 2022-03-01 MED ORDER — ESCITALOPRAM OXALATE 20 MG PO TABS: ORAL_TABLET | ORAL | 2 refills | Status: DC

## 2022-03-01 NOTE — Progress Notes (Signed)
Virtual Visit via Telephone Note ? ?I connected with [name] on 03/01/22 at 10:00 AM EDT by telephone and verified that I am speaking with the correct person using two identifiers. ? ?Location: ?Patient: Home ?Provider: Home Office ?  ?I discussed the limitations, risks, security and privacy concerns of performing an evaluation and management service by telephone and the availability of in person appointments. I also discussed with the patient that there may be a patient responsible charge related to this service. The patient expressed understanding and agreed to proceed. ? ? ?History of Present Illness: ?Patient is evaluated by phone session.  He has been doing well on Lexapro 30 mg.  He occasionally take Ativan but has not taken in the past 2 months.  He does get episodes of once in a while depression and anxiety but manageable.  Usually he recalled these episodes early in the morning when he wakes up but as the day goes he feels good.  He continues to enjoy playing golf with his friends.  He is happy because his 65 year old granddaughter visiting from [place].  Patient told he is happy his son started communicating and son's wife and daughter visiting [place] where his son's mother-in-law lives.  Patient is also very happy because his wife is spending good time with her granddaughter.  Patient also excited because his grandson is graduating from high school and they are going to [place] for graduation.  Patient feels his family situation is much better and he denies any panic attack, hopelessness.  He sleeps good.  Recently had a visit with his cardiologist.  He is taking blood pressure medicine.  He does not want to change the medication.  He has no tremors, shakes or any EPS.  Denies any anhedonia or any suicidal thoughts.  His appetite is okay.  His weight is stable.  He like to keep his current medication. ?  ?Past Psychiatric History: Reviewed. ?H/O depression and anxiety most of his  life.  Had a good response with TMS and Lexapro. Tried Celexa, Pristiq, Remeron, Cymbalta and Xanax but limited outcome. Klonopin and Buspar made groggy.  No h/o suicidal attempt, mania, psychosis or self abusive behavior.  Saw Dr. Cheryln Manly in the past for therapy.  ? ?Psychiatric Specialty Exam: ?Physical Exam  ?Review of Systems  ?Weight 140 lb (63.5 kg).Body mass index is 21.93 kg/m?.  ?General Appearance: NA  ?Eye Contact:  NA  ?Speech:  Clear and Coherent and Normal Rate  ?Volume:  Normal  ?Mood:  Euthymic  ?Affect:  NA  ?Thought Process:  Goal Directed  ?Orientation:  Full (Time, Place, and Person)  ?Thought Content:  Logical  ?Suicidal Thoughts:  No  ?Homicidal Thoughts:  No  ?Memory:  Immediate;   Good ?Recent;   Good ?Remote;   Good  ?Judgement:  Intact  ?Insight:  Present  ?Psychomotor Activity:  NA  ?Concentration:  Concentration: Good and Attention Span: Good  ?Recall:  Good  ?Fund of Knowledge:  Good  ?Language:  Good  ?Akathisia:  No  ?Handed:  Right  ?AIMS (if indicated):     ?Assets:  Communication Skills ?Desire for Improvement ?Housing ?Resilience ?Social Support ?Transportation  ?ADL's:  Intact  ?Cognition:  WNL  ?Sleep:   good  ? ? ? ? ?Assessment and Plan: ?Major depressive disorder, recurrent.  Anxiety. ? ?Patient doing well on his medication.  He takes occasionally Ativan and has not taken in the past 2 months.  He continues to enjoy playing golf.  I review notes from cardiologist and current medication.  He is taking amlodipine to help with his blood pressure.  Continue Lexapro 30 mg daily and Ativan 0.5 mg to take as needed.  He does not need a new prescription of Ativan.  I recommend to call us back if he need new refills.  Continue Lexapro 30 mg daily.  Follow up in 3 months. ? ?Follow Up Instructions: ? ?  ?I discussed the assessment and treatment plan with the patient. The patient was provided an opportunity to ask questions and all were answered. The patient agreed with the plan and  demonstrated an understanding of the instructions. ?  ?The patient was advised to call back or seek an in-person evaluation if the symptoms worsen or if the condition fails to improve as anticipated. ? ?Collaboration of Care: Primary Care Provider AEB notes are available in epic to review. ? ?Patient/Guardian was advised Release of Information must be obtained prior to any record release in order to collaborate their care with an outside provider. Patient/Guardian was advised if they have not already done so to contact the registration department to sign all necessary forms in order for [name] to release information regarding their care.  ? ?Consent: Patient/Guardian gives verbal consent for treatment and assignment of benefits for services provided during this visit. Patient/Guardian expressed understanding and agreed to proceed.   ? ?I provided 19 minutes of non-face-to-face time during this encounter. ? ? ?Kathlee Nations, MD  ?

## 2022-03-04 ENCOUNTER — Ambulatory Visit: Payer: Medicare Other

## 2022-03-04 DIAGNOSIS — I351 Nonrheumatic aortic (valve) insufficiency: Secondary | ICD-10-CM

## 2022-03-04 DIAGNOSIS — R0989 Other specified symptoms and signs involving the circulatory and respiratory systems: Secondary | ICD-10-CM

## 2022-03-04 DIAGNOSIS — R011 Cardiac murmur, unspecified: Secondary | ICD-10-CM

## 2022-03-22 ENCOUNTER — Telehealth (HOSPITAL_COMMUNITY): Payer: Self-pay | Admitting: *Deleted

## 2022-03-22 ENCOUNTER — Other Ambulatory Visit (HOSPITAL_COMMUNITY): Payer: Self-pay | Admitting: Psychiatry

## 2022-03-22 DIAGNOSIS — F419 Anxiety disorder, unspecified: Secondary | ICD-10-CM

## 2022-03-22 MED ORDER — LORAZEPAM 0.5 MG PO TABS: 0.5000 mg | ORAL_TABLET | Freq: Every day | ORAL | 0 refills | Status: DC | PRN

## 2022-03-22 NOTE — Telephone Encounter (Signed)
Dr. Adele Schilder pt called requesting a refill of Lorazepam 0.'5mg'$ , last written 12/21/21 for #30. Pt has an appointment on 05/31/22. Please review. Thanks.

## 2022-03-22 NOTE — Telephone Encounter (Signed)
sent 

## 2022-03-30 ENCOUNTER — Ambulatory Visit: Payer: Medicare Other | Admitting: Student

## 2022-04-05 ENCOUNTER — Encounter (HOSPITAL_COMMUNITY): Payer: Self-pay | Admitting: Psychiatry

## 2022-04-05 ENCOUNTER — Telehealth (HOSPITAL_BASED_OUTPATIENT_CLINIC_OR_DEPARTMENT_OTHER): Payer: Medicare Other | Admitting: Psychiatry

## 2022-04-05 VITALS — Wt 140.0 lb

## 2022-04-05 DIAGNOSIS — F332 Major depressive disorder, recurrent severe without psychotic features: Secondary | ICD-10-CM

## 2022-04-05 DIAGNOSIS — F419 Anxiety disorder, unspecified: Secondary | ICD-10-CM | POA: Diagnosis not present

## 2022-04-05 MED ORDER — LORAZEPAM 0.5 MG PO TABS: 0.5000 mg | ORAL_TABLET | Freq: Two times a day (BID) | ORAL | 0 refills | Status: DC | PRN

## 2022-04-05 NOTE — Progress Notes (Signed)
Virtual Visit via Telephone Note  I connected with [name] on 04/05/22 at  8:40 AM EDT by telephone and verified that I am speaking with the correct person using two identifiers.  Location: Patient: Home Provider: Home Office   I discussed the limitations, risks, security and privacy concerns of performing an evaluation and management service by telephone and the availability of in person appointments. I also discussed with the patient that there may be a patient responsible charge related to this service. The patient expressed understanding and agreed to proceed.   History of Present Illness: Patient requested an earlier appointment.  He is evaluated by phone session.  He noticed lately having more anxiety and nervousness.  He went to [place] and since back from [place] he is feeling more sad and depressed.  His biggest concern is waking up in the morning and then remain sad depressed and usually as the days go he feels better but lately he is not feeling good.  Though he denies any suicidal thoughts, crying spells or any feeling of hopelessness but remained very anxious.  He has upcoming trip to [place] this Friday for 8 days and he is nervous about traveling.  His last flight was more than 9 months ago.  He is going for his grandson graduation.  He reported he medication with his son who lives in [place] is much improved and that should not be concerned about he is not sure what is causing the anxiety.  He had taken few days ago to Ativan together that helps somewhat.  He sleeps okay but wakes up early in the morning.  Denies any suicidal thoughts.  His energy level is fair.  He had not played golf for past few days but that was due to conflict of schedule.  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  His appetite is decreased but his weight remains unchanged from the past.  He denies any hallucination, paranoia or any agitation.  His attention concentration is okay.   He denies any major panic attack.  He is not sure if he consider going back to [place].  No new medication added.  Recently he was given Ativan refill from the covering physician but he is concerned that he may need more for the trip.  He does not drink or use any illegal substances.  He takes the Ativan sporadically.   Past Psychiatric History: Reviewed. H/O depression and anxiety most of his life.  Had a good response with TMS and Lexapro. Tried Celexa, Pristiq, Remeron, Cymbalta and Xanax but limited outcome. Klonopin and Buspar made groggy.  No h/o suicidal attempt, mania, psychosis or self abusive behavior.  Saw Dr. Cheryln Manly in the past for therapy.    Psychiatric Specialty Exam: Physical Exam  Review of Systems  Weight 140 lb (63.5 kg).There is no height or weight on file to calculate BMI.  General Appearance: NA  Eye Contact:  NA  Speech:  Normal Rate  Volume:  Normal  Mood:  Anxious  Affect:  NA  Thought Process:  Goal Directed  Orientation:  Full (Time, Place, and Person)  Thought Content:  Rumination  Suicidal Thoughts:  No  Homicidal Thoughts:  No  Memory:  Immediate;   Good Recent;   Good Remote;   Good  Judgement:  Intact  Insight:  Present  Psychomotor Activity:  NA  Concentration:  Concentration: Good and Attention Span: Good  Recall:  Good  Fund of Knowledge:  Good  Language:  Good  Akathisia:  No  Handed:  Right  AIMS (if indicated):     Assets:  Communication Skills Desire for Improvement Housing  ADL's:  Intact  Cognition:  WNL  Sleep:   fair      Assessment and Plan: Major depressive disorder, recurrent.  Anxiety.  Discussed upcoming trip to [place] causing anxiety.  He is not sure if the traveling or visiting to [place] has triggered his anxiety.  His communication with the son has improved recently.  I recommend to take the Ativan 2 tablet as needed to help his anxiety.  We will provide a new prescription of Ativan with new directions.  I  discussed not advised to change the medication since he is going to [place] on Friday.  However we agreed that he will make an appointment when he comes back from [place] and symptoms do not improve we will consider refresher [place].  Continue Lexapro 30 mg daily.  Discussed safety concerns at any time having active suicidal thoughts or homicidal thought that he need to call 911 or go to local emergency room.  Follow-up when patient returned from [place].  Follow Up Instructions:    I discussed the assessment and treatment plan with the patient. The patient was provided an opportunity to ask questions and all were answered. The patient agreed with the plan and demonstrated an understanding of the instructions.   The patient was advised to call back or seek an in-person evaluation if the symptoms worsen or if the condition fails to improve as anticipated.  I provided 22 minutes of non-face-to-face time during this encounter.   Kathlee Nations, MD

## 2022-04-20 ENCOUNTER — Telehealth (HOSPITAL_COMMUNITY): Payer: Self-pay | Admitting: *Deleted

## 2022-04-20 NOTE — Telephone Encounter (Signed)
I will forward my note to Brigitte Pulse who can help him to restart [facility] treatment.  If there is any issue then we might need to refer him out for [facility] treatment.

## 2022-04-20 NOTE — Telephone Encounter (Signed)
NOT STARTING UP AGAIN UNTIL MID JULY.

## 2022-04-20 NOTE — Telephone Encounter (Signed)
Pt called today stating that he is not doing well and he would like to try [facility] again. Writer waiting for [facility] coordinator so referral may be made. Pt sees you next on 05/31/22, would you also like me to see if front desk can possibly make an earlier appointment?

## 2022-04-21 ENCOUNTER — Telehealth (HOSPITAL_COMMUNITY): Payer: Self-pay | Admitting: *Deleted

## 2022-04-21 NOTE — Telephone Encounter (Signed)
Writer spoke with pt again regarding [name]. Pt states that he fine to wait until he can be scheduled for July. Pt expresses despair, increased depression and baseline anxiety. Pt adamantly denies any SI or thoughts of self harm. Pt is using the Lorazepam and will call for refill. Pt encouraged to call office with any questions of concerns or if situation is exacerbated. Pt agrees.

## 2022-05-04 ENCOUNTER — Ambulatory Visit: Payer: Medicare Other | Admitting: Student

## 2022-05-04 ENCOUNTER — Encounter: Payer: Self-pay | Admitting: Student

## 2022-05-04 VITALS — BP 117/65 | HR 71 | Temp 98.6°F | Resp 16 | Ht 67.0 in | Wt 141.2 lb

## 2022-05-04 DIAGNOSIS — I351 Nonrheumatic aortic (valve) insufficiency: Secondary | ICD-10-CM | POA: Diagnosis not present

## 2022-05-04 DIAGNOSIS — I10 Essential (primary) hypertension: Secondary | ICD-10-CM

## 2022-05-04 DIAGNOSIS — I6523 Occlusion and stenosis of bilateral carotid arteries: Secondary | ICD-10-CM

## 2022-05-04 MED ORDER — ASPIRIN 81 MG PO TBEC: 81.0000 mg | DELAYED_RELEASE_TABLET | Freq: Every day | ORAL | 3 refills | Status: AC

## 2022-05-04 NOTE — Progress Notes (Signed)
Primary Physician/Referring:  [name], MD  Patient ID: LOVELL NUTTALL, male    DOB: April 22, 1936, 87 y.o.   MRN: [id number]  Chief Complaint  Patient presents with   Hypertension   Primary prevention    6 weeks   HPI:    HAIDER HORNADAY  is a 86 y.o. male with History of hypertension, hyperlipidemia, prediabetes, mild sleep apnea (not on CPAP), GERD, mild hyponatremia.  He was originally referred to our office by PCP at patient's request for evaluation and management of hypertension as well as to "establish care".  Patient was last seen in the office 02/16/2022 at which time blood pressure was well controlled, however given mildly elevated LDL started low-dose of statin therapy and ordered echocardiogram as well as carotid artery duplex.  Echocardiogram revealed preserved LVEF with mildly dilated LA, moderate AR, mild MR and TR.  Carotid artery duplex noted bilateral internal carotid stenosis, will follow-up in 6 months. Patient now presents for follow up.  Patient is feeling well overall without specific complaints today.  He remains fairly active walking and golfing on a regular basis without issue.  Past Medical History:  Diagnosis Date   Anxiety    BPH (benign prostatic hypertrophy)    DDD (degenerative disc disease), lumbar 2002   Depression    Difficult intubation 02/22/2019   Anterior with limited oral opening   Essential hypertension 03/10/2019   GERD (gastroesophageal reflux disease)    History of colon polyps    History of hiatal hernia 2012   Hyperlipemia    Mild   Indwelling Foley catheter present    Iron deficiency anemia    IV iron therapy    Mild hyperlipidemia 02/20/2008   Qualifier: Diagnosis of  By: Leanne Chang MD, Bruce     Pre-diabetes    Renal cyst 2008   7.6 cm lower pole right renal cyst   Sleep apnea    slight, no CPAP used   Past Surgical History:  Procedure Laterality Date   COLONOSCOPY  08/2011   NASAL SEPTOPLASTY W/ TURBINOPLASTY  08/03/2012    Procedure: NASAL SEPTOPLASTY WITH TURBINATE REDUCTION;  Surgeon: Izora Gala, MD;  Location: [facility];  Service: ENT;  Laterality: Bilateral;   UPPER GASTROINTESTINAL ENDOSCOPY  08/2011   XI ROBOTIC ASSISTED SIMPLE PROSTATECTOMY N/A 02/22/2019   Procedure: XI ROBOTIC ASSISTED SIMPLE PROSTATECTOMY;  Surgeon: Alexis Frock, MD;  Location[facility];  Service: Urology;  Laterality: N/A;   Family History  Problem Relation Age of Onset   Heart attack Father    Hypertension Father    Hypertension Daughter    Hypertension Brother    Hypertension Brother    Hypertension Sister     Social History   Tobacco Use   Smoking status: Former    Packs/day: 0.50    Years: 10.00    Total pack years: 5.00    Types: Cigarettes    Quit date: 1969    Years since quitting: 54.5   Smokeless tobacco: Never   Tobacco comments:    rare use over 50 years   Substance Use Topics   Alcohol use: Yes    Alcohol/week: 1.0 standard drink of alcohol    Types: 1 Shots of liquor per week    Comment: weekly   Marital Status: Married   ROS  Review of Systems  Constitutional: Negative for malaise/fatigue and weight gain.  Cardiovascular:  Negative for chest pain, claudication, dyspnea on exertion, leg swelling, near-syncope, orthopnea, palpitations, paroxysmal nocturnal dyspnea  and syncope.  Neurological:  Negative for dizziness.    Objective  Blood pressure 117/65, pulse 71, temperature 98.6 F (37 C), temperature source Temporal, resp. rate 16, height '5\' 7"'$  (1.702 m), weight 141 lb 3.2 oz (64 kg), SpO2 97 %.     05/04/2022   11:06 AM 04/05/2022    8:57 AM 03/01/2022    9:57 AM  Vitals with BMI  Height '5\' 7"'$     Weight 141 lbs 3 oz    BMI 81.82    Systolic 993    Diastolic 65    Pulse 71       Information is confidential and restricted. Go to Review Flowsheets to unlock data.    Physical Exam Vitals reviewed.  Cardiovascular:     Rate and Rhythm: Normal rate and regular rhythm.     Pulses: Intact distal  pulses.     Heart sounds: S1 normal and S2 normal. Murmur heard.     Blowing holosystolic murmur is present at the apex.     Diastolic murmur is present.     No gallop.  Pulmonary:     Effort: Pulmonary effort is normal. No respiratory distress.     Breath sounds: No wheezing, rhonchi or rales.  Musculoskeletal:     Right lower leg: No edema.     Left lower leg: No edema.  Neurological:     Mental Status: He is alert.    Laboratory examination:   Recent Labs    10/12/21 1603  NA 132*  K 4.0  CL 99  CO2 27  GLUCOSE 91  BUN 20  CREATININE 0.78  CALCIUM 8.9   CrCl cannot be calculated (Patient's most recent lab result is older than the maximum 21 days allowed.).     Latest Ref Rng & Units 10/12/2021    4:03 PM 02/17/2021   11:02 AM 12/12/2019    9:33 AM  CMP  Glucose 70 - 99 mg/dL 91  101  104   BUN 6 - 23 mg/dL '20  14  18   '$ Creatinine 0.40 - 1.50 mg/dL 0.78  0.77  0.87   Sodium 135 - 145 mEq/L 132  134  138   Potassium 3.5 - 5.1 mEq/L 4.0  4.6  4.5   Chloride 96 - 112 mEq/L 99  100  103   CO2 19 - 32 mEq/L '27  27  28   '$ Calcium 8.4 - 10.5 mg/dL 8.9  8.8  8.9   Total Protein 6.0 - 8.3 g/dL  6.6  6.6   Total Bilirubin 0.2 - 1.2 mg/dL  0.6  0.4   Alkaline Phos 39 - 117 U/L  56  50   AST 0 - 37 U/L  14  13   ALT 0 - 53 U/L  15  15       Latest Ref Rng & Units 02/17/2021   11:02 AM 12/12/2019    9:33 AM 09/25/2019   11:02 AM  CBC  WBC 4.0 - 10.5 K/uL 5.8  5.5  6.2   Hemoglobin 13.0 - 17.0 g/dL 12.8  13.1  12.4   Hematocrit 39.0 - 52.0 % 39.6  40.9  38.7   Platelets 150.0 - 400.0 K/uL 213.0  197.0  255     Lipid Panel No results for input(s): "CHOL", "TRIG", "LDLCALC", "VLDL", "HDL", "CHOLHDL", "LDLDIRECT" in the last 8760 hours.   HEMOGLOBIN A1C Lab Results  Component Value Date   HGBA1C 6.1 12/12/2019   MPG 116.89 02/20/2019  TSH No results for input(s): "TSH" in the last 8760 hours.   External labs:  None   Allergies   Allergies  Allergen  Reactions   Dexlansoprazole     Other reaction(s): Unknown    Medications Prior to Visit:   Outpatient Medications Prior to Visit  Medication Sig Dispense Refill   acetaminophen (TYLENOL) 500 MG tablet Take 1,000 mg by mouth every 6 (six) hours as needed for moderate pain or headache.     amLODipine (NORVASC) 10 MG tablet Take 1 tablet (10 mg total) by mouth daily. 90 tablet 3   atorvastatin (LIPITOR) 10 MG tablet Take 1 tablet (10 mg total) by mouth daily. 90 tablet 3   clobetasol cream (TEMOVATE) 2.70 % Apply 1 application  topically 2 (two) times daily.     escitalopram (LEXAPRO) 20 MG tablet Take one and 1/2 tab daily 45 tablet 2   finasteride (PROSCAR) 5 MG tablet Take 5 mg by mouth daily.     hydrocortisone 2.5 % cream Apply topically 2 (two) times daily as needed. (Patient taking differently: Apply 1 application  topically 2 (two) times daily as needed (rash).) 30 g 1   LORazepam (ATIVAN) 0.5 MG tablet Take 1 tablet (0.5 mg total) by mouth 2 (two) times daily as needed for anxiety. 45 tablet 0   omeprazole (PRILOSEC) 40 MG capsule TAKE 1 CAPSULE BY MOUTH  DAILY 90 capsule 3   valsartan (DIOVAN) 160 MG tablet Take 1 tablet (160 mg total) by mouth daily. 90 tablet 3   cyanocobalamin 2000 MCG tablet Take 2,000 mcg by mouth daily. (Patient not taking: Reported on 02/16/2022)     No facility-administered medications prior to visit.   Final Medications at End of Visit    Current Meds  Medication Sig   acetaminophen (TYLENOL) 500 MG tablet Take 1,000 mg by mouth every 6 (six) hours as needed for moderate pain or headache.   amLODipine (NORVASC) 10 MG tablet Take 1 tablet (10 mg total) by mouth daily.   aspirin EC 81 MG tablet Take 1 tablet (81 mg total) by mouth daily. Swallow whole.   atorvastatin (LIPITOR) 10 MG tablet Take 1 tablet (10 mg total) by mouth daily.   clobetasol cream (TEMOVATE) 6.23 % Apply 1 application  topically 2 (two) times daily.   escitalopram (LEXAPRO) 20 MG  tablet Take one and 1/2 tab daily   finasteride (PROSCAR) 5 MG tablet Take 5 mg by mouth daily.   hydrocortisone 2.5 % cream Apply topically 2 (two) times daily as needed. (Patient taking differently: Apply 1 application  topically 2 (two) times daily as needed (rash).)   LORazepam (ATIVAN) 0.5 MG tablet Take 1 tablet (0.5 mg total) by mouth 2 (two) times daily as needed for anxiety.   omeprazole (PRILOSEC) 40 MG capsule TAKE 1 CAPSULE BY MOUTH  DAILY   valsartan (DIOVAN) 160 MG tablet Take 1 tablet (160 mg total) by mouth daily.   Radiology:   No results found.  Cardiac Studies:   Carotid artery duplex Mar 09, 2022:  Duplex suggests stenosis in the right internal carotid artery (50-69%).  Duplex suggests stenosis in the left internal carotid artery (1-15%).  Bilateral ICA has tortuous anatomy and could be a source of bruit.  There  is mild diffuse homogenous plaque in bilateral ICA.  Antegrade right vertebral artery flow. Antegrade left vertebral artery flow.  Follow up in six months is appropriate if clinically indicated.  PCV ECHOCARDIOGRAM COMPLETE 76/28/3151 Normal LV systolic function with EF  71%. Left ventricle cavity is normal in size. Normal left ventricular wall thickness. Normal global wall motion. Normal diastolic filling pattern. Calculated EF 71%. Left atrial cavity is mildly dilated at 4.1 cm. Mild aortic valve leaflet thickening with mild calcification. Trileaflet aortic valve. No evidence of aortic stenosis. Moderate (Grade II) aortic regurgitation. Mildly restricted aortic valve leaflets. Structurally normal mitral valve.  Mild (Grade I) mitral regurgitation. Structurally normal tricuspid valve.  Mild tricuspid regurgitation. No evidence of pulmonary hypertension. RVSP measures 30 mmHg. The aortic root is normal. Borderline dilated ascending aorta at 3.9 cm.   EKG:   02/16/2022: Sinus rhythm at a rate of 61 bpm.  Normal axis.  No evidence of ischemia or underlying injury  pattern.  Nonspecific T wave abnormality.  Assessment     ICD-10-CM   1. Moderate aortic regurgitation  I35.1     2. Essential hypertension  I10     3. Bilateral carotid artery stenosis  I65.23 PCV CAROTID DUPLEX (BILATERAL)       Medications Discontinued During This Encounter  Medication Reason   cyanocobalamin 2000 MCG tablet     Meds ordered this encounter  Medications   aspirin EC 81 MG tablet    Sig: Take 1 tablet (81 mg total) by mouth daily. Swallow whole.    Dispense:  90 tablet    Refill:  3    Recommendations:   ALEXANDRU MOORER is a 86 y.o. male with History of hypertension, hyperlipidemia, prediabetes, sleep apnea (not on CPAP), GERD, mild hyponatremia.  He was originally referred to our office by PCP at patient's request for evaluation and management of hypertension as well as to "establish care".  Patient was last seen in the office 02/16/2022 at which time blood pressure was well controlled, however given mildly elevated LDL started low-dose of statin therapy and ordered echocardiogram as well as carotid artery duplex.  Echocardiogram revealed preserved LVEF with mildly dilated LA, moderate AR, mild MR and TR.  Carotid artery duplex noted bilateral internal carotid stenosis, will follow-up in 6 months. Patient now presents for follow up.  Reviewed and discussed results of echocardiogram and carotid artery duplex, details above.  Given carotid artery stenosis we will continue statin therapy as well as add aspirin 81 mg daily.  Patient is otherwise stable from a cardiovascular standpoint.  Follow-up in 6 months, sooner if needed after repeat carotid artery scan.   Alethia Berthold, PA-C 05/04/2022, 11:22 AM Office: [phone]

## 2022-05-17 ENCOUNTER — Ambulatory Visit (INDEPENDENT_AMBULATORY_CARE_PROVIDER_SITE_OTHER): Payer: Medicare Other | Admitting: Student in an Organized Health Care Education/Training Program

## 2022-05-17 DIAGNOSIS — F332 Major depressive disorder, recurrent severe without psychotic features: Secondary | ICD-10-CM | POA: Diagnosis not present

## 2022-05-17 DIAGNOSIS — F419 Anxiety disorder, unspecified: Secondary | ICD-10-CM

## 2022-05-17 NOTE — Progress Notes (Signed)
[facility] MD/PA/NP OP Progress Note  05/17/2022 4:26 PM [name]  MRN:  [id number]  Chief Complaint: No chief complaint on file.  HPI:  [name] is an 86 yr old male who presents for evaluation for [place].  PPHx is significant for life long depression and anxiety, he has had TMS twice before and responded well to it.  He reports that the last 2 to 3 months his symptoms have significantly worsened.  He states he has trouble getting out of bed almost every day and does not leave the house most days because of it.  He states that he has been talking with his primary psychiatrist Dr. Adele Schilder about undergoing [place] treatment again and so would like to do so again.  He reports that the last 2 times he is received [place] and has been successful in significantly reducing his symptoms so that he can function again.  He reports never having any significant side effects from it.  He reports no history of seizures.  He reports no surgeries or medical implantation in his head.  Discussed with him that [place] would be appropriate again and he is willing to be scheduled for it again.  He reports no SI, HI, or AVH.   Visit Diagnosis:    ICD-10-CM   1. Major depressive disorder, recurrent, severe without psychotic features ([place])  F33.2     2. Anxiety  F41.9       Past Psychiatric History: H/O depression and anxiety most of his life.  Had a good response with TMS x2 and Lexapro.  Tried Celexa, Pristiq, Remeron, Cymbalta and Xanax but limited outcome. Klonopin and Buspar made groggy.  No h/o suicidal attempt, mania, psychosis or self abusive behavior.  Saw Dr. Cheryln Manly in the past for therapy.    Past Medical History:  Past Medical History:  Diagnosis Date   Anxiety    BPH (benign prostatic hypertrophy)    DDD (degenerative disc disease), lumbar 2002   Depression    Difficult intubation 02/22/2019   Anterior with limited oral opening   Essential hypertension 03/10/2019   GERD (gastroesophageal reflux disease)     History of colon polyps    History of hiatal hernia 2012   Hyperlipemia    Mild   Indwelling Foley catheter present    Iron deficiency anemia    IV iron therapy    Mild hyperlipidemia 02/20/2008   Qualifier: Diagnosis of  By: Leanne Chang MD, Bruce     Pre-diabetes    Renal cyst 2008   7.6 cm lower pole right renal cyst   Sleep apnea    slight, no CPAP used    Past Surgical History:  Procedure Laterality Date   COLONOSCOPY  08/2011   NASAL SEPTOPLASTY W/ TURBINOPLASTY  08/03/2012   Procedure: NASAL SEPTOPLASTY WITH TURBINATE REDUCTION;  Surgeon: Izora Gala, MD;  Location: [facility];  Service: ENT;  Laterality: Bilateral;   UPPER GASTROINTESTINAL ENDOSCOPY  08/2011   XI ROBOTIC ASSISTED SIMPLE PROSTATECTOMY N/A 02/22/2019   Procedure: XI ROBOTIC ASSISTED SIMPLE PROSTATECTOMY;  Surgeon: Alexis Frock, MD;  [facility];  Service: Urology;  Laterality: N/A;    Family Psychiatric History: None  Family History:  Family History  Problem Relation Age of Onset   Heart attack Father    Hypertension Father    Hypertension Daughter    Hypertension Brother    Hypertension Brother    Hypertension Sister     Social History:  Social History   Socioeconomic History  Marital status: Married    Spouse name: Not on file   Number of children: 2   Years of education: Not on file   Highest education level: Not on file  Occupational History   Not on file  Tobacco Use   Smoking status: Former    Packs/day: 0.50    Years: 10.00    Total pack years: 5.00    Types: Cigarettes    Quit date: 82    Years since quitting: 54.5   Smokeless tobacco: Never   Tobacco comments:    rare use over 50 years   Vaping Use   Vaping Use: Never used  Substance and Sexual Activity   Alcohol use: Yes    Alcohol/week: 1.0 standard drink of alcohol    Types: 1 Shots of liquor per week    Comment: weekly   Drug use: No   Sexual activity: Never    Birth control/protection: None  Other Topics  Concern   Not on file  Social History Narrative   Lives in 2 level    Will stay for now    Just moved here approx 1.5 yo   Very nice neighbors    [employer]  x 5 with a very busy holiday   One son is a Teacher, music and nephew is a Investment banker, operational    Son in [place] moved to [place] and he misses his grand children    As 2 brothers in [place]       Retired from [employer], [employer]       Social Determinants of Health   Financial Resource Strain: [place]  (11/23/2021)   Overall Financial Resource Strain (CARDIA)    Difficulty of Paying Living Expenses: Not hard at all  Food Insecurity: No [place] (11/23/2021)   Hunger Vital Sign    Worried About Running Out of Food in the Last Year: Never true    [place] in the Last Year: Never true  Transportation Needs: No Transportation Needs (11/23/2021)   PRAPARE - Hydrologist (Medical): No    Lack of Transportation (Non-Medical): No  Physical Activity: Sufficiently Active (11/23/2021)   Exercise Vital Sign    Days of Exercise per Week: 4 days    Minutes of Exercise per Session: 40 min  Stress: No Stress Concern Present (11/23/2021)   [place]    Feeling of Stress : Only a little  Social Connections: Moderately Isolated (11/23/2021)   Social Connection and Isolation Panel [NHANES]    Frequency of Communication with Friends and Family: More than three times a week    Frequency of Social Gatherings with Friends and Family: More than three times a week    Attends Religious Services: Never    Marine scientist or Organizations: No    Attends Archivist Meetings: Never    Marital Status: Married    Allergies:  Allergies  Allergen Reactions   Dexlansoprazole     Other reaction(s): Unknown    Metabolic Disorder Labs: Lab Results  Component Value Date   HGBA1C 6.1 12/12/2019   MPG 116.89 02/20/2019   No results found for:  "PROLACTIN" Lab Results  Component Value Date   CHOL 194 02/17/2021   TRIG 55.0 02/17/2021   HDL 71.60 02/17/2021   CHOLHDL 3 02/17/2021   VLDL 11.0 02/17/2021   LDLCALC 112 (H) 02/17/2021   LDLCALC 115 (H) 12/12/2019   Lab  Results  Component Value Date   TSH 3.02 02/17/2021   TSH 3.28 12/12/2019    Therapeutic Level Labs: No results found for: "LITHIUM" No results found for: "VALPROATE" No results found for: "CBMZ"  Current Medications: Current Outpatient Medications  Medication Sig Dispense Refill   acetaminophen (TYLENOL) 500 MG tablet Take 1,000 mg by mouth every 6 (six) hours as needed for moderate pain or headache.     amLODipine (NORVASC) 10 MG tablet Take 1 tablet (10 mg total) by mouth daily. 90 tablet 3   aspirin EC 81 MG tablet Take 1 tablet (81 mg total) by mouth daily. Swallow whole. 90 tablet 3   atorvastatin (LIPITOR) 10 MG tablet Take 1 tablet (10 mg total) by mouth daily. 90 tablet 3   clobetasol cream (TEMOVATE) 1.19 % Apply 1 application  topically 2 (two) times daily.     escitalopram (LEXAPRO) 20 MG tablet Take one and 1/2 tab daily 45 tablet 2   finasteride (PROSCAR) 5 MG tablet Take 5 mg by mouth daily.     hydrocortisone 2.5 % cream Apply topically 2 (two) times daily as needed. (Patient taking differently: Apply 1 application  topically 2 (two) times daily as needed (rash).) 30 g 1   LORazepam (ATIVAN) 0.5 MG tablet Take 1 tablet (0.5 mg total) by mouth 2 (two) times daily as needed for anxiety. 45 tablet 0   omeprazole (PRILOSEC) 40 MG capsule TAKE 1 CAPSULE BY MOUTH  DAILY 90 capsule 3   valsartan (DIOVAN) 160 MG tablet Take 1 tablet (160 mg total) by mouth daily. 90 tablet 3   No current facility-administered medications for this visit.     Musculoskeletal: Strength & Muscle Tone: within normal limits Gait & Station: normal Patient leans: N/A  Psychiatric Specialty Exam: Review of Systems  Gastrointestinal:  Negative for abdominal pain,  constipation, diarrhea, nausea and vomiting.  Neurological:  Negative for dizziness, weakness and headaches.  Psychiatric/Behavioral:  Positive for dysphoric mood and sleep disturbance. Negative for agitation, hallucinations, self-injury and suicidal ideas. The patient is nervous/anxious. The patient is not hyperactive.     There were no vitals taken for this visit.There is no height or weight on file to calculate BMI.  General Appearance: Casual  Eye Contact:  Poor  Speech:  Clear and Coherent and Slow  Volume:  Normal  Mood:  Depressed  Affect:  Congruent and Depressed  Thought Process:  Coherent and Goal Directed  Orientation:  Full (Time, Place, and Person)  Thought Content: Logical   Suicidal Thoughts:  No  Homicidal Thoughts:  No  Memory:  Immediate;   Fair Recent;   Fair  Judgement:  Fair  Insight:  Fair  Psychomotor Activity:  Decreased  Concentration:  Concentration: Fair and Attention Span: Fair  Recall:  Good  Fund of Knowledge: Fair  Language: Good  Akathisia:  Negative  Handed:  Right  AIMS (if indicated): not done  Assets:  Communication Skills Desire for Improvement Resilience Social Support  ADL's:  Intact  Cognition: WNL  Sleep:  Fair   Screenings: AUDIT    Flowsheet Row Clinical Support from 12/14/2020 in [facility] at [facility]  Alcohol Use Disorder Identification Test Final Score (AUDIT) 3      GAD-7    Flowsheet Row Office Visit from 05/17/2022 in [facility] from 05/21/2021 in [facility] from 04/27/2021 in [facility]  Total GAD-7 Score 12 1 2  [facility] Office Visit from 05/17/2022 in [facility] from 11/23/2021 in [facility] from 10/12/2021 in [facility] Video Visit from 09/08/2021 in [facility] Office Visit from 07/22/2021 in [facility]  PHQ-2 Total Score '6 1 2 '$ 0 0  PHQ-9 Total Score '23 1 2 '$ -- --      Flowsheet Row Video Visit from 11/30/2021 in [facility] Video Visit from 09/08/2021 in [facility] Office Visit from 07/22/2021 in [facility] No Risk Error: Q3, 4, or 5 should not be populated when Q2 is No Error: Q3, 4, or 5 should not be populated when Q2 is No        Assessment and Plan:  Cuauhtemoc Huegel is an 86 yr old male who presents for evaluation for [place].  PPHx is significant for life long depression and anxiety, he has had TMS twice before and responded well to it.  [name] has a PHQ-9 score of 23 and a GAD-7 score of 12.  Given his symptoms are worsening and significantly impacting his life and his significant response to [place] in the past he is appropriate for undergoing [place] again.  We will obtain Pre authorization and schedule him when able.  He will continue taking Lexapro 30 mg daily and Ativan 0.5 mg BID PRN.    Collaboration of Care: Case Discussed with Supervising Physician Dr. Dwyane Dee.  Patient/Guardian was advised Release of Information must be obtained prior to any record release in order to collaborate their care with an outside provider. Patient/Guardian was advised if they have not already done so to contact the registration department to sign all necessary forms in order for [name] to release information regarding their care.   Consent: Patient/Guardian gives verbal consent for treatment and assignment of benefits for services provided during this visit. Patient/Guardian expressed understanding and agreed to proceed.    Briant Cedar, MD 05/17/2022, 4:26 PM

## 2022-05-18 ENCOUNTER — Telehealth (HOSPITAL_COMMUNITY): Payer: Self-pay | Admitting: Psychiatry

## 2022-05-18 NOTE — Telephone Encounter (Signed)
D:  Dr. Adele Schilder referred patient to restart [place].  A:  Placed call to insurance company [facility]).  Had to submit a form within provider express.  Rep states the turn around could be up to ten days.  Dr. Dwyane Dee requested that the case manager reach out to the patient and encourage him to contact his insurance company to possibly speed up the pre-auth process so pt can start treatments quickly d/t worsening depression.  Placed call to pt, but there was no answer; left vm requesting pt to contact his insurance company.  Informed Beather Arbour, RN (clinical mgr) and Debarah Crape, RN.

## 2022-05-19 ENCOUNTER — Telehealth (HOSPITAL_COMMUNITY): Payer: Self-pay | Admitting: Psychiatry

## 2022-05-19 NOTE — Telephone Encounter (Signed)
D:  Placed call to inform pt that his [place] appointment would have to be cancelled today d/t not getting back authorization from [facility] as of yet.  A:  Provided pt with support.  Strongly encouraged pt to contact his insurance company to assist with pushing the authorization through and the case manager will do the same on her end.  Informed pt that the case manager will call him and schedule the first appt as soon as authorization is in place.  Inform Beather Arbour, RN (clinical manager) Debarah Crape, RN and Dr. Adele Schilder.  R:  Pt receptive.

## 2022-05-20 ENCOUNTER — Telehealth (HOSPITAL_COMMUNITY): Payer: Self-pay | Admitting: *Deleted

## 2022-05-20 DIAGNOSIS — F419 Anxiety disorder, unspecified: Secondary | ICD-10-CM

## 2022-05-20 MED ORDER — LORAZEPAM 0.5 MG PO TABS: 0.5000 mg | ORAL_TABLET | Freq: Two times a day (BID) | ORAL | 0 refills | Status: DC | PRN

## 2022-05-20 NOTE — Telephone Encounter (Signed)
Done

## 2022-05-20 NOTE — Telephone Encounter (Signed)
Pt called requesting refill of the Ativan 0.5 mg bid prn. Last filled 04/05/22 for #45. Pt is requesting refill be sent today before weekend. Next scheduled appointment is 05/31/22. Please review and advise.

## 2022-05-26 ENCOUNTER — Telehealth (HOSPITAL_COMMUNITY): Payer: Self-pay | Admitting: Psychiatry

## 2022-05-26 NOTE — Telephone Encounter (Signed)
D:  Placed call to inform pt that [place] was approved by his insurance company today.  Informed pt that the interim coordinator is awaiting word from Debarah Crape, RN as to when he will get started with pt's treatments.  A:  Provided pt with support.  Inform Dr. Adele Schilder.  R:  Pt receptive.

## 2022-05-26 NOTE — Telephone Encounter (Signed)
D:  Pt's insurance company called and left vm stating pt is authorized to begin [place].  According to Roxie with [employer], pt was authorized #36 units with auth #[id number] and #6 units with auth#[id number] both from 05-18-22 thru 11-18-22.  A:  Inform [facility] team, Beather Arbour, RN (clinical manager, Dr. Adele Schilder and Therisa Doyne (patient billing).

## 2022-05-27 ENCOUNTER — Telehealth (HOSPITAL_COMMUNITY): Payer: Self-pay | Admitting: Psychiatry

## 2022-05-27 NOTE — Telephone Encounter (Signed)
D:  Pt is scheduled for his first MT ([place] tx) on 05-30-22 @ 7 a.m..  A:  Placed call to inform pt.  R:  Pt receptive.

## 2022-05-30 ENCOUNTER — Other Ambulatory Visit (HOSPITAL_COMMUNITY): Payer: Medicare Other | Attending: Psychiatry | Admitting: *Deleted

## 2022-05-30 DIAGNOSIS — F332 Major depressive disorder, recurrent severe without psychotic features: Secondary | ICD-10-CM

## 2022-05-30 NOTE — Progress Notes (Unsigned)
Pt reported to [facility] for his 3rd round of cortical mapping and motor threshold determination for Repetitive Transcranial Magnetic Stimulation treatment for Major Depressive Disorder. Pt completed a PHQ-9 with a score of 16.  Prior to procedure, pt signed an informed consent agreement for [place] treatment. Pt's treatment area was found by applying single pulses to her left motor cortex, hunting along the anterior/posterior plane and along the superior oblique angle until the best motor response was elicited from the pt's right thumb. Using the [employer]'s proprietary MT Assist algorithm, which produced a calculated motor threshold of 1.28 SMT. Per these findings, pt's treatment parameter. With these parameters, the pt will receive 36 sessions of TMS according to the following protocol: 3000 pulses per session. After determining pt's tx parameters, coil was moved to the treatment location, and the first burst of pulses was applied at a reduced power of 90% MT. Pt was able to tolerate titration to 120% MT by the end of treatment. Pt reported no complaints, and stated that the stimulation was tolerable. Upon completion of mapping, pt completed a few treatment intervals for observation of side effects. Pt tolerated tx well. Pt departed from clinic without issue. Dr. Kai Levins completed cortical mapping.

## 2022-05-31 ENCOUNTER — Telehealth (HOSPITAL_COMMUNITY): Payer: Medicare Other | Admitting: Psychiatry

## 2022-05-31 ENCOUNTER — Other Ambulatory Visit (HOSPITAL_COMMUNITY): Payer: Medicare Other | Attending: Psychiatry | Admitting: *Deleted

## 2022-05-31 DIAGNOSIS — F332 Major depressive disorder, recurrent severe without psychotic features: Secondary | ICD-10-CM

## 2022-05-31 NOTE — Progress Notes (Signed)
Patient reported to [facility] for Repetitive Transcranial Magnetic Stimulation treatment for severe episode of recurrent major depressive disorder, without psychotic features. Patient presented with appropriate affect, level mood and denied any suicidal or homicidal ideations. Patient denies any other current symptoms and remains optimistic with continued [place] treatment. Patient reported no change in alcohol/substance use, caffeine consumption, sleep pattern or metal implant status since previous tx. Pt sat quietly with his eyes closed during the treatment.  Power was titrated to 120% for the duration of tx and will remain there until patient gets adjusted. Patient reported no complaints or discomfort. Patient departed post-treatment with no concerns or complaints.

## 2022-06-01 ENCOUNTER — Other Ambulatory Visit (HOSPITAL_BASED_OUTPATIENT_CLINIC_OR_DEPARTMENT_OTHER): Payer: Medicare Other | Admitting: *Deleted

## 2022-06-01 ENCOUNTER — Telehealth (HOSPITAL_BASED_OUTPATIENT_CLINIC_OR_DEPARTMENT_OTHER): Payer: Medicare Other | Admitting: Psychiatry

## 2022-06-01 ENCOUNTER — Encounter (HOSPITAL_COMMUNITY): Payer: Self-pay | Admitting: Psychiatry

## 2022-06-01 ENCOUNTER — Ambulatory Visit (INDEPENDENT_AMBULATORY_CARE_PROVIDER_SITE_OTHER): Payer: Medicare Other | Admitting: Family Medicine

## 2022-06-01 ENCOUNTER — Encounter: Payer: Self-pay | Admitting: Family Medicine

## 2022-06-01 VITALS — BP 124/66 | HR 65 | Temp 97.9°F | Ht 67.0 in | Wt 139.8 lb

## 2022-06-01 DIAGNOSIS — E785 Hyperlipidemia, unspecified: Secondary | ICD-10-CM

## 2022-06-01 DIAGNOSIS — F419 Anxiety disorder, unspecified: Secondary | ICD-10-CM

## 2022-06-01 DIAGNOSIS — R739 Hyperglycemia, unspecified: Secondary | ICD-10-CM | POA: Diagnosis not present

## 2022-06-01 DIAGNOSIS — M25472 Effusion, left ankle: Secondary | ICD-10-CM | POA: Diagnosis not present

## 2022-06-01 DIAGNOSIS — I10 Essential (primary) hypertension: Secondary | ICD-10-CM

## 2022-06-01 DIAGNOSIS — M25471 Effusion, right ankle: Secondary | ICD-10-CM

## 2022-06-01 DIAGNOSIS — F332 Major depressive disorder, recurrent severe without psychotic features: Secondary | ICD-10-CM

## 2022-06-01 MED ORDER — ESCITALOPRAM OXALATE 20 MG PO TABS: ORAL_TABLET | ORAL | 2 refills | Status: DC

## 2022-06-01 NOTE — Progress Notes (Signed)
Patient reported to [facility] for Repetitive Transcranial Magnetic Stimulation treatment for severe episode of recurrent major depressive disorder, without psychotic features. Patient presented with appropriate affect, level mood and denied any suicidal or homicidal ideations. Patient denies any other current symptoms and remains optimistic with continued [place] treatment. Patient reported no change in alcohol/substance use, caffeine consumption, sleep pattern or metal implant status since previous tx. Pt sat quietly with his eyes closed during the treatment.  Power was titrated to 120% for the duration of tx and will remain there until patient gets adjusted. Patient reported no complaints or discomfort. Patient departed post-treatment with no concerns or complaints.

## 2022-06-01 NOTE — Progress Notes (Signed)
Established Patient Office Visit  Subjective   Patient ID: [name], male    DOB: [birth year]  Age: 86 y.o. MRN: [id number]  Chief Complaint  Patient presents with   Edema    Patient complains of edema in lower extremities, x5 days     HPI   Seen with bilateral foot and ankle edema which has been relatively mild for the past week.  His wife actually first noticed this.  Tonya does take amlodipine 10 mg daily which is likely contributing.  He denies any dyspnea.  No orthopnea.  No recent dietary changes.  Does not take any other medications likely to cause any significant edema.  His weight is actually down a few pounds from last visit in March.  Not aware of any recent weight gain.  He has recently seen cardiologist.  He had echocardiogram.  Mild mitral regurg.  He also has some mild aortic regurgitation.  No recent chest pains.  He has history of hyperlipidemia and also prediabetes range blood sugars.  No lipids in over a year.  Current medications include amlodipine 10 mg daily, and aspirin 81 mg daily, atorvastatin 10 mg daily, escitalopram 20 mg daily, omeprazole 40 mg daily, and valsartan 160 mg daily.  Past Medical History:  Diagnosis Date   Anxiety    BPH (benign prostatic hypertrophy)    DDD (degenerative disc disease), lumbar 2002   Depression    Difficult intubation 02/22/2019   Anterior with limited oral opening   Essential hypertension 03/10/2019   GERD (gastroesophageal reflux disease)    History of colon polyps    History of hiatal hernia 2012   Hyperlipemia    Mild   Indwelling Foley catheter present    Iron deficiency anemia    IV iron therapy    Mild hyperlipidemia 02/20/2008   Qualifier: Diagnosis of  By: Leanne Chang MD, Nixie Laube     Pre-diabetes    Renal cyst 2008   7.6 cm lower pole right renal cyst   Sleep apnea    slight, no CPAP used   Past Surgical History:  Procedure Laterality Date   COLONOSCOPY  08/2011   NASAL SEPTOPLASTY W/ TURBINOPLASTY   08/03/2012   Procedure: NASAL SEPTOPLASTY WITH TURBINATE REDUCTION;  Surgeon: Izora Gala, MD;  Location: [facility];  Service: ENT;  Laterality: Bilateral;   UPPER GASTROINTESTINAL ENDOSCOPY  08/2011   XI ROBOTIC ASSISTED SIMPLE PROSTATECTOMY N/A 02/22/2019   Procedure: XI ROBOTIC ASSISTED SIMPLE PROSTATECTOMY;  Surgeon: Alexis Frock, MD;  Location: [facility];  Service: Urology;  Laterality: N/A;    reports that he quit smoking about 54 years ago. His smoking use included cigarettes. He has a 5.00 pack-year smoking history. He has never used smokeless tobacco. He reports current alcohol use of about 1.0 standard drink of alcohol per week. He reports that he does not use drugs. family history includes Heart attack in his father; Hypertension in his brother, brother, daughter, father, and sister. Allergies  Allergen Reactions   Dexlansoprazole     Other reaction(s): Unknown    Review of Systems  Constitutional:  Negative for malaise/fatigue.  Eyes:  Negative for blurred vision.  Respiratory:  Negative for shortness of breath.   Cardiovascular:  Negative for chest pain, palpitations, orthopnea and PND.  Neurological:  Negative for dizziness, weakness and headaches.      Objective:     BP 124/66 (BP Location: Left Arm, Patient Position: Sitting, Cuff Size: Normal)   Pulse 65   Temp 97.9  F (36.6 C) (Oral)   Ht '5\' 7"'$  (1.702 m)   Wt 139 lb 12.8 oz (63.4 kg)   SpO2 98%   BMI 21.90 kg/m  BP Readings from Last 3 Encounters:  06/01/22 124/66  05/04/22 117/65  02/16/22 131/67   Wt Readings from Last 3 Encounters:  06/01/22 139 lb 12.8 oz (63.4 kg)  05/04/22 141 lb 3.2 oz (64 kg)  02/16/22 140 lb (63.5 kg)      Physical Exam Vitals reviewed.  Constitutional:      General: He is not in acute distress.    Appearance: Normal appearance. He is not toxic-appearing.  Cardiovascular:     Rate and Rhythm: Normal rate and regular rhythm.     Heart sounds: Murmur heard.     Comments: Soft  systolic ejection murmur over mitral valve increased with expiration Pulmonary:     Effort: Pulmonary effort is normal.     Breath sounds: Normal breath sounds. No wheezing or rales.  Musculoskeletal:     Comments: Only very trace edema feet and ankles bilaterally.  No leg edema.  No pitting edema.  Neurological:     Mental Status: He is alert.      No results found for any visits on 06/01/22.    The ASCVD Risk score (Arnett DK, et al., 2019) failed to calculate for the following reasons:   The 2019 ASCVD risk score is only valid for ages 91 to 27    Assessment & Plan:   #1 bilateral ankle edema which is very mild.  Probably related to amlodipine and increased vasodilatation from the heat.  Symptoms are extremely mild and recommend observation at this time.  If worsening could consider reducing his amlodipine dose since his blood pressure seems to be fairly well controlled.  Recommend frequent elevation and watch sodium intake.  #2 history of mild hyperglycemia.  No recent A1c.  Check A1c and continue low glycemic diet  #3 hyperlipidemia.  Patient on atorvastatin.  Future lab order placed for lipid and CMP and he will schedule that  #4 hypertension stable on amlodipine and valsartan.     No follow-ups on file.    Carolann Littler, MD

## 2022-06-01 NOTE — Progress Notes (Signed)
Virtual Visit via Telephone Note  I connected with [name] on 06/01/22 at  2:20 PM EDT by telephone and verified that I am speaking with the correct person using two identifiers.  Location: Patient: Home Provider: Home Office   I discussed the limitations, risks, security and privacy concerns of performing an evaluation and management service by telephone and the availability of in person appointments. I also discussed with the patient that there may be a patient responsible charge related to this service. The patient expressed understanding and agreed to proceed.   History of Present Illness: Patient is evaluated by consultation.  He is now getting [place] treatment.  He reported having a bout of severe depression started in May when he had to travel to [place] and [place].  He reported things got worse when he was in [place].  He was staying to himself.  He was sleeping too much.  He was concerned about his son who had lost his job but he is relieved that he was able to get his job and going to start Monday.  He is not sure what triggered her but he was feeling hopeless, worthless and very withdrawn.  For past 10 days he is now again doing better.  He has done so for 3 [place] treatment.  His sleep is much better.  He has not taken Ativan in the past 10 days.  He denies any suicidal thoughts.  His energy level is slowly improving.  He is tolerating Lexapro.  Denies any hallucination, paranoia or any agitation.  He has plan to finish his 36 treatment of [place].   Past Psychiatric History: Reviewed. H/O depression and anxiety most of his life.  Had a good response with TMS and Lexapro. Tried Celexa, Pristiq, Remeron, Cymbalta and Xanax but limited outcome. Klonopin and Buspar made groggy.  No h/o suicidal attempt, mania, psychosis or self abusive behavior.  Saw Dr. Cheryln Manly in the past for therapy.     Psychiatric Specialty Exam: Physical Exam  Review of Systems  Weight 139 lb (63 kg).There  is no height or weight on file to calculate BMI.  General Appearance: NA  Eye Contact:  NA  Speech:  Normal Rate  Volume:  Normal  Mood:  Anxious and Dysphoric  Affect:  NA  Thought Process:  Goal Directed  Orientation:  Full (Time, Place, and Person)  Thought Content:  Rumination  Suicidal Thoughts:  No  Homicidal Thoughts:  No  Memory:  Immediate;   Good Recent;   Good Remote;   Good  Judgement:  Intact  Insight:  Present  Psychomotor Activity:  NA  Concentration:  Concentration: Good and Attention Span: Good  Recall:  Good  Fund of Knowledge:  Good  Language:  Good  Akathisia:  No  Handed:  Right  AIMS (if indicated):     Assets:  Communication Skills Desire for Improvement Housing Social Support Transportation  ADL's:  Intact  Cognition:  WNL  Sleep:   better now      Assessment and Plan: Major depressive disorder, recurrent.  Anxiety.  Patient was depressed but now started feeling better.  He is now getting [place] treatment.  So far he has 3 treatment.  We talk about finishing the treatment of [place].  We also talk about adding a mood stabilizer in the future if [place] did not help him.  In the past we have discussed lamotrigine and now he told his daughter-in-law also talk about lamotrigine when he was in [place].  We agreed that we will monitor closely with [place] treatment and if needed we may consider adding a low-dose Lamictal if [place] did not help him.  Patient agreed with the plan.  For now we will continue Lexapro 30 mg daily.  I also recommend to call us back if is any question, concern or if he feels worsening of the symptoms.  Follow up in 2 months.  Follow Up Instructions:    I discussed the assessment and treatment plan with the patient. The patient was provided an opportunity to ask questions and all were answered. The patient agreed with the plan and demonstrated an understanding of the instructions.  Collaboration of Care: Other provider involved in patient's  care AEB notes are available in epic to review.  Patient/Guardian was advised Release of Information must be obtained prior to any record release in order to collaborate their care with an outside provider. Patient/Guardian was advised if they have not already done so to contact the registration department to sign all necessary forms in order for [name] to release information regarding their care.   Consent: Patient/Guardian gives verbal consent for treatment and assignment of benefits for services provided during this visit. Patient/Guardian expressed understanding and agreed to proceed.     The patient was advised to call back or seek an in-person evaluation if the symptoms worsen or if the condition fails to improve as anticipated.  I provided 20 minutes of non-face-to-face time during this encounter.   Kathlee Nations, MD

## 2022-06-02 ENCOUNTER — Other Ambulatory Visit (HOSPITAL_BASED_OUTPATIENT_CLINIC_OR_DEPARTMENT_OTHER): Payer: Medicare Other | Admitting: *Deleted

## 2022-06-02 DIAGNOSIS — F332 Major depressive disorder, recurrent severe without psychotic features: Secondary | ICD-10-CM | POA: Diagnosis not present

## 2022-06-02 NOTE — Progress Notes (Signed)
Patient reported to [facility] for his 4th session of Repetitive Transcranial Magnetic Stimulation treatment for severe episode of recurrent major depressive disorder, without psychotic features. Patient presented with appropriate affect, level mood and denied any suicidal or homicidal ideations. Patient denies any other current symptoms and remains optimistic with continued [place] treatment. Patient reported no change in alcohol/substance use, caffeine consumption, sleep pattern or metal implant status since previous tx. Pt sat quietly with his eyes closed during the treatment.  Power was titrated to 120% for the duration of tx and will remain there until patient gets adjusted. Patient reported no complaints or discomfort. Patient departed post-treatment with no concerns or complaints.

## 2022-06-03 ENCOUNTER — Other Ambulatory Visit (HOSPITAL_BASED_OUTPATIENT_CLINIC_OR_DEPARTMENT_OTHER): Payer: Medicare Other | Admitting: *Deleted

## 2022-06-03 ENCOUNTER — Other Ambulatory Visit (INDEPENDENT_AMBULATORY_CARE_PROVIDER_SITE_OTHER): Payer: Medicare Other

## 2022-06-03 DIAGNOSIS — I10 Essential (primary) hypertension: Secondary | ICD-10-CM

## 2022-06-03 DIAGNOSIS — R739 Hyperglycemia, unspecified: Secondary | ICD-10-CM

## 2022-06-03 DIAGNOSIS — F332 Major depressive disorder, recurrent severe without psychotic features: Secondary | ICD-10-CM | POA: Diagnosis not present

## 2022-06-03 DIAGNOSIS — E785 Hyperlipidemia, unspecified: Secondary | ICD-10-CM

## 2022-06-03 LAB — COMPREHENSIVE METABOLIC PANEL WITH GFR
ALT: 21 U/L (ref 0–53)
AST: 16 U/L (ref 0–37)
Albumin: 4.1 g/dL (ref 3.5–5.2)
Alkaline Phosphatase: 52 U/L (ref 39–117)
BUN: 18 mg/dL (ref 6–23)
CO2: 28 meq/L (ref 19–32)
Calcium: 8.9 mg/dL (ref 8.4–10.5)
Chloride: 100 meq/L (ref 96–112)
Creatinine, Ser: 0.87 mg/dL (ref 0.40–1.50)
GFR: 79.61 mL/min
Glucose, Bld: 103 mg/dL — ABNORMAL HIGH (ref 70–99)
Potassium: 4.2 meq/L (ref 3.5–5.1)
Sodium: 133 meq/L — ABNORMAL LOW (ref 135–145)
Total Bilirubin: 0.5 mg/dL (ref 0.2–1.2)
Total Protein: 6.8 g/dL (ref 6.0–8.3)

## 2022-06-03 LAB — LIPID PANEL
Cholesterol: 130 mg/dL (ref 0–200)
HDL: 57.6 mg/dL (ref >39.00)
LDL Cholesterol: 63 mg/dL (ref 0–99)
NonHDL: 72.46
Total CHOL/HDL Ratio: 2
Triglycerides: 48 mg/dL (ref 0.0–149.0)
VLDL: 9.6 mg/dL (ref 0.0–40.0)

## 2022-06-03 LAB — HEMOGLOBIN A1C: Hgb A1c MFr Bld: 6.1 % (ref 4.6–6.5)

## 2022-06-03 NOTE — Progress Notes (Signed)
Patient reported to [facility] for his 5th session of Repetitive Transcranial Magnetic Stimulation treatment for severe episode of recurrent major depressive disorder, without psychotic features. Patient presented with appropriate affect, level mood and denied any suicidal or homicidal ideations. Patient denies any other current symptoms and remains optimistic with continued [place] treatment. Patient reported no change in alcohol/substance use, caffeine consumption, sleep pattern or metal implant status since previous tx. Pt sat quietly with his eyes closed during the treatment.  Power was titrated to 120% for the duration of tx and will remain there until patient gets adjusted. Patient reported no complaints or discomfort. Patient departed post-treatment with no concerns or complaints.

## 2022-06-06 ENCOUNTER — Other Ambulatory Visit (HOSPITAL_BASED_OUTPATIENT_CLINIC_OR_DEPARTMENT_OTHER): Payer: Medicare Other | Admitting: *Deleted

## 2022-06-06 DIAGNOSIS — F332 Major depressive disorder, recurrent severe without psychotic features: Secondary | ICD-10-CM

## 2022-06-06 NOTE — Progress Notes (Signed)
Patient reported to [facility] for his 6th session of Repetitive Transcranial Magnetic Stimulation treatment for severe episode of recurrent major depressive disorder, without psychotic features. Patient presented with appropriate affect, level mood and denied any suicidal or homicidal ideations. Patient denies any other current symptoms and remains optimistic with continued [place] treatment. Patient reported no change in alcohol/substance use, caffeine consumption, sleep pattern or metal implant status since previous tx. Pt sat quietly with his eyes closed during the treatment.  Power was titrated to 120% for the duration of tx and will remain there until patient gets adjusted. Patient reported no complaints or discomfort. Patient departed post-treatment with no concerns or complaints.

## 2022-06-07 ENCOUNTER — Other Ambulatory Visit (HOSPITAL_BASED_OUTPATIENT_CLINIC_OR_DEPARTMENT_OTHER): Payer: Medicare Other | Admitting: *Deleted

## 2022-06-07 DIAGNOSIS — F332 Major depressive disorder, recurrent severe without psychotic features: Secondary | ICD-10-CM

## 2022-06-07 NOTE — Progress Notes (Signed)
Patient reported to [facility] for his 7th session of Repetitive Transcranial Magnetic Stimulation treatment for severe episode of recurrent major depressive disorder, without psychotic features. Patient presented with appropriate affect, level mood and denied any suicidal or homicidal ideations. Patient denies any other current symptoms and remains optimistic with continued [place] treatment. Patient reported no change in alcohol/substance use, caffeine consumption, sleep pattern or metal implant status since previous tx. Pt sat quietly with his eyes closed during the treatment.  Power was titrated to 120% for the duration of tx and will remain there until patient gets adjusted. Patient reported no complaints or discomfort. Patient departed post-treatment with no concerns or complaints.

## 2022-06-08 ENCOUNTER — Other Ambulatory Visit (HOSPITAL_BASED_OUTPATIENT_CLINIC_OR_DEPARTMENT_OTHER): Payer: Medicare Other | Admitting: *Deleted

## 2022-06-08 DIAGNOSIS — F332 Major depressive disorder, recurrent severe without psychotic features: Secondary | ICD-10-CM | POA: Diagnosis not present

## 2022-06-08 NOTE — Progress Notes (Signed)
Patient reported to [facility] for his 8th session of Repetitive Transcranial Magnetic Stimulation treatment for severe episode of recurrent major depressive disorder, without psychotic features. Patient presented with appropriate affect, level mood and denied any suicidal or homicidal ideations. Patient denies any other current symptoms and remains optimistic with continued [place] treatment. Patient reported no change in alcohol/substance use, caffeine consumption, sleep pattern or metal implant status since previous tx. Pt talkative at first; conversing about upcoming trip to [place] in October. Pt sat quietly with his eyes closed during the treatment.  Power was titrated to 120% for the duration of tx and will remain there until patient gets adjusted. Patient reported no complaints or discomfort. Patient departed post-treatment with no concerns or complaints

## 2022-06-09 ENCOUNTER — Other Ambulatory Visit (HOSPITAL_BASED_OUTPATIENT_CLINIC_OR_DEPARTMENT_OTHER): Payer: Medicare Other | Admitting: *Deleted

## 2022-06-09 DIAGNOSIS — F332 Major depressive disorder, recurrent severe without psychotic features: Secondary | ICD-10-CM | POA: Diagnosis not present

## 2022-06-09 NOTE — Progress Notes (Signed)
Patient reported to [facility] for his 9th session of Repetitive Transcranial Magnetic Stimulation treatment for severe episode of recurrent major depressive disorder, without psychotic features. Patient presented with appropriate affect, level mood and denied any suicidal or homicidal ideations. Patient denies any other current symptoms and remains optimistic with continued [place] treatment. Patient reported no change in alcohol/substance use, caffeine consumption, sleep pattern or metal implant status since previous tx. Pt sat quietly with his eyes closed during the treatment.  Power was titrated to 120% for the duration of tx and will remain there until patient gets adjusted. Patient reported no complaints or discomfort. Patient departed post-treatment with no concerns or complaints

## 2022-06-10 ENCOUNTER — Encounter (HOSPITAL_COMMUNITY): Payer: Medicare Other

## 2022-06-13 ENCOUNTER — Other Ambulatory Visit (HOSPITAL_COMMUNITY): Payer: Medicare Other | Attending: Psychiatry | Admitting: *Deleted

## 2022-06-13 ENCOUNTER — Telehealth (HOSPITAL_COMMUNITY): Payer: Self-pay | Admitting: Psychiatry

## 2022-06-13 DIAGNOSIS — F332 Major depressive disorder, recurrent severe without psychotic features: Secondary | ICD-10-CM | POA: Diagnosis not present

## 2022-06-13 NOTE — Telephone Encounter (Signed)
D:  Pt has been receiving T[place] since 05-30-22.  A:  Called to check on patient.  Pt states he is doing "well."  Denies any issues.  Inform [place] team.  R:  Pt receptive.

## 2022-06-13 NOTE — Progress Notes (Signed)
Patient reported to [facility] for his 10th session of Repetitive Transcranial Magnetic Stimulation treatment for severe episode of recurrent major depressive disorder, without psychotic features. Patient presented with appropriate affect, level mood and denied any suicidal or homicidal ideations. Patient denies any other current symptoms and remains optimistic with continued [place] treatment. Patient reported no change in alcohol/substance use, caffeine consumption, sleep pattern or metal implant status since previous tx. Pt went golfing on Friday and reported having fun playing with old friends.Pt sat quietly with his eyes closed during the treatment.  Power was titrated to 120% for the duration of tx and will remain there until patient gets adjusted. Patient reported no complaints or discomfort. Patient departed post-treatment with no concerns or complaints

## 2022-06-14 ENCOUNTER — Other Ambulatory Visit (HOSPITAL_COMMUNITY): Payer: Medicare Other | Attending: Psychiatry | Admitting: *Deleted

## 2022-06-14 DIAGNOSIS — F332 Major depressive disorder, recurrent severe without psychotic features: Secondary | ICD-10-CM | POA: Diagnosis not present

## 2022-06-14 NOTE — Progress Notes (Signed)
Patient reported to [facility] for his 11th session of Repetitive Transcranial Magnetic Stimulation treatment for severe episode of recurrent major depressive disorder, without psychotic features. Patient presented with appropriate affect, level mood and denied any suicidal or homicidal ideations. Patient denies any other current symptoms and remains optimistic with continued [place] treatment. Patient reported no change in alcohol/substance use, caffeine consumption, sleep pattern or metal implant status since previous tx. Pt sat quietly with his eyes closed or playing a game on his phone during the treatment.  Power was titrated to 120% for the duration of tx and will remain there until patient gets adjusted. Patient reported no complaints or discomfort. Patient departed post-treatment with no concerns or complaints

## 2022-06-15 ENCOUNTER — Other Ambulatory Visit (HOSPITAL_COMMUNITY): Payer: Medicare Other | Attending: Psychiatry | Admitting: *Deleted

## 2022-06-15 ENCOUNTER — Telehealth: Payer: Self-pay

## 2022-06-15 DIAGNOSIS — F332 Major depressive disorder, recurrent severe without psychotic features: Secondary | ICD-10-CM

## 2022-06-15 NOTE — Telephone Encounter (Signed)
VM 1110 : Patient called and left a message on VM.  Message is as follows:   Hi this is [name] and date of birth is 1935/11/20 I'm a patient of Dr Einar Gip put me on Lipitor about 6 weeks ago and I've been having a lot of pain in my legs which could be because of that so could you call me back please on [phone] thank you."   I called patient back, and he stated that he has skipped his last dose and will be skipping tonight also.   Please advise.

## 2022-06-15 NOTE — Progress Notes (Signed)
Patient reported to [facility] for his 12th session of Repetitive Transcranial Magnetic Stimulation treatment for severe episode of recurrent major depressive disorder, without psychotic features. Patient presented with appropriate affect, level mood and denied any suicidal or homicidal ideations. Patient denies any other current symptoms and remains optimistic with continued [place] treatment. Patient reported no change in alcohol/substance use, caffeine consumption, sleep pattern or metal implant status since previous tx. Pt sat quietly with his eyes closed or playing a game on his phone during the treatment.  Power was titrated to 120% for the duration of tx and will remain there until patient gets adjusted. Patient reported no complaints or discomfort. Patient departed post-treatment with no concerns or complaints

## 2022-06-15 NOTE — Telephone Encounter (Signed)
Ask him to stop the medication for 2 weeks and start 1/2 tablet only or he takes it every other day and let me know how he feels

## 2022-06-16 ENCOUNTER — Other Ambulatory Visit (HOSPITAL_COMMUNITY): Payer: Medicare Other | Attending: Psychiatry | Admitting: *Deleted

## 2022-06-16 DIAGNOSIS — F332 Major depressive disorder, recurrent severe without psychotic features: Secondary | ICD-10-CM

## 2022-06-16 NOTE — Progress Notes (Signed)
Patient reported to [facility] for his 13th session of Repetitive Transcranial Magnetic Stimulation treatment for severe episode of recurrent major depressive disorder, without psychotic features. Patient presented with appropriate affect, level mood and denied any suicidal or homicidal ideations. Patient denies any other current symptoms and remains optimistic with continued [place] treatment. Patient reported no change in alcohol/substance use, caffeine consumption, sleep pattern or metal implant status since previous tx. Pt was talkative during the treatment.  Power was titrated to 120% for the duration of tx and will remain there until patient gets adjusted. Patient reported no complaints or discomfort. Patient departed post-treatment with no concerns or complaints

## 2022-06-17 ENCOUNTER — Other Ambulatory Visit (HOSPITAL_COMMUNITY): Payer: Medicare Other | Attending: Psychiatry | Admitting: *Deleted

## 2022-06-17 DIAGNOSIS — F332 Major depressive disorder, recurrent severe without psychotic features: Secondary | ICD-10-CM | POA: Diagnosis not present

## 2022-06-17 NOTE — Progress Notes (Signed)
Patient reported to [facility] for his 14th session of Repetitive Transcranial Magnetic Stimulation treatment for severe episode of recurrent major depressive disorder, without psychotic features. Patient presented with appropriate affect, level mood and denied any suicidal or homicidal ideations. Patient denies any other current symptoms and remains optimistic with continued [place] treatment. Patient reported no change in alcohol/substance use, caffeine consumption, sleep pattern or metal implant status since previous tx. Pt less talkative today; played a game on his phone. PHQ-9 score a 10 today.  Power was titrated to 120% for the duration of tx and will remain there until patient gets adjusted. Patient reported no complaints or discomfort. Patient departed post-treatment with no concerns or complaints

## 2022-06-20 ENCOUNTER — Telehealth (HOSPITAL_COMMUNITY): Payer: Self-pay | Admitting: *Deleted

## 2022-06-20 ENCOUNTER — Other Ambulatory Visit (HOSPITAL_COMMUNITY): Payer: Medicare Other | Attending: Psychiatry

## 2022-06-20 DIAGNOSIS — F332 Major depressive disorder, recurrent severe without psychotic features: Secondary | ICD-10-CM

## 2022-06-20 NOTE — Telephone Encounter (Signed)
Pt called back, made aware of [place] instructions//ah

## 2022-06-20 NOTE — Telephone Encounter (Signed)
His prescription was sent on August 2 with 2 additional refills.  He need to call his pharmacy to check the refills.

## 2022-06-20 NOTE — Telephone Encounter (Signed)
Patient called Rx stated there is NO refills on  escitalopram (LEXAPRO) 20 MG tablet

## 2022-06-20 NOTE — Progress Notes (Unsigned)
Patient reported to [facility] for his 15th session of Repetitive Transcranial Magnetic Stimulation treatment for severe episode of recurrent major depressive disorder, without psychotic features. Patient presented with appropriate affect, level mood and denied any suicidal or homicidal ideations. Patient denies any other current symptoms and remains optimistic with continued [place] treatment. Patient reported no change in alcohol/substance use, caffeine consumption, sleep pattern or metal implant status since previous tx. Pt less talkative today. Power was titrated to 120% for the duration of tx and will remain there until patient gets adjusted. Patient reported no complaints or discomfort. Patient departed post-treatment with no concerns or complaints

## 2022-06-21 ENCOUNTER — Other Ambulatory Visit (HOSPITAL_COMMUNITY): Payer: Medicare Other | Attending: Psychiatry | Admitting: *Deleted

## 2022-06-21 DIAGNOSIS — F332 Major depressive disorder, recurrent severe without psychotic features: Secondary | ICD-10-CM

## 2022-06-21 NOTE — Progress Notes (Signed)
Patient reported to [facility] for his 16th session of Repetitive Transcranial Magnetic Stimulation treatment for severe episode of recurrent major depressive disorder, without psychotic features. Patient presented with appropriate affect, level mood and denied any suicidal or homicidal ideations. Patient denies any other current symptoms and remains optimistic with continued [place] treatment. Patient reported no change in alcohol/substance use, caffeine consumption, sleep pattern or metal implant status since previous tx. Pt less talkative today. Power was titrated to 120% for the duration of tx and will remain there until patient gets adjusted. Patient reported no complaints or discomfort. Patient departed post-treatment with no concerns or complaints

## 2022-06-22 ENCOUNTER — Other Ambulatory Visit (HOSPITAL_COMMUNITY): Payer: Medicare Other | Attending: Psychiatry | Admitting: *Deleted

## 2022-06-22 DIAGNOSIS — F332 Major depressive disorder, recurrent severe without psychotic features: Secondary | ICD-10-CM | POA: Diagnosis not present

## 2022-06-22 NOTE — Progress Notes (Signed)
Patient reported to [facility] for his 17th session of Repetitive Transcranial Magnetic Stimulation treatment for severe episode of recurrent major depressive disorder, without psychotic features. Patient presented with appropriate affect, level mood and denied any suicidal or homicidal ideations. Patient denies any other current symptoms and remains optimistic with continued [place] treatment. Patient reported no change in alcohol/substance use, caffeine consumption, sleep pattern or metal implant status since previous tx. Pt less talkative today. Power was titrated to 120% for the duration of tx and will remain there until patient gets adjusted. Patient reported no complaints or discomfort. Patient departed post-treatment with no concerns or complaints

## 2022-06-23 ENCOUNTER — Other Ambulatory Visit (HOSPITAL_COMMUNITY): Payer: Medicare Other | Attending: Psychiatry | Admitting: *Deleted

## 2022-06-23 DIAGNOSIS — F332 Major depressive disorder, recurrent severe without psychotic features: Secondary | ICD-10-CM | POA: Diagnosis not present

## 2022-06-23 NOTE — Progress Notes (Signed)
Patient reported to [facility] for his 18th session of Repetitive Transcranial Magnetic Stimulation treatment for severe episode of recurrent major depressive disorder, without psychotic features. Patient presented with appropriate affect, level mood and denied any suicidal or homicidal ideations. Patient denies any other current symptoms and remains optimistic with continued [place] treatment. Patient reported no change in alcohol/substance use, caffeine consumption, sleep pattern or metal implant status since previous tx. Pt talkative today. Power was titrated to 120% for the duration of tx and will remain there until patient gets adjusted. Patient reported no complaints or discomfort. Patient departed post-treatment with no concerns or complaints.

## 2022-06-24 ENCOUNTER — Other Ambulatory Visit (HOSPITAL_COMMUNITY): Payer: Medicare Other | Attending: Psychiatry | Admitting: *Deleted

## 2022-06-24 DIAGNOSIS — F332 Major depressive disorder, recurrent severe without psychotic features: Secondary | ICD-10-CM

## 2022-06-24 NOTE — Progress Notes (Addendum)
Patient reported to [facility] for his 18th session of Repetitive Transcranial Magnetic Stimulation treatment for severe episode of recurrent major depressive disorder, without psychotic features. Patient presented with appropriate affect, level mood and denied any suicidal or homicidal ideations. Patient denies any other current symptoms and remains optimistic with continued [place] treatment. Patient reported no change in alcohol/substance use, caffeine consumption, sleep pattern or metal implant status since previous tx. Pt talkative today. Power was titrated to 120% for the duration of tx and will remain there until patient gets adjusted. Patient reported no complaints or discomfort. Patient departed post-treatment with no concerns or complaints.Pt's PHQ-9 score a 2 today

## 2022-06-27 ENCOUNTER — Other Ambulatory Visit (HOSPITAL_COMMUNITY): Payer: Medicare Other | Attending: Psychiatry | Admitting: *Deleted

## 2022-06-27 DIAGNOSIS — F332 Major depressive disorder, recurrent severe without psychotic features: Secondary | ICD-10-CM | POA: Diagnosis not present

## 2022-06-27 NOTE — Progress Notes (Signed)
Patient reported to [facility] for his 20th session of Repetitive Transcranial Magnetic Stimulation treatment for severe episode of recurrent major depressive disorder, without psychotic features. Patient presented with appropriate affect, level mood and denied any suicidal or homicidal ideations. Patient denies any other current symptoms and remains optimistic with continued [place] treatment. Patient reported no change in alcohol/substance use, caffeine consumption, sleep pattern or metal implant status since previous tx. Pt talkative today. Power was titrated to 120% for the duration of tx and will remain there until patient gets adjusted. Patient reported no complaints or discomfort. Patient departed post-treatment with no concerns or complaints.

## 2022-06-28 ENCOUNTER — Other Ambulatory Visit (HOSPITAL_COMMUNITY): Payer: Medicare Other | Attending: Psychiatry | Admitting: *Deleted

## 2022-06-28 DIAGNOSIS — F332 Major depressive disorder, recurrent severe without psychotic features: Secondary | ICD-10-CM | POA: Diagnosis not present

## 2022-06-28 NOTE — Progress Notes (Signed)
Patient reported to [facility] for his 21ST session of Repetitive Transcranial Magnetic Stimulation treatment for severe episode of recurrent major depressive disorder, without psychotic features. Patient presented with appropriate affect, level mood and denied any suicidal or homicidal ideations. Patient denies any other current symptoms and remains optimistic with continued [place] treatment. Patient reported no change in alcohol/substance use, caffeine consumption, sleep pattern or metal implant status since previous tx. Pt less talkative today. Power was titrated to 120% for the duration of tx and will remain there until patient gets adjusted. Patient reported no complaints or discomfort. Patient departed post-treatment with no concerns or complaints.

## 2022-06-29 ENCOUNTER — Other Ambulatory Visit (HOSPITAL_COMMUNITY): Payer: Medicare Other | Attending: Psychiatry | Admitting: *Deleted

## 2022-06-29 DIAGNOSIS — F332 Major depressive disorder, recurrent severe without psychotic features: Secondary | ICD-10-CM | POA: Diagnosis not present

## 2022-06-29 NOTE — Progress Notes (Signed)
Patient reported to [facility] for his 22nd session of Repetitive Transcranial Magnetic Stimulation treatment for severe episode of recurrent major depressive disorder, without psychotic features. Patient presented with appropriate affect, level mood and denied any suicidal or homicidal ideations. Patient denies any other current symptoms and remains optimistic with continued [place] treatment. Patient reported no change in alcohol/substance use, caffeine consumption, sleep pattern or metal implant status since previous tx. Pt less talkative today. Power was titrated to 120% for the duration of tx and will remain there until patient gets adjusted. Patient reported no complaints or discomfort. Patient departed post-treatment with no concerns or complaints.

## 2022-06-30 ENCOUNTER — Other Ambulatory Visit (HOSPITAL_COMMUNITY): Payer: Medicare Other | Attending: Psychiatry | Admitting: *Deleted

## 2022-06-30 DIAGNOSIS — F332 Major depressive disorder, recurrent severe without psychotic features: Secondary | ICD-10-CM

## 2022-06-30 NOTE — Progress Notes (Signed)
Patient reported to [name] for his 23rd session of Repetitive Transcranial Magnetic Stimulation treatment for severe episode of recurrent major depressive disorder, without psychotic features. Patient presented with appropriate affect, level mood and denied any suicidal or homicidal ideations. Patient denies any other current symptoms and remains optimistic with continued [name] treatment. Patient reported no change in alcohol/substance use, caffeine consumption, sleep pattern or metal implant status since previous tx. Pt brightens on approach. Power was titrated to 120% for the duration of tx and will remain there until patient gets adjusted. Patient reported no complaints or discomfort. Patient departed post-treatment with no concerns or complaints

## 2022-07-01 ENCOUNTER — Other Ambulatory Visit (HOSPITAL_COMMUNITY): Payer: Medicare Other | Attending: Psychiatry | Admitting: *Deleted

## 2022-07-01 DIAGNOSIS — F332 Major depressive disorder, recurrent severe without psychotic features: Secondary | ICD-10-CM

## 2022-07-01 NOTE — Progress Notes (Signed)
Patient reported to [facility] for his 24th session of Repetitive Transcranial Magnetic Stimulation treatment for severe episode of recurrent major depressive disorder, without psychotic features. Patient presented with appropriate affect, level mood and denied any suicidal or homicidal ideations. Patient denies any other current symptoms and remains optimistic with continued [place] treatment. Patient reported no change in alcohol/substance use, caffeine consumption, sleep pattern or metal implant status since previous tx. Pt brightens on approach. Power was titrated to 120% for the duration of tx and will remain there until patient gets adjusted. Patient reported no complaints or discomfort. Patient departed post-treatment with no concerns or complaints.PHQ-9 today a 2.

## 2022-07-05 ENCOUNTER — Other Ambulatory Visit (HOSPITAL_COMMUNITY): Payer: Medicare Other | Attending: Psychiatry | Admitting: *Deleted

## 2022-07-05 DIAGNOSIS — F332 Major depressive disorder, recurrent severe without psychotic features: Secondary | ICD-10-CM

## 2022-07-05 NOTE — Progress Notes (Signed)
Patient reported to [facility] for his 25th session of Repetitive Transcranial Magnetic Stimulation treatment for severe episode of recurrent major depressive disorder, without psychotic features. Patient presented with appropriate affect, level mood and denied any suicidal or homicidal ideations. Patient denies any other current symptoms and remains optimistic with continued [place] treatment. Patient reported no change in alcohol/substance use, caffeine consumption, sleep pattern or metal implant status since previous tx. Pt brightens on approach. Pt reports having a very quiet long weekend. Power was titrated to 120% for the duration of tx and will remain there until patient gets adjusted. Patient reported no complaints or discomfort. Patient departed post-treatment with no concerns or complaints.PHQ-9 today a 2.

## 2022-07-06 ENCOUNTER — Other Ambulatory Visit (HOSPITAL_COMMUNITY): Payer: Medicare Other | Attending: Psychiatry | Admitting: *Deleted

## 2022-07-06 ENCOUNTER — Telehealth (HOSPITAL_COMMUNITY): Payer: Self-pay | Admitting: Psychiatry

## 2022-07-06 DIAGNOSIS — F332 Major depressive disorder, recurrent severe without psychotic features: Secondary | ICD-10-CM | POA: Diagnosis not present

## 2022-07-06 NOTE — Progress Notes (Signed)
Patient reported to [facility] for his 26th session of Repetitive Transcranial Magnetic Stimulation treatment for severe episode of recurrent major depressive disorder, without psychotic features. Patient presented with appropriate affect, level mood and denied any suicidal or homicidal ideations. Patient denies any other current symptoms and remains optimistic with continued [place] treatment. Patient reported no change in alcohol/substance use, caffeine consumption, sleep pattern or metal implant status since previous tx. Pt brightens on approach. Pt reports having a good day yesterday. Power was titrated to 120% for the duration of tx and will remain there until patient gets adjusted. Patient reported no complaints or discomfort. Patient departed post-treatment with no concerns or complaints.

## 2022-07-07 ENCOUNTER — Other Ambulatory Visit (HOSPITAL_COMMUNITY): Payer: Medicare Other | Attending: Psychiatry | Admitting: *Deleted

## 2022-07-07 DIAGNOSIS — F332 Major depressive disorder, recurrent severe without psychotic features: Secondary | ICD-10-CM | POA: Diagnosis not present

## 2022-07-07 NOTE — Progress Notes (Signed)
Patient reported to [facility] for his 27th session of Repetitive Transcranial Magnetic Stimulation treatment for severe episode of recurrent major depressive disorder, without psychotic features. Patient presented with appropriate affect, level mood and denied any suicidal or homicidal ideations. Patient denies any other current symptoms and remains optimistic with continued [place] treatment. Patient reported no change in alcohol/substance use, caffeine consumption. Power was titrated to 120% for the duration of tx. Patient reported no complaints or discomfort. Patient departed post-treatment with no concerns or complaints.

## 2022-07-08 ENCOUNTER — Other Ambulatory Visit (HOSPITAL_COMMUNITY): Payer: Medicare Other | Attending: Psychiatry | Admitting: *Deleted

## 2022-07-08 DIAGNOSIS — F332 Major depressive disorder, recurrent severe without psychotic features: Secondary | ICD-10-CM

## 2022-07-08 NOTE — Progress Notes (Signed)
Patient reported to [facility] for his 28th session of Repetitive Transcranial Magnetic Stimulation treatment for severe episode of recurrent major depressive disorder, without psychotic features. Patient presented with appropriate affect, level mood and denied any suicidal or homicidal ideations. Patient denies any other current symptoms and remains optimistic with continued [place] treatment. Patient reported no change in alcohol/substance use, caffeine consumption. Power was titrated to 120% for the duration of tx. Patient reported no complaints or discomfort. Patient departed post-treatment with no concerns or complaints. PHQ-9 today is a 1.

## 2022-07-11 ENCOUNTER — Other Ambulatory Visit: Payer: Self-pay | Admitting: Family Medicine

## 2022-07-11 ENCOUNTER — Other Ambulatory Visit (HOSPITAL_COMMUNITY): Payer: Medicare Other | Attending: Psychiatry | Admitting: *Deleted

## 2022-07-11 DIAGNOSIS — F332 Major depressive disorder, recurrent severe without psychotic features: Secondary | ICD-10-CM | POA: Diagnosis not present

## 2022-07-11 NOTE — Progress Notes (Signed)
Patient reported to [facility] for his 29th session of Repetitive Transcranial Magnetic Stimulation treatment for severe episode of recurrent major depressive disorder, without psychotic features. Patient presented with appropriate affect, level mood and denied any suicidal or homicidal ideations. Patient denies any other current symptoms and remains optimistic with continued [place] treatment. Patient reported no change in alcohol/substance use, caffeine consumption. Power was titrated to 120% for the duration of tx. Patient reported no complaints or discomfort. Pt reported having a good weekend, and plans to golf a few times this week. Patient departed post-treatment with no concerns or complaints.

## 2022-07-12 ENCOUNTER — Other Ambulatory Visit (HOSPITAL_COMMUNITY): Payer: Medicare Other | Attending: Psychiatry | Admitting: *Deleted

## 2022-07-12 DIAGNOSIS — F332 Major depressive disorder, recurrent severe without psychotic features: Secondary | ICD-10-CM | POA: Diagnosis not present

## 2022-07-12 NOTE — Progress Notes (Signed)
Patient reported to [facility] for his 30th session of Repetitive Transcranial Magnetic Stimulation treatment for severe episode of recurrent major depressive disorder, without psychotic features. Patient presented with appropriate affect, level mood and denied any suicidal or homicidal ideations. Patient denies any other current symptoms. Patient reported no change in alcohol/substance use, caffeine consumption. Power was titrated to 120% for the duration of tx. Patient reported no complaints or discomfort. Pt reported he will begin his taper on Thursday. Patient departed post-treatment with no concerns or complaints.

## 2022-07-13 ENCOUNTER — Encounter (HOSPITAL_COMMUNITY): Payer: Medicare Other

## 2022-07-14 ENCOUNTER — Other Ambulatory Visit (HOSPITAL_COMMUNITY): Payer: Medicare Other | Attending: Psychiatry | Admitting: *Deleted

## 2022-07-14 DIAGNOSIS — F332 Major depressive disorder, recurrent severe without psychotic features: Secondary | ICD-10-CM

## 2022-07-14 NOTE — Progress Notes (Addendum)
Patient reported to [facility] for his 1st taper session of Repetitive Transcranial Magnetic Stimulation treatment for severe episode of recurrent major depressive disorder, without psychotic features. Patient presented with appropriate affect, level mood and denied any suicidal or homicidal ideations. Patient denies any other current symptoms. Patient reported no change in alcohol/substance use, caffeine consumption. Power was titrated to 120% for the duration of tx. Patient reported no complaints or discomfort. Pt reported he that he went golfing yesterday and had a good time. Patient departed post-treatment with no concerns or complaints. PHQ-9 score a 0 this morning.

## 2022-07-15 ENCOUNTER — Telehealth (HOSPITAL_COMMUNITY): Payer: Self-pay | Admitting: Psychiatry

## 2022-07-15 ENCOUNTER — Encounter (HOSPITAL_COMMUNITY): Payer: Medicare Other

## 2022-07-18 ENCOUNTER — Other Ambulatory Visit (HOSPITAL_COMMUNITY): Payer: Medicare Other | Attending: Psychiatry | Admitting: *Deleted

## 2022-07-18 DIAGNOSIS — F332 Major depressive disorder, recurrent severe without psychotic features: Secondary | ICD-10-CM

## 2022-07-18 NOTE — Patient Instructions (Signed)
Patient reported to [facility] for his 2nd taper session of Repetitive Transcranial Magnetic Stimulation treatment for severe episode of recurrent major depressive disorder, without psychotic features. Patient presented with appropriate affect, level mood and denied any suicidal or homicidal ideations. Patient denies any other current symptoms. Patient reported no change in alcohol/substance use, caffeine consumption. Power was titrated to 120% for the duration of tx. Patient reported no complaints or discomfort. Pt reported he that he went golfing again on Friday and that his brother in-law is visiting from [place]. Patient departed post-treatment with no concerns or complaints.

## 2022-07-21 ENCOUNTER — Other Ambulatory Visit (HOSPITAL_COMMUNITY): Payer: Medicare Other | Attending: Psychiatry | Admitting: *Deleted

## 2022-07-21 DIAGNOSIS — F332 Major depressive disorder, recurrent severe without psychotic features: Secondary | ICD-10-CM | POA: Diagnosis not present

## 2022-07-21 NOTE — Progress Notes (Signed)
Patient reported to [facility] for his 3rd taper session of Repetitive Transcranial Magnetic Stimulation treatment for severe episode of recurrent major depressive disorder, without psychotic features. Patient presented with appropriate affect, level mood and denied any suicidal or homicidal ideations. Patient denies any other current symptoms. Patient reported no change in alcohol/substance use, caffeine consumption. Power was titrated to 120% for the duration of tx. Patient reported no complaints or discomfort. Patient departed post-treatment with no concerns or complaints. PHQ-9 today is a 0.

## 2022-07-25 ENCOUNTER — Other Ambulatory Visit (HOSPITAL_COMMUNITY): Payer: Medicare Other | Attending: Psychiatry | Admitting: *Deleted

## 2022-07-25 DIAGNOSIS — F332 Major depressive disorder, recurrent severe without psychotic features: Secondary | ICD-10-CM | POA: Diagnosis not present

## 2022-07-25 NOTE — Progress Notes (Signed)
Patient reported to [facility] for his 4th taper session of Repetitive Transcranial Magnetic Stimulation treatment for severe episode of recurrent major depressive disorder, without psychotic features. Patient presented with appropriate affect, level mood and denied any suicidal or homicidal ideations. Patient denies any other current symptoms. Patient reported no change in alcohol/substance use, caffeine consumption. Power was titrated to 120% for the duration of tx. Patient reported no complaints or discomfort. Patient departed post-treatment with no concerns or complaints.

## 2022-07-28 ENCOUNTER — Other Ambulatory Visit (HOSPITAL_COMMUNITY): Payer: Medicare Other | Attending: Psychiatry | Admitting: *Deleted

## 2022-07-28 DIAGNOSIS — F332 Major depressive disorder, recurrent severe without psychotic features: Secondary | ICD-10-CM | POA: Diagnosis not present

## 2022-07-28 NOTE — Progress Notes (Signed)
Patient reported to [facility] for his 5th taper session of Repetitive Transcranial Magnetic Stimulation treatment for severe episode of recurrent major depressive disorder, without psychotic features. Patient presented with appropriate affect, level mood and denied any suicidal or homicidal ideations. Patient denies any other current symptoms. Patient reported no change in alcohol/substance use, caffeine consumption. Power was titrated to 120% for the duration of tx. Patient reported no complaints or discomfort. Patient departed post-treatment with no concerns or complaints.

## 2022-08-01 ENCOUNTER — Other Ambulatory Visit (HOSPITAL_COMMUNITY): Payer: Medicare Other | Attending: Psychiatry | Admitting: *Deleted

## 2022-08-01 ENCOUNTER — Encounter (HOSPITAL_COMMUNITY): Payer: Self-pay | Admitting: Psychiatry

## 2022-08-01 ENCOUNTER — Telehealth (HOSPITAL_BASED_OUTPATIENT_CLINIC_OR_DEPARTMENT_OTHER): Payer: Medicare Other | Admitting: Psychiatry

## 2022-08-01 DIAGNOSIS — F332 Major depressive disorder, recurrent severe without psychotic features: Secondary | ICD-10-CM

## 2022-08-01 DIAGNOSIS — F419 Anxiety disorder, unspecified: Secondary | ICD-10-CM

## 2022-08-01 DIAGNOSIS — F418 Other specified anxiety disorders: Secondary | ICD-10-CM

## 2022-08-01 MED ORDER — ESCITALOPRAM OXALATE 20 MG PO TABS: ORAL_TABLET | ORAL | 2 refills | Status: DC

## 2022-08-01 NOTE — Progress Notes (Signed)
Virtual Visit via Telephone Note  I connected with [name] on 08/01/22 at  3:20 PM EDT by telephone and verified that I am speaking with the correct person using two identifiers.  Location: Patient: Home Provider: Home Office   I discussed the limitations, risks, security and privacy concerns of performing an evaluation and management service by telephone and the availability of in person appointments. I also discussed with the patient that there may be a patient responsible charge related to this service. The patient expressed understanding and agreed to proceed.   History of Present Illness: Patient is evaluated by phone session.  He recently finished [place] treatment and doing well.  Excited about upcoming trip to [place] with another couple.  He is sleeping good.  He denies any crying spells or any feeling of hopelessness or worthlessness.  He denies any suicidal thoughts.  His appetite is okay.  He has no tremor or shakes or any EPS.  He has not taken Ativan since he started the [place] treatment.  He feels good about himself.  He wants to keep the Lexapro 30 mg and reported no tremors, shakes or any EPS.  He does not need a new prescription of Ativan.    Past Psychiatric History: Reviewed. H/O depression and anxiety most of his life.  Had a good response with TMS and Lexapro. Tried Celexa, Pristiq, Remeron, Cymbalta and Xanax but limited outcome. Klonopin and Buspar made groggy.  No h/o suicidal attempt, mania, psychosis or self abusive behavior.  Saw Dr. Cheryln Manly in the past for therapy.      Psychiatric Specialty Exam: Physical Exam  Review of Systems  Weight 139 lb (63 kg).Body mass index is 21.77 kg/m.  General Appearance: NA  Eye Contact:  NA  Speech:  Clear and Coherent and Normal Rate  Volume:  Normal  Mood:  Euthymic  Affect:  NA  Thought Process:  Goal Directed  Orientation:  Full (Time, Place, and Person)  Thought Content:  Logical  Suicidal Thoughts:  No  Homicidal  Thoughts:  No  Memory:  Immediate;   Good Recent;   Good Remote;   Good  Judgement:  Good  Insight:  Present  Psychomotor Activity:  NA  Concentration:  Concentration: Good and Attention Span: Good  Recall:  Good  Fund of Knowledge:  Good  Language:  Good  Akathisia:  No  Handed:  Right  AIMS (if indicated):     Assets:  Communication Skills Desire for Improvement Housing Resilience Social Support Transportation  ADL's:  Intact  Cognition:  WNL  Sleep:   better      Assessment and Plan: Major depressive disorder, recurrent.  Anxiety.  Patient is stable on his meds.  He recently finished 36 treatment of [place].  Excited about upcoming trip to [place] next week with another couple.  So far he is tolerating his medication and reported no side effects.  He does not need new Ativan because he has left over.  He does need a new prescription of Lexapro 30 mg.  Recommended to call us back if is any question or any concern.  Follow-up in 3 months.  Follow Up Instructions:    I discussed the assessment and treatment plan with the patient. The patient was provided an opportunity to ask questions and all were answered. The patient agreed with the plan and demonstrated an understanding of the instructions.   The patient was advised to call back or seek an in-person evaluation if the symptoms  worsen or if the condition fails to improve as anticipated.  Collaboration of Care: Other provider involved in patient's care AEB notes are available in epic to review.  Patient/Guardian was advised Release of Information must be obtained prior to any record release in order to collaborate their care with an outside provider. Patient/Guardian was advised if they have not already done so to contact the registration department to sign all necessary forms in order for [name] to release information regarding their care.   Consent: Patient/Guardian gives verbal consent for treatment and assignment of benefits  for services provided during this visit. Patient/Guardian expressed understanding and agreed to proceed.    I provided 22 minutes of non-face-to-face time during this encounter.   Kathlee Nations, MD

## 2022-08-01 NOTE — Progress Notes (Signed)
Patient reported to [facility] for his 6th and final taper session of Repetitive Transcranial Magnetic Stimulation treatment for severe episode of recurrent major depressive disorder, without psychotic features. Patient presented with appropriate affect, level mood and denied any suicidal or homicidal ideations. Patient denies any other current symptoms. Patient reported no change in alcohol/substance use, caffeine consumption. Pt reports having a nice birthday weekend; epnding time with friends.  Power was titrated to 120% for the duration of tx. Patient reported no complaints or discomfort. Patient departed post-treatment with no concerns or complaints. PHQ-9 a 0.

## 2022-08-03 DIAGNOSIS — H524 Presbyopia: Secondary | ICD-10-CM | POA: Diagnosis not present

## 2022-08-03 DIAGNOSIS — H53032 Strabismic amblyopia, left eye: Secondary | ICD-10-CM | POA: Diagnosis not present

## 2022-08-03 DIAGNOSIS — H43813 Vitreous degeneration, bilateral: Secondary | ICD-10-CM | POA: Diagnosis not present

## 2022-08-03 DIAGNOSIS — H2513 Age-related nuclear cataract, bilateral: Secondary | ICD-10-CM | POA: Diagnosis not present

## 2022-08-29 ENCOUNTER — Telehealth (HOSPITAL_BASED_OUTPATIENT_CLINIC_OR_DEPARTMENT_OTHER): Payer: Medicare Other | Admitting: Psychiatry

## 2022-08-29 ENCOUNTER — Encounter (HOSPITAL_COMMUNITY): Payer: Self-pay | Admitting: Psychiatry

## 2022-08-29 VITALS — Wt 139.0 lb

## 2022-08-29 DIAGNOSIS — F332 Major depressive disorder, recurrent severe without psychotic features: Secondary | ICD-10-CM | POA: Diagnosis not present

## 2022-08-29 DIAGNOSIS — F419 Anxiety disorder, unspecified: Secondary | ICD-10-CM

## 2022-08-29 MED ORDER — LORAZEPAM 0.5 MG PO TABS: 0.5000 mg | ORAL_TABLET | Freq: Two times a day (BID) | ORAL | 0 refills | Status: DC | PRN

## 2022-08-29 MED ORDER — LAMOTRIGINE 25 MG PO TABS: 25.0000 mg | ORAL_TABLET | Freq: Every day | ORAL | 0 refills | Status: DC

## 2022-08-29 NOTE — Progress Notes (Signed)
Virtual Visit via Telephone Note  I connected with [name] on 08/29/22 at  4:20 PM EDT by telephone and verified that I am speaking with the correct person using two identifiers.  Location: Patient: Home Provider: Home Office   I discussed the limitations, risks, security and privacy concerns of performing an evaluation and management service by telephone and the availability of in person appointments. I also discussed with the patient that there may be a patient responsible charge related to this service. The patient expressed understanding and agreed to proceed.   History of Present Illness: Patient is evaluated by phone session.  He requested an earlier appointment because he is feeling very anxious.  Patient recently had a trip to [place].  He stayed 3 days in a [place] and then 2 days at [employer].  Patient told the itinerary was very busy and he admitted feeling very anxious and tired.  When he arrived in [place] he was fine but later decided to have anxiety and took Ativan every day.  He admitted missing some site visits because he could not keep up very well.  However he denies any suicidal thoughts or homicidal thoughts.  Since he back to [place] he do feel some improvement but now taking about trying the Lamictal.  He does not want [place] as he has done 3 times and did not last long.  We have discussion of Lamictal in the past and his daughter-in-law who psychiatrist also recommend to consider but at that time patient was not interested but now he is willing to try it.  He is taking Lexapro 30 mg and Ativan 0.5 mg as needed.  Patient told that he will be dizzy next few weeks because his nephew coming from [place] and he will go with him to [place] and then to visit patient's sister who lives in [place].  He admitted there are nights when he does not sleep well but denies any hallucination, paranoia.  His energy level is low.  He is compliant with Lexapro and reported no tremors, shakes or  any EPS.  His weight is unchanged from the past.  Denies any panic attack.   Past Psychiatric History: Reviewed. H/O depression and anxiety most of his life.  Had a good response with TMS and Lexapro. Tried Celexa, Pristiq, Remeron, Cymbalta and Xanax but limited outcome. Klonopin and Buspar made groggy.  No h/o suicidal attempt, mania, psychosis or self abusive behavior.  Saw Dr. Cheryln Manly in the past for therapy.        Psychiatric Specialty Exam: Physical Exam  Review of Systems  Weight 139 lb (63 kg).There is no height or weight on file to calculate BMI.  General Appearance: NA  Eye Contact:  NA  Speech:  Normal Rate  Volume:  Decreased  Mood:  Anxious  Affect:  NA  Thought Process:  Descriptions of Associations: Intact  Orientation:  Full (Time, Place, and Person)  Thought Content:  Rumination  Suicidal Thoughts:  No  Homicidal Thoughts:  No  Memory:  Immediate;   Good Recent;   Good Remote;   Good  Judgement:  Intact  Insight:  Present  Psychomotor Activity:  NA  Concentration:  Concentration: Good and Attention Span: Good  Recall:  Good  Fund of Knowledge:  Good  Language:  Good  Akathisia:  No  Handed:  Right  AIMS (if indicated):     Assets:  Communication Skills Desire for Improvement Housing Social Support Transportation  ADL's:  Intact  Cognition:  WNL  Sleep:   fair      Assessment and Plan: Major depressive disorder, recurrent.  Anxiety.  Patient now like to try low-dose Lamictal since he started feeling anxious again.  His energy level is low.  He is tired and does not have motivation to do things.  He is taking Ativan almost every day.  We have discussion about low-dose Lamictal in the past but at that time he was not interested but now he will consider.  We discussed in detail about the medication side effect specially Lamictal can cause rash, itching tremors or shakes.  We will start 25 mg a day and I recommend should take at nighttime because  sometimes it does make him dizzy and sleepy.  He is not interested in doing and the [place] since he had done 3 times.  We talk about upcoming traveling with his nephew who is coming from [place] and patient will go to [place] and then [facility].  He understands if anxiety get overwhelmed and he was not travel.  I recommend to continue Ativan as needed for severe anxiety, continue Lexapro 30 mg daily and we will start Lamictal 25 mg daily.  I recommend to call us back if there is any question, concern or if he feels worsening of the symptoms.  Follow up in 4 weeks.  Follow Up Instructions:    I discussed the assessment and treatment plan with the patient. The patient was provided an opportunity to ask questions and all were answered. The patient agreed with the plan and demonstrated an understanding of the instructions.   The patient was advised to call back or seek an in-person evaluation if the symptoms worsen or if the condition fails to improve as anticipated.  Collaboration of Care: Other provider involved in patient's care AEB notes are available in epic to review.  Patient/Guardian was advised Release of Information must be obtained prior to any record release in order to collaborate their care with an outside provider. Patient/Guardian was advised if they have not already done so to contact the registration department to sign all necessary forms in order for [name] to release information regarding their care.   Consent: Patient/Guardian gives verbal consent for treatment and assignment of benefits for services provided during this visit. Patient/Guardian expressed understanding and agreed to proceed.    I provided 22 minutes of non-face-to-face time during this encounter.   Kathlee Nations, MD

## 2022-09-04 ENCOUNTER — Other Ambulatory Visit: Payer: Self-pay | Admitting: Family Medicine

## 2022-09-05 NOTE — Telephone Encounter (Signed)
Pt called stating that the Ativan 0.5 mg is not working very well for him anymore.pt is requesting to go back on Klonopin. Pt was previously taking 0.5 mg BID. Pt also wanted to ask about increasing the Lamictal, which he is tolerating well. I did not see an increase in the plan. Pt last seen 08/29/22. Next appointment is scheduled 09/29/22. Please review and advise.

## 2022-09-05 NOTE — Telephone Encounter (Signed)
He can try Lamictal 50 mg daily if he has no rash.  I will suggest to take 2 tablets of Ativan 0.5 mg together rather than switching to Klonopin.

## 2022-09-07 ENCOUNTER — Other Ambulatory Visit (HOSPITAL_COMMUNITY): Payer: Self-pay | Admitting: *Deleted

## 2022-09-07 DIAGNOSIS — F332 Major depressive disorder, recurrent severe without psychotic features: Secondary | ICD-10-CM

## 2022-09-07 DIAGNOSIS — F419 Anxiety disorder, unspecified: Secondary | ICD-10-CM

## 2022-09-07 MED ORDER — CLONAZEPAM 0.5 MG PO TABS: ORAL_TABLET | ORAL | 0 refills | Status: DC

## 2022-09-07 MED ORDER — LAMOTRIGINE 25 MG PO TABS: 50.0000 mg | ORAL_TABLET | Freq: Every day | ORAL | 0 refills | Status: DC

## 2022-09-07 NOTE — Telephone Encounter (Signed)
Please send Klonopin 0.5 mg #20 to take as needed for severe panic attack.  He need to stop the Ativan.

## 2022-09-07 NOTE — Telephone Encounter (Signed)
Will do. Will notify pt.

## 2022-09-07 NOTE — Telephone Encounter (Signed)
Pt understands to increase Lamictal. Pt advised if develops rash to stop medication and call this nurse/office. But he says he's been taking 1 mg of Ativan and it's not helping; would prefer Klonopin. Pt going out of town tomorrow.

## 2022-09-29 ENCOUNTER — Telehealth (HOSPITAL_BASED_OUTPATIENT_CLINIC_OR_DEPARTMENT_OTHER): Payer: Medicare Other | Admitting: Psychiatry

## 2022-09-29 ENCOUNTER — Other Ambulatory Visit: Payer: Self-pay | Admitting: Family Medicine

## 2022-09-29 ENCOUNTER — Encounter (HOSPITAL_COMMUNITY): Payer: Self-pay | Admitting: Psychiatry

## 2022-09-29 DIAGNOSIS — F419 Anxiety disorder, unspecified: Secondary | ICD-10-CM | POA: Diagnosis not present

## 2022-09-29 DIAGNOSIS — F332 Major depressive disorder, recurrent severe without psychotic features: Secondary | ICD-10-CM | POA: Diagnosis not present

## 2022-09-29 MED ORDER — LAMOTRIGINE 25 MG PO TABS: 50.0000 mg | ORAL_TABLET | Freq: Every day | ORAL | 1 refills | Status: DC

## 2022-09-29 NOTE — Progress Notes (Signed)
Virtual Visit via Telephone Note  I connected with [name] on 09/29/22 at  2:00 PM EST by telephone and verified that I am speaking with the correct person using two identifiers.  Location: Patient: Home Provider: Home Office   I discussed the limitations, risks, security and privacy concerns of performing an evaluation and management service by telephone and the availability of in person appointments. I also discussed with the patient that there may be a patient responsible charge related to this service. The patient expressed understanding and agreed to proceed.   History of Present Illness: Patient is evaluated by phone session  Now he is taking Lamictal 50 mg daily and noticed improvement in his anxiety and mood.  On Thanksgiving he had to attend a very of his friend's daughter and he really enjoyed the event.  He denies any panic attack, crying spells or any feeling of hopelessness.  He has not taken Klonopin but he had picked up in case he needed.  He denies any dizziness, rash, itching.  He is hoping to have a good Christmas because family coming from multiple places.  He is sleeping good.  His energy level is good.  He is compliant with Lexapro.  He want to keep the Lamictal along with Lexapro.    Past Psychiatric History: Reviewed. H/O depression and anxiety most of his life.  Had a good response with TMS and Lexapro. Tried Celexa, Pristiq, Remeron, Cymbalta and Xanax but limited outcome. Klonopin and Buspar made groggy.  No h/o suicidal attempt, mania, psychosis or self abusive behavior.  Saw Dr. Cheryln Manly in the past for therapy.     Psychiatric Specialty Exam: Physical Exam  Review of Systems  Weight 139 lb (63 kg).There is no height or weight on file to calculate BMI.  General Appearance: NA  Eye Contact:  NA  Speech:  NA  Volume:  Normal  Mood:  Euthymic  Affect:  NA  Thought Process:  Goal Directed  Orientation:  Full (Time, Place, and Person)  Thought Content:   WDL  Suicidal Thoughts:  No  Homicidal Thoughts:  No  Memory:  Immediate;   Good Recent;   Good Remote;   Good  Judgement:  Intact  Insight:  Present  Psychomotor Activity:  NA  Concentration:  Concentration: Good and Attention Span: Good  Recall:  Good  Fund of Knowledge:  Good  Language:  Good  Akathisia:  No  Handed:  Right  AIMS (if indicated):     Assets:  Communication Skills Desire for Improvement Housing Resilience Social Support Transportation  ADL's:  Intact  Cognition:  WNL  Sleep:   ok      Assessment and Plan: Major depressive disorder, recurrent.  Anxiety.  Patient now taking Lamictal 25 mg 2 times a day and he noticed improvement in his mood.  He denies any panic attack.  I encouraged to keep the current medication along with Lexapro.  He had picked up the Klonopin but has not tried yet because his anxiety is under control.  Recommended to call us back if is any question or any concern.  Follow-up in 3 months.  Follow Up Instructions:    I discussed the assessment and treatment plan with the patient. The patient was provided an opportunity to ask questions and all were answered. The patient agreed with the plan and demonstrated an understanding of the instructions.   The patient was advised to call back or seek an in-person evaluation if the symptoms worsen  or if the condition fails to improve as anticipated.  I provided 15 minutes of non-face-to-face time during this encounter.   Kathlee Nations, MD

## 2022-10-03 DIAGNOSIS — N281 Cyst of kidney, acquired: Secondary | ICD-10-CM | POA: Diagnosis not present

## 2022-10-03 DIAGNOSIS — R338 Other retention of urine: Secondary | ICD-10-CM | POA: Diagnosis not present

## 2022-10-22 ENCOUNTER — Other Ambulatory Visit: Payer: Self-pay | Admitting: Family Medicine

## 2022-10-29 ENCOUNTER — Other Ambulatory Visit (HOSPITAL_COMMUNITY): Payer: Self-pay | Admitting: Psychiatry

## 2022-10-29 DIAGNOSIS — F332 Major depressive disorder, recurrent severe without psychotic features: Secondary | ICD-10-CM

## 2022-10-31 ENCOUNTER — Other Ambulatory Visit: Payer: Self-pay | Admitting: Family Medicine

## 2022-11-01 ENCOUNTER — Telehealth (HOSPITAL_COMMUNITY): Payer: Medicare Other | Admitting: Psychiatry

## 2022-11-04 ENCOUNTER — Ambulatory Visit: Payer: Medicare Other

## 2022-11-04 DIAGNOSIS — I6523 Occlusion and stenosis of bilateral carotid arteries: Secondary | ICD-10-CM

## 2022-11-14 ENCOUNTER — Ambulatory Visit: Payer: Medicare Other | Admitting: Cardiology

## 2022-11-14 ENCOUNTER — Encounter: Payer: Self-pay | Admitting: Cardiology

## 2022-11-14 VITALS — BP 135/66 | HR 76 | Resp 16 | Ht 67.0 in | Wt 141.8 lb

## 2022-11-14 DIAGNOSIS — I351 Nonrheumatic aortic (valve) insufficiency: Secondary | ICD-10-CM

## 2022-11-14 DIAGNOSIS — I10 Essential (primary) hypertension: Secondary | ICD-10-CM | POA: Diagnosis not present

## 2022-11-14 DIAGNOSIS — I6523 Occlusion and stenosis of bilateral carotid arteries: Secondary | ICD-10-CM | POA: Diagnosis not present

## 2022-11-14 DIAGNOSIS — E7800 Pure hypercholesterolemia, unspecified: Secondary | ICD-10-CM | POA: Diagnosis not present

## 2022-11-14 NOTE — Progress Notes (Signed)
Primary Physician/Referring:  [name], MD  Patient ID: [name], male    DOB: 1935-11-10, 87 y.o.   MRN: [id number]  Chief Complaint  Patient presents with   Carotid stenosis   Valve disease   Follow-up    6 months   HPI:    [name]  is a 87 y.o. male with History of hypertension, hyperlipidemia, prediabetes, mild sleep apnea (not on CPAP), GERD, mild chronic hyponatremia and moderate aortic regurgitation presents here for a 7-monthoffice visit.  Patient is feeling well overall without specific complaints today.  He remains fairly active walking and golfing on a regular basis without issue.  He is accompanied by his wife.  Past Medical History:  Diagnosis Date   Anxiety    BPH (benign prostatic hypertrophy)    DDD (degenerative disc disease), lumbar 2002   Depression    Difficult intubation 02/22/2019   Anterior with limited oral opening   Essential hypertension 03/10/2019   GERD (gastroesophageal reflux disease)    History of colon polyps    History of hiatal hernia 2012   Hyperlipemia    Mild   Indwelling Foley catheter present    Iron deficiency anemia    IV iron therapy    Mild hyperlipidemia 02/20/2008   Qualifier: Diagnosis of  By: SLeanne ChangMD, Bruce     Pre-diabetes    Renal cyst 2008   7.6 cm lower pole right renal cyst   Sleep apnea    slight, no CPAP used   Past Surgical History:  Procedure Laterality Date   COLONOSCOPY  08/2011   NASAL SEPTOPLASTY W/ TURBINOPLASTY  08/03/2012   Procedure: NASAL SEPTOPLASTY WITH TURBINATE REDUCTION;  Surgeon: JIzora Gala MD;  [facility] [facility]  Service: ENT;  Laterality: Bilateral;   UPPER GASTROINTESTINAL ENDOSCOPY  08/2011   XI ROBOTIC ASSISTED SIMPLE PROSTATECTOMY N/A 02/22/2019   Procedure: XI ROBOTIC ASSISTED SIMPLE PROSTATECTOMY;  Surgeon: MAlexis Frock MD;  Location: [facility];  Service: Urology;  Laterality: N/A;     Social History   Tobacco Use   Smoking status: Former    Packs/day: 0.50     Years: 10.00    Total pack years: 5.00    Types: Cigarettes    Quit date: 1969    Years since quitting: 55.0   Smokeless tobacco: Never   Tobacco comments:    rare use over 50 years   Substance Use Topics   Alcohol use: Yes    Alcohol/week: 1.0 standard drink of alcohol    Types: 1 Shots of liquor per week    Comment: weekly   Marital Status: Married   ROS  Review of Systems  Constitutional: Negative for malaise/fatigue and weight gain.  Cardiovascular:  Negative for chest pain, claudication, dyspnea on exertion, leg swelling, near-syncope, orthopnea, palpitations, paroxysmal nocturnal dyspnea and syncope.  Neurological:  Negative for dizziness.    Objective  Blood pressure 135/66, pulse 76, resp. rate 16, height '5\' 7"'$  (1.702 m), weight 141 lb 12.8 oz (64.3 kg), SpO2 99 %.     11/14/2022    9:45 AM 09/29/2022    2:08 PM 08/29/2022    3:12 PM  Vitals with BMI  Height '5\' 7"'$     Weight 141 lbs 13 oz    BMI 244.3   Systolic 1154   Diastolic 66    Pulse 76       Information is confidential and restricted. Go to Review Flowsheets to unlock data.  Physical Exam Vitals reviewed.  Neck:     Vascular: Carotid bruit (soft bruit) present. No JVD.  Cardiovascular:     Rate and Rhythm: Normal rate and regular rhythm.     Pulses: Intact distal pulses.     Heart sounds: S1 normal and S2 normal. Murmur heard.     Midsystolic murmur is present at the apex.     Blowing early diastolic murmur is present with a grade of 2/4.     No gallop.  Pulmonary:     Effort: Pulmonary effort is normal. No respiratory distress.     Breath sounds: No wheezing, rhonchi or rales.  Musculoskeletal:     Right lower leg: No edema.     Left lower leg: No edema.  Neurological:     Mental Status: He is alert.    Laboratory examination:   Lab Results  Component Value Date   NA 133 (L) 06/03/2022   K 4.2 06/03/2022   CO2 28 06/03/2022   GLUCOSE 103 (H) 06/03/2022   BUN 18 06/03/2022    CREATININE 0.87 06/03/2022   CALCIUM 8.9 06/03/2022   GFRNONAA >60 03/26/2019       Latest Ref Rng & Units 06/03/2022    8:25 AM 10/12/2021    4:03 PM 02/17/2021   11:02 AM  CMP  Glucose 70 - 99 mg/dL 103  91  101   BUN 6 - 23 mg/dL '18  20  14   '$ Creatinine 0.40 - 1.50 mg/dL 0.87  0.78  0.77   Sodium 135 - 145 mEq/L 133  132  134   Potassium 3.5 - 5.1 mEq/L 4.2  4.0  4.6   Chloride 96 - 112 mEq/L 100  99  100   CO2 19 - 32 mEq/L '28  27  27   '$ Calcium 8.4 - 10.5 mg/dL 8.9  8.9  8.8   Total Protein 6.0 - 8.3 g/dL 6.8   6.6   Total Bilirubin 0.2 - 1.2 mg/dL 0.5   0.6   Alkaline Phos 39 - 117 U/L 52   56   AST 0 - 37 U/L 16   14   ALT 0 - 53 U/L 21   15       Latest Ref Rng & Units 02/17/2021   11:02 AM 12/12/2019    9:33 AM 09/25/2019   11:02 AM  CBC  WBC 4.0 - 10.5 K/uL 5.8  5.5  6.2   Hemoglobin 13.0 - 17.0 g/dL 12.8  13.1  12.4   Hematocrit 39.0 - 52.0 % 39.6  40.9  38.7   Platelets 150.0 - 400.0 K/uL 213.0  197.0  255    Lipid Panel Recent Labs    06/03/22 0825  CHOL 130  TRIG 48.0  LDLCALC 63  VLDL 9.6  HDL 57.60  CHOLHDL 2   HEMOGLOBIN A1C Lab Results  Component Value Date   HGBA1C 6.1 06/03/2022   MPG 116.89 02/20/2019   Lab Results  Component Value Date   TSH 3.02 02/17/2021   Allergies   Allergies  Allergen Reactions   Dexlansoprazole     Other reaction(s): Unknown    Final Medications at End of Visit     Current Outpatient Medications:    acetaminophen (TYLENOL) 500 MG tablet, Take 1,000 mg by mouth every 6 (six) hours as needed for moderate pain or headache., Disp: , Rfl:    amLODipine (NORVASC) 10 MG tablet, TAKE 1 TABLET(10 MG) BY MOUTH DAILY, Disp: 90 tablet, Rfl:  0   aspirin EC 81 MG tablet, Take 1 tablet (81 mg total) by mouth daily. Swallow whole., Disp: 90 tablet, Rfl: 3   atorvastatin (LIPITOR) 10 MG tablet, Take 1 tablet (10 mg total) by mouth daily., Disp: 90 tablet, Rfl: 3   clobetasol cream (TEMOVATE) 9.62 %, Apply 1 application   topically 2 (two) times daily., Disp: , Rfl:    clonazePAM (KLONOPIN) 0.5 MG tablet, Take one tablet (0.5 mg total) per day as needed for panic attacks/severe anxiety., Disp: 20 tablet, Rfl: 0   escitalopram (LEXAPRO) 20 MG tablet, TAKE 1 AND 1/2 TABLETS BY MOUTH DAILY, Disp: 45 tablet, Rfl: 1   finasteride (PROSCAR) 5 MG tablet, Take 5 mg by mouth daily., Disp: , Rfl:    hydrocortisone 2.5 % cream, Apply topically 2 (two) times daily as needed. (Patient taking differently: Apply 1 application  topically 2 (two) times daily as needed (rash).), Disp: 30 g, Rfl: 1   lamoTRIgine (LAMICTAL) 25 MG tablet, Take 2 tablets (50 mg total) by mouth daily., Disp: 60 tablet, Rfl: 1   omeprazole (PRILOSEC) 40 MG capsule, TAKE 1 CAPSULE BY MOUTH DAILY, Disp: 90 capsule, Rfl: 0   valsartan (DIOVAN) 160 MG tablet, TAKE 1 TABLET(160 MG) BY MOUTH DAILY, Disp: 90 tablet, Rfl: 1   Radiology:   No results found.  Cardiac Studies:   PCV ECHOCARDIOGRAM COMPLETE 22/97/9892 Normal LV systolic function with EF 71%. Left ventricle cavity is normal in size. Normal left ventricular wall thickness. Normal global wall motion. Normal diastolic filling pattern. Calculated EF 71%. Left atrial cavity is mildly dilated at 4.1 cm. Mild aortic valve leaflet thickening with mild calcification. Trileaflet aortic valve. No evidence of aortic stenosis. Moderate (Grade II) aortic regurgitation. Mildly restricted aortic valve leaflets. Structurally normal mitral valve.  Mild (Grade I) mitral regurgitation. Structurally normal tricuspid valve.  Mild tricuspid regurgitation. No evidence of pulmonary hypertension. RVSP measures 30 mmHg. The aortic root is normal. Borderline dilated ascending aorta at 3.9 cm.  Carotid artery duplex 11/04/2022:  Duplex suggests stenosis in the right internal carotid artery (50-69%).  Homogeneous plaque noted.  Duplex suggests stenosis in the left internal carotid artery (minimal).  Antegrade right  vertebral artery flow. Antegrade left vertebral artery flow.  Follow up in six months is appropriate if clinically indicated.    EKG:   EKG 11/13/2022: Normal sinus rhythm at a rate of 71 bpm, normal axis, no evidence of ischemia.  Normal EKG.  Compared to 02/16/2022, no significant change.  Assessment     ICD-10-CM   1. Essential hypertension  I10 EKG 12-Lead    2. Moderate aortic regurgitation  I35.1 PCV ECHOCARDIOGRAM COMPLETE    3. Hypercholesteremia  E78.00     4. Bilateral carotid artery stenosis  I65.23 PCV CAROTID DUPLEX (BILATERAL)      There are no discontinued medications.   No orders of the defined types were placed in this encounter.  Recommendations:   [name] is a 87 y.o. male with History of hypertension, hyperlipidemia, prediabetes, mild sleep apnea (not on CPAP), GERD, mild chronic hyponatremia and moderate aortic regurgitation presents here for a 60-monthoffice visit.  1. Essential hypertension Blood pressure is well controlled with present medical regimen, continue the same.  Renal function is normal.  2. Moderate aortic regurgitation No clinical evidence of heart failure, no change in aortic regurgitation murmur in the left parasternal border.  In the absence of heart failure symptoms, dyspnea, I will repeat his echocardiogram in 1  year from now before his next office visit.  He is advised to contact me if he were to develop any dyspnea or chest pain or dizziness.  3. Hypercholesteremia Lipids under excellent control with minimal dose of statin, being done in view of significant soft plaque in his carotid arteries.  LDL is now <70.  Continue low-dose statin.  4. Bilateral carotid artery stenosis Reviewed the results of the carotid artery duplex, moderate disease on the right and minimal disease on the left, mostly soft plaque.  Continue statin and aspirin as well.  We will repeat his carotid artery duplex in 6 months.  Overall he is doing well from  cardiovascular standpoint, I would like to see him back in a year.   Adrian Prows, MD, [facility] 11/14/2022, 10:12 AM Office: [phone] Fax: [phone] Pager: [phone]

## 2022-11-24 ENCOUNTER — Telehealth: Payer: Self-pay

## 2022-11-24 NOTE — Telephone Encounter (Signed)
Unsuccessful attempt to reach patient on preferred number listed in notes for scheduled AWV. Left message on voicemail okay to reschedule. 

## 2022-11-26 ENCOUNTER — Other Ambulatory Visit (HOSPITAL_COMMUNITY): Payer: Self-pay | Admitting: Psychiatry

## 2022-11-26 DIAGNOSIS — F332 Major depressive disorder, recurrent severe without psychotic features: Secondary | ICD-10-CM

## 2022-11-26 DIAGNOSIS — F419 Anxiety disorder, unspecified: Secondary | ICD-10-CM

## 2022-12-06 ENCOUNTER — Telehealth (INDEPENDENT_AMBULATORY_CARE_PROVIDER_SITE_OTHER): Payer: Medicare Other | Admitting: Family Medicine

## 2022-12-06 DIAGNOSIS — Z0000 Encounter for general adult medical examination without abnormal findings: Secondary | ICD-10-CM

## 2022-12-06 NOTE — Progress Notes (Signed)
PATIENT CHECK-IN and HEALTH RISK ASSESSMENT QUESTIONNAIRE:  -completed by phone/video for upcoming Medicare Preventive Visit  Pre-Visit Check-in: 1)Vitals (height, wt, BP, etc) - record in vitals section for visit on day of visit 2)Review and Update Medications, Allergies PMH, Surgeries, Social history in Epic 3)Hospitalizations in the last year with date/reason?  No 4)Review and Update Care Team (patient's specialists) in Epic 5) Complete PHQ9 in Epic  6) Complete Fall Screening in Epic 7)Review all Health Maintenance Due and order under PCP if not done.  Medicare Wellness Patient Questionnaire:  Answer theses question about your habits: Do you drink alcohol? Yes If yes, how many drinks do you have a day?Scotch, 1 maybe 2 times per week Have you ever smoked?Yes  Quit date if applicable?  stopped 55 years ago How many packs a day do/did you smoke? N/A Do you use smokeless tobacco?No Do you use an illicit drugs?No Do you exercises? Yes  IF so, what type and how many days/minutes per week?4 times per week, 40 mins; goes to the gym, does cardio and lifting, balance is good Reports has good social connections on regular basis, stays active Are you sexually active? No Number of partners?N/A Mostly eats at home, eats lots of fruits and veggies Typical breakfast: Toast, Egg, coffee, fruits Typical lunch: sandwich Typical dinner: meat, vegs Typical snacks:cookies  Beverages: tea, coffee, drinks water  Answer theses question about you: Can you perform most household chores?yes Do you find it hard to follow a conversation in a noisy room?No Do you often ask people to speak up or repeat themselves?No  Do you feel that you have a problem with memory?No  Do you balance your checkbook and or bank acounts?Yes Do you feel safe at home?Yes Last dentist visit?3 days  Do you need assistance with any of the following: Please note if so No assistance needed  Driving?  Feeding yourself?  Getting  from bed to chair?  Getting to the toilet?  Bathing or showering?  Dressing yourself?  Managing money?  Climbing a flight of stairs  Preparing meals?    Do you have Advanced Directives in place (Living Will, Healthcare Power or Attorney)? Yes   Last eye Exam and location?3 months, [place]   Do you currently use prescribed or non-prescribed narcotic or opioid pain medications?No  Do you have a history or close family history of breast, ovarian, tubal or peritoneal cancer or a family member with BRCA (breast cancer susceptibility 1 and 2) gene mutations?  Nurse/Assistant Credentials/time stamp:   ----------------------------------------------------------------------------------------------------------------------------------------------------------------------------------------------------------------------    MEDICARE ANNUAL PREVENTIVE CARE VISIT WITH PROVIDER (Welcome to Medicare, initial annual wellness or annual wellness exam)  Virtual Visit via Phone Note  I connected with [name]  on 12/06/22 by phone and verified that I am speaking with the correct person using two identifiers.  Location patient: home Location provider:work or home office Persons participating in the virtual visit: patient, provider  Concerns and/or follow up today: reports everything is stable, saw cardiologist and had CA [name] and stable. Reports mood is much better - seeing psych and started lamictal recently.    See HM section in Epic for other details of completed HM.    ROS: negative for report of fevers, unintentional weight loss, vision changes, vision loss, hearing loss or change, chest pain, sob, hemoptysis, melena, hematochezia, hematuria, genital discharge or lesions, falls, bleeding or bruising, loc, memory loss  Patient-completed extensive health risk assessment - reviewed and discussed with the patient: See Health Risk Assessment completed with  patient prior to the visit either above or  in recent phone note. This was reviewed in detailed with the patient today and appropriate recommendations, orders and referrals were placed as needed per Summary below and patient instructions.   Review of Medical History: -PMH, PSH, Family History and current specialty and care providers reviewed and updated and listed below   Patient Care Team: [name], MD as PCP - General (Family Medicine) Juanita Craver, MD as Consulting Physician (Gastroenterology) Calvert Cantor, MD as Consulting Physician (Ophthalmology) Kathlee Nations, MD as Consulting Physician (Psychiatry) Nicholas Lose, MD as Consulting Physician (Hematology and Oncology)   Past Medical History:  Diagnosis Date   Anxiety    BPH (benign prostatic hypertrophy)    DDD (degenerative disc disease), lumbar 2002   Depression    Difficult intubation 02/22/2019   Anterior with limited oral opening   Essential hypertension 03/10/2019   GERD (gastroesophageal reflux disease)    History of colon polyps    History of hiatal hernia 2012   Hyperlipemia    Mild   Indwelling Foley catheter present    Iron deficiency anemia    IV iron therapy    Mild hyperlipidemia 02/20/2008   Qualifier: Diagnosis of  By: Leanne Chang MD, Bruce     Pre-diabetes    Renal cyst 2008   7.6 cm lower pole right renal cyst   Sleep apnea    slight, no CPAP used    Past Surgical History:  Procedure Laterality Date   COLONOSCOPY  08/2011   NASAL SEPTOPLASTY W/ TURBINOPLASTY  08/03/2012   Procedure: NASAL SEPTOPLASTY WITH TURBINATE REDUCTION;  Surgeon: Izora Gala, MD;  Location: [facility];  Service: ENT;  Laterality: Bilateral;   UPPER GASTROINTESTINAL ENDOSCOPY  08/2011   XI ROBOTIC ASSISTED SIMPLE PROSTATECTOMY N/A 02/22/2019   Procedure: XI ROBOTIC ASSISTED SIMPLE PROSTATECTOMY;  Surgeon: Alexis Frock, MD;  Location: [facility];  Service: Urology;  Laterality: N/A;    Social History   Socioeconomic History   Marital status: Married    Spouse name:  Not on file   Number of children: 2   Years of education: Not on file   Highest education level: Not on file  Occupational History   Not on file  Tobacco Use   Smoking status: Former    Packs/day: 0.50    Years: 10.00    Total pack years: 5.00    Types: Cigarettes    Quit date: 63    Years since quitting: 55.1   Smokeless tobacco: Never   Tobacco comments:    rare use over 50 years   Vaping Use   Vaping Use: Never used  Substance and Sexual Activity   Alcohol use: Yes    Alcohol/week: 1.0 standard drink of alcohol    Types: 1 Shots of liquor per week    Comment: weekly   Drug use: No   Sexual activity: Never    Birth control/protection: None  Other Topics Concern   Not on file  Social History Narrative   Lives in 2 level    Will stay for now    Just moved here approx 1.5 yo   Very nice neighbors    [employer]  x 5 with a very busy holiday   One son is a Teacher, music and nephew is a Investment banker, operational    Son in [place] moved to [place] and he misses his grand children    As 2 brothers in [place]  Retired from [employer], [employer]       Social Determinants of Petros Strain: [place]  (11/23/2021)   Overall Financial Resource Strain (CARDIA)    Difficulty of Paying Living Expenses: Not hard at all  Food Insecurity: No Food Insecurity (11/23/2021)   Hunger Vital Sign    Worried About Running Out of Food in the Last Year: Never true    [place] in the Last Year: Never true  Transportation Needs: No Transportation Needs (11/23/2021)   PRAPARE - Hydrologist (Medical): No    Lack of Transportation (Non-Medical): No  Physical Activity: Sufficiently Active (11/23/2021)   Exercise Vital Sign    Days of Exercise per Week: 4 days    Minutes of Exercise per Session: 40 min  Stress: No Stress Concern Present (11/23/2021)   [place]    Feeling of Stress : Only a little   Social Connections: Moderately Isolated (11/23/2021)   Social Connection and Isolation Panel [NHANES]    Frequency of Communication with Friends and Family: More than three times a week    Frequency of Social Gatherings with Friends and Family: More than three times a week    Attends Religious Services: Never    Marine scientist or Organizations: No    Attends Archivist Meetings: Never    Marital Status: Married  Human resources officer Violence: Not At Risk (11/23/2021)   Humiliation, Afraid, Rape, and Kick questionnaire    Fear of Current or Ex-Partner: No    Emotionally Abused: No    Physically Abused: No    Sexually Abused: No    Family History  Problem Relation Age of Onset   Heart attack Father    Hypertension Father    Hypertension Daughter    Hypertension Brother    Hypertension Brother    Hypertension Sister     Current Outpatient Medications on File Prior to Visit  Medication Sig Dispense Refill   acetaminophen (TYLENOL) 500 MG tablet Take 1,000 mg by mouth every 6 (six) hours as needed for moderate pain or headache.     amLODipine (NORVASC) 10 MG tablet TAKE 1 TABLET(10 MG) BY MOUTH DAILY 90 tablet 0   aspirin EC 81 MG tablet Take 1 tablet (81 mg total) by mouth daily. Swallow whole. 90 tablet 3   atorvastatin (LIPITOR) 10 MG tablet Take 1 tablet (10 mg total) by mouth daily. 90 tablet 3   clobetasol cream (TEMOVATE) 1.19 % Apply 1 application  topically 2 (two) times daily.     clonazePAM (KLONOPIN) 0.5 MG tablet Take one tablet (0.5 mg total) per day as needed for panic attacks/severe anxiety. 20 tablet 0   escitalopram (LEXAPRO) 20 MG tablet TAKE 1 AND 1/2 TABLETS BY MOUTH DAILY 45 tablet 1   finasteride (PROSCAR) 5 MG tablet Take 5 mg by mouth daily.     hydrocortisone 2.5 % cream Apply topically 2 (two) times daily as needed. (Patient taking differently: Apply 1 application  topically 2 (two) times daily as needed (rash).) 30 g 1   lamoTRIgine (LAMICTAL)  25 MG tablet Take 2 tablets (50 mg total) by mouth daily. 60 tablet 1   omeprazole (PRILOSEC) 40 MG capsule TAKE 1 CAPSULE BY MOUTH DAILY 90 capsule 0   valsartan (DIOVAN) 160 MG tablet TAKE 1 TABLET(160 MG) BY MOUTH DAILY 90 tablet 1   No current facility-administered medications on file  prior to visit.    Allergies  Allergen Reactions   Dexlansoprazole     Other reaction(s): Unknown       Physical Exam There were no vitals filed for this visit. Estimated body mass index is 22.21 kg/m as calculated from the following:   Height as of 11/14/22: '5\' 7"'$  (1.702 m).   Weight as of 11/14/22: 141 lb 12.8 oz (64.3 kg).  EKG (optional): deferred due to virtual visit  GENERAL: alert, oriented, no acute distress detected; full vision exam deferred due to pandemic and/or virtual encounter  PSYCH/NEURO: pleasant and cooperative, no obvious depression or anxiety, speech and thought processing grossly intact, Cognitive function grossly intact  Flowsheet Row Video Visit from 12/06/2022 in [facility]  PHQ-9 Total Score 7           12/06/2022    5:04 PM 07/08/2022    9:32 AM 07/01/2022   10:31 AM 06/24/2022   10:32 AM 06/17/2022    9:32 AM  Depression screen PHQ 2/9  Decreased Interest 0      Down, Depressed, Hopeless 2      PHQ - 2 Score 2      Altered sleeping 0      Tired, decreased energy 0      Change in appetite 2      Feeling bad or failure about yourself  0      Trouble concentrating 3      Moving slowly or fidgety/restless 0      Suicidal thoughts 0      PHQ-9 Score 7      Difficult doing work/chores Not difficult at all         Information is confidential and restricted. Go to Review Flowsheets to unlock data.       12/14/2020    2:06 PM 10/12/2021    4:06 PM 11/23/2021   10:01 AM 11/21/2022    8:22 AM 12/06/2022    5:04 PM  Fall Risk  Falls in the past year? 0 0 0 0 0  Was there an injury with Fall? 0  0 0 0  Fall Risk Category Calculator 0   0  0  Fall Risk Category (Retired) Low  Low    (RETIRED) Patient Fall Risk Level Low fall risk  Low fall risk    Patient at Risk for Falls Due to No Fall Risks      Fall risk Follow up Falls evaluation completed;Falls prevention discussed         SUMMARY AND PLAN:  Encounter for Medicare annual wellness exam   Discussed applicable health maintenance/preventive health measures and advised and referred or ordered per patient preferences:  Health Maintenance  Topic Date Due   Zoster Vaccines- Shingrix (1 of 2) Never done, reports did this at pharmacy, requested he provide copy of record to PCP so could update if possible   COVID-19 Vaccine (5 - 2023-24 season) 09/28/2022, reports did latest booster   DTaP/Tdap/Td (3 - Tdap) 10/15/2023   Medicare Annual Wellness (AWV)  12/07/2023   Pneumonia Vaccine 57+ Years old  Completed   INFLUENZA VACCINE  Completed   HPV VACCINES  Aged Out    Education and counseling on the following was provided based on the above review of health and a plan/checklist for the patient, along with additional information discussed, was provided for the patient in the patient instructions :    -Congratulated and supported on regular exercise, guidelines and resources provided -  discussed heat/brain supportive healthy diet and info provided -he sees psychiatry for depression and feels has improved significantly -Advise yearly dental visits at minimum and regular eye exams -info on alcohol limits provided - see pt instructions  Follow up: see patient instructions   Patient Instructions  I really enjoyed getting to talk with you today! I am available on Tuesdays and Thursdays for virtual visits if you have any questions or concerns, or if I can be of any further assistance.   CHECKLIST FROM ANNUAL WELLNESS VISIT:  -Follow up (please call to schedule if not scheduled after visit):   -yearly for annual wellness visit with primary care office  Here is a list  of your preventive care/health maintenance measures and the plan for each if any are due:  Health Maintenance  Topic Date Due   Zoster Vaccines- Shingrix (1 of 2) Please provide record/dates so that we can update in your chart.   COVID-19 Vaccine (5 - 2023-24 season) Due yearly   DTaP/Tdap/Td (3 - Tdap) 10/15/2023   Medicare Annual Wellness (AWV)  12/07/2023   Pneumonia Vaccine 75+ Years old  Completed   INFLUENZA VACCINE  Completed, due yearly   HPV VACCINES  Aged Out    -See a dentist at least yearly  -Get your eyes checked and then per your eye specialist's recommendations  -Other issues addressed today:  -I have included below further information regarding a healthy whole foods based diet, physical activity guidelines for adults, stress management and opportunities for social connections. I hope you find this information useful.     NUTRITION: -eat real food: lots of colorful vegetables (half the plate) and fruits -5-7 servings of vegetables and fruits per day (fresh or steamed is best), exp. 2 servings of vegetables with lunch and dinner and 2 servings of fruit per day. Berries and greens such as kale and collards are great choices.  -consume on a regular basis: whole grains (make sure first ingredient on label contains the word "whole"), fresh fruits, fish, nuts, seeds, healthy oils (such as olive oil, avocado oil, grape seed oil) -may eat small amounts of dairy and lean meat on occasion, but avoid processed meats such as ham, bacon, lunch meat, etc. -drink water -try to avoid fast food and pre-packaged foods, processed meat -most experts advise limiting sodium to < '2300mg'$  per day, should limit further is any chronic conditions such  as high blood pressure, heart disease, diabetes, etc. The American Heart Association advised that < '1500mg'$  is is ideal -try to avoid foods that contain any ingredients with names you do not recognize  -try to avoid sugar/sweets (except for the natural sugar that occurs in fresh fruit) -try to avoid sweet drinks -try to avoid white rice, white bread, pasta (unless whole grain), white or yellow potatoes  EXERCISE GUIDELINES FOR ADULTS: -if you wish to increase your physical activity, do so gradually and with the approval of your doctor -STOP and seek medical care immediately if you have any chest pain, chest discomfort or trouble breathing when starting or increasing exercise  -move and stretch your body, legs, feet and arms when sitting for long periods -Physical activity guidelines for optimal health in adults: -least 150 minutes per week of aerobic exercise (can talk, but not sing) once approved by your doctor, 20-30 minutes of sustained activity or two 10 minute episodes of sustained activity every day.  -resistance training at least 2 days per week if approved by your doctor -balance exercises 3+ days per  week:   Stand somewhere where you have something sturdy to hold onto if you lose balance.    1) lift up on toes, start with 5x per day and work up to 20x   2) stand and lift on leg straight out to the side so that foot is a few inches of the floor, start with 5x each side and work up to 20x each side   3) stand on one foot, start with 5 seconds each side and work up to 20 seconds on each side  If you need ideas or help with getting more active:  -Silver sneakers https://tools.silversneakers.com  -Walk with a Doc: http://stephens-thompson.biz/  -try to include resistance (weight lifting/strength building) and balance exercises twice per week: or the following link for  ideas: ChessContest.fr  UpdateClothing.com.cy  STRESS MANAGEMENT: -can try meditating, or just sitting quietly with deep breathing while intentionally relaxing all parts of your body for 5 minutes daily -if you need further help with stress, anxiety or depression please follow up with your primary doctor or contact the wonderful folks at [facility] [place]: [place]: -options in [place] if you wish to engage in more social and exercise related activities:  -Silver sneakers https://tools.silversneakers.com  -Walk with a Doc: http://stephens-thompson.biz/  -Check out the [place] 50+ section on the [place] of [employer] (hiking clubs, book clubs, cards and games, chess, exercise classes, aquatic classes and much more) - see the website for details: https://www.-[place].gov/departments/parks-recreation/active-adults50  -YouTube has lots of exercise videos for different ages and abilities as well  -[place] (a variety of indoor and outdoor inperson activities for adults). [phone]. [address]  -Virtual Online Classes (a variety of topics): see seniorplanet.org or call [phone]  -consider volunteering at a school, hospice center, church, senior center or elsewhere           Lucretia Kern, DO

## 2022-12-06 NOTE — Patient Instructions (Addendum)
I really enjoyed getting to talk with you today! I am available on Tuesdays and Thursdays for virtual visits if you have any questions or concerns, or if I can be of any further assistance.   CHECKLIST FROM ANNUAL WELLNESS VISIT:  -Follow up (please call to schedule if not scheduled after visit):   -yearly for annual wellness visit with primary care office  Here is a list of your preventive care/health maintenance measures and the plan for each if any are due:  Health Maintenance  Topic Date Due   Zoster Vaccines- Shingrix (1 of 2) Please provide record/dates so that we can update in your chart.   COVID-19 Vaccine (5 - 2023-24 season) Due yearly   DTaP/Tdap/Td (3 - Tdap) 10/15/2023   Medicare Annual Wellness (AWV)  12/07/2023   Pneumonia Vaccine 73+ Years old  Completed   INFLUENZA VACCINE  Completed, due yearly   HPV VACCINES  Aged Out    -See a dentist at least yearly  -Get your eyes checked and then per your eye specialist's recommendations  -Other issues addressed today:  -I have included below further information regarding a healthy whole foods based diet, physical activity guidelines for adults, stress management and opportunities for social connections. I hope you find this information useful.     NUTRITION: -eat real food: lots of colorful vegetables (half the plate) and fruits -5-7 servings of vegetables and fruits per day (fresh or steamed is best), exp. 2 servings of vegetables with lunch and dinner and 2 servings of fruit per day. Berries and greens such as kale and collards are great choices.  -consume on a regular basis: whole grains (make sure first ingredient on label contains the word "whole"), fresh fruits, fish, nuts,  seeds, healthy oils (such as olive oil, avocado oil, grape seed oil) -may eat small amounts of dairy and lean meat on occasion, but avoid processed meats such as ham, bacon, lunch meat, etc. -drink water -try to avoid fast food and pre-packaged foods, processed meat -most experts advise limiting sodium to < '2300mg'$  per day, should limit further is any chronic conditions such as high blood pressure, heart disease, diabetes, etc. The American Heart Association advised that < '1500mg'$  is is ideal -try to avoid foods that contain any ingredients with names you do not recognize  -try to avoid sugar/sweets (except for the natural sugar that occurs in fresh fruit) -try to avoid sweet drinks -try to avoid white rice, white bread, pasta (unless whole grain), white or yellow potatoes  EXERCISE GUIDELINES FOR ADULTS: -if you wish to increase your physical activity, do so gradually and with the approval of your doctor -STOP and seek medical care immediately if you have any chest pain, chest discomfort or trouble breathing when starting or increasing exercise  -move and stretch your body, legs, feet and arms when sitting for long periods -Physical activity guidelines for optimal health in adults: -least 150 minutes per week of aerobic exercise (can talk, but not sing) once approved by your doctor, 20-30 minutes of sustained activity or two 10 minute episodes of sustained activity every day.  -resistance training at least 2 days per week if approved by your doctor -balance exercises 3+ days per week:   Stand somewhere where you have something sturdy to hold onto if you lose balance.    1) lift up on toes, start with 5x per day and work up to 20x   2) stand and lift on leg straight out to the side so  that foot is a few inches of the floor, start with 5x each side and work up to 20x each side   3) stand on one foot, start with 5 seconds each side and work up to 20 seconds on each side  If you need ideas or help  with getting more active:  -Silver sneakers https://tools.silversneakers.com  -Walk with a Doc: http://stephens-thompson.biz/  -try to include resistance (weight lifting/strength building) and balance exercises twice per week: or the following link for ideas: ChessContest.fr  UpdateClothing.com.cy  STRESS MANAGEMENT: -can try meditating, or just sitting quietly with deep breathing while intentionally relaxing all parts of your body for 5 minutes daily -if you need further help with stress, anxiety or depression please follow up with your primary doctor or contact the wonderful folks at [place]: [place]: -options in [place] if you wish to engage in more social and exercise related activities:  -Silver sneakers https://tools.silversneakers.com  -Walk with a Doc: http://stephens-thompson.biz/  -Check out the [place] 50+ section on the [place] [employer] (hiking clubs, book clubs, cards and games, chess, exercise classes, aquatic classes and much more) - see the website for details: https://www.[place].gov/departments/parks-recreation/active-adults50  -YouTube has lots of exercise videos for different ages and abilities as well  -[place] (a variety of indoor and outdoor inperson activities for adults). [phone]. [address]  -Virtual Online Classes (a variety of topics): see seniorplanet.org or call [phone]  -consider volunteering at a school, hospice center, church, senior center or elsewhere

## 2022-12-21 ENCOUNTER — Other Ambulatory Visit (HOSPITAL_COMMUNITY): Payer: Self-pay | Admitting: *Deleted

## 2022-12-21 DIAGNOSIS — F419 Anxiety disorder, unspecified: Secondary | ICD-10-CM

## 2022-12-21 DIAGNOSIS — F332 Major depressive disorder, recurrent severe without psychotic features: Secondary | ICD-10-CM

## 2022-12-21 MED ORDER — LAMOTRIGINE 25 MG PO TABS: 50.0000 mg | ORAL_TABLET | Freq: Every day | ORAL | 0 refills | Status: DC

## 2022-12-23 ENCOUNTER — Encounter (HOSPITAL_COMMUNITY): Payer: Self-pay | Admitting: Psychiatry

## 2022-12-23 ENCOUNTER — Telehealth (HOSPITAL_BASED_OUTPATIENT_CLINIC_OR_DEPARTMENT_OTHER): Payer: Medicare Other | Admitting: Psychiatry

## 2022-12-23 DIAGNOSIS — F419 Anxiety disorder, unspecified: Secondary | ICD-10-CM

## 2022-12-23 DIAGNOSIS — F332 Major depressive disorder, recurrent severe without psychotic features: Secondary | ICD-10-CM | POA: Diagnosis not present

## 2022-12-23 MED ORDER — ESCITALOPRAM OXALATE 20 MG PO TABS: ORAL_TABLET | ORAL | 2 refills | Status: DC

## 2022-12-23 MED ORDER — LAMOTRIGINE 25 MG PO TABS: 50.0000 mg | ORAL_TABLET | Freq: Every day | ORAL | 2 refills | Status: DC

## 2022-12-23 NOTE — Progress Notes (Signed)
[facility] MD Virtual Progress Note   Patient Location: Home Provider Location: Home Office  I connect with patient by telephone and verified that I am speaking with correct person by using two identifiers. I discussed the limitations of evaluation and management by telemedicine and the availability of in person appointments. I also discussed with the patient that there may be a patient responsible charge related to this service. The patient expressed understanding and agreed to proceed.  KELCIE LOO [id number] 86 y.o.  12/23/2022 11:22 AM    History of Present Illness:  Patient is evaluated by phone session.  He is doing well on his current medication.  Since stopped taking Lamictal he has no major panic attack, feeling of hopelessness or crying spells.  He has not taken Klonopin since the last visit.  He sleeps good.  He started gym on a regular basis.  He also started reading poetry and usually every other week he had a session with his friend for 45 minutes about poetry.  He denies any feeling of hopelessness or worthlessness.  He has no more dizziness.  His appetite is same and his weight is unchanged from the past.  Recently had a visit with his cardiologist for a follow-up but no new medication added.  His energy level is good.  He wants to keep the Lexapro and Lamictal.  He does not need a new prescription of Klonopin.  His holidays were good as he was able to see his kids from out of state.  Past Psychiatric History: Reviewed. H/O depression and anxiety most of his life.  Had a good response with TMS and Lexapro. Tried Celexa, Pristiq, Remeron, Cymbalta and Xanax but limited outcome. Klonopin and Buspar made groggy.  No h/o suicidal attempt, mania, psychosis or self abusive behavior.  Saw Dr. Cheryln Manly in the past for therapy.      Outpatient Encounter Medications as of 12/23/2022  Medication Sig   acetaminophen (TYLENOL) 500 MG tablet Take 1,000 mg by mouth every 6  (six) hours as needed for moderate pain or headache.   amLODipine (NORVASC) 10 MG tablet TAKE 1 TABLET(10 MG) BY MOUTH DAILY   aspirin EC 81 MG tablet Take 1 tablet (81 mg total) by mouth daily. Swallow whole.   atorvastatin (LIPITOR) 10 MG tablet Take 1 tablet (10 mg total) by mouth daily.   clobetasol cream (TEMOVATE) AB-123456789 % Apply 1 application  topically 2 (two) times daily.   clonazePAM (KLONOPIN) 0.5 MG tablet Take one tablet (0.5 mg total) per day as needed for panic attacks/severe anxiety.   escitalopram (LEXAPRO) 20 MG tablet TAKE 1 AND 1/2 TABLETS BY MOUTH DAILY   finasteride (PROSCAR) 5 MG tablet Take 5 mg by mouth daily.   hydrocortisone 2.5 % cream Apply topically 2 (two) times daily as needed. (Patient taking differently: Apply 1 application  topically 2 (two) times daily as needed (rash).)   lamoTRIgine (LAMICTAL) 25 MG tablet Take 2 tablets (50 mg total) by mouth daily.   omeprazole (PRILOSEC) 40 MG capsule TAKE 1 CAPSULE BY MOUTH DAILY   valsartan (DIOVAN) 160 MG tablet TAKE 1 TABLET(160 MG) BY MOUTH DAILY   No facility-administered encounter medications on file as of 12/23/2022.    No results found for this or any previous visit (from the past 2160 hour(s)).   Psychiatric Specialty Exam: Physical Exam  Review of Systems  Weight 139 lb (63 kg).There is no height or weight on file to calculate BMI.  General Appearance: NA  Eye Contact:  NA  Speech:  Clear and Coherent and Normal Rate  Volume:  Normal  Mood:  Euthymic  Affect:  NA  Thought Process:  Goal Directed  Orientation:  Full (Time, Place, and Person)  Thought Content:  WDL  Suicidal Thoughts:  No  Homicidal Thoughts:  No  Memory:  Immediate;   Good Recent;   Good Remote;   Good  Judgement:  Intact  Insight:  Present  Psychomotor Activity:  NA  Concentration:  Concentration: Good and Attention Span: Good  Recall:  Good  Fund of Knowledge:  Good  Language:  Good  Akathisia:  No  Handed:  Right  AIMS  (if indicated):     Assets:  Communication Skills Desire for Improvement Housing Resilience Social Support Transportation  ADL's:  Intact  Cognition:  WNL  Sleep:  ok     Assessment/Plan: Anxiety - Plan: lamoTRIgine (LAMICTAL) 25 MG tablet  Major depressive disorder, recurrent, severe without psychotic features (HCC) - Plan: lamoTRIgine (LAMICTAL) 25 MG tablet, escitalopram (LEXAPRO) 20 MG tablet  Patient is stable on his current medication.  Reviewed blood work results and recent visit from other provider.  Continue Lamictal 50 mg daily, Lexapro 30 mg daily.  He does not need a new prescription of Klonopin since he has not used since the last visit.  Recommended to call us back if is any question or any concern.  Follow-up in 3 months.  Patient preferred local pharmacy for his refills.   Follow Up Instructions:     I discussed the assessment and treatment plan with the patient. The patient was provided an opportunity to ask questions and all were answered. The patient agreed with the plan and demonstrated an understanding of the instructions.   The patient was advised to call back or seek an in-person evaluation if the symptoms worsen or if the condition fails to improve as anticipated.    Collaboration of Care: Other provider involved in patient's care AEB notes are available in epic to review.  Patient/Guardian was advised Release of Information must be obtained prior to any record release in order to collaborate their care with an outside provider. Patient/Guardian was advised if they have not already done so to contact the registration department to sign all necessary forms in order for [name] to release information regarding their care.   Consent: Patient/Guardian gives verbal consent for treatment and assignment of benefits for services provided during this visit. Patient/Guardian expressed understanding and agreed to proceed.     I provided 18 minutes of non face to face time  during this encounter.  Kathlee Nations, MD 12/23/2022

## 2022-12-29 ENCOUNTER — Telehealth (HOSPITAL_COMMUNITY): Payer: Medicare Other | Admitting: Psychiatry

## 2023-01-03 ENCOUNTER — Telehealth (HOSPITAL_COMMUNITY): Payer: Self-pay | Admitting: *Deleted

## 2023-01-03 DIAGNOSIS — R194 Change in bowel habit: Secondary | ICD-10-CM | POA: Diagnosis not present

## 2023-01-03 DIAGNOSIS — Z86010 Personal history of colonic polyps: Secondary | ICD-10-CM | POA: Diagnosis not present

## 2023-01-03 DIAGNOSIS — K219 Gastro-esophageal reflux disease without esophagitis: Secondary | ICD-10-CM | POA: Diagnosis not present

## 2023-01-03 NOTE — Telephone Encounter (Signed)
It is unlikely.  His GI symptoms started recently and patient taking the Lamictal for at least 5 months.  However if he insists to stop the Lamictal to see GI symptoms stops he can try but if symptoms of GI do not stopped then he need to go back on Lamictal and see provider for further help.

## 2023-01-03 NOTE — Telephone Encounter (Signed)
Pt called with c/o diarrhea he thinks is r/t the Lamictal. Pt says she saw GI provider yesterday and they suggested d/c Lamictal. Pt requesting to stop this med "for awhile". Pt started on Lamictal on 08/30/23 but says GI symptoms really present for the last 4 weeks. Pt next appointment scheduled for 03/23/23. Please review and advise.

## 2023-01-11 ENCOUNTER — Other Ambulatory Visit: Payer: Self-pay | Admitting: Family Medicine

## 2023-01-18 ENCOUNTER — Encounter (HOSPITAL_COMMUNITY): Payer: Self-pay | Admitting: Psychiatry

## 2023-01-18 ENCOUNTER — Telehealth (HOSPITAL_BASED_OUTPATIENT_CLINIC_OR_DEPARTMENT_OTHER): Payer: Medicare Other | Admitting: Psychiatry

## 2023-01-18 VITALS — Wt 137.0 lb

## 2023-01-18 DIAGNOSIS — F419 Anxiety disorder, unspecified: Secondary | ICD-10-CM

## 2023-01-18 DIAGNOSIS — F332 Major depressive disorder, recurrent severe without psychotic features: Secondary | ICD-10-CM | POA: Diagnosis not present

## 2023-01-18 MED ORDER — CLONAZEPAM 0.5 MG PO TABS: ORAL_TABLET | ORAL | 0 refills | Status: DC

## 2023-01-18 NOTE — Progress Notes (Signed)
[facility] MD Virtual Progress Note   Patient Location: Home Provider Location: Home Office  I connect with patient by telephone and verified that I am speaking with correct person by using two identifiers. I discussed the limitations of evaluation and management by telemedicine and the availability of in person appointments. I also discussed with the patient that there may be a patient responsible charge related to this service. The patient expressed understanding and agreed to proceed.  [name] [id number] 86 y.o.  01/18/2023 3:51 PM  History of Present Illness:  Patient requested an earlier appointment because he is having GI issues.  He believes it is from the Lamictal.  He cut down the Lamictal and he noticed some improvement but is still there.  He saw a GI and he has workup which was negative.  He admitted some anxiety come back and took some Klonopin and that helped him.  He tried to keep himself busy with playing golf, watching TV and going to gym.  Denies any crying spells or any feeling of hopelessness or worthlessness.  Lost 2 pounds and he is concerned about that.  He also spent time with audio poetry which he usually enjoys a lot.  He is taking Lexapro and denies any side effects.  He also now if he cannot stop the Lamictal for a while to see if side effects go away.  He will also need a new prescription of Klonopin which was given few months ago.  Past Psychiatric History: H/O depression and anxiety most of his life.  Had a good response with TMS and Lexapro. Tried Celexa, Pristiq, Remeron, Cymbalta and Xanax but limited outcome. Klonopin and Buspar made groggy.  No h/o suicidal attempt, mania, psychosis or self abusive behavior.  Saw Dr. Cheryln Manly in the past for therapy.       Outpatient Encounter Medications as of 01/18/2023  Medication Sig   acetaminophen (TYLENOL) 500 MG tablet Take 1,000 mg by mouth every 6 (six) hours as needed for moderate pain or  headache.   amLODipine (NORVASC) 10 MG tablet TAKE 1 TABLET(10 MG) BY MOUTH DAILY   aspirin EC 81 MG tablet Take 1 tablet (81 mg total) by mouth daily. Swallow whole.   atorvastatin (LIPITOR) 10 MG tablet Take 1 tablet (10 mg total) by mouth daily.   clobetasol cream (TEMOVATE) [id number] % Apply 1 application  topically 2 (two) times daily.   clonazePAM (KLONOPIN) 0.5 MG tablet Take one tablet (0.5 mg total) per day as needed for panic attacks/severe anxiety.   escitalopram (LEXAPRO) 20 MG tablet TAKE 1 AND 1/2 TABLETS BY MOUTH DAILY   finasteride (PROSCAR) 5 MG tablet Take 5 mg by mouth daily.   hydrocortisone 2.5 % cream Apply topically 2 (two) times daily as needed. (Patient taking differently: Apply 1 application  topically 2 (two) times daily as needed (rash).)   lamoTRIgine (LAMICTAL) 25 MG tablet Take 2 tablets (50 mg total) by mouth daily.   omeprazole (PRILOSEC) 40 MG capsule TAKE 1 CAPSULE BY MOUTH DAILY   valsartan (DIOVAN) 160 MG tablet TAKE 1 TABLET(160 MG) BY MOUTH DAILY   No facility-administered encounter medications on file as of 01/18/2023.    No results found for this or any previous visit (from the past 2160 hour(s)).   Psychiatric Specialty Exam: Physical Exam  Review of Systems  Gastrointestinal:  Positive for diarrhea and nausea.    Weight 137 lb (62.1 kg).There is no height or weight on file to calculate BMI.  General Appearance: NA  Eye Contact:  NA  Speech:  Clear and Coherent  Volume:  Normal  Mood:  Anxious  Affect:  NA  Thought Process:  Goal Directed  Orientation:  Full (Time, Place, and Person)  Thought Content:  Rumination  Suicidal Thoughts:  No  Homicidal Thoughts:  No  Memory:  Immediate;   Good Recent;   Good Remote;   Good  Judgement:  Intact  Insight:  Present  Psychomotor Activity:  NA  Concentration:  Concentration: Good and Attention Span: Good  Recall:  Good  Fund of Knowledge:  Good  Language:  Good  Akathisia:  No  Handed:  Right   AIMS (if indicated):     Assets:  Communication Skills Desire for Improvement Housing Resilience Social Support  ADL's:  Intact  Cognition:  WNL  Sleep:  ok     Assessment/Plan: Anxiety  Major depressive disorder, recurrent, severe without psychotic features ([place])  Discussed current medication.  Usually Lamictal does not cause GI side effects but again it is possible and I recommend to discontinue for now until symptoms resolved.  However if symptoms do not resolve then he may need to restart Lamictal as it helped him a lot.  Patient will need a new prescription of Klonopin 0.5 mg to take as needed for severe anxiety.  Recommend to call us back if is any question or any concern.  Recommend to keep the appointment in May.   Follow Up Instructions:     I discussed the assessment and treatment plan with the patient. The patient was provided an opportunity to ask questions and all were answered. The patient agreed with the plan and demonstrated an understanding of the instructions.   The patient was advised to call back or seek an in-person evaluation if the symptoms worsen or if the condition fails to improve as anticipated.    Collaboration of Care: Other provider involved in patient's care AEB notes are available in epic to review.  Patient/Guardian was advised Release of Information must be obtained prior to any record release in order to collaborate their care with an outside provider. Patient/Guardian was advised if they have not already done so to contact the registration department to sign all necessary forms in order for [name] to release information regarding their care.   Consent: Patient/Guardian gives verbal consent for treatment and assignment of benefits for services provided during this visit. Patient/Guardian expressed understanding and agreed to proceed.     I provided 14 minutes of non face to face time during this encounter.  Kathlee Nations, MD 01/18/2023

## 2023-01-21 ENCOUNTER — Other Ambulatory Visit: Payer: Self-pay | Admitting: Family Medicine

## 2023-01-26 DIAGNOSIS — R194 Change in bowel habit: Secondary | ICD-10-CM | POA: Diagnosis not present

## 2023-02-14 ENCOUNTER — Other Ambulatory Visit: Payer: Self-pay

## 2023-02-14 DIAGNOSIS — E7800 Pure hypercholesterolemia, unspecified: Secondary | ICD-10-CM

## 2023-02-14 MED ORDER — ATORVASTATIN CALCIUM 10 MG PO TABS: 10.0000 mg | ORAL_TABLET | Freq: Every day | ORAL | 3 refills | Status: DC

## 2023-03-22 ENCOUNTER — Telehealth (HOSPITAL_COMMUNITY): Payer: Self-pay | Admitting: *Deleted

## 2023-03-22 DIAGNOSIS — F332 Major depressive disorder, recurrent severe without psychotic features: Secondary | ICD-10-CM

## 2023-03-22 MED ORDER — ESCITALOPRAM OXALATE 20 MG PO TABS: ORAL_TABLET | ORAL | 0 refills | Status: DC

## 2023-03-22 NOTE — Addendum Note (Signed)
Addended by: Kathryne Sharper T on: 03/22/2023 03:47 PM   Modules accepted: Orders

## 2023-03-22 NOTE — Telephone Encounter (Signed)
Done

## 2023-03-22 NOTE — Telephone Encounter (Signed)
Patient called [facility] requesting refill  TAKE 1 AND 1/2 TABLETS BY MOUTH DAILY, Normal Dx Associated: Start Date & Time: 12/23/2022 Ord/Sold: 12/23/2022 (O)Ordered On: 12/23/2022 ReportPharmacy: [id number] - [place], [place] - [address]    Note to Pharmacy: ZERO refills remain on this prescription. Your patient is requesting advance approval of refills for this medication to PREVENT ANY MISSED DOSES Admin Instructions: TAKE 1 AND 1/2 TABLETS BY MOUTH DAILY

## 2023-03-23 ENCOUNTER — Telehealth (HOSPITAL_BASED_OUTPATIENT_CLINIC_OR_DEPARTMENT_OTHER): Payer: Medicare Other | Admitting: Psychiatry

## 2023-03-23 ENCOUNTER — Encounter (HOSPITAL_COMMUNITY): Payer: Self-pay | Admitting: Psychiatry

## 2023-03-23 ENCOUNTER — Telehealth (HOSPITAL_COMMUNITY): Payer: Medicare Other | Admitting: Psychiatry

## 2023-03-23 VITALS — Wt 139.0 lb

## 2023-03-23 DIAGNOSIS — F419 Anxiety disorder, unspecified: Secondary | ICD-10-CM | POA: Diagnosis not present

## 2023-03-23 DIAGNOSIS — F332 Major depressive disorder, recurrent severe without psychotic features: Secondary | ICD-10-CM

## 2023-03-23 MED ORDER — ESCITALOPRAM OXALATE 20 MG PO TABS: ORAL_TABLET | ORAL | 0 refills | Status: DC

## 2023-03-23 NOTE — Progress Notes (Signed)
[facility] MD Virtual Progress Note   Patient Location: Home Provider Location: Office  I connect with patient by telephone and verified that I am speaking with correct person by using two identifiers. I discussed the limitations of evaluation and management by telemedicine and the availability of in person appointments. I also discussed with the patient that there may be a patient responsible charge related to this service. The patient expressed understanding and agreed to proceed.  [name] [id number] 87 y.o.  03/23/2023 8:33 AM  History of Present Illness:  Patient is evaluated by phone session.  He is doing very well and he had no GI symptoms since he stopped the Lamictal.  He has been sleeping good.  He has not taken the Klonopin since the last visit.  He denies any panic attack, crying spells or any feeling of hopelessness or worthlessness.  His appetite is okay.  He feels his depression is a stable on Lexapro and he has no tremors, shakes or any EPS.  He sleeps good.  He is going to [place] to visit his son and grandkids before grandkids go to vacation.  He like to keep the Lexapro and does not need a new prescription of Klonopin at this time.  He does enjoy playing golf, watching TV and try to keep himself busy.  Past Psychiatric History: H/O depression and anxiety most of his life.  Had a good response with TMS and Lexapro. Tried Celexa, Pristiq, Remeron, Cymbalta and Xanax but limited outcome. Klonopin and Buspar made groggy.  Lamictal causes GI side effects no h/o suicidal attempt, mania, psychosis or self abusive behavior.  Saw Dr. Dellia Cloud in the past for therapy.    Outpatient Encounter Medications as of 03/23/2023  Medication Sig   acetaminophen (TYLENOL) 500 MG tablet Take 1,000 mg by mouth every 6 (six) hours as needed for moderate pain or headache.   amLODipine (NORVASC) 10 MG tablet TAKE 1 TABLET(10 MG) BY MOUTH DAILY   aspirin EC 81 MG tablet Take 1  tablet (81 mg total) by mouth daily. Swallow whole.   atorvastatin (LIPITOR) 10 MG tablet Take 1 tablet (10 mg total) by mouth daily.   clobetasol cream (TEMOVATE) 0.05 % Apply 1 application  topically 2 (two) times daily.   clonazePAM (KLONOPIN) 0.5 MG tablet Take one tablet (0.5 mg total) per day as needed for panic attacks/severe anxiety.   escitalopram (LEXAPRO) 20 MG tablet TAKE 1 AND 1/2 TABLETS BY MOUTH DAILY   finasteride (PROSCAR) 5 MG tablet Take 5 mg by mouth daily.   hydrocortisone 2.5 % cream Apply topically 2 (two) times daily as needed. (Patient taking differently: Apply 1 application  topically 2 (two) times daily as needed (rash).)   lamoTRIgine (LAMICTAL) 25 MG tablet Take 2 tablets (50 mg total) by mouth daily.   omeprazole (PRILOSEC) 40 MG capsule TAKE 1 CAPSULE BY MOUTH DAILY   valsartan (DIOVAN) 160 MG tablet TAKE 1 TABLET(160 MG) BY MOUTH DAILY   No facility-administered encounter medications on file as of 03/23/2023.    No results found for this or any previous visit (from the past 2160 hour(s)).   Psychiatric Specialty Exam: Physical Exam  Review of Systems  Weight 139 lb (63 kg).There is no height or weight on file to calculate BMI.  General Appearance: NA  Eye Contact:  NA  Speech:  Clear and Coherent and Normal Rate  Volume:  Normal  Mood:  Euthymic  Affect:  Appropriate  Thought Process:  Goal  Directed  Orientation:  Full (Time, Place, and Person)  Thought Content:  WDL  Suicidal Thoughts:  No  Homicidal Thoughts:  No  Memory:  Immediate;   Good Recent;   Good Remote;   Good  Judgement:  Good  Insight:  Good  Psychomotor Activity:  Normal  Concentration:  Concentration: Good and Attention Span: Good  Recall:  Good  Fund of Knowledge:  Good  Language:  Good  Akathisia:  No  Handed:  Right  AIMS (if indicated):     Assets:  Communication Skills Desire for Improvement Financial Resources/Insurance Housing Resilience Social  Support Transportation  ADL's:  Intact  Cognition:  WNL  Sleep:  ok     Assessment/Plan: Major depressive disorder, recurrent, severe without psychotic features (HCC) - Plan: escitalopram (LEXAPRO) 20 MG tablet  Anxiety - Plan: escitalopram (LEXAPRO) 20 MG tablet  Patient is stable on his current medication.  He has not taken Klonopin since the last visit.  He stopped the Lamictal because of GI side effects and he has no more issues with his stomach.  Discussed medication side effects and benefits.  He does not need a new prescription of Klonopin.  Continue Lexapro 30 mg daily.  Recommend to call us back if is any question or any concern.  Follow-up in 3 months.   Follow Up Instructions:     I discussed the assessment and treatment plan with the patient. The patient was provided an opportunity to ask questions and all were answered. The patient agreed with the plan and demonstrated an understanding of the instructions.   The patient was advised to call back or seek an in-person evaluation if the symptoms worsen or if the condition fails to improve as anticipated.    Collaboration of Care: Other provider involved in patient's care AEB notes are available in epic to review.  Patient/Guardian was advised Release of Information must be obtained prior to any record release in order to collaborate their care with an outside provider. Patient/Guardian was advised if they have not already done so to contact the registration department to sign all necessary forms in order for [name] to release information regarding their care.   Consent: Patient/Guardian gives verbal consent for treatment and assignment of benefits for services provided during this visit. Patient/Guardian expressed understanding and agreed to proceed.     I provided 16 minutes of non face to face time during this encounter.  Note: This document was prepared by [employer] voice dictation technology and any errors that results from  this process are unintentional.    Cleotis Nipper, MD 03/23/2023

## 2023-03-26 ENCOUNTER — Other Ambulatory Visit: Payer: Self-pay | Admitting: Family Medicine

## 2023-04-19 ENCOUNTER — Other Ambulatory Visit: Payer: Self-pay | Admitting: Family Medicine

## 2023-04-26 ENCOUNTER — Other Ambulatory Visit (HOSPITAL_COMMUNITY): Payer: Self-pay

## 2023-04-26 DIAGNOSIS — F332 Major depressive disorder, recurrent severe without psychotic features: Secondary | ICD-10-CM

## 2023-04-26 DIAGNOSIS — F419 Anxiety disorder, unspecified: Secondary | ICD-10-CM

## 2023-04-26 MED ORDER — CLONAZEPAM 0.5 MG PO TABS: ORAL_TABLET | ORAL | 0 refills | Status: DC

## 2023-05-09 ENCOUNTER — Telehealth (HOSPITAL_COMMUNITY): Payer: Self-pay | Admitting: *Deleted

## 2023-05-09 DIAGNOSIS — F419 Anxiety disorder, unspecified: Secondary | ICD-10-CM

## 2023-05-09 NOTE — Telephone Encounter (Signed)
Pt called requesting to change Klonopin 0.5 mg back to the Ativan 0.5 mg as he says the Klonopin is giving him "black stools" and therefore cannot get GI testing to find out cause while on Klonopin. Medication was changed from Ativan 0.5 mg to Klonopin 0.5 mg on 04/26/23. Last appointment on 03/23/23 and f/u scheduled for 06/19/23. Please review and advise.

## 2023-05-10 ENCOUNTER — Other Ambulatory Visit (HOSPITAL_COMMUNITY): Payer: Self-pay | Admitting: *Deleted

## 2023-05-10 MED ORDER — LORAZEPAM 0.5 MG PO TABS: ORAL_TABLET | ORAL | 0 refills | Status: DC

## 2023-05-10 NOTE — Telephone Encounter (Signed)
Pt advised.

## 2023-05-10 NOTE — Telephone Encounter (Signed)
Pharmacist requiring e-scribe of the Ativan due to pt change in this medication. Clinical research associate called pt pharmacy [facility] [address] [place] [place]. [phone].

## 2023-05-10 NOTE — Telephone Encounter (Signed)
Done. Please inform patient with instruction.

## 2023-05-10 NOTE — Telephone Encounter (Signed)
We can switch him back to Ativan 0.5 mg however if his concern did not resolve and his anxiety gets worse and he can go back to Klonopin.  Other option is that after the GI test he can go back to Klonopin.  Please call lorazepam 0.5 mg #20 to take as needed for severe panic attack.

## 2023-05-11 ENCOUNTER — Ambulatory Visit (HOSPITAL_BASED_OUTPATIENT_CLINIC_OR_DEPARTMENT_OTHER): Payer: Medicare Other | Admitting: Psychiatry

## 2023-05-11 ENCOUNTER — Encounter (HOSPITAL_COMMUNITY): Payer: Self-pay | Admitting: Psychiatry

## 2023-05-11 VITALS — Wt 135.0 lb

## 2023-05-11 DIAGNOSIS — F419 Anxiety disorder, unspecified: Secondary | ICD-10-CM

## 2023-05-11 DIAGNOSIS — F332 Major depressive disorder, recurrent severe without psychotic features: Secondary | ICD-10-CM | POA: Diagnosis not present

## 2023-05-11 MED ORDER — BREXPIPRAZOLE 0.5 MG PO TABS: 0.5000 mg | ORAL_TABLET | Freq: Every day | ORAL | 0 refills | Status: DC

## 2023-05-11 NOTE — Progress Notes (Signed)
BH MD/PA/NP OP Progress Note  05/11/2023 2:31 PM [name]  MRN:  [id number]  Chief Complaint:  Chief Complaint  Patient presents with   Anxiety   Depression   Follow-up   HPI: Patient came today with his wife for an earlier appointment.  He reported that he is not doing very well.  He is sad, depressed and does not want to do anything.  He had not played golf in the past 3 weeks.  He also not writing any points.  He is not sure what triggered his depression.  His wife reported there are times when he had some moods up and down.  He was taking Klonopin to help his anxiety but 1 day he noticed he had a dark stool and he was not sure if it is caused by Klonopin and he stopped.  He wanted to go back to Ativan which we restarted.  He was taking Lamictal but it was discontinued due to GI issues.  He is taking Lexapro 30 mg.  His GI issue is chronic and he is in touch with his GI.  He requested to talk to his daughter-in-law who lives in [place].  Patient told his son and daughter-in-law both are psychiatrist.  I spoke to her daughter-in-law Dr. Archie Endo and discussed about further treatment plan.  Patient had genetic testing in the past and most of antidepressant are in moderate column.  Trintellix is in favorable calm.  None of mood stabilizer have any significant interaction.  We discussed the possibility of underlying bipolar disorder type II.  His daughter-in-law noticed that there are moments when patient appears to be too happy very excited and very motivated but then crash into depression.  Patient's wife also echo the same thing.  Patient does feel hopelessness but denies any suicidal thoughts, crying spells, agitation, aggression, violence or any paranoia.  Lost a few pounds because he feels not active and not eating regularly.  He denies drinking or using any illegal substances.  Though he had a better response with TMS but he is not interested as he noticed second time he did not see any  improvement.  He is willing to try low-dose REXULTI.Marland Kitchen  His daughter-in-law also suggested to try REXULTI low-dose to help the mood symptoms.  Visit Diagnosis:    ICD-10-CM   1. Major depressive disorder, recurrent, severe without psychotic features (HCC)  F33.2 Brexpiprazole (REXULTI) 0.5 MG TABS    2. Anxiety  F41.9 Brexpiprazole (REXULTI) 0.5 MG TABS      Past Psychiatric History: Reviewed. H/O depression and anxiety most of his life.  Had a good response with TMS and Lexapro. Tried Celexa, Pristiq, Remeron, Cymbalta and Xanax but limited outcome. Klonopin and Buspar made groggy.  No h/o suicidal attempt, mania, psychosis or self abusive behavior.  Saw Dr. Dellia Cloud in the past for therapy.      Past Medical History:  Past Medical History:  Diagnosis Date   Anxiety    BPH (benign prostatic hypertrophy)    DDD (degenerative disc disease), lumbar 2002   Depression    Difficult intubation 02/22/2019   Anterior with limited oral opening   Essential hypertension 03/10/2019   GERD (gastroesophageal reflux disease)    History of colon polyps    History of hiatal hernia 2012   Hyperlipemia    Mild   Indwelling Foley catheter present    Iron deficiency anemia    IV iron therapy    Mild hyperlipidemia 02/20/2008   Qualifier: Diagnosis  of  By: Cato Mulligan MD, Bruce     Pre-diabetes    Renal cyst 2008   7.6 cm lower pole right renal cyst   Sleep apnea    slight, no CPAP used    Past Surgical History:  Procedure Laterality Date   COLONOSCOPY  08/2011   NASAL SEPTOPLASTY W/ TURBINOPLASTY  08/03/2012   Procedure: NASAL SEPTOPLASTY WITH TURBINATE REDUCTION;  Surgeon: Serena Colonel, MD;  Location: [facility] OR;  Service: ENT;  Laterality: Bilateral;   UPPER GASTROINTESTINAL ENDOSCOPY  08/2011   XI ROBOTIC ASSISTED SIMPLE PROSTATECTOMY N/A 02/22/2019   Procedure: XI ROBOTIC ASSISTED SIMPLE PROSTATECTOMY;  Surgeon: Sebastian Ache, MD;  Location: [facility] ORS;  Service: Urology;  Laterality: N/A;    Family  Psychiatric History: Reviewed.  Family History:  Family History  Problem Relation Age of Onset   Heart attack Father    Hypertension Father    Hypertension Daughter    Hypertension Brother    Hypertension Brother    Hypertension Sister     Social History:  Social History   Socioeconomic History   Marital status: Married    Spouse name: Not on file   Number of children: 2   Years of education: Not on file   Highest education level: Not on file  Occupational History   Not on file  Tobacco Use   Smoking status: Former    Current packs/day: 0.00    Average packs/day: 0.5 packs/day for 10.0 years (5.0 ttl pk-yrs)    Types: Cigarettes    Start date: 79    Quit date: 1969    Years since quitting: 55.5   Smokeless tobacco: Never   Tobacco comments:    rare use over 50 years   Vaping Use   Vaping status: Never Used  Substance and Sexual Activity   Alcohol use: Yes    Alcohol/week: 1.0 standard drink of alcohol    Types: 1 Shots of liquor per week    Comment: weekly   Drug use: No   Sexual activity: Never    Birth control/protection: None  Other Topics Concern   Not on file  Social History Narrative   Lives in 2 level    Will stay for now    Just moved here approx 1.5 yo   Very nice neighbors    [employer]  x 5 with a very busy holiday   One son is a Therapist, sports and nephew is a Counsellor    Son in [place] moved to [place] and he misses his grand children    As 2 brothers in [place]       Retired from [employer], [employer]       Social Determinants of Health   Financial Resource Strain: Low Risk  (11/23/2021)   Overall Financial Resource Strain (CARDIA)    Difficulty of Paying Living Expenses: Not hard at all  Food Insecurity: No Food Insecurity (11/23/2021)   Hunger Vital Sign    Worried About Running Out of Food in the Last Year: Never true    Ran Out of Food in the Last Year: Never true  Transportation Needs: No Transportation Needs (11/23/2021)   PRAPARE -  Administrator, Civil Service (Medical): No    Lack of Transportation (Non-Medical): No  Physical Activity: Sufficiently Active (11/23/2021)   Exercise Vital Sign    Days of Exercise per Week: 4 days    Minutes of Exercise per Session: 40 min  Stress: No Stress Concern Present (11/23/2021)  [employer] of Occupational Health - Occupational Stress Questionnaire    Feeling of Stress : Only a little  Social Connections: Moderately Isolated (11/23/2021)   Social Connection and Isolation Panel [NHANES]    Frequency of Communication with Friends and Family: More than three times a week    Frequency of Social Gatherings with Friends and Family: More than three times a week    Attends Religious Services: Never    Database administrator or Organizations: No    Attends Banker Meetings: Never    Marital Status: Married    Allergies:  Allergies  Allergen Reactions   Dexlansoprazole     Other reaction(s): Unknown    Metabolic Disorder Labs: Lab Results  Component Value Date   HGBA1C 6.1 06/03/2022   MPG 116.89 02/20/2019   No results found for: "PROLACTIN" Lab Results  Component Value Date   CHOL 130 06/03/2022   TRIG 48.0 06/03/2022   HDL 57.60 06/03/2022   CHOLHDL 2 06/03/2022   VLDL 9.6 06/03/2022   LDLCALC 63 06/03/2022   LDLCALC 112 (H) 02/17/2021   Lab Results  Component Value Date   TSH 3.02 02/17/2021   TSH 3.28 12/12/2019    Therapeutic Level Labs: No results found for: "LITHIUM" No results found for: "VALPROATE" No results found for: "CBMZ"  Current Medications: Current Outpatient Medications  Medication Sig Dispense Refill   acetaminophen (TYLENOL) 500 MG tablet Take 1,000 mg by mouth every 6 (six) hours as needed for moderate pain or headache.     amLODipine (NORVASC) 10 MG tablet TAKE 1 TABLET(10 MG) BY MOUTH DAILY 90 tablet 0   aspirin EC 81 MG tablet Take 1 tablet (81 mg total) by mouth daily. Swallow whole. 90 tablet 3    atorvastatin (LIPITOR) 10 MG tablet Take 1 tablet (10 mg total) by mouth daily. 90 tablet 3   clobetasol cream (TEMOVATE) 0.05 % Apply 1 application  topically 2 (two) times daily.     escitalopram (LEXAPRO) 20 MG tablet TAKE 1 AND 1/2 TABLETS BY MOUTH DAILY 135 tablet 0   finasteride (PROSCAR) 5 MG tablet Take 5 mg by mouth daily.     hydrocortisone 2.5 % cream Apply topically 2 (two) times daily as needed. (Patient taking differently: Apply 1 application  topically 2 (two) times daily as needed (rash).) 30 g 1   lamoTRIgine (LAMICTAL) 25 MG tablet Take 2 tablets (50 mg total) by mouth daily. 60 tablet 2   LORazepam (ATIVAN) 0.5 MG tablet Take 1 tablet daily as needed for severe panic attacks. 20 tablet 0   omeprazole (PRILOSEC) 40 MG capsule TAKE 1 CAPSULE BY MOUTH DAILY 100 capsule 0   valsartan (DIOVAN) 160 MG tablet TAKE 1 TABLET(160 MG) BY MOUTH DAILY 90 tablet 0   No current facility-administered medications for this visit.     Musculoskeletal: Strength & Muscle Tone: within normal limits Gait & Station: normal Patient leans: N/A  Psychiatric Specialty Exam: Review of Systems  Weight 135 lb (61.2 kg).There is no height or weight on file to calculate BMI.  General Appearance: Casual  Eye Contact:  Fair  Speech:  Slow  Volume:  Decreased  Mood:  Dysphoric  Affect:  Congruent  Thought Process:  Descriptions of Associations: Intact  Orientation:  Full (Time, Place, and Person)  Thought Content: Rumination   Suicidal Thoughts:  No  Homicidal Thoughts:  No  Memory:  Immediate;   Good Recent;   Good Remote;   Good  Judgement:  Intact  Insight:  Present  Psychomotor Activity:  Decreased  Concentration:  Concentration: Fair and Attention Span: Fair  Recall:  Good  Fund of Knowledge: Good  Language: Good  Akathisia:  No  Handed:  Right  AIMS (if indicated): not done  Assets:  Communication Skills Desire for Improvement Financial Resources/Insurance Housing Social  Support Transportation  ADL's:  Intact  Cognition: WNL  Sleep:   ok   Screenings: AUDIT    Flowsheet Row Clinical Support from 12/14/2020 in [facility] [place]  Alcohol Use Disorder Identification Test Final Score (AUDIT) 3      GAD-7    Flowsheet Row Office Visit from 05/17/2022 in [facility] Clinical Support from 05/21/2021 in [facility] Clinical Support from 04/27/2021 in [facility]  Total GAD-7 Score 12 1 2       PHQ2-9    Flowsheet Row Video Visit from 12/06/2022 in [facility] [place] Clinical Support from 07/08/2022 in [facility] Clinical Support from 07/01/2022 in [facility] Clinical Support from 06/24/2022 in [facility] Clinical Support from 06/17/2022 in [facility]  PHQ-2 Total Score 2 0 0 0 2  PHQ-9 Total Score 7 1 2 2 10       Flowsheet Row Video Visit from 08/29/2022 in [facility] Video Visit from 11/30/2021 in [facility] Video Visit from 09/08/2021 in [facility]  C-SSRS RISK CATEGORY Error: Question 6 not populated No Risk Error: Q3, 4, or 5 should not be populated when Q2 is No        Assessment and Plan: I reviewed previous medication and spoke to his sister-in-law who is also a psychiatrist.  We discussed underlying mood disorder.  Patient has genetic testing and he will bring the copy of the results are faxed to [name] when he get back home.  We discussed trying a low-dose REXULTI and patient agreed with the plan.  We will try REXULTI 0.5 mg for 2 weeks and then 1 mg however I explained psychotropic medicine has side effects and if he notices tremors, shakes, dizziness then he may need to stop the medicine and let us know.  We  also talk about other options like Trintellix however we will consider it once we have genetic testing results available.  Patient is also going to talk to his GI about dark stool and getting stool test.  Discuss medication side effects and benefits.  Recommend to call us back if is any question or any concern.  He will continue lorazepam to take as needed for anxiety and Lexapro 30 mg daily.  We will follow-up in 3 to 4 weeks.  Collaboration of Care: Collaboration of Care: Other provider involved in patient's care AEB notes are available in epic to review.  Patient/Guardian was advised Release of Information must be obtained prior to any record release in order to collaborate their care with an outside provider. Patient/Guardian was advised if they have not already done so to contact the registration department to sign all necessary forms in order for [name] to release information regarding their care.   Consent: Patient/Guardian gives verbal consent for treatment and assignment of benefits for services provided during this visit. Patient/Guardian expressed understanding and agreed to proceed.    Cleotis Nipper, MD 05/11/2023, 2:31 PM

## 2023-05-15 ENCOUNTER — Ambulatory Visit: Payer: Medicare Other

## 2023-05-15 DIAGNOSIS — I6523 Occlusion and stenosis of bilateral carotid arteries: Secondary | ICD-10-CM | POA: Diagnosis not present

## 2023-05-16 DIAGNOSIS — K219 Gastro-esophageal reflux disease without esophagitis: Secondary | ICD-10-CM | POA: Diagnosis not present

## 2023-05-16 DIAGNOSIS — Z1211 Encounter for screening for malignant neoplasm of colon: Secondary | ICD-10-CM | POA: Diagnosis not present

## 2023-05-16 DIAGNOSIS — Z86010 Personal history of colonic polyps: Secondary | ICD-10-CM | POA: Diagnosis not present

## 2023-05-16 DIAGNOSIS — R195 Other fecal abnormalities: Secondary | ICD-10-CM | POA: Diagnosis not present

## 2023-05-23 ENCOUNTER — Telehealth (HOSPITAL_COMMUNITY): Payer: Self-pay

## 2023-05-23 NOTE — Progress Notes (Unsigned)
1. Bilateral carotid artery stenosis  Carotid artery duplex 05/15/2023: Duplex suggests stenosis in the right internal carotid artery (50-69%). <50%  stenosis in the right external carotid artery. Duplex suggests stenosis in the left internal carotid artery (minimal). Antegrade right vertebral artery flow. Antegrade left vertebral artery flow. Compared to the study done on 11/04/2022, no significant change. Follow up in six months is appropriate if clinically indicated.  - PCV CAROTID DUPLEX (BILATERAL) - PCV CAROTID DUPLEX (BILATERAL); Future

## 2023-05-23 NOTE — Telephone Encounter (Signed)
Patient is calling to inform you that he checked with insurance and Rexulti is covered but the copay is over 600 a month. The Abilify is covered if sent in as generic and is much more affordable. He would like to start on the Abilify.

## 2023-05-24 MED ORDER — ARIPIPRAZOLE 2 MG PO TABS: 2.0000 mg | ORAL_TABLET | Freq: Every day | ORAL | 0 refills | Status: DC

## 2023-05-24 NOTE — Telephone Encounter (Signed)
Abilify 2 mg send. Need to stop Rexulti.

## 2023-05-30 NOTE — Progress Notes (Signed)
Please let patient know that the carotid duplex is stable and will recheck in 6 months prior to his next OV. Please make sure it is arranged

## 2023-06-01 ENCOUNTER — Other Ambulatory Visit: Payer: Self-pay | Admitting: Gastroenterology

## 2023-06-01 NOTE — Progress Notes (Signed)
Called and spoke with patient regarding his CAD results. Appts are already scheduled.

## 2023-06-05 ENCOUNTER — Encounter (HOSPITAL_COMMUNITY): Payer: Self-pay | Admitting: Gastroenterology

## 2023-06-07 ENCOUNTER — Other Ambulatory Visit: Payer: Self-pay | Admitting: Family Medicine

## 2023-06-09 ENCOUNTER — Ambulatory Visit (HOSPITAL_COMMUNITY): Payer: Medicare Other | Admitting: Registered Nurse

## 2023-06-09 ENCOUNTER — Encounter (HOSPITAL_COMMUNITY)
Admission: RE | Disposition: A | Discharge status: Home / Self Care | Payer: Self-pay | Source: Home / Self Care | Attending: Gastroenterology

## 2023-06-09 ENCOUNTER — Ambulatory Visit (HOSPITAL_COMMUNITY)
Admission: RE | Admit: 2023-06-09 | Discharge: 2023-06-09 | Disposition: A | Discharge status: Home / Self Care | Payer: Medicare Other | Source: Home / Self Care | Attending: Gastroenterology | Admitting: Gastroenterology

## 2023-06-09 ENCOUNTER — Encounter (HOSPITAL_COMMUNITY): Payer: Self-pay | Admitting: Gastroenterology

## 2023-06-09 ENCOUNTER — Ambulatory Visit (HOSPITAL_BASED_OUTPATIENT_CLINIC_OR_DEPARTMENT_OTHER): Payer: Medicare Other | Admitting: Registered Nurse

## 2023-06-09 DIAGNOSIS — Z79899 Other long term (current) drug therapy: Secondary | ICD-10-CM | POA: Insufficient documentation

## 2023-06-09 DIAGNOSIS — K921 Melena: Secondary | ICD-10-CM | POA: Insufficient documentation

## 2023-06-09 DIAGNOSIS — K449 Diaphragmatic hernia without obstruction or gangrene: Secondary | ICD-10-CM | POA: Insufficient documentation

## 2023-06-09 DIAGNOSIS — K219 Gastro-esophageal reflux disease without esophagitis: Secondary | ICD-10-CM | POA: Diagnosis not present

## 2023-06-09 DIAGNOSIS — G4730 Sleep apnea, unspecified: Secondary | ICD-10-CM | POA: Insufficient documentation

## 2023-06-09 DIAGNOSIS — D509 Iron deficiency anemia, unspecified: Secondary | ICD-10-CM | POA: Diagnosis not present

## 2023-06-09 DIAGNOSIS — I10 Essential (primary) hypertension: Secondary | ICD-10-CM | POA: Diagnosis not present

## 2023-06-09 DIAGNOSIS — Q438 Other specified congenital malformations of intestine: Secondary | ICD-10-CM | POA: Insufficient documentation

## 2023-06-09 DIAGNOSIS — K635 Polyp of colon: Secondary | ICD-10-CM

## 2023-06-09 DIAGNOSIS — K5730 Diverticulosis of large intestine without perforation or abscess without bleeding: Secondary | ICD-10-CM | POA: Insufficient documentation

## 2023-06-09 DIAGNOSIS — Z87891 Personal history of nicotine dependence: Secondary | ICD-10-CM | POA: Diagnosis not present

## 2023-06-09 HISTORY — PX: POLYPECTOMY: SHX5525

## 2023-06-09 HISTORY — PX: ESOPHAGOGASTRODUODENOSCOPY (EGD) WITH PROPOFOL: SHX5813

## 2023-06-09 HISTORY — PX: COLONOSCOPY WITH PROPOFOL: SHX5780

## 2023-06-09 SURGERY — ESOPHAGOGASTRODUODENOSCOPY (EGD) WITH PROPOFOL
Anesthesia: Monitor Anesthesia Care

## 2023-06-09 MED ORDER — PROPOFOL 500 MG/50ML IV EMUL
INTRAVENOUS | Status: DC | PRN
  Administered 2023-06-09: 120 ug/kg/min via INTRAVENOUS

## 2023-06-09 MED ORDER — PROPOFOL 10 MG/ML IV BOLUS
INTRAVENOUS | Status: DC | PRN
  Administered 2023-06-09 (×2): 20 mg via INTRAVENOUS

## 2023-06-09 MED ORDER — SODIUM CHLORIDE 0.9 % IV SOLN: INTRAVENOUS | Status: DC

## 2023-06-09 MED ORDER — PROPOFOL 1000 MG/100ML IV EMUL
INTRAVENOUS | Status: AC
  Filled 2023-06-09: qty 100

## 2023-06-09 MED ORDER — LACTATED RINGERS IV SOLN: INTRAVENOUS | Status: DC

## 2023-06-09 SURGICAL SUPPLY — 25 items
BLOCK BITE 60FR ADLT L/F BLUE (MISCELLANEOUS) ×2 IMPLANT
ELECT REM PT RETURN 9FT ADLT (ELECTROSURGICAL)
ELECTRODE REM PT RTRN 9FT ADLT (ELECTROSURGICAL) IMPLANT
FCP BXJMBJMB 240X2.8X (CUTTING FORCEPS)
FLOOR PAD 36X40 (MISCELLANEOUS) ×2
FORCEP RJ3 GP 1.8X160 W-NEEDLE (CUTTING FORCEPS) IMPLANT
FORCEPS BIOP RAD 4 LRG CAP 4 (CUTTING FORCEPS) IMPLANT
FORCEPS BIOP RJ4 240 W/NDL (CUTTING FORCEPS)
FORCEPS BXJMBJMB 240X2.8X (CUTTING FORCEPS) IMPLANT
INJECTOR/SNARE I SNARE (MISCELLANEOUS) IMPLANT
LUBRICANT JELLY 4.5OZ STERILE (MISCELLANEOUS) IMPLANT
MANIFOLD NEPTUNE II (INSTRUMENTS) IMPLANT
NDL SCLEROTHERAPY 25GX240 (NEEDLE) IMPLANT
NEEDLE SCLEROTHERAPY 25GX240 (NEEDLE)
PAD FLOOR 36X40 (MISCELLANEOUS) ×2 IMPLANT
PROBE APC STR FIRE (PROBE) IMPLANT
PROBE INJECTION GOLD (MISCELLANEOUS)
PROBE INJECTION GOLD 7FR (MISCELLANEOUS) IMPLANT
SNARE ROTATE MED OVAL 20MM (MISCELLANEOUS) IMPLANT
SNARE SHORT THROW 13M SML OVAL (MISCELLANEOUS) IMPLANT
SYR 50ML LL SCALE MARK (SYRINGE) IMPLANT
TRAP SPECIMEN MUCOUS 40CC (MISCELLANEOUS) IMPLANT
TUBING ENDO SMARTCAP PENTAX (MISCELLANEOUS) ×4 IMPLANT
TUBING IRRIGATION ENDOGATOR (MISCELLANEOUS) ×2 IMPLANT
WATER STERILE IRR 1000ML POUR (IV SOLUTION) IMPLANT

## 2023-06-09 NOTE — Op Note (Signed)
[facility] Patient Name: [name] Procedure Date: 06/09/2023 MRN: [id number] Attending MD: Jeani Hawking , MD, [id number] Date of Birth: 1936-05-30 CSN: [id number] Age: 87 Admit Type: Outpatient Procedure:                Upper GI endoscopy Indications:              Iron deficiency anemia Providers:                Jeani Hawking, MD, Lorenza Evangelist, RN, Harrington Challenger,                            Technician Referring MD:             Jeani Hawking, MD Medicines:                 Complications:            No immediate complications. Estimated Blood Loss:     Estimated blood loss: none. Procedure:                Pre-Anesthesia Assessment:                           - Prior to the procedure, a History and Physical                            was performed, and patient medications and                            allergies were reviewed. The patient's tolerance of                            previous anesthesia was also reviewed. The risks                            and benefits of the procedure and the sedation                            options and risks were discussed with the patient.                            All questions were answered, and informed consent                            was obtained. Prior Anticoagulants: The patient has                            taken no anticoagulant or antiplatelet agents. ASA                            Grade Assessment: II - A patient with mild systemic                            disease. After reviewing the risks and benefits,  the patient was deemed in satisfactory condition to                            undergo the procedure.                           - Sedation was administered by an anesthesia                            professional. Deep sedation was attained.                           After obtaining informed consent, the endoscope was                            passed under direct vision. Throughout the                             procedure, the patient's blood pressure, pulse, and                            oxygen saturations were monitored continuously. The                            [id number]-HQ190L ([id number]) Olympus colonoscope was                            introduced through the mouth, and advanced to the                            second part of duodenum. The upper GI endoscopy was                            accomplished without difficulty. The patient                            tolerated the procedure well. Scope In: Scope Out: Findings:      A 5 cm hiatal hernia was present.      The stomach was normal.      The examined duodenum was normal. Impression:               - 5 cm hiatal hernia.                           - Normal stomach.                           - Normal examined duodenum.                           - No specimens collected. Moderate Sedation:      Not Applicable - Patient had care per Anesthesia. Recommendation:           - Patient has a contact number available for  emergencies. The signs and symptoms of potential                            delayed complications were discussed with the                            patient. Return to normal activities tomorrow.                            Written discharge instructions were provided to the                            patient.                           - Resume previous diet.                           - Continue present medications.                           - Proceed with the colonoscopy. Procedure Code(s):        --- Professional ---                           [id number], Esophagogastroduodenoscopy, flexible,                            transoral; diagnostic, including collection of                            specimen(s) by brushing or washing, when performed                            (separate procedure) Diagnosis Code(s):        --- Professional ---                           D50.9, Iron deficiency  anemia, unspecified                           K44.9, Diaphragmatic hernia without obstruction or                            gangrene CPT copyright 2022 American Medical Association. All rights reserved. The codes documented in this report are preliminary and upon coder review may  be revised to meet current compliance requirements. Jeani Hawking, MD Jeani Hawking, MD 06/09/2023 10:42:05 AM This report has been signed electronically. Number of Addenda: 0

## 2023-06-09 NOTE — Transfer of Care (Signed)
Immediate Anesthesia Transfer of Care Note  Patient: ADELINE HUEBSCH  Procedure(s) Performed: ESOPHAGOGASTRODUODENOSCOPY (EGD) WITH PROPOFOL COLONOSCOPY WITH PROPOFOL POLYPECTOMY  Patient Location: PACU and Endoscopy Unit  Anesthesia Type:MAC  Level of Consciousness: awake, alert , oriented, and patient cooperative  Airway & Oxygen Therapy: Patient Spontanous Breathing and Patient connected to face mask oxygen  Post-op Assessment: Report given to RN, Post -op Vital signs reviewed and stable, and Patient moving all extremities  Post vital signs: Reviewed and stable  Last Vitals:  Vitals Value Taken Time  BP 122/43 06/09/23 1042  Temp    Pulse 67 06/09/23 1043  Resp 20 06/09/23 1043  SpO2 100 % 06/09/23 1043  Vitals shown include unfiled device data.  Last Pain:  Vitals:   06/09/23 1042  TempSrc:   PainSc: 0-No pain         Complications: No notable events documented.

## 2023-06-09 NOTE — Anesthesia Postprocedure Evaluation (Signed)
Anesthesia Post Note  Patient: [name]  Procedure(s) Performed: ESOPHAGOGASTRODUODENOSCOPY (EGD) WITH PROPOFOL COLONOSCOPY WITH PROPOFOL POLYPECTOMY     Patient location during evaluation: Endoscopy Anesthesia Type: MAC Level of consciousness: awake Pain management: pain level controlled Vital Signs Assessment: post-procedure vital signs reviewed and stable Respiratory status: spontaneous breathing, nonlabored ventilation and respiratory function stable Cardiovascular status: blood pressure returned to baseline and stable Postop Assessment: no apparent nausea or vomiting Anesthetic complications: no   No notable events documented.  Last Vitals:  Vitals:   06/09/23 1050 06/09/23 1100  BP: (!) 132/46 (!) 130/52  Pulse: 64 62  Resp: (!) 21 19  Temp:    SpO2: 97% 95%    Last Pain:  Vitals:   06/09/23 1100  TempSrc:   PainSc: 0-No pain                  P 

## 2023-06-09 NOTE — Discharge Instructions (Signed)
YOU HAD AN ENDOSCOPIC PROCEDURE TODAY: Refer to the procedure report and other information in the discharge instructions given to you for any specific questions about what was found during the examination. If this information does not answer your questions, please call [facility] at [phone] to clarify.   YOU SHOULD EXPECT: Some feelings of bloating in the abdomen. Passage of more gas than usual. Walking can help get rid of the air that was put into your GI tract during the procedure and reduce the bloating. If you had a lower endoscopy (such as a colonoscopy or flexible sigmoidoscopy) you may notice spotting of blood in your stool or on the toilet paper. Some abdominal soreness may be present for a day or two, also.  DIET: Your first meal following the procedure should be a light meal and then it is ok to progress to your normal diet. A half-sandwich or bowl of soup is an example of a good first meal. Heavy or fried foods are harder to digest and may make you feel nauseous or bloated. Drink plenty of fluids but you should avoid alcoholic beverages for 24 hours.  ACTIVITY: Your care partner should take you home directly after the procedure. You should plan to take it easy, moving slowly for the rest of the day. You can resume normal activity the day after the procedure however YOU SHOULD NOT DRIVE, use power tools, machinery or perform tasks that involve climbing or major physical exertion for 24 hours (because of the sedation medicines used during the test).   SYMPTOMS TO REPORT IMMEDIATELY: A gastroenterologist can be reached at any hour. Please call [phone]  for any of the following symptoms:  Following lower endoscopy (colonoscopy, flexible sigmoidoscopy) Excessive amounts of blood in the stool  Significant tenderness, worsening of abdominal pains  Swelling of the abdomen that is new, acute  Fever of 100 or higher  Following upper endoscopy (EGD, EUS, ERCP, esophageal  dilation) Vomiting of blood or coffee ground material  New, significant abdominal pain  New, significant chest pain or pain under the shoulder blades  Painful or persistently difficult swallowing  New shortness of breath  Black, tarry-looking or red, bloody stools  FOLLOW UP:  If any biopsies were taken you will be contacted by phone or by letter within the next 1-3 weeks. Call [phone]  if you have not heard about the biopsies in 3 weeks.  Please also call with any specific questions about appointments or follow up tests.

## 2023-06-09 NOTE — Op Note (Signed)
[facility] Patient Name: [name] Procedure Date: 06/09/2023 MRN: [id number] Attending MD: Jeani Hawking , MD, [id number] Date of Birth: October 10, 1936 CSN: [id number] Age: 87 Admit Type: Outpatient Procedure:                Colonoscopy Indications:              Heme positive stool, Melena Providers:                Jeani Hawking, MD, Lorenza Evangelist, RN, Harrington Challenger,                            Technician Referring MD:             Jeani Hawking, MD Medicines:                Propofol per Anesthesia Complications:            No immediate complications. Estimated Blood Loss:     Estimated blood loss: none. Procedure:                Pre-Anesthesia Assessment:                           - Prior to the procedure, a History and Physical                            was performed, and patient medications and                            allergies were reviewed. The patient's tolerance of                            previous anesthesia was also reviewed. The risks                            and benefits of the procedure and the sedation                            options and risks were discussed with the patient.                            All questions were answered, and informed consent                            was obtained. Prior Anticoagulants: The patient has                            taken no anticoagulant or antiplatelet agents. ASA                            Grade Assessment: II - A patient with mild systemic                            disease. After reviewing the risks and benefits,  the patient was deemed in satisfactory condition to                            undergo the procedure.                           - Sedation was administered by an anesthesia                            professional. Deep sedation was attained.                           After obtaining informed consent, the colonoscope                            was passed under direct  vision. Throughout the                            procedure, the patient's blood pressure, pulse, and                            oxygen saturations were monitored continuously. The                            PCF-HQ190L ([id number]) Olympus colonoscope was                            introduced through the anus and advanced to the the                            cecum, identified by appendiceal orifice and                            ileocecal valve. The colonoscopy was somewhat                            difficult due to significant looping. Successful                            completion of the procedure was aided by using                            manual pressure and straightening and shortening                            the scope to obtain bowel loop reduction. The                            patient tolerated the procedure well. The quality                            of the bowel preparation was evaluated using the  BBPS [facility] Bowel Preparation Scale) with scores                            of: Right Colon = 3, Transverse Colon = 3 and Left                            Colon = 3 (entire mucosa seen well with no residual                            staining, small fragments of stool or opaque                            liquid). The total BBPS score equals 9. The                            ileocecal valve, appendiceal orifice, and rectum                            were photographed. Scope In: 10:14:53 AM Scope Out: 10:35:59 AM Scope Withdrawal Time: 0 hours 13 minutes 17 seconds  Total Procedure Duration: 0 hours 21 minutes 6 seconds  Findings:      A 3 mm polyp was found in the descending colon. The polyp was sessile.       The polyp was removed with a cold snare. Resection and retrieval were       complete.      Scattered medium-mouthed diverticula were found in the sigmoid colon and       descending colon. Impression:               - One 3 mm polyp in the  descending colon, removed                            with a cold snare. Resected and retrieved.                           - Diverticulosis in the sigmoid colon and in the                            descending colon. Moderate Sedation:      Not Applicable - Patient had care per Anesthesia. Recommendation:           - Patient has a contact number available for                            emergencies. The signs and symptoms of potential                            delayed complications were discussed with the                            patient. Return to normal activities tomorrow.                            Written discharge instructions were provided to  the                            patient.                           - Resume previous diet.                           - Continue present medications.                           - Await pathology results.                           - Repeat colonoscopy is not recommended for                            surveillance.                           - Follow up with Dr. Loreta Ave in one month. Procedure Code(s):        --- Professional ---                           [phone], Colonoscopy, flexible; with removal of                            tumor(s), polyp(s), or other lesion(s) by snare                            technique Diagnosis Code(s):        --- Professional ---                           D12.4, Benign neoplasm of descending colon                           R19.5, Other fecal abnormalities                           K92.1, Melena (includes Hematochezia)                           K57.30, Diverticulosis of large intestine without                            perforation or abscess without bleeding CPT copyright 2022 American Medical Association. All rights reserved. The codes documented in this report are preliminary and upon coder review may  be revised to meet current compliance requirements. Jeani Hawking, MD Jeani Hawking, MD 06/09/2023 10:44:57 AM This  report has been signed electronically. Number of Addenda: 0

## 2023-06-09 NOTE — Anesthesia Preprocedure Evaluation (Signed)
Anesthesia Evaluation  Patient identified by MRN, date of birth, ID band Patient awake    Reviewed: Allergy & Precautions, NPO status , Patient's Chart, lab work & pertinent test results  History of Anesthesia Complications (+) DIFFICULT AIRWAY and history of anesthetic complications  Airway Mallampati: IV  TM Distance: >3 FB Neck ROM: Full    Dental no notable dental hx.    Pulmonary sleep apnea , former smoker   Pulmonary exam normal        Cardiovascular hypertension, Pt. on medications Normal cardiovascular exam     Neuro/Psych  PSYCHIATRIC DISORDERS Anxiety Depression    negative neurological ROS     GI/Hepatic Neg liver ROS, hiatal hernia, Bowel prep,GERD  Medicated and Controlled,,  Endo/Other  negative endocrine ROS    Renal/GU Renal disease     Musculoskeletal  (+) Arthritis ,    Abdominal   Peds  Hematology negative hematology ROS (+)   Anesthesia Other Findings anemia/guaiac positive stool  Reproductive/Obstetrics                             Anesthesia Physical Anesthesia Plan  ASA: 3  Anesthesia Plan: MAC   Post-op Pain Management:    Induction: Intravenous  PONV Risk Score and Plan: 1 and Propofol infusion and Treatment may vary due to age or medical condition  Airway Management Planned: Nasal Cannula  Additional Equipment:   Intra-op Plan:   Post-operative Plan:   Informed Consent: I have reviewed the patients History and Physical, chart, labs and discussed the procedure including the risks, benefits and alternatives for the proposed anesthesia with the patient or authorized representative who has indicated his/her understanding and acceptance.     Dental advisory given  Plan Discussed with: CRNA  Anesthesia Plan Comments:        Anesthesia Quick Evaluation

## 2023-06-09 NOTE — H&P (Signed)
[name] HPI: This 87 year old [place] male presents to the office for evalution of guaiac positive stool cards.  He had noticed some black tarry stools last week and therefore stool cards were given to him from the office. He feels this lorazepam he was on caused him to have a blood in the stool and therefore he stopped taking the Clonazepam and since then he claims that the stools have normalized in color.  He averages 2 BM's per day with no obvious blood or mucus in the stool. He takes Omeprazole 40 mg for acid reflux with good control. He has good appetite and his weight is stable. He denies any complaints of abdominal pain, nausea, vomiting, dysphagia or odynophagia. He denies having a family history of colon cancer, celiac sprue, or IBD. He had a normal colonoscopy done 08/2011. He had adenomatous polyps removed in 2004.  Past Medical History:  Diagnosis Date   Anxiety    BPH (benign prostatic hypertrophy)    DDD (degenerative disc disease), lumbar 2002   Depression    Difficult intubation 02/22/2019   Anterior with limited oral opening   Essential hypertension 03/10/2019   GERD (gastroesophageal reflux disease)    History of colon polyps    History of hiatal hernia 2012   Hyperlipemia    Mild   Indwelling Foley catheter present    Iron deficiency anemia    IV iron therapy    Mild hyperlipidemia 02/20/2008   Qualifier: Diagnosis of  By: Cato Mulligan MD, Bruce     Pre-diabetes    Renal cyst 2008   7.6 cm lower pole right renal cyst   Sleep apnea    slight, no CPAP used    Past Surgical History:  Procedure Laterality Date   COLONOSCOPY  08/2011   NASAL SEPTOPLASTY W/ TURBINOPLASTY  08/03/2012   Procedure: NASAL SEPTOPLASTY WITH TURBINATE REDUCTION;  Surgeon: Serena Colonel, MD;  Location: [facility] OR;  Service: ENT;  Laterality: Bilateral;   UPPER GASTROINTESTINAL ENDOSCOPY  08/2011   XI ROBOTIC ASSISTED SIMPLE PROSTATECTOMY N/A 02/22/2019   Procedure: XI ROBOTIC ASSISTED SIMPLE  PROSTATECTOMY;  Surgeon: Sebastian Ache, MD;  Location: [facility] ORS;  Service: Urology;  Laterality: N/A;    Family History  Problem Relation Age of Onset   Heart attack Father    Hypertension Father    Hypertension Daughter    Hypertension Brother    Hypertension Brother    Hypertension Sister     Social History:  reports that he quit smoking about 55 years ago. His smoking use included cigarettes. He started smoking about 65 years ago. He has a 5 pack-year smoking history. He has never used smokeless tobacco. He reports current alcohol use of about 1.0 standard drink of alcohol per week. He reports that he does not use drugs.  Allergies:  Allergies  Allergen Reactions   Dexlansoprazole     Other reaction(s): Unknown    Medications: Scheduled: Continuous:  sodium chloride     lactated ringers      No results found for this or any previous visit (from the past 24 hour(s)).   No results found.  ROS:  As stated above in the HPI otherwise negative.  Height 5\' 7"  (1.702 m), weight 62.1 kg.    PE: Gen: NAD, Alert and Oriented HEENT:  [place]/AT, EOMI Neck: Supple, no LAD Lungs: CTA Bilaterally CV: RRR without M/G/R ABD: Soft, NTND, +BS Ext: No C/C/E  Assessment/Plan: 1) Heme positive stool. 2) Melena. 3) Anemia.  Plan: 1) EGD/colonoscopy.  , D 06/09/2023, 9:08 AM

## 2023-06-12 ENCOUNTER — Encounter (HOSPITAL_COMMUNITY): Payer: Self-pay | Admitting: Gastroenterology

## 2023-06-19 ENCOUNTER — Telehealth (HOSPITAL_COMMUNITY): Payer: Medicare Other | Admitting: Psychiatry

## 2023-06-19 ENCOUNTER — Encounter (HOSPITAL_COMMUNITY): Payer: Self-pay | Admitting: Psychiatry

## 2023-06-19 VITALS — Wt 137.0 lb

## 2023-06-19 DIAGNOSIS — F332 Major depressive disorder, recurrent severe without psychotic features: Secondary | ICD-10-CM | POA: Diagnosis not present

## 2023-06-19 DIAGNOSIS — F419 Anxiety disorder, unspecified: Secondary | ICD-10-CM | POA: Diagnosis not present

## 2023-06-19 MED ORDER — ESCITALOPRAM OXALATE 20 MG PO TABS
ORAL_TABLET | ORAL | 0 refills | Status: DC
Start: 2023-06-19 — End: 2023-09-19

## 2023-06-19 MED ORDER — ARIPIPRAZOLE 2 MG PO TABS
2.0000 mg | ORAL_TABLET | Freq: Every day | ORAL | 0 refills | Status: DC
Start: 2023-06-19 — End: 2023-09-19

## 2023-06-19 NOTE — Progress Notes (Signed)
[facility] MD Virtual Progress Note   Patient Location: Home Provider Location: Home Office  I connect with patient by video and verified that I am speaking with correct person by using two identifiers. I discussed the limitations of evaluation and management by telemedicine and the availability of in person appointments. I also discussed with the patient that there may be a patient responsible charge related to this service. The patient expressed understanding and agreed to proceed.  [name] [id number] 87 y.o.  06/19/2023 10:42 AM  History of Present Illness:  Patient is evaluated by video session today.  He is doing much better with the addition of low-dose Abilify.  He noticed much improvement in his motivation, energy and he started playing golf at least once a week.  He also started reading every day that he has not done in a while.  He noticed that family around him also seeing improvement in his energy level, depression and anxiety.  He sleeps good.  He takes Abilify around 7:00.  If he that he does not need that much sleep and he is up early in the morning.  He has no tremors, shakes or any EPS.  He denies any mania, psychosis, crying spells or any feeling of hopelessness or worthlessness.  Recently had a colonoscopy and endoscopy after he had a fear that he has a black stool.  However he was told everything is good.  Initially we try REXULTI but it was very expensive and insurance did not cover.  He does not want to change the medication.  He has not taken Ativan in a while.  He did try Klonopin but he felt that was causing black stool.  Patient like to continue current medication.  Past Psychiatric History: H/O depression and anxiety most of his life.  Had a good response with TMS and Lexapro. Tried Celexa, Pristiq, Remeron, Cymbalta and Xanax but limited outcome. Klonopin and Buspar made groggy.  No h/o suicidal attempt, mania, psychosis or self abusive behavior.   Saw Dr. Dellia Cloud in the past for therapy.    He had genetic testing and the results are in the chart.     Outpatient Encounter Medications as of 06/19/2023  Medication Sig   acetaminophen (TYLENOL) 500 MG tablet Take 1,000 mg by mouth every 6 (six) hours as needed for moderate pain or headache.   amLODipine (NORVASC) 10 MG tablet TAKE 1 TABLET(10 MG) BY MOUTH DAILY   ARIPiprazole (ABILIFY) 2 MG tablet Take 1 tablet (2 mg total) by mouth daily.   aspirin EC 81 MG tablet Take 1 tablet (81 mg total) by mouth daily. Swallow whole.   atorvastatin (LIPITOR) 10 MG tablet Take 1 tablet (10 mg total) by mouth daily.   clobetasol cream (TEMOVATE) 0.05 % Apply 1 application  topically 2 (two) times daily.   escitalopram (LEXAPRO) 20 MG tablet TAKE 1 AND 1/2 TABLETS BY MOUTH DAILY   finasteride (PROSCAR) 5 MG tablet Take 5 mg by mouth daily.   hydrocortisone 2.5 % cream Apply topically 2 (two) times daily as needed. (Patient taking differently: Apply 1 application  topically 2 (two) times daily as needed (rash).)   LORazepam (ATIVAN) 0.5 MG tablet Take 1 tablet daily as needed for severe panic attacks.   omeprazole (PRILOSEC) 40 MG capsule TAKE 1 CAPSULE BY MOUTH DAILY   valsartan (DIOVAN) 160 MG tablet TAKE 1 TABLET(160 MG) BY MOUTH DAILY   No facility-administered encounter medications on file as of 06/19/2023.  Recent Results (from the past 2160 hour(s))  Surgical pathology     Status: None   Collection Time: 06/09/23 10:18 AM  Result Value Ref Range   SURGICAL PATHOLOGY      SURGICAL PATHOLOGY CASE: [id number] PATIENT: [facility] Surgical Pathology Report     Clinical History: Anemia/Guaiac positive stool (crm)     FINAL MICROSCOPIC DIAGNOSIS:  A. COLON, DESCENDING, POLYPECTOMY: -  Hyperplastic polyp.    GROSS DESCRIPTION:  Received in formalin is a tan, soft tissue fragment that is submitted in toto.  Size: 0.4 x 0.3 x 0.2 cm, 1 block submitted. (KW,  06/09/2023)    Final Diagnosis performed by Orene Desanctis DO.   Electronically signed 06/12/2023 Technical and / or Professional components performed at [facility], [address], [place], [place] [address].  Immunohistochemistry Technical component (if applicable) was performed at [facility] [address], [address], [place], [place] [address].   IMMUNOHISTOCHEMISTRY DISCLAIMER (if applicable): Some of these immunohistochemical stains may have been developed and the performance characteristics determine by [facility]. Some may not have been cleared or approved by the U.S. Food and Drug Administration. The FDA has determined that such clearance or approval is not necessary. This test is used for clinical purposes. It should not be regarded as investigational or for research. This laboratory is certified under the Clinical Laboratory Improvement Amendments of 1988 (CLIA-88) as qualified to perform high complexity clinical laboratory testing.  The controls stained appropriately.   IHC stains are performed on formalin fixed, paraffin embedded tissue using a 3,3"diaminobenzidine (DAB) chromogen and Leica Bond Autostainer System. The staining intensity of the nucleus is score manually and is reported as the percentage of tumor cell nuclei demonstrating specific nuclear staining. The specimens are fixed in 10% Neutral Formalin for at least 6 hours and up to 72hrs. These tests are validated on decalcified tissue. Results should be interpreted with caution given the possibility of false neg ative results on decalcified specimens. Antibody Clones are as follows ER-clone 35F, PR-clone 16, Ki67- clone MM1. Some of these immunohistochemical stains may have been developed and the performance characteristics determined by [facility].      Psychiatric Specialty Exam: Physical Exam  Review of Systems  Weight 137 lb (62.1 kg).There  is no height or weight on file to calculate BMI.  General Appearance: Casual  Eye Contact:  Good  Speech:  Clear and Coherent and Normal Rate  Volume:  Normal  Mood:  Euthymic  Affect:  Appropriate  Thought Process:  Goal Directed  Orientation:  Full (Time, Place, and Person)  Thought Content:  Logical  Suicidal Thoughts:  No  Homicidal Thoughts:  No  Memory:  Immediate;   Good Recent;   Good Remote;   Good  Judgement:  Good  Insight:  Good  Psychomotor Activity:  Normal  Concentration:  Concentration: Good and Attention Span: Good  Recall:  Good  Fund of Knowledge:  Good  Language:  Good  Akathisia:  No  Handed:  Right  AIMS (if indicated):     Assets:  Communication Skills Desire for Improvement Housing Social Support Transportation  ADL's:  Intact  Cognition:  WNL  Sleep:  ok     Assessment/Plan: Major depressive disorder, recurrent, severe without psychotic features (HCC) - Plan: ARIPiprazole (ABILIFY) 2 MG tablet, escitalopram (LEXAPRO) 20 MG tablet  Anxiety - Plan: ARIPiprazole (ABILIFY) 2 MG tablet, escitalopram (LEXAPRO) 20 MG tablet  Patient doing better on current medication.  He liked the low-dose Abilify.  He has not taken Ativan in the past 3 weeks.  He has no dizziness, tremors, shakes or any EPS.  I reviewed current medication.  Continue Lexapro 30 mg daily and Abilify 2 mg daily.  Recommended to call us back if is any question or any concern.  Follow-up in 3 months.  Continue Ativan if needed for severe anxiety.   Follow Up Instructions:     I discussed the assessment and treatment plan with the patient. The patient was provided an opportunity to ask questions and all were answered. The patient agreed with the plan and demonstrated an understanding of the instructions.   The patient was advised to call back or seek an in-person evaluation if the symptoms worsen or if the condition fails to improve as anticipated.    Collaboration of Care: Other  provider involved in patient's care AEB notes are available in epic to review.  Patient/Guardian was advised Release of Information must be obtained prior to any record release in order to collaborate their care with an outside provider. Patient/Guardian was advised if they have not already done so to contact the registration department to sign all necessary forms in order for [name] to release information regarding their care.   Consent: Patient/Guardian gives verbal consent for treatment and assignment of benefits for services provided during this visit. Patient/Guardian expressed understanding and agreed to proceed.     I provided 20 minutes of non face to face time during this encounter.  Note: This document was prepared by [employer] voice dictation technology and any errors that results from this process are unintentional.    Cleotis Nipper, MD 06/19/2023

## 2023-06-23 ENCOUNTER — Ambulatory Visit (INDEPENDENT_AMBULATORY_CARE_PROVIDER_SITE_OTHER): Payer: Medicare Other | Admitting: Family Medicine

## 2023-06-23 VITALS — BP 118/60 | HR 71 | Temp 97.6°F | Ht 67.0 in | Wt 139.3 lb

## 2023-06-23 DIAGNOSIS — R600 Localized edema: Secondary | ICD-10-CM | POA: Diagnosis not present

## 2023-06-23 DIAGNOSIS — G4700 Insomnia, unspecified: Secondary | ICD-10-CM

## 2023-06-23 NOTE — Progress Notes (Signed)
Established Patient Office Visit  Subjective   Patient ID: [name], male    DOB: June 29, 1936  Age: 87 y.o. MRN: [id number]  Chief Complaint  Patient presents with   Edema    HPI   [name] is seen today for the following items  Approximately 2-week history of bilateral ankle and foot edema.  Generally stays quite active.  No history of heart failure.  He has history of aortic murmur secondary to mitral insufficiency and regurgitation.  Does take amlodipine 10 mg daily.  No recent change in medication.  Only new addition since last visit was Abilify 2 mg at night per psychiatry.  No associated pain in his legs or ankle.  No orthopnea and no dyspnea with exertion.  His weight has been stable. He has been on amlodipine for at least 5 years for hypertension and also takes valsartan 160 milligrams daily.  Also complained of insomnia.  He usually goes to bed around midnight and frequently wakes up around 4 AM and cannot get back to sleep.  Does drink a fair amount of caffeine including up to 9 PM at night.  No alcohol.  Past Medical History:  Diagnosis Date   Anxiety    BPH (benign prostatic hypertrophy)    DDD (degenerative disc disease), lumbar 2002   Depression    Difficult intubation 02/22/2019   Anterior with limited oral opening   Essential hypertension 03/10/2019   GERD (gastroesophageal reflux disease)    History of colon polyps    History of hiatal hernia 2012   Hyperlipemia    Mild   Indwelling Foley catheter present    Iron deficiency anemia    IV iron therapy    Mild hyperlipidemia 02/20/2008   Qualifier: Diagnosis of  By: Cato Mulligan MD, Somara Frymire     Pre-diabetes    Renal cyst 2008   7.6 cm lower pole right renal cyst   Sleep apnea    slight, no CPAP used   Past Surgical History:  Procedure Laterality Date   COLONOSCOPY  08/2011   COLONOSCOPY WITH PROPOFOL N/A 06/09/2023   Procedure: COLONOSCOPY WITH PROPOFOL;  Surgeon: Jeani Hawking, MD;  Location: [facility];   Service: Gastroenterology;  Laterality: N/A;   ESOPHAGOGASTRODUODENOSCOPY (EGD) WITH PROPOFOL N/A 06/09/2023   Procedure: ESOPHAGOGASTRODUODENOSCOPY (EGD) WITH PROPOFOL;  Surgeon: Jeani Hawking, MD;  Location: [facility];  Service: Gastroenterology;  Laterality: N/A;   NASAL SEPTOPLASTY W/ TURBINOPLASTY  08/03/2012   Procedure: NASAL SEPTOPLASTY WITH TURBINATE REDUCTION;  Surgeon: Serena Colonel, MD;  Location: [facility] OR;  Service: ENT;  Laterality: Bilateral;   POLYPECTOMY  06/09/2023   Procedure: POLYPECTOMY;  Surgeon: Jeani Hawking, MD;  Location: [facility];  Service: Gastroenterology;;   UPPER GASTROINTESTINAL ENDOSCOPY  08/2011   XI ROBOTIC ASSISTED SIMPLE PROSTATECTOMY N/A 02/22/2019   Procedure: XI ROBOTIC ASSISTED SIMPLE PROSTATECTOMY;  Surgeon: Sebastian Ache, MD;  Location: [facility];  Service: Urology;  Laterality: N/A;    reports that he quit smoking about 55 years ago. His smoking use included cigarettes. He started smoking about 65 years ago. He has a 5 pack-year smoking history. He has never used smokeless tobacco. He reports current alcohol use of about 1.0 standard drink of alcohol per week. He reports that he does not use drugs. family history includes Heart attack in his father; Hypertension in his brother, brother, daughter, father, and sister. Allergies  Allergen Reactions   Dexlansoprazole     Other reaction(s): Unknown    Review of Systems  Constitutional:  Negative for chills, fever and malaise/fatigue.  Eyes:  Negative for blurred vision.  Respiratory:  Negative for shortness of breath.   Cardiovascular:  Positive for leg swelling. Negative for chest pain, orthopnea and PND.  Neurological:  Negative for dizziness, weakness and headaches.      Objective:     BP 118/60 (BP Location: Left Arm, Patient Position: Sitting, Cuff Size: Normal)   Pulse 71   Temp 97.6 F (36.4 C) (Oral)   Ht 5\' 7"  (1.702 m)   Wt 139 lb 4.8 oz (63.2 kg)   SpO2 97%   BMI 21.82 kg/m  BP  Readings from Last 3 Encounters:  06/23/23 118/60  06/09/23 (!) 130/52  11/14/22 135/66   Wt Readings from Last 3 Encounters:  06/23/23 139 lb 4.8 oz (63.2 kg)  06/09/23 137 lb (62.1 kg)  11/14/22 141 lb 12.8 oz (64.3 kg)      Physical Exam Vitals reviewed.  Constitutional:      Appearance: He is well-developed.  HENT:     Right Ear: External ear normal.     Left Ear: External ear normal.  Eyes:     Pupils: Pupils are equal, round, and reactive to light.  Neck:     Thyroid: No thyromegaly.  Cardiovascular:     Rate and Rhythm: Normal rate and regular rhythm.     Heart sounds: Murmur heard.     Comments: He has systolic murmur over the aortic valve consistent with his previously noted aortic regurgitation Pulmonary:     Effort: Pulmonary effort is normal. No respiratory distress.     Breath sounds: Normal breath sounds. No wheezing or rales.  Musculoskeletal:     Cervical back: Neck supple.     Comments: He has some mild pitting edema feet and ankles bilaterally.  No edema in the legs and this is confined to the ankles and feet  Neurological:     Mental Status: He is alert and oriented to person, place, and time.      No results found for any visits on 06/23/23.  Last CBC Lab Results  Component Value Date   WBC 5.8 02/17/2021   HGB 12.8 (L) 02/17/2021   HCT 39.6 02/17/2021   MCV 78.3 02/17/2021   MCH 26.2 09/25/2019   RDW 15.1 02/17/2021   PLT 213.0 02/17/2021   Last metabolic panel Lab Results  Component Value Date   GLUCOSE 103 (H) 06/03/2022   NA 133 (L) 06/03/2022   K 4.2 06/03/2022   CL 100 06/03/2022   CO2 28 06/03/2022   BUN 18 06/03/2022   CREATININE 0.87 06/03/2022   GFR 79.61 06/03/2022   CALCIUM 8.9 06/03/2022   PROT 6.8 06/03/2022   ALBUMIN 4.1 06/03/2022   BILITOT 0.5 06/03/2022   ALKPHOS 52 06/03/2022   AST 16 06/03/2022   ALT 21 06/03/2022   ANIONGAP 7 03/26/2019   Last thyroid functions Lab Results  Component Value Date   TSH  3.02 02/17/2021      The ASCVD Risk score (Arnett DK, et al., 2019) failed to calculate for the following reasons:   The 2019 ASCVD risk score is only valid for ages 40 to 75    Assessment & Plan:   #1 bilateral foot and ankle edema.  Onset about 2 weeks ago.  He does take amlodipine 10 mg daily but has been on this for quite some time.  Denies any recent weight gain, orthopnea, dyspnea on exertion.  No history of heart  failure.  -Elevate legs frequently -Check labs including comprehensive metabolic panel, BNP, TSH, urine microalbumin -Consider reducing amlodipine to 5 mg daily until follow-up in 2 weeks since his blood pressure appears to be well-controlled at this time -Follow-up immediately for any dyspnea or progressive edema  #2 insomnia.  Is consuming caffeine daily frequently up until 9 PM at night.  We strongly advise no caffeine consumption after about 2 PM.  Handout given on sleep hygiene.  Reassess in 2 weeks.   Return in about 2 weeks (around 07/07/2023).    Evelena Peat, MD

## 2023-06-23 NOTE — Patient Instructions (Signed)
Reduce the Amlodipine to 5 mg daily (one half tablet per day) until follow up.    Set up 2 week follow up.

## 2023-06-24 LAB — COMPREHENSIVE METABOLIC PANEL
AG Ratio: 1.5 (calc) (ref 1.0–2.5)
ALT: 16 U/L (ref 9–46)
AST: 17 U/L (ref 10–35)
Albumin: 4 g/dL (ref 3.6–5.1)
Alkaline phosphatase (APISO): 62 U/L (ref 35–144)
BUN: 18 mg/dL (ref 7–25)
CO2: 25 mmol/L (ref 20–32)
Calcium: 8.9 mg/dL (ref 8.6–10.3)
Chloride: 103 mmol/L (ref 98–110)
Creat: 0.93 mg/dL (ref 0.70–1.22)
Globulin: 2.6 g/dL (ref 1.9–3.7)
Glucose, Bld: 87 mg/dL (ref 65–99)
Potassium: 4.6 mmol/L (ref 3.5–5.3)
Sodium: 136 mmol/L (ref 135–146)
Total Bilirubin: 0.2 mg/dL (ref 0.2–1.2)
Total Protein: 6.6 g/dL (ref 6.1–8.1)

## 2023-06-24 LAB — MICROALBUMIN / CREATININE URINE RATIO
Creatinine, Urine: 93 mg/dL (ref 20–320)
Microalb Creat Ratio: 8 mg/g{creat} (ref <30)
Microalb, Ur: 0.7 mg/dL

## 2023-06-24 LAB — BRAIN NATRIURETIC PEPTIDE: Brain Natriuretic Peptide: 29 pg/mL (ref <100)

## 2023-06-24 LAB — TSH: TSH: 3.94 m[IU]/L (ref 0.40–4.50)

## 2023-06-30 ENCOUNTER — Ambulatory Visit (INDEPENDENT_AMBULATORY_CARE_PROVIDER_SITE_OTHER): Payer: Medicare Other | Admitting: Family Medicine

## 2023-06-30 ENCOUNTER — Encounter: Payer: Self-pay | Admitting: Family Medicine

## 2023-06-30 VITALS — BP 122/60 | HR 64 | Temp 98.0°F | Ht 67.0 in | Wt 138.6 lb

## 2023-06-30 DIAGNOSIS — I10 Essential (primary) hypertension: Secondary | ICD-10-CM

## 2023-06-30 DIAGNOSIS — M25472 Effusion, left ankle: Secondary | ICD-10-CM

## 2023-06-30 DIAGNOSIS — M25471 Effusion, right ankle: Secondary | ICD-10-CM | POA: Diagnosis not present

## 2023-06-30 MED ORDER — AMLODIPINE BESYLATE 5 MG PO TABS: 5.0000 mg | ORAL_TABLET | Freq: Every day | ORAL | 3 refills | Status: DC

## 2023-06-30 NOTE — Progress Notes (Signed)
Established Patient Office Visit  Subjective   Patient ID: [name], male    DOB: 03/14/1936  Age: 87 y.o. MRN: [id number]  Chief Complaint  Patient presents with   Medical Management of Chronic Issues    HPI   Recently seen with approximately 2-week history of bilateral ankle and foot edema.  No history of heart failure.  He was taking amlodipine 10 mg daily we suspected some of his edema may have been related to the amlodipine.  We obtained several labs including CMP, BNP level, TSH, urine microalbumin screen which were all basically normal.  No evidence for heart failure.  He denies any dyspnea.  We reduced his amlodipine from 10 to 5 mg daily and home blood pressures have remained stable.  His edema has improved significantly since doing this.  Recent albumin 4.0.  Enjoys playing golf usually once per week and just played yesterday.  No activity intolerance.  Past Medical History:  Diagnosis Date   Anxiety    BPH (benign prostatic hypertrophy)    DDD (degenerative disc disease), lumbar 2002   Depression    Difficult intubation 02/22/2019   Anterior with limited oral opening   Essential hypertension 03/10/2019   GERD (gastroesophageal reflux disease)    History of colon polyps    History of hiatal hernia 2012   Hyperlipemia    Mild   Indwelling Foley catheter present    Iron deficiency anemia    IV iron therapy    Mild hyperlipidemia 02/20/2008   Qualifier: Diagnosis of  By: Cato Mulligan MD, Khyron Garno     Pre-diabetes    Renal cyst 2008   7.6 cm lower pole right renal cyst   Sleep apnea    slight, no CPAP used   Past Surgical History:  Procedure Laterality Date   COLONOSCOPY  08/2011   COLONOSCOPY WITH PROPOFOL N/A 06/09/2023   Procedure: COLONOSCOPY WITH PROPOFOL;  Surgeon: Jeani Hawking, MD;  Location: [facility];  Service: Gastroenterology;  Laterality: N/A;   ESOPHAGOGASTRODUODENOSCOPY (EGD) WITH PROPOFOL N/A 06/09/2023   Procedure: ESOPHAGOGASTRODUODENOSCOPY (EGD) WITH  PROPOFOL;  Surgeon: Jeani Hawking, MD;  Location: [facility];  Service: Gastroenterology;  Laterality: N/A;   NASAL SEPTOPLASTY W/ TURBINOPLASTY  08/03/2012   Procedure: NASAL SEPTOPLASTY WITH TURBINATE REDUCTION;  Surgeon: Serena Colonel, MD;  Location: [facility] OR;  Service: ENT;  Laterality: Bilateral;   POLYPECTOMY  06/09/2023   Procedure: POLYPECTOMY;  Surgeon: Jeani Hawking, MD;  Location: [facility];  Service: Gastroenterology;;   UPPER GASTROINTESTINAL ENDOSCOPY  08/2011   XI ROBOTIC ASSISTED SIMPLE PROSTATECTOMY N/A 02/22/2019   Procedure: XI ROBOTIC ASSISTED SIMPLE PROSTATECTOMY;  Surgeon: Sebastian Ache, MD;  Location: [facility];  Service: Urology;  Laterality: N/A;    reports that he quit smoking about 55 years ago. His smoking use included cigarettes. He started smoking about 65 years ago. He has a 5 pack-year smoking history. He has never used smokeless tobacco. He reports current alcohol use of about 1.0 standard drink of alcohol per week. He reports that he does not use drugs. family history includes Heart attack in his father; Hypertension in his brother, brother, daughter, father, and sister. Allergies  Allergen Reactions   Dexlansoprazole     Other reaction(s): Unknown    Review of Systems  Constitutional:  Negative for malaise/fatigue.  Eyes:  Negative for blurred vision.  Respiratory:  Negative for shortness of breath.   Cardiovascular:  Negative for chest pain.  Neurological:  Negative for dizziness, weakness and headaches.  Objective:     BP 122/60 (BP Location: Left Arm, Cuff Size: Normal)   Pulse 64   Temp 98 F (36.7 C) (Oral)   Ht 5\' 7"  (1.702 m)   Wt 138 lb 9.6 oz (62.9 kg)   SpO2 98%   BMI 21.71 kg/m  BP Readings from Last 3 Encounters:  06/30/23 122/60  06/23/23 118/60  06/09/23 (!) 130/52   Wt Readings from Last 3 Encounters:  06/30/23 138 lb 9.6 oz (62.9 kg)  06/23/23 139 lb 4.8 oz (63.2 kg)  06/09/23 137 lb (62.1 kg)      Physical  Exam Vitals reviewed.  Constitutional:      Appearance: Normal appearance.  Cardiovascular:     Rate and Rhythm: Normal rate and regular rhythm.     Heart sounds: Murmur heard.  Pulmonary:     Effort: Pulmonary effort is normal.     Breath sounds: Normal breath sounds.  Musculoskeletal:     Comments: Edema has improved involving his ankles and feet.  Still has trace edema bilaterally but this has essentially resolved.  Neurological:     Mental Status: He is alert.      No results found for any visits on 06/30/23.  Last CBC Lab Results  Component Value Date   WBC 5.8 02/17/2021   HGB 12.8 (L) 02/17/2021   HCT 39.6 02/17/2021   MCV 78.3 02/17/2021   MCH 26.2 09/25/2019   RDW 15.1 02/17/2021   PLT 213.0 02/17/2021   Last metabolic panel Lab Results  Component Value Date   GLUCOSE 87 06/23/2023   NA 136 06/23/2023   K 4.6 06/23/2023   CL 103 06/23/2023   CO2 25 06/23/2023   BUN 18 06/23/2023   CREATININE 0.93 06/23/2023   GFR 79.61 06/03/2022   CALCIUM 8.9 06/23/2023   PROT 6.6 06/23/2023   ALBUMIN 4.1 06/03/2022   BILITOT 0.2 06/23/2023   ALKPHOS 52 06/03/2022   AST 17 06/23/2023   ALT 16 06/23/2023   ANIONGAP 7 03/26/2019   Last thyroid functions Lab Results  Component Value Date   TSH 3.94 06/23/2023      The ASCVD Risk score (Arnett DK, et al., 2019) failed to calculate for the following reasons:   The 2019 ASCVD risk score is only valid for ages 47 to 75    Assessment & Plan:   Problem List Items Addressed This Visit       Unprioritized   Essential hypertension - Primary   Relevant Medications   amLODipine (NORVASC) 5 MG tablet   Other Visit Diagnoses     Ankle edema, bilateral         Bilateral ankle edema probably related to amlodipine largely.  We reduced him from 10 to 5 mg and blood pressures have remained stable and edema has essentially resolved.  -We wrote prescription for amlodipine 5 mg daily #90 with 3 refills -Continue to  watch sodium intake -Follow-up for any recurrent edema or other problems  No follow-ups on file.    Evelena Peat, MD

## 2023-07-13 ENCOUNTER — Ambulatory Visit (HOSPITAL_COMMUNITY): Payer: Medicare Other | Admitting: Psychiatry

## 2023-07-24 ENCOUNTER — Other Ambulatory Visit: Payer: Self-pay | Admitting: Family Medicine

## 2023-08-09 DIAGNOSIS — H43813 Vitreous degeneration, bilateral: Secondary | ICD-10-CM | POA: Diagnosis not present

## 2023-08-09 DIAGNOSIS — H2513 Age-related nuclear cataract, bilateral: Secondary | ICD-10-CM | POA: Diagnosis not present

## 2023-08-09 DIAGNOSIS — H524 Presbyopia: Secondary | ICD-10-CM | POA: Diagnosis not present

## 2023-08-16 ENCOUNTER — Other Ambulatory Visit: Payer: Self-pay | Admitting: Family Medicine

## 2023-09-11 ENCOUNTER — Other Ambulatory Visit (HOSPITAL_COMMUNITY): Payer: Self-pay | Admitting: Psychiatry

## 2023-09-11 DIAGNOSIS — F332 Major depressive disorder, recurrent severe without psychotic features: Secondary | ICD-10-CM

## 2023-09-11 DIAGNOSIS — F419 Anxiety disorder, unspecified: Secondary | ICD-10-CM

## 2023-09-15 ENCOUNTER — Other Ambulatory Visit (HOSPITAL_COMMUNITY): Payer: Self-pay | Admitting: Psychiatry

## 2023-09-15 DIAGNOSIS — F332 Major depressive disorder, recurrent severe without psychotic features: Secondary | ICD-10-CM

## 2023-09-15 DIAGNOSIS — F419 Anxiety disorder, unspecified: Secondary | ICD-10-CM

## 2023-09-19 ENCOUNTER — Encounter (HOSPITAL_COMMUNITY): Payer: Self-pay | Admitting: Psychiatry

## 2023-09-19 ENCOUNTER — Telehealth (HOSPITAL_BASED_OUTPATIENT_CLINIC_OR_DEPARTMENT_OTHER): Payer: Medicare Other | Admitting: Psychiatry

## 2023-09-19 VITALS — Wt 138.0 lb

## 2023-09-19 DIAGNOSIS — F419 Anxiety disorder, unspecified: Secondary | ICD-10-CM

## 2023-09-19 DIAGNOSIS — F332 Major depressive disorder, recurrent severe without psychotic features: Secondary | ICD-10-CM

## 2023-09-19 MED ORDER — ESCITALOPRAM OXALATE 20 MG PO TABS
ORAL_TABLET | ORAL | 0 refills | Status: DC
Start: 2023-09-19 — End: 2023-12-06

## 2023-09-19 MED ORDER — LORAZEPAM 0.5 MG PO TABS
ORAL_TABLET | ORAL | 0 refills | Status: DC
Start: 2023-09-19 — End: 2023-10-17

## 2023-09-19 MED ORDER — ARIPIPRAZOLE 2 MG PO TABS
2.0000 mg | ORAL_TABLET | Freq: Every day | ORAL | 0 refills | Status: DC
Start: 2023-09-19 — End: 2023-11-23

## 2023-09-19 NOTE — Progress Notes (Signed)
[facility] MD Virtual Progress Note   Patient Location: Home Provider Location: Home Office  I connect with patient by video and verified that I am speaking with correct person by using two identifiers. I discussed the limitations of evaluation and management by telemedicine and the availability of in person appointments. I also discussed with the patient that there may be a patient responsible charge related to this service. The patient expressed understanding and agreed to proceed.  [name] [id number] 87 y.o.  09/19/2023 10:16 AM  History of Present Illness:  Patient is evaluated by video session.  He is taking his medication and he reported that his mood is stable.  He has taken Ativan lately to help his anxiety which works.  He reported there are days when he sometimes feels sad but denies any crying spells or any feeling of hopelessness.  He sleeps good.  He played golf when weather is good and sometime he feel sad or depressed he tried to watch television.  He had a good support from his wife.  He reported in the beginning Abilify had helped a lot of energy and he was up in the morning and motivated but now he feel his energy is not as good.  However he like to keep the current dose for now.  He has no tremors, shakes or any EPS.  Denies any crying spells or any feeling of hopelessness or worthlessness.  His weight is unchanged from the past.  He is taking Lexapro, Abilify and Ativan as needed.  Past Psychiatric History: H/O depression and anxiety. Had a good response with TMS and Lexapro. Tried Celexa, Pristiq, Remeron, Cymbalta and Xanax but limited outcome. Klonopin caused black stool and Buspar made groggy.  No h/o suicidal attempt, mania, psychosis or self abusive behavior.  Saw Dr. Dellia Cloud in the past for therapy.    He had genetic testing and the results are in the chart.    Outpatient Encounter Medications as of 09/19/2023  Medication Sig   acetaminophen  (TYLENOL) 500 MG tablet Take 1,000 mg by mouth every 6 (six) hours as needed for moderate pain or headache.   amLODipine (NORVASC) 5 MG tablet Take 1 tablet (5 mg total) by mouth daily.   ARIPiprazole (ABILIFY) 2 MG tablet Take 1 tablet (2 mg total) by mouth daily.   aspirin EC 81 MG tablet Take 1 tablet (81 mg total) by mouth daily. Swallow whole.   atorvastatin (LIPITOR) 10 MG tablet Take 1 tablet (10 mg total) by mouth daily.   clobetasol cream (TEMOVATE) 0.05 % Apply 1 application  topically 2 (two) times daily.   escitalopram (LEXAPRO) 20 MG tablet TAKE 1 AND 1/2 TABLETS BY MOUTH DAILY   finasteride (PROSCAR) 5 MG tablet Take 5 mg by mouth daily.   hydrocortisone 2.5 % cream Apply topically 2 (two) times daily as needed. (Patient taking differently: Apply 1 application  topically 2 (two) times daily as needed (rash).)   LORazepam (ATIVAN) 0.5 MG tablet Take 1 tablet daily as needed for severe panic attacks.   omeprazole (PRILOSEC) 40 MG capsule TAKE 1 CAPSULE BY MOUTH DAILY   valsartan (DIOVAN) 160 MG tablet TAKE 1 TABLET(160 MG) BY MOUTH DAILY   No facility-administered encounter medications on file as of 09/19/2023.    Recent Results (from the past 2160 hour(s))  Brain Natriuretic Peptide     Status: None   Collection Time: 06/23/23  4:02 PM  Result Value Ref Range   Brain Natriuretic Peptide  29 <100 pg/mL    Comment: . BNP levels increase with age in the general population with the highest values seen in individuals greater than 23 years of age. Reference: J. Am. Ladon Applebaum. Cardiol. 2002; 66:440-347. Marland Kitchen   Microalbumin / creatinine urine ratio     Status: None   Collection Time: 06/23/23  4:02 PM  Result Value Ref Range   Creatinine, Urine 93 20 - 320 mg/dL   Microalb, Ur 0.7 mg/dL    Comment: Reference Range Not established    Microalb Creat Ratio 8 <30 mg/g creat    Comment: . The ADA defines abnormalities in albumin excretion as follows: Marland Kitchen Albuminuria Category         Result (mg/g creatinine) . Normal to Mildly increased   <30 Moderately increased         30-299  Severely increased           > OR = 300 . The ADA recommends that at least two of three specimens collected within a 3-6 month period be abnormal before considering a patient to be within a diagnostic category.   TSH     Status: None   Collection Time: 06/23/23  4:02 PM  Result Value Ref Range   TSH 3.94 0.40 - 4.50 mIU/L  CMP     Status: None   Collection Time: 06/23/23  4:02 PM  Result Value Ref Range   Glucose, Bld 87 65 - 99 mg/dL    Comment: .            Fasting reference interval .    BUN 18 7 - 25 mg/dL   Creat 4.25 9.56 - 3.87 mg/dL   BUN/Creatinine Ratio SEE NOTE: 6 - 22 (calc)    Comment:    Not Reported: BUN and Creatinine are within    reference range. .    Sodium 136 135 - 146 mmol/L   Potassium 4.6 3.5 - 5.3 mmol/L   Chloride 103 98 - 110 mmol/L   CO2 25 20 - 32 mmol/L   Calcium 8.9 8.6 - 10.3 mg/dL   Total Protein 6.6 6.1 - 8.1 g/dL   Albumin 4.0 3.6 - 5.1 g/dL   Globulin 2.6 1.9 - 3.7 g/dL (calc)   AG Ratio 1.5 1.0 - 2.5 (calc)   Total Bilirubin 0.2 0.2 - 1.2 mg/dL   Alkaline phosphatase (APISO) 62 35 - 144 U/L   AST 17 10 - 35 U/L   ALT 16 9 - 46 U/L     Psychiatric Specialty Exam: Physical Exam  Review of Systems  Weight 138 lb (62.6 kg).There is no height or weight on file to calculate BMI.  General Appearance: Casual  Eye Contact:  Good  Speech:  Clear and Coherent and Normal Rate  Volume:  Normal  Mood:  Euthymic  Affect:  Appropriate  Thought Process:  Goal Directed  Orientation:  Full (Time, Place, and Person)  Thought Content:  WDL  Suicidal Thoughts:  No  Homicidal Thoughts:  No  Memory:  Immediate;   Good Recent;   Good Remote;   Good  Judgement:  Good  Insight:  Good  Psychomotor Activity:  Normal  Concentration:  Concentration: Good and Attention Span: Good  Recall:  Good  Fund of Knowledge:  Good  Language:  Good   Akathisia:  No  Handed:  Right  AIMS (if indicated):     Assets:  Communication Skills Desire for Improvement Housing Social Support Transportation  ADL's:  Intact  Cognition:  WNL  Sleep:  ok     Assessment/Plan: Major depressive disorder, recurrent, severe without psychotic features (HCC) - Plan: ARIPiprazole (ABILIFY) 2 MG tablet, escitalopram (LEXAPRO) 20 MG tablet  Anxiety - Plan: ARIPiprazole (ABILIFY) 2 MG tablet, escitalopram (LEXAPRO) 20 MG tablet, LORazepam (ATIVAN) 0.5 MG tablet  Patient is stable on current dose of Abilify.  He noticed in the beginning he had more energy with Abilify but now he is not as active or motivated but is still playing golf and better is good.  He does not want to increase the Abilify at this time since he feels symptoms are stable.  Discussed medication side effects and benefits.  He has no tremors, shakes, EPS or dizziness.  Continue Lexapro 30 mg daily, Abilify 2 mg daily and Ativan as needed for severe anxiety.  He is to get 20 tablet which last more than 2 months.  We will follow-up in 3 months.  Patient excited as his son is coming from [place] for upcoming holidays.  Recommend to call us back if is any question or any concern.   Follow Up Instructions:     I discussed the assessment and treatment plan with the patient. The patient was provided an opportunity to ask questions and all were answered. The patient agreed with the plan and demonstrated an understanding of the instructions.   The patient was advised to call back or seek an in-person evaluation if the symptoms worsen or if the condition fails to improve as anticipated.    Collaboration of Care: Other provider involved in patient's care AEB notes are available in epic to review  Patient/Guardian was advised Release of Information must be obtained prior to any record release in order to collaborate their care with an outside provider. Patient/Guardian was advised if they have  not already done so to contact the registration department to sign all necessary forms in order for [name] to release information regarding their care.   Consent: Patient/Guardian gives verbal consent for treatment and assignment of benefits for services provided during this visit. Patient/Guardian expressed understanding and agreed to proceed.     I provided 19 minutes of non face to face time during this encounter.  Note: This document was prepared by [employer] voice dictation technology and any errors that results from this process are unintentional.    Cleotis Nipper, MD 09/19/2023

## 2023-10-02 DIAGNOSIS — N281 Cyst of kidney, acquired: Secondary | ICD-10-CM | POA: Diagnosis not present

## 2023-10-02 DIAGNOSIS — R338 Other retention of urine: Secondary | ICD-10-CM | POA: Diagnosis not present

## 2023-10-12 DIAGNOSIS — D509 Iron deficiency anemia, unspecified: Secondary | ICD-10-CM | POA: Diagnosis not present

## 2023-10-17 ENCOUNTER — Telehealth (HOSPITAL_COMMUNITY): Payer: Self-pay | Admitting: *Deleted

## 2023-10-17 DIAGNOSIS — F419 Anxiety disorder, unspecified: Secondary | ICD-10-CM

## 2023-10-17 MED ORDER — LORAZEPAM 0.5 MG PO TABS
ORAL_TABLET | ORAL | 0 refills | Status: DC
Start: 2023-10-17 — End: 2023-11-07

## 2023-10-17 NOTE — Telephone Encounter (Signed)
Please call the medication to his pharmacy. Thanks

## 2023-10-17 NOTE — Telephone Encounter (Signed)
Pt called requesting refill of lorazepam 0.5 mg, last e-scribed for #20 on 09/19/23 which was pt's last appointment. Pt has a f/u scheduled for 12/19/23. Please review and advise.

## 2023-10-18 NOTE — Telephone Encounter (Signed)
Was called on 10/18/23.

## 2023-10-25 ENCOUNTER — Other Ambulatory Visit: Payer: Self-pay | Admitting: Family Medicine

## 2023-10-29 ENCOUNTER — Other Ambulatory Visit: Payer: Self-pay | Admitting: Family Medicine

## 2023-11-06 ENCOUNTER — Ambulatory Visit (HOSPITAL_COMMUNITY): Payer: Medicare Other | Attending: Cardiology

## 2023-11-06 ENCOUNTER — Other Ambulatory Visit: Payer: Medicare Other

## 2023-11-06 ENCOUNTER — Ambulatory Visit (HOSPITAL_COMMUNITY): Admission: RE | Admit: 2023-11-06 | Payer: Medicare Other | Source: Ambulatory Visit

## 2023-11-06 DIAGNOSIS — I351 Nonrheumatic aortic (valve) insufficiency: Secondary | ICD-10-CM | POA: Insufficient documentation

## 2023-11-06 LAB — ECHOCARDIOGRAM COMPLETE
AR max vel: 2.35 cm2
AV Area VTI: 2.45 cm2
AV Area mean vel: 2.26 cm2
AV Mean grad: 13 mm[Hg]
AV Peak grad: 17.8 mm[Hg]
Ao pk vel: 2.11 m/s
Area-P 1/2: 3.24 cm2
MV M vel: 5.49 m/s
MV Peak grad: 120.6 mm[Hg]
P 1/2 time: 444 ms
S' Lateral: 2.4 cm

## 2023-11-07 ENCOUNTER — Telehealth (HOSPITAL_COMMUNITY): Payer: Self-pay | Admitting: *Deleted

## 2023-11-07 DIAGNOSIS — F419 Anxiety disorder, unspecified: Secondary | ICD-10-CM

## 2023-11-07 MED ORDER — LORAZEPAM 0.5 MG PO TABS: 0.5000 mg | ORAL_TABLET | Freq: Two times a day (BID) | ORAL | 0 refills | Status: DC | PRN

## 2023-11-07 NOTE — Telephone Encounter (Signed)
Prescription sent to [facility]

## 2023-11-07 NOTE — Telephone Encounter (Signed)
 Pt called requesting an "expatiated" refill of the Ativan 0.5 mg every day. Pt has an upcoming appointment on 12/19/23. Last script sent for #20 on 10/17/24. Please review and advise.

## 2023-11-10 NOTE — Progress Notes (Signed)
 Stable ascending aortic dilatation at 4.0 to 4.1 cm.

## 2023-11-13 ENCOUNTER — Encounter (HOSPITAL_COMMUNITY): Payer: Medicare Other

## 2023-11-15 ENCOUNTER — Ambulatory Visit: Payer: Self-pay | Admitting: Cardiology

## 2023-11-19 NOTE — Progress Notes (Signed)
Please let patient know that echo shows normal heart function and the aortic dilatation is very minimal and may not need follow up studies at least for 2-3 years. I am seeing him in March I think

## 2023-11-22 ENCOUNTER — Encounter: Payer: Self-pay | Admitting: Cardiology

## 2023-11-22 ENCOUNTER — Ambulatory Visit (HOSPITAL_COMMUNITY)
Admission: RE | Admit: 2023-11-22 | Discharge: 2023-11-22 | Disposition: A | Discharge status: Home / Self Care | Payer: Medicare Other | Source: Ambulatory Visit | Attending: Cardiology | Admitting: Cardiology

## 2023-11-22 DIAGNOSIS — I6523 Occlusion and stenosis of bilateral carotid arteries: Secondary | ICD-10-CM | POA: Insufficient documentation

## 2023-11-22 NOTE — Progress Notes (Signed)
Very mild carotid disease no further evaluation needed

## 2023-11-23 ENCOUNTER — Telehealth (HOSPITAL_COMMUNITY): Payer: Self-pay

## 2023-11-23 ENCOUNTER — Other Ambulatory Visit (HOSPITAL_COMMUNITY): Payer: Self-pay

## 2023-11-23 MED ORDER — ARIPIPRAZOLE 5 MG PO TABS: 5.0000 mg | ORAL_TABLET | Freq: Every day | ORAL | 0 refills | Status: DC

## 2023-11-23 NOTE — Telephone Encounter (Signed)
I called patient and advised him of what Dr. Lolly Mustache said, patient voiced his understanding

## 2023-11-23 NOTE — Telephone Encounter (Signed)
Patient called this morning to see if you could increase the Abilify to 5 mg, he is currently on 2 mg and is not doing well. Patient has a f/u with you on 2/5. Please review and advise, thank you

## 2023-11-23 NOTE — Telephone Encounter (Signed)
He can try Abilify 5 mg but monitor closely for side effects.  Watch for the tremors and shakes and if that happened then he need to go back to half pill.

## 2023-12-06 ENCOUNTER — Telehealth (HOSPITAL_BASED_OUTPATIENT_CLINIC_OR_DEPARTMENT_OTHER): Payer: Medicare Other | Admitting: Psychiatry

## 2023-12-06 ENCOUNTER — Encounter (HOSPITAL_COMMUNITY): Payer: Self-pay | Admitting: Psychiatry

## 2023-12-06 VITALS — Wt 138.0 lb

## 2023-12-06 DIAGNOSIS — F332 Major depressive disorder, recurrent severe without psychotic features: Secondary | ICD-10-CM | POA: Diagnosis not present

## 2023-12-06 DIAGNOSIS — F419 Anxiety disorder, unspecified: Secondary | ICD-10-CM

## 2023-12-06 MED ORDER — LORAZEPAM 0.5 MG PO TABS: 0.5000 mg | ORAL_TABLET | Freq: Two times a day (BID) | ORAL | 0 refills | Status: DC | PRN

## 2023-12-06 MED ORDER — ESCITALOPRAM OXALATE 20 MG PO TABS: ORAL_TABLET | ORAL | 0 refills | Status: DC

## 2023-12-06 MED ORDER — HYDROXYZINE HCL 10 MG PO TABS: 10.0000 mg | ORAL_TABLET | Freq: Three times a day (TID) | ORAL | 0 refills | Status: DC | PRN

## 2023-12-06 NOTE — Progress Notes (Signed)
 [facility] MD Virtual Progress Note   Patient Location: Home Provider Location: Home Office  I connect with patient by video and verified that I am speaking with correct person by using two identifiers. I discussed the limitations of evaluation and management by telemedicine and the availability of in person appointments. I also discussed with the patient that there may be a patient responsible charge related to this service. The patient expressed understanding and agreed to proceed.  [name] [id number] 88 y.o.  12/06/2023 10:55 AM  History of Present Illness:  Patient is evaluated by video session.  He had called few times requesting Ativan  and reported his anxiety is increased.  We had increased Abilify  from 2 mg to 5 mg which she started more than a week ago.  He noticed some improvement but before he was feeling sad depressed unmotivated to do things.  He reported mornings are difficult and he has to take some time to tablets of Ativan .  He reported Christmas was okay.  His son came on Thanksgiving but him and his wife stays home on the Christmas.  He is sleeping okay.  He admitted not able to play golf 2 to cold weather but watch TV in the evening.  He denies any hallucination, paranoia, irritability, mania, anger.  His appetite is okay.  His stomach is much better and weight is unchanged from the past.  He is taking Lexapro , Abilify  and Ativan .  Denies any aggression, violence.  Past Psychiatric History: H/O depression and anxiety. Had a good response with TMS and Lexapro . Tried Celexa , Pristiq , Remeron , Cymbalta  and Xanax  but limited outcome. Klonopin  caused black stool and Buspar  made groggy.  No h/o suicidal attempt, mania, psychosis or self abusive behavior.  Saw Dr. Gutterman in the past for therapy.    He had genetic testing and the results are in the chart.    Outpatient Encounter Medications as of 12/06/2023  Medication Sig   acetaminophen  (TYLENOL ) 500 MG  tablet Take 1,000 mg by mouth every 6 (six) hours as needed for moderate pain or headache.   amLODipine  (NORVASC ) 5 MG tablet Take 1 tablet (5 mg total) by mouth daily.   ARIPiprazole  (ABILIFY ) 5 MG tablet Take 1 tablet (5 mg total) by mouth daily.   aspirin  EC 81 MG tablet Take 1 tablet (81 mg total) by mouth daily. Swallow whole.   atorvastatin  (LIPITOR) 10 MG tablet Take 1 tablet (10 mg total) by mouth daily.   clobetasol cream (TEMOVATE) 0.05 % Apply 1 application  topically 2 (two) times daily.   escitalopram  (LEXAPRO ) 20 MG tablet TAKE 1 AND 1/2 TABLETS BY MOUTH DAILY   finasteride  (PROSCAR ) 5 MG tablet Take 5 mg by mouth daily.   hydrocortisone  2.5 % cream Apply topically 2 (two) times daily as needed. (Patient taking differently: Apply 1 application  topically 2 (two) times daily as needed (rash).)   LORazepam  (ATIVAN ) 0.5 MG tablet Take 1 tablet (0.5 mg total) by mouth 2 (two) times daily as needed for anxiety.   omeprazole  (PRILOSEC) 40 MG capsule TAKE 1 CAPSULE BY MOUTH DAILY   valsartan  (DIOVAN ) 160 MG tablet TAKE 1 TABLET(160 MG) BY MOUTH DAILY   No facility-administered encounter medications on file as of 12/06/2023.    Recent Results (from the past 2160 hours)  ECHOCARDIOGRAM COMPLETE     Status: None   Collection Time: 11/06/23 12:19 PM  Result Value Ref Range   Est EF 55 - 60%    P 1/2  time 444 msec   AV Area VTI 2.45 cm2   Ao pk vel 2.11 m/s   AR max vel 2.35 cm2   AV Peak grad 17.8 mmHg   AV Area mean vel 2.26 cm2   AV Mean grad 13.0 mmHg   S' Lateral 2.40 cm   MV M vel 5.49 m/s   MV Peak grad 120.6 mmHg   Area-P 1/2 3.24 cm2     Psychiatric Specialty Exam: Physical Exam  Review of Systems  There were no vitals taken for this visit.There is no height or weight on file to calculate BMI.  General Appearance: Casual  Eye Contact:  Good  Speech:  Slow  Volume:  Decreased  Mood:  Anxious  Affect:  Congruent  Thought Process:  Descriptions of Associations:  Intact  Orientation:  Full (Time, Place, and Person)  Thought Content:  Rumination  Suicidal Thoughts:  No  Homicidal Thoughts:  No  Memory:  Immediate;   Good Recent;   Good Remote;   Good  Judgement:  Intact  Insight:  Present  Psychomotor Activity:  Decreased  Concentration:  Concentration: Good and Attention Span: Good  Recall:  Good  Fund of Knowledge:  Good  Language:  Good  Akathisia:  No  Handed:  Right  AIMS (if indicated):     Assets:  Communication Skills Desire for Improvement Housing Resilience Social Support Transportation  ADL's:  Intact  Cognition:  WNL  Sleep:  ok     Assessment/Plan: Major depressive disorder, recurrent, severe without psychotic features (HCC) - Plan: escitalopram  (LEXAPRO ) 20 MG tablet, hydrOXYzine  (ATARAX ) 10 MG tablet  Anxiety - Plan: LORazepam  (ATIVAN ) 0.5 MG tablet, escitalopram  (LEXAPRO ) 20 MG tablet, hydrOXYzine  (ATARAX ) 10 MG tablet  I reviewed current medication.  He is taking Ativan  sometime 2 In the morning to keep his anxiety under control.  So far no major concern including dizziness, nausea, vomiting.  Patient asked about going back to Klonopin  but he was reminded that he was complaining of black stools with the Klonopin  and it was discontinued.  He also had grogginess with the BuSpar .  I recommend he can trial low-dose hydroxyzine  to help with anxiety which he can take up to 2 times a day.  In the meantime we will keep the Ativan  0.5 mg up to 2 times a day but due to the concern of dependency he will consider hydroxyzine  10 mg.  I also encouraged to give more time with Abilify  since dose was recently increased and did not have on at the time to see the response.  We talked about possible underlying seasonal affective disorder due to call catheter as not able to go outside to play golf and other activities.  Patient is hoping once weather gets change he will consider going out more frequently and play golf.  He is driving and doing  errands with his wife.  Recommended to call us  back with any question or any concern.  Discussed medication side effects specially hydroxyzine  can cause dizziness so he need to watch carefully.  He has no major concern from Abilify .  Will follow-up in 4 to 6 weeks.   Follow Up Instructions:     I discussed the assessment and treatment plan with the patient. The patient was provided an opportunity to ask questions and all were answered. The patient agreed with the plan and demonstrated an understanding of the instructions.   The patient was advised to call back or seek an in-person evaluation if the  symptoms worsen or if the condition fails to improve as anticipated.    Collaboration of Care: Other provider involved in patient's care AEB notes are available in epic to review  Patient/Guardian was advised Release of Information must be obtained prior to any record release in order to collaborate their care with an outside provider. Patient/Guardian was advised if they have not already done so to contact the registration department to sign all necessary forms in order for us  to release information regarding their care.   Consent: Patient/Guardian gives verbal consent for treatment and assignment of benefits for services provided during this visit. Patient/Guardian expressed understanding and agreed to proceed.     I provided 25 minutes of non face to face time during this encounter.  Note: This document was prepared by [employer] voice dictation technology and any errors that results from this process are unintentional.    Leni ONEIDA Client, MD 12/06/2023

## 2023-12-19 ENCOUNTER — Telehealth (HOSPITAL_COMMUNITY): Payer: Medicare Other | Admitting: Psychiatry

## 2023-12-25 ENCOUNTER — Telehealth (HOSPITAL_COMMUNITY): Payer: Self-pay | Admitting: *Deleted

## 2023-12-25 NOTE — Telephone Encounter (Signed)
 Pt called to ask if you would speak with his son, who is a Therapist, sports. The son is listed as an emergency contact however I do not see an ROI in the chart.

## 2023-12-25 NOTE — Telephone Encounter (Signed)
 We need his written consent to communicate with his son.  Please reach out to his son once we have the consent.

## 2023-12-25 NOTE — Telephone Encounter (Signed)
 Pt says he will come to the office to sign ROI for son.

## 2024-01-02 ENCOUNTER — Encounter: Payer: Self-pay | Admitting: Cardiology

## 2024-01-02 ENCOUNTER — Telehealth (HOSPITAL_COMMUNITY): Payer: Self-pay

## 2024-01-02 ENCOUNTER — Ambulatory Visit: Payer: Medicare Other | Attending: Cardiology | Admitting: Cardiology

## 2024-01-02 VITALS — BP 120/62 | HR 70 | Resp 16 | Ht 67.0 in | Wt 143.8 lb

## 2024-01-02 DIAGNOSIS — E7800 Pure hypercholesterolemia, unspecified: Secondary | ICD-10-CM | POA: Diagnosis not present

## 2024-01-02 DIAGNOSIS — I359 Nonrheumatic aortic valve disorder, unspecified: Secondary | ICD-10-CM

## 2024-01-02 DIAGNOSIS — I10 Essential (primary) hypertension: Secondary | ICD-10-CM | POA: Diagnosis not present

## 2024-01-02 MED ORDER — ATORVASTATIN CALCIUM 10 MG PO TABS: 10.0000 mg | ORAL_TABLET | Freq: Every day | ORAL | 3 refills | Status: DC

## 2024-01-02 NOTE — Telephone Encounter (Signed)
 Spoke to his son Dr. Rudean Hitt who is also psychiatrist.  He mention that patient is not doing better with Abilify.  His depressive symptoms are more intense.  He is sometime does not leave the house and has no motivation.  He feels tired.  He did talk about hypomanic like symptoms but no mania.  His son has a suggestion to try Vraylar or Wellbutrin instead of Abilify.  I also discussed other possibility is to continue TMS along with changes in their pharmacology.  Other options are Trintellix.  Patient has appointment coming up this Thursday virtual but we will switch to in person visit.  Will consider Vraylar instead of Abilify.

## 2024-01-02 NOTE — Progress Notes (Signed)
 Cardiology Office Note:  .   Date:  01/02/2024  ID:  [name], DOB January 16, 1936, MRN [id number] PCP: Jack Covey, MD  [facility] Providers Cardiologist:  None   History of Present Illness: .   [name] is a 88 y.o. male with History of hypertension, hyperlipidemia, prediabetes, mild sleep apnea (not on CPAP), GERD, mild chronic hyponatremia and  aortic regurgitation presents for annual visit.  He remains asymptomatic.     Labs   Lab Results  Component Value Date   CHOL 130 06/03/2022   HDL 57.60 06/03/2022   LDLCALC 63 06/03/2022   TRIG 48.0 06/03/2022   CHOLHDL 2 06/03/2022   Lab Results  Component Value Date   NA 136 06/23/2023   K 4.6 06/23/2023   CO2 25 06/23/2023   GLUCOSE 87 06/23/2023   BUN 18 06/23/2023   CREATININE 0.93 06/23/2023   CALCIUM 8.9 06/23/2023   GFR 79.61 06/03/2022   GFRNONAA >60 03/26/2019      Latest Ref Rng & Units 06/23/2023    4:02 PM 06/03/2022    8:25 AM 10/12/2021    4:03 PM  BMP  Glucose 65 - 99 mg/dL 87  469  91   BUN 7 - 25 mg/dL 18  18  20    Creatinine 0.70 - 1.22 mg/dL 6.29  5.28  4.13   BUN/Creat Ratio 6 - 22 (calc) SEE NOTE:     Sodium 135 - 146 mmol/L 136  133  132   Potassium 3.5 - 5.3 mmol/L 4.6  4.2  4.0   Chloride 98 - 110 mmol/L 103  100  99   CO2 20 - 32 mmol/L 25  28  27    Calcium 8.6 - 10.3 mg/dL 8.9  8.9  8.9       Latest Ref Rng & Units 02/17/2021   11:02 AM 12/12/2019    9:33 AM 09/25/2019   11:02 AM  CBC  WBC 4.0 - 10.5 K/uL 5.8  5.5  6.2   Hemoglobin 13.0 - 17.0 g/dL 24.4  01.0  27.2   Hematocrit 39.0 - 52.0 % 39.6  40.9  38.7   Platelets 150.0 - 400.0 K/uL 213.0  197.0  255    Lab Results  Component Value Date   HGBA1C 6.1 06/03/2022    Lab Results  Component Value Date   TSH 3.94 06/23/2023    Review of Systems  Cardiovascular:  Negative for chest pain, dyspnea on exertion and leg swelling.   Physical Exam:   VS:  BP 120/62 (BP Location: Left Arm, Patient Position: Sitting,  Cuff Size: Normal)   Pulse 70   Resp 16   Ht 5\' 7"  (1.702 m)   Wt 143 lb 12.8 oz (65.2 kg)   SpO2 96%   BMI 22.52 kg/m    Wt Readings from Last 3 Encounters:  01/02/24 143 lb 12.8 oz (65.2 kg)  06/30/23 138 lb 9.6 oz (62.9 kg)  06/23/23 139 lb 4.8 oz (63.2 kg)     Physical Exam Neck:     Vascular: No carotid bruit or JVD.  Cardiovascular:     Rate and Rhythm: Normal rate and regular rhythm.     Pulses: Intact distal pulses.     Heart sounds: Murmur heard.     Early systolic murmur is present with a grade of 2/6 at the upper right sternal border.     No gallop.  Pulmonary:     Effort: Pulmonary effort is normal.  Breath sounds: Normal breath sounds.  Abdominal:     General: Bowel sounds are normal.     Palpations: Abdomen is soft.  Musculoskeletal:     Right lower leg: No edema.     Left lower leg: No edema.   Studies Reviewed: .    Carotid artery duplex 11/22/2023: Right ICA normal, left ICA 1 to 39% stenosis with mild heterogenous plaque. Bilateral antegrade vertebral artery flow. Compared to 05/2023 study, left ICA stenosis of 50 to 69% not present.  Follow-up study if clinically indicated.  ECHOCARDIOGRAM COMPLETE 11/06/2023  1. Left ventricular ejection fraction, by estimation, is 55 to 60%. The left ventricle has normal function. The left ventricle has no regional wall motion abnormalities. There is mild asymmetric left ventricular hypertrophy of the basal-septal segment. Left ventricular diastolic parameters are consistent with Grade I diastolic dysfunction (impaired relaxation). 2. Right ventricular systolic function is normal. The right ventricular size is normal. There is normal pulmonary artery systolic pressure. The estimated right ventricular systolic pressure is 25.5 mmHg. 3. The mitral valve is normal in structure. Trivial mitral valve regurgitation. No evidence of mitral stenosis. 4. The aortic valve is tricuspid. There is moderate calcification of the  aortic valve. Aortic valve regurgitation is mild. Mild aortic valve stenosis. Aortic valve mean gradient measures 13.0 mmHg. 5. Aortic dilatation noted. There is mild dilatation of the ascending aorta, measuring 41 mm.  EKG:    EKG Interpretation Date/Time:  Tuesday January 02 2024 09:41:15 EST Ventricular Rate:  69 PR Interval:  138 QRS Duration:  96 QT Interval:  398 QTC Calculation: 426 R Axis:   -26  Text Interpretation: EKG 01/02/2024: Normal sinus rhythm at rate of 69 bpm, leftward axis, poor R progression, cannot exclude anteroseptal infarct old.  No evidence of ischemia.  No significant change from 02/20/2019 Confirmed by Delrae Rend [phone]) on 01/02/2024 9:52:09 AM     Medications and allergies    Allergies  Allergen Reactions  . Dexlansoprazole     Other reaction(s): Unknown     Current Outpatient Medications:  .  acetaminophen (TYLENOL) 500 MG tablet, Take 1,000 mg by mouth every 6 (six) hours as needed for moderate pain or headache., Disp: , Rfl:  .  amLODipine (NORVASC) 5 MG tablet, Take 1 tablet (5 mg total) by mouth daily., Disp: 90 tablet, Rfl: 3 .  ARIPiprazole (ABILIFY) 5 MG tablet, Take 1 tablet (5 mg total) by mouth daily., Disp: 90 tablet, Rfl: 0 .  aspirin EC 81 MG tablet, Take 1 tablet (81 mg total) by mouth daily. Swallow whole., Disp: 90 tablet, Rfl: 3 .  atorvastatin (LIPITOR) 10 MG tablet, Take 1 tablet (10 mg total) by mouth daily., Disp: 90 tablet, Rfl: 3 .  clobetasol cream (TEMOVATE) 0.05 %, Apply 1 application  topically 2 (two) times daily., Disp: , Rfl:  .  escitalopram (LEXAPRO) 20 MG tablet, TAKE 1 AND 1/2 TABLETS BY MOUTH DAILY, Disp: 135 tablet, Rfl: 0 .  finasteride (PROSCAR) 5 MG tablet, Take 5 mg by mouth daily., Disp: , Rfl:  .  hydrocortisone 2.5 % cream, Apply topically 2 (two) times daily as needed. (Patient taking differently: Apply 1 application  topically 2 (two) times daily as needed (rash).), Disp: 30 g, Rfl: 1 .  hydrOXYzine  (ATARAX) 10 MG tablet, Take 1 tablet (10 mg total) by mouth 3 (three) times daily as needed., Disp: 40 tablet, Rfl: 0 .  LORazepam (ATIVAN) 0.5 MG tablet, Take 1 tablet (0.5 mg total) by mouth  2 (two) times daily as needed for anxiety., Disp: 60 tablet, Rfl: 0 .  omeprazole (PRILOSEC) 40 MG capsule, TAKE 1 CAPSULE BY MOUTH DAILY, Disp: 100 capsule, Rfl: 0 .  valsartan (DIOVAN) 160 MG tablet, TAKE 1 TABLET(160 MG) BY MOUTH DAILY, Disp: 90 tablet, Rfl: 0   ASSESSMENT AND PLAN: .      ICD-10-CM   1. Aortic valve disorder  I35.9 EKG 12-Lead    2. Essential hypertension  I10 EKG 12-Lead    3. Hypercholesteremia  E78.00 EKG 12-Lead     1. Aortic valve disorder (Primary) Patient has mild aortic regurgitation and mild aortic stenosis, no change in physical exam, he is on ARB for both ascending aortic root dilatation and for aortic regurgitation as well.  Continue the same. - EKG 12-Lead  2. Essential hypertension Blood pressure is very well-controlled with a combination of amlodipine 5 mg daily along with valsartan 160 mg daily, continue the same. - EKG 12-Lead  3. Hypercholesteremia Lipids are under excellent control with atorvastatin 10 mg daily.  4.  Carotid atherosclerosis Near complete resolution of carotid stenosis since initiation of statin therapy, also I do not hear any carotid bruit, he has responded well to Lipitor 10 mg daily, continue the same. - EKG 12-Lead - atorvastatin (LIPITOR) 10 MG tablet; Take 1 tablet (10 mg total) by mouth daily.  Dispense: 90 tablet; Refill: 3  Overall from cardiac standpoint he remains stable, in view of ascending aortic Root dilatation and aortic valve disorder, I will see him back in 2 years for follow-up. Signed,  Yates Decamp, MD, [facility] 01/02/2024, 9:55 AM [facility] [address] [place], [place] [address] Phone: [phone]. Fax:  [phone]

## 2024-01-02 NOTE — Telephone Encounter (Signed)
 Patients son is calling to speak with you, there is a signed ROI in the media tab for his son. His name is Dr. Octaviano Glow and he would like a call to discuss his dads medications. He number is [phone]. Please review and advise, thank you

## 2024-01-02 NOTE — Patient Instructions (Signed)
 Medication Instructions:  Your physician recommends that you continue on your current medications as directed. Please refer to the Current Medication list given to you today.  *If you need a refill on your cardiac medications before your next appointment, please call your pharmacy*  Follow-Up: At [facility], you and your health needs are our priority.  As part of our continuing mission to provide you with exceptional heart care, we have created designated Provider Care Teams.  These Care Teams include your primary Cardiologist (physician) and Advanced Practice Providers (APPs -  Physician Assistants and Nurse Practitioners) who all work together to provide you with the care you need, when you need it.  Your next appointment:   2 year(s)  The format for your next appointment:   In Person  Provider:   Yates Decamp, MD {  Other Instructions   1st Floor: - Lobby - Registration  - Pharmacy  - Lab - Cafe  2nd Floor: - PV Lab - Diagnostic Testing (echo, CT, nuclear med)  3rd Floor: - Vacant  4th Floor: - TCTS (cardiothoracic surgery) - AFib Clinic - Structural Heart Clinic - Vascular Surgery  - Vascular Ultrasound  5th Floor: - HeartCare Cardiology (general and EP) - Clinical Pharmacy for coumadin, hypertension, lipid, weight-loss medications, and med management appointments    Valet parking services will be available as well.

## 2024-01-03 ENCOUNTER — Other Ambulatory Visit: Payer: Self-pay | Admitting: Family Medicine

## 2024-01-04 ENCOUNTER — Ambulatory Visit (HOSPITAL_COMMUNITY): Admitting: Psychiatry

## 2024-01-04 ENCOUNTER — Telehealth (HOSPITAL_COMMUNITY): Payer: Medicare Other | Admitting: Psychiatry

## 2024-01-04 ENCOUNTER — Encounter (HOSPITAL_COMMUNITY): Payer: Self-pay | Admitting: Psychiatry

## 2024-01-04 ENCOUNTER — Other Ambulatory Visit: Payer: Self-pay

## 2024-01-04 VITALS — BP 114/71 | HR 74 | Ht 66.0 in | Wt 144.0 lb

## 2024-01-04 DIAGNOSIS — F419 Anxiety disorder, unspecified: Secondary | ICD-10-CM

## 2024-01-04 DIAGNOSIS — F332 Major depressive disorder, recurrent severe without psychotic features: Secondary | ICD-10-CM | POA: Diagnosis not present

## 2024-01-04 MED ORDER — ESCITALOPRAM OXALATE 20 MG PO TABS: ORAL_TABLET | ORAL | 0 refills | Status: DC

## 2024-01-04 MED ORDER — CARIPRAZINE HCL 1.5 MG PO CAPS
1.5000 mg | ORAL_CAPSULE | Freq: Every day | ORAL | Status: DC
Start: 2024-01-04 — End: 2024-02-01

## 2024-01-04 MED ORDER — LORAZEPAM 0.5 MG PO TABS: 0.5000 mg | ORAL_TABLET | Freq: Two times a day (BID) | ORAL | 0 refills | Status: DC | PRN

## 2024-01-04 NOTE — Progress Notes (Signed)
 [facility] MD/PA/NP OP Progress Note  Patient location; office Provider location; office  01/04/2024 11:05 AM [name]  MRN:  [id number]  Chief Complaint:  Chief Complaint  Patient presents with   Depression   Follow-up   HPI: Patient came today office for his follow-up appointment with his wife.  I talked to patient's son who is a psychiatrist 2 days ago who is concerned that patient is not doing very well.  His son reported that patient does not have everything during the visits and keeping symptoms to himself.  As per patient's son, he is very isolated, unmotivated to do things.  He feels all the time tired and has lost interest in doing things.  He had not leave the house in some time does not take the shower.  As per son no history of mania, psychosis but admitted sometime hypomanic like symptoms but not worsening.  Biggest concern is depression, anhedonia.  Patient told that he is taking Ativan on a regular basis to help his anxiety and sleeping too much.  His wife also reported that he does not go outside and after the breakfast to go to sleep and then after the lunch he take a nap.  Patient denies any crying spells or any feeling of aggression but admitted hopeless, worthless.  He does watch TV when wife asked him to watch but otherwise he does not take initiated to do things.  His wife has a Systems analyst who also encouraged patient to do exercise but patient not interested.  He is taking Lexapro, Abilify, Ativan.  His appetite is okay.  His weight is stable.  Patient's wife told that he used to enjoy traveling but lately and slowly it has been cut down.  Initially he cut down international traverse then traveling out of the state and now out of city.  No new medication added.  He is taking Abilify but do not report any tremors, shakes or any EPS.  Visit Diagnosis:    ICD-10-CM   1. Major depressive disorder, recurrent, severe without psychotic features (HCC)  F33.2 escitalopram (LEXAPRO)  20 MG tablet    cariprazine (VRAYLAR) 1.5 MG capsule    2. Anxiety  F41.9 escitalopram (LEXAPRO) 20 MG tablet    LORazepam (ATIVAN) 0.5 MG tablet    cariprazine (VRAYLAR) 1.5 MG capsule      Past Psychiatric History: Reviewed H/O depression and anxiety. Had a good response with TMS and Lexapro. Tried Celexa, Pristiq, Remeron, Cymbaltaand Xanax but limited outcome. Klonopin caused black stool and Buspar made groggy.  No h/o suicidal attempt, mania, psychosis or self abusive behavior.  Saw Dr. Dellia Cloud in the past for therapy.    He had genetic testing and the results are in the chart.  Abilify tried for few weeks but it was not effective.    Past Medical History:  Past Medical History:  Diagnosis Date   Anxiety    BPH (benign prostatic hypertrophy)    DDD (degenerative disc disease), lumbar 2002   Depression    Difficult intubation 02/22/2019   Anterior with limited oral opening   Essential hypertension 03/10/2019   GERD (gastroesophageal reflux disease)    History of colon polyps    History of hiatal hernia 2012   Hyperlipemia    Mild   Indwelling Foley catheter present    Iron deficiency anemia    IV iron therapy    Mild hyperlipidemia 02/20/2008   Qualifier: Diagnosis of  By: Cato Mulligan MD, Smitty Cords  Pre-diabetes    Renal cyst 2008   7.6 cm lower pole right renal cyst   Sleep apnea    slight, no CPAP used    Past Surgical History:  Procedure Laterality Date   COLONOSCOPY  08/2011   COLONOSCOPY WITH PROPOFOL N/A 06/09/2023   Procedure: COLONOSCOPY WITH PROPOFOL;  Surgeon: Jeani Hawking, MD;  Location: [facility];  Service: Gastroenterology;  Laterality: N/A;   ESOPHAGOGASTRODUODENOSCOPY (EGD) WITH PROPOFOL N/A 06/09/2023   Procedure: ESOPHAGOGASTRODUODENOSCOPY (EGD) WITH PROPOFOL;  Surgeon: Jeani Hawking, MD;  Location: [facility];  Service: Gastroenterology;  Laterality: N/A;   NASAL SEPTOPLASTY W/ TURBINOPLASTY  08/03/2012   Procedure: NASAL SEPTOPLASTY WITH TURBINATE  REDUCTION;  Surgeon: Serena Colonel, MD;  Location: [facility] OR;  Service: ENT;  Laterality: Bilateral;   POLYPECTOMY  06/09/2023   Procedure: POLYPECTOMY;  Surgeon: Jeani Hawking, MD;  Location: [facility];  Service: Gastroenterology;;   UPPER GASTROINTESTINAL ENDOSCOPY  08/2011   XI ROBOTIC ASSISTED SIMPLE PROSTATECTOMY N/A 02/22/2019   Procedure: XI ROBOTIC ASSISTED SIMPLE PROSTATECTOMY;  Surgeon: Sebastian Ache, MD;  Location: [facility];  Service: Urology;  Laterality: N/A;    Family Psychiatric History: Reviewed  Family History:  Family History  Problem Relation Age of Onset   Heart attack Father    Hypertension Father    Hypertension Sister    Hypertension Brother    Hypertension Brother    Hypertension Daughter     Social History:  Social History   Socioeconomic History   Marital status: Married    Spouse name: Not on file   Number of children: 2   Years of education: Not on file   Highest education level: Master's degree (e.g., MA, MS, MEng, MEd, MSW, MBA)  Occupational History   Not on file  Tobacco Use   Smoking status: Former    Current packs/day: 0.00    Average packs/day: 0.5 packs/day for 10.0 years (5.0 ttl pk-yrs)    Types: Cigarettes    Start date: 67    Quit date: 1969    Years since quitting: 56.2   Smokeless tobacco: Never   Tobacco comments:    rare use over 50 years   Vaping Use   Vaping status: Never Used  Substance and Sexual Activity   Alcohol use: Yes    Alcohol/week: 1.0 standard drink of alcohol    Types: 1 Shots of liquor per week    Comment: weekly   Drug use: No   Sexual activity: Never    Birth control/protection: None  Other Topics Concern   Not on file  Social History Narrative   Lives in 2 level    Will stay for now    Just moved here approx 1.5 yo   Very nice neighbors    [employer]  x 5 with a very busy holiday   One son is a Therapist, sports and nephew is a Counsellor    Son in [place] moved to [place] and he misses his grand  children    As 2 brothers in [place]       Retired from [employer], [employer]       Social Drivers of Health   Financial Resource Strain: Low Risk  (06/23/2023)   Overall Financial Resource Strain (CARDIA)    Difficulty of Paying Living Expenses: Not hard at all  Food Insecurity: No Food Insecurity (06/23/2023)   Hunger Vital Sign    Worried About Running Out of Food in the Last Year: Never true    Ran  Out of Food in the Last Year: Never true  Transportation Needs: No Transportation Needs (06/23/2023)   PRAPARE - Administrator, Civil Service (Medical): No    Lack of Transportation (Non-Medical): No  Physical Activity: Insufficiently Active (06/23/2023)   Exercise Vital Sign    Days of Exercise per Week: 3 days    Minutes of Exercise per Session: 20 min  Stress: No Stress Concern Present (06/23/2023)   [employer] of Occupational Health - Occupational Stress Questionnaire    Feeling of Stress : Not at all  Social Connections: Unknown (06/23/2023)   Social Connection and Isolation Panel [NHANES]    Frequency of Communication with Friends and Family: Three times a week    Frequency of Social Gatherings with Friends and Family: Once a week    Attends Religious Services: 1 to 4 times per year    Active Member of [employer] or Organizations: No    Attends Banker Meetings: Not on file    Marital Status: Not on file    Allergies:  Allergies  Allergen Reactions   Dexlansoprazole     Other reaction(s): Unknown    Metabolic Disorder Labs: Lab Results  Component Value Date   HGBA1C 6.1 06/03/2022   MPG 116.89 02/20/2019   No results found for: "PROLACTIN" Lab Results  Component Value Date   CHOL 130 06/03/2022   TRIG 48.0 06/03/2022   HDL 57.60 06/03/2022   CHOLHDL 2 06/03/2022   VLDL 9.6 06/03/2022   LDLCALC 63 06/03/2022   LDLCALC 112 (H) 02/17/2021   Lab Results  Component Value Date   TSH 3.94 06/23/2023   TSH 3.02 02/17/2021    Therapeutic Level  Labs: No results found for: "LITHIUM" No results found for: "VALPROATE" No results found for: "CBMZ"  Current Medications: Current Outpatient Medications  Medication Sig Dispense Refill   acetaminophen (TYLENOL) 500 MG tablet Take 1,000 mg by mouth every 6 (six) hours as needed for moderate pain or headache.     amLODipine (NORVASC) 5 MG tablet Take 1 tablet (5 mg total) by mouth daily. 90 tablet 3   ARIPiprazole (ABILIFY) 5 MG tablet Take 1 tablet (5 mg total) by mouth daily. 90 tablet 0   aspirin EC 81 MG tablet Take 1 tablet (81 mg total) by mouth daily. Swallow whole. 90 tablet 3   atorvastatin (LIPITOR) 10 MG tablet Take 1 tablet (10 mg total) by mouth daily. 90 tablet 3   clobetasol cream (TEMOVATE) 0.05 % Apply 1 application  topically 2 (two) times daily.     escitalopram (LEXAPRO) 20 MG tablet TAKE 1 AND 1/2 TABLETS BY MOUTH DAILY 135 tablet 0   finasteride (PROSCAR) 5 MG tablet Take 5 mg by mouth daily.     hydrocortisone 2.5 % cream Apply topically 2 (two) times daily as needed. (Patient taking differently: Apply 1 application  topically 2 (two) times daily as needed (rash).) 30 g 1   hydrOXYzine (ATARAX) 10 MG tablet Take 1 tablet (10 mg total) by mouth 3 (three) times daily as needed. 40 tablet 0   LORazepam (ATIVAN) 0.5 MG tablet Take 1 tablet (0.5 mg total) by mouth 2 (two) times daily as needed for anxiety. 60 tablet 0   omeprazole (PRILOSEC) 40 MG capsule TAKE 1 CAPSULE BY MOUTH DAILY 100 capsule 0   valsartan (DIOVAN) 160 MG tablet TAKE 1 TABLET(160 MG) BY MOUTH DAILY 90 tablet 0   No current facility-administered medications for this visit.  Musculoskeletal: Strength & Muscle Tone: within normal limits Gait & Station: normal Patient leans: N/A  Psychiatric Specialty Exam: Review of Systems  Blood pressure 114/71, pulse 74, height 5\' 6"  (1.676 m), weight 144 lb (65.3 kg).Body mass index is 23.24 kg/m.  General Appearance: Casual  Eye Contact:  Fair  Speech:   Slow  Volume:  Decreased  Mood:  Depressed and Hopeless  Affect:  Constricted and Depressed  Thought Process:  Descriptions of Associations: Intact  Orientation:  Full (Time, Place, and Person)  Thought Content: Rumination   Suicidal Thoughts:  No  Homicidal Thoughts:  No  Memory:  Immediate;   Good Recent;   Good Remote;   Fair  Judgement:  Fair  Insight:  Shallow  Psychomotor Activity:  Decreased  Concentration:  Concentration: Fair and Attention Span: Fair  Recall:  [employer] of Knowledge: Good  Language: Good  Akathisia:  No  Handed:  Right  AIMS (if indicated): not done  Assets:  Communication Skills Desire for Improvement Housing Social Support  ADL's:  Intact  Cognition: WNL  Sleep:   Sleeping too much   Screenings: AUDIT    Loss adjuster, chartered Office Visit from 06/23/2023 in [facility] [place] Clinical Support from 12/14/2020 in [facility] [place]  Alcohol Use Disorder Identification Test Final Score (AUDIT) 3  3      GAD-7    Flowsheet Row Office Visit from 05/17/2022 in [facility] Clinical Support from 05/21/2021 in [facility] Clinical Support from 04/27/2021 in [facility]  Total GAD-7 Score 12 1 2       PHQ2-9    Flowsheet Row [facility]/04/2024 [facility] Office Visit from 06/23/2023 in [facility] [place] Video Visit from 12/06/2022 in [facility] [place] Clinical Support from 07/08/2022 in [facility] Clinical Support from 07/01/2022 in [facility]  PHQ-2 Total Score 5 0 2 0 0  PHQ-9 Total Score 12 4 7 1 2       Flowsheet Row Admission (Discharged) from 06/09/2023 in [facility] Video Visit from 08/29/2022 in [facility]  Video Visit from 11/30/2021 in [facility]  C-SSRS RISK CATEGORY No Risk Error: Question 6 not populated No Risk        Assessment and Plan: Patient is 88 year old man with history of depression and anxiety.  Currently taking Lexapro 30 mg, Ativan 0.5 mg twice a day as needed and Abilify 5 mg daily.  PHQ screening done.  As per his family, patient not doing very well.  Patient also admitted slowly and gradually losing interest in doing things.  His son who is a psychiatrist recommend to consider Vraylar or Wellbutrin.  His son does not feel the Abilify is working and patient also has not seen a significant improvement.  I discussed in detail about his motivation which has been gradually declined.  He used to enjoy golf but lately has not done because of the cold weather.  He does not have any suicidal thoughts.  We talk about trying Vraylar 1.5 mg and discontinue Abilify to see if that helps.  Other option is Wellbutrin.  I also discussed about TMS but patient refused because it worked in the beginning but last treatment was not helpful.  Discussed sleep hygiene that not to sleep during the day and try to pick up some  hobbies to keep himself busy.  Patient will start Vraylar 1.5 mg samples.  I encouraged to call us back if is any question or any concern.  Continue Ativan and Lexapro at present dose.  Will follow-up in 4 weeks.  Collaboration of Care: Collaboration of Care: Other provider involved in patient's care AEB notes are available in epic to review  Patient/Guardian was advised Release of Information must be obtained prior to any record release in order to collaborate their care with an outside provider. Patient/Guardian was advised if they have not already done so to contact the registration department to sign all necessary forms in order for [name] to release information regarding their care.   Consent: Patient/Guardian gives verbal consent for treatment and  assignment of benefits for services provided during this visit. Patient/Guardian expressed understanding and agreed to proceed.   I provided 28 minutes face-to-face time during this encounter.  I explained treatment plan to his wife and encouraged to give [name] a call back if he has any issues.  Jack Nipper, MD 01/04/2024, 11:05 AM

## 2024-02-01 ENCOUNTER — Other Ambulatory Visit: Payer: Self-pay

## 2024-02-01 ENCOUNTER — Ambulatory Visit (HOSPITAL_COMMUNITY): Admitting: Psychiatry

## 2024-02-01 ENCOUNTER — Encounter (HOSPITAL_COMMUNITY): Payer: Self-pay | Admitting: Psychiatry

## 2024-02-01 VITALS — BP 138/72 | HR 70 | Ht 66.0 in | Wt 142.0 lb

## 2024-02-01 DIAGNOSIS — F332 Major depressive disorder, recurrent severe without psychotic features: Secondary | ICD-10-CM | POA: Diagnosis not present

## 2024-02-01 DIAGNOSIS — F419 Anxiety disorder, unspecified: Secondary | ICD-10-CM

## 2024-02-01 MED ORDER — BUPROPION HCL ER (XL) 150 MG PO TB24: 150.0000 mg | ORAL_TABLET | Freq: Every day | ORAL | 0 refills | Status: DC

## 2024-02-01 MED ORDER — LORAZEPAM 0.5 MG PO TABS
0.5000 mg | ORAL_TABLET | Freq: Two times a day (BID) | ORAL | 0 refills | Status: DC | PRN
Start: 2024-02-01 — End: 2024-02-29

## 2024-02-01 NOTE — Progress Notes (Signed)
 [facility] MD/PA/NP OP Progress Note  Patient location; office Provider location; office  02/01/2024 10:23 AM AMAURIE Ward  MRN:  [id number]  Chief Complaint:  Chief Complaint  Patient presents with   Follow-up   HPI: Patient came today for his follow-up appointment with his wife.  We started him on Vraylar but patient has not seen any improvement.  He remains very sad, depressed, unmotivated, isolated and withdrawn.  He also reported not sleeping very well for past few days.  I noticed some jitteriness which she mentioned started few days ago.  His wife is concerned because he has no interest in doing things.  She took him outside few times out of home but otherwise he stays to himself.  He denies any mania, psychosis, hallucination.  He denies any anger or any suicidal thoughts.  He lost 2 pounds since the last visit.  He is taking Ativan twice a day to help his anxiety.  He has no tremors but feels fidgety.  He is taking Vraylar in the evening.  He also taking Lexapro 30 mg daily.  He reported fair appetite.  He has also some nausea but no rash.  His wife endorsed that he does not want to travel, play golf or spend time together.  He used to watch television but lately cut down.  He used to travel to [facility] which has not done in a while.  He also not writing any poetry.  He denies drinking or using any illegal substances.  Visit Diagnosis:    ICD-10-CM   1. Major depressive disorder, recurrent, severe without psychotic features (HCC)  F33.2 buPROPion (WELLBUTRIN XL) 150 MG 24 hr tablet    2. Anxiety  F41.9 LORazepam (ATIVAN) 0.5 MG tablet    buPROPion (WELLBUTRIN XL) 150 MG 24 hr tablet       Past Psychiatric History: Reviewed H/O depression and anxiety. Tried Celexa, Pristiq, Remeron, Cymbalta, and Xanax but limited outcome. Klonopin caused black stool and Buspar made groggy.  Had TMS but not helpful.  No h/o suicidal attempt, mania, psychosis or self abusive behavior.  Saw Dr. Dellia Cloud in  the past for therapy.  He had genetic testing and the results are in the chart.  Abilify and Vraylar tried for few weeks but it was not effective.    Past Medical History:  Past Medical History:  Diagnosis Date   Anxiety    BPH (benign prostatic hypertrophy)    DDD (degenerative disc disease), lumbar 2002   Depression    Difficult intubation 02/22/2019   Anterior with limited oral opening   Essential hypertension 03/10/2019   GERD (gastroesophageal reflux disease)    History of colon polyps    History of hiatal hernia 2012   Hyperlipemia    Mild   Indwelling Foley catheter present    Iron deficiency anemia    IV iron therapy    Mild hyperlipidemia 02/20/2008   Qualifier: Diagnosis of  By: Cato Mulligan MD, Bruce     Pre-diabetes    Renal cyst 2008   7.6 cm lower pole right renal cyst   Sleep apnea    slight, no CPAP used    Past Surgical History:  Procedure Laterality Date   COLONOSCOPY  08/2011   COLONOSCOPY WITH PROPOFOL N/A 06/09/2023   Procedure: COLONOSCOPY WITH PROPOFOL;  Surgeon: Jeani Hawking, MD;  Location: [facility];  Service: Gastroenterology;  Laterality: N/A;   ESOPHAGOGASTRODUODENOSCOPY (EGD) WITH PROPOFOL N/A 06/09/2023   Procedure: ESOPHAGOGASTRODUODENOSCOPY (EGD) WITH PROPOFOL;  Surgeon: Elnoria Howard,  Luisa Hart, MD;  Location: [facility];  Service: Gastroenterology;  Laterality: N/A;   NASAL SEPTOPLASTY W/ TURBINOPLASTY  08/03/2012   Procedure: NASAL SEPTOPLASTY WITH TURBINATE REDUCTION;  Surgeon: Serena Colonel, MD;  Location: [facility] OR;  Service: ENT;  Laterality: Bilateral;   POLYPECTOMY  06/09/2023   Procedure: POLYPECTOMY;  Surgeon: Jeani Hawking, MD;  Location: [facility];  Service: Gastroenterology;;   UPPER GASTROINTESTINAL ENDOSCOPY  08/2011   XI ROBOTIC ASSISTED SIMPLE PROSTATECTOMY N/A 02/22/2019   Procedure: XI ROBOTIC ASSISTED SIMPLE PROSTATECTOMY;  Surgeon: Sebastian Ache, MD;  Location: [facility];  Service: Urology;  Laterality: N/A;    Family Psychiatric History:  Reviewed  Family History:  Family History  Problem Relation Age of Onset   Heart attack Father    Hypertension Father    Hypertension Sister    Hypertension Brother    Hypertension Brother    Hypertension Daughter     Social History:  Social History   Socioeconomic History   Marital status: Married    Spouse name: Not on file   Number of children: 2   Years of education: Not on file   Highest education level: Master's degree (e.g., MA, MS, MEng, MEd, MSW, MBA)  Occupational History   Not on file  Tobacco Use   Smoking status: Former    Current packs/day: 0.00    Average packs/day: 0.5 packs/day for 10.0 years (5.0 ttl pk-yrs)    Types: Cigarettes    Start date: 69    Quit date: 1969    Years since quitting: 56.2   Smokeless tobacco: Never   Tobacco comments:    rare use over 50 years   Vaping Use   Vaping status: Never Used  Substance and Sexual Activity   Alcohol use: Yes    Alcohol/week: 1.0 standard drink of alcohol    Types: 1 Shots of liquor per week    Comment: weekly   Drug use: No   Sexual activity: Never    Birth control/protection: None  Other Topics Concern   Not on file  Social History Narrative   Lives in 2 level    Will stay for now    Just moved here approx 1.5 yo   Very nice neighbors    [employer]  x 5 with a very busy holiday   One son is a Therapist, sports and nephew is a Counsellor    Son in [place] moved to [place] and he misses his grand children    As 2 brothers in [place]       Retired from [employer], [employer]       Social Drivers of Corporate investment banker Strain: Low Risk  (06/23/2023)   Overall Financial Resource Strain (CARDIA)    Difficulty of Paying Living Expenses: Not hard at all  Food Insecurity: No Food Insecurity (06/23/2023)   Hunger Vital Sign    Worried About Running Out of Food in the Last Year: Never true    Ran Out of Food in the Last Year: Never true  Transportation Needs: No Transportation Needs (06/23/2023)   PRAPARE -  Administrator, Civil Service (Medical): No    Lack of Transportation (Non-Medical): No  Physical Activity: Insufficiently Active (06/23/2023)   Exercise Vital Sign    Days of Exercise per Week: 3 days    Minutes of Exercise per Session: 20 min  Stress: No Stress Concern Present (06/23/2023)   [employer] of Occupational Health - Occupational Stress Questionnaire  Feeling of Stress : Not at all  Social Connections: Unknown (06/23/2023)   Social Connection and Isolation Panel [NHANES]    Frequency of Communication with Friends and Family: Three times a week    Frequency of Social Gatherings with Friends and Family: Once a week    Attends Religious Services: 1 to 4 times per year    Active Member of [employer] or Organizations: No    Attends Banker Meetings: Not on file    Marital Status: Not on file    Allergies:  Allergies  Allergen Reactions   Dexlansoprazole     Other reaction(s): Unknown    Metabolic Disorder Labs: Lab Results  Component Value Date   HGBA1C 6.1 06/03/2022   MPG 116.89 02/20/2019   No results found for: "PROLACTIN" Lab Results  Component Value Date   CHOL 130 06/03/2022   TRIG 48.0 06/03/2022   HDL 57.60 06/03/2022   CHOLHDL 2 06/03/2022   VLDL 9.6 06/03/2022   LDLCALC 63 06/03/2022   LDLCALC 112 (H) 02/17/2021   Lab Results  Component Value Date   TSH 3.94 06/23/2023   TSH 3.02 02/17/2021    Therapeutic Level Labs: No results found for: "LITHIUM" No results found for: "VALPROATE" No results found for: "CBMZ"  Current Medications: Current Outpatient Medications  Medication Sig Dispense Refill   acetaminophen (TYLENOL) 500 MG tablet Take 1,000 mg by mouth every 6 (six) hours as needed for moderate pain or headache.     amLODipine (NORVASC) 5 MG tablet Take 1 tablet (5 mg total) by mouth daily. 90 tablet 3   ARIPiprazole (ABILIFY) 5 MG tablet Take 1 tablet (5 mg total) by mouth daily. 90 tablet 0   aspirin EC 81 MG  tablet Take 1 tablet (81 mg total) by mouth daily. Swallow whole. 90 tablet 3   atorvastatin (LIPITOR) 10 MG tablet Take 1 tablet (10 mg total) by mouth daily. 90 tablet 3   cariprazine (VRAYLAR) 1.5 MG capsule Take 1 capsule (1.5 mg total) by mouth daily.     clobetasol cream (TEMOVATE) 0.05 % Apply 1 application  topically 2 (two) times daily.     finasteride (PROSCAR) 5 MG tablet Take 5 mg by mouth daily.     hydrocortisone 2.5 % cream Apply topically 2 (two) times daily as needed. (Patient taking differently: Apply 1 application  topically 2 (two) times daily as needed (rash).) 30 g 1   hydrOXYzine (ATARAX) 10 MG tablet Take 1 tablet (10 mg total) by mouth 3 (three) times daily as needed. 40 tablet 0   LORazepam (ATIVAN) 0.5 MG tablet Take 1 tablet (0.5 mg total) by mouth 2 (two) times daily as needed for anxiety. 60 tablet 0   omeprazole (PRILOSEC) 40 MG capsule TAKE 1 CAPSULE BY MOUTH DAILY 100 capsule 0   valsartan (DIOVAN) 160 MG tablet TAKE 1 TABLET(160 MG) BY MOUTH DAILY 90 tablet 0   escitalopram (LEXAPRO) 20 MG tablet TAKE 1 AND 1/2 TABLETS BY MOUTH DAILY 135 tablet 0   No current facility-administered medications for this visit.     Musculoskeletal: Strength & Muscle Tone: within normal limits Gait & Station: normal Patient leans: N/A Psychiatric Specialty Exam: Physical Exam  Review of Systems  Blood pressure 138/72, pulse 70, height 5\' 6"  (1.676 m), weight 142 lb (64.4 kg).Body mass index is 22.92 kg/m.  General Appearance: Casual  Eye Contact:  Fair  Speech:  Slow  Volume:  Decreased  Mood:  Depressed and Dysphoric  Affect:  Constricted and Depressed  Thought Process:  Goal Directed  Orientation:  Full (Time, Place, and Person)  Thought Content:  Rumination  Suicidal Thoughts:  No  Homicidal Thoughts:  No  Memory:  Immediate;   Good Recent;   Fair Remote;   Fair  Judgement:  Fair  Insight:  Shallow  Psychomotor Activity:  Increased and Restlessness   Concentration:  Concentration: Fair and Attention Span: Fair  Recall:  [employer] of Knowledge:  Fair  Language:  Good  Akathisia:  No  Handed:  Right  AIMS (if indicated):     Assets:  Communication Skills Desire for Improvement Housing Social Support  ADL's:  Intact  Cognition:  WNL  Sleep:   napping during daytime.      Screenings: AUDIT    Flowsheet Row Office Visit from 06/23/2023 in [facility] [place] Clinical Support from 12/14/2020 in [facility] [place]  Alcohol Use Disorder Identification Test Final Score (AUDIT) 3  3      GAD-7    Flowsheet Row Office Visit from 05/17/2022 in [facility] [place] Clinical Support from 05/21/2021 in [facility] Clinical Support from 04/27/2021 in [facility]  Total GAD-7 Score 12 1 2       PHQ2-9    Flowsheet Row [facility] Office Visit from 06/23/2023 in [facility] [place] Video Visit from 12/06/2022 in [facility] [place] Clinical Support from 07/08/2022 in [facility] Clinical Support from 07/01/2022 in [facility]  PHQ-2 Total Score 5 0 2 0 0  PHQ-9 Total Score 12 4 7 1 2       Flowsheet Row Admission (Discharged) from 06/09/2023 in [facility] Video Visit from 08/29/2022 in [facility] Video Visit from 11/30/2021 in [facility]  C-SSRS RISK CATEGORY No Risk Error: Question 6 not populated No Risk        Assessment and Plan: Patient is a 88 year old man with history of depression and anxiety.  Reviewed current medication.  Recommend to discontinue Vraylar since it does not help and may causing restlessness.  He also lost weight.  We have  discussed in the past about Wellbutrin and patient agreed to give a try.  Discontinue Vraylar, reduce Lexapro from 30 mg to 20 mg and start Wellbutrin XL 150 in the morning.  Continue Ativan twice a day as needed for anxiety.  We had discussion in the past about TMS but patient did not see any improvement.  We also discussed other options like Trintellix or Viibryd which he had not tried before.  Will keep these options in the future if needed.  Encourage walking, watching television and spend time with the wife.  Discussed sleep hygiene.  Recommend try to avoid taking nap during the day.  Will follow-up in 4 weeks.  I also recommend to call us back if is any question or any concern for the medication.  Discussed medication side effects and benefits.  Patient not interested in therapy.  Collaboration of Care: Collaboration of Care: Other provider involved in patient's care AEB notes are available in epic to review  Patient/Guardian was advised Release of Information must be obtained prior to any record release in order to collaborate their care with an outside provider. Patient/Guardian was advised if they have not already done so to contact the registration department to  sign all necessary forms in order for [name] to release information regarding their care.   Consent: Patient/Guardian gives verbal consent for treatment and assignment of benefits for services provided during this visit. Patient/Guardian expressed understanding and agreed to proceed.   I provided 25 minutes face-to-face time during this encounter.  I explained treatment plan to his wife and encouraged to give [name] a call back if he has any issues.  Cleotis Nipper, MD 02/01/2024, 10:23 AM

## 2024-02-12 ENCOUNTER — Telehealth (HOSPITAL_COMMUNITY): Payer: Self-pay | Admitting: *Deleted

## 2024-02-12 NOTE — Telephone Encounter (Signed)
 Pt would like to talk to his before he makes a decision about the Trintellix.

## 2024-02-12 NOTE — Telephone Encounter (Signed)
 Pt called to inform that since he as been taking the Wellbutrin XL 150 mg he has been experiencing increased anxiety, decreased sleep, dizziness, and nausea that he says lasts all day. Pt last seen, and Wellbutrin initiated, on 02/01/24. Lexapro continued. Pt has f/u appointment scheduled for 03/14/24. Please review and advise.

## 2024-02-12 NOTE — Telephone Encounter (Signed)
 Pt says he is not willing to stay on the Wellbutrin as he says "I can't function. It makes me really sick". Please advise.

## 2024-02-12 NOTE — Telephone Encounter (Signed)
 I will recommend cut down the Lexapro from 20 mg to taking only 10 mg and take the Wellbutrin first thing in the morning.  Usually side effects get better in 2 weeks.

## 2024-02-12 NOTE — Telephone Encounter (Signed)
 If he does not want to take the Wellbutrin then discontinue.  We can try Trintellix 10 mg however I informed him that it is a brand-name and may be expensive.  If he agree please call for Trintellix 10 mg daily and he will keep the Lexapro at present dose.

## 2024-02-13 ENCOUNTER — Telehealth (HOSPITAL_COMMUNITY): Payer: Self-pay | Admitting: *Deleted

## 2024-02-13 MED ORDER — VORTIOXETINE HBR 10 MG PO TABS: 10.0000 mg | ORAL_TABLET | Freq: Every day | ORAL | 0 refills | Status: DC

## 2024-02-13 NOTE — Telephone Encounter (Signed)
 I returned patient's phone call.  He had a question about cross tapering of Lexapro with Trintellix.  Recommend to reduce the Lexapro 20 mg from 30 mg and he will start Trintellix 10 mg for 10 days.  If no side effects then we will consider optimizing Trintellix and further reducing Lexapro.

## 2024-02-13 NOTE — Telephone Encounter (Signed)
 Pt called to say he's agreeable to start the Trintellix 10 mg every day and d/c Wellbutrin XL. Orders have been placed in MAR. Pt is asking for a taper schedule for the Lexapro after speaking with his son (MD). Please review and advise.

## 2024-02-20 ENCOUNTER — Telehealth (HOSPITAL_COMMUNITY): Payer: Self-pay | Admitting: *Deleted

## 2024-02-20 NOTE — Telephone Encounter (Signed)
 Pt called with c/o that since tapering off Celexa  and now on Trintellix  he has had insomnia and constipation. Pt says he feels like a "zombie" during the day due to decreased sleep. Pt says if he is to stay on the Trintellix  then he will need something for sleep. Please review and advise.

## 2024-02-20 NOTE — Telephone Encounter (Signed)
 Pt agrees to try.

## 2024-02-20 NOTE — Telephone Encounter (Signed)
 He can take 1 extra Ativan  at bedtime.

## 2024-02-26 ENCOUNTER — Telehealth (HOSPITAL_COMMUNITY): Payer: Self-pay

## 2024-02-26 NOTE — Telephone Encounter (Signed)
 I called patient and he verbally attested to taking 30 mg of the Lexapro  a day

## 2024-02-26 NOTE — Telephone Encounter (Signed)
 Patient is calling because he was not doing well on the Trintellix  - he states that it got really bad over the weekend. Patient called a friend of his that is a psychiatrist to ask him if he can safely stop the Trintellix . His friend told him that he could stop it. Patient stopped the Trintellix  and this morning went back to his escitalopram . Please review and advise, thank you

## 2024-02-26 NOTE — Telephone Encounter (Signed)
 Yes he can discontinue Trintellix  since dose is very low.  It is okay to go back to Lexapro  but need to take 30 mg.

## 2024-02-28 ENCOUNTER — Other Ambulatory Visit (HOSPITAL_COMMUNITY): Payer: Self-pay | Admitting: *Deleted

## 2024-02-28 ENCOUNTER — Telehealth (HOSPITAL_COMMUNITY): Payer: Self-pay | Admitting: *Deleted

## 2024-02-28 MED ORDER — MIRTAZAPINE 15 MG PO TABS: 15.0000 mg | ORAL_TABLET | Freq: Every day | ORAL | 0 refills | Status: DC

## 2024-02-28 NOTE — Telephone Encounter (Signed)
 Try multiple medication in the past including Trintellix , Celexa , Wellbutrin  recently and did not work.  I did not see that he is taking Lexapro  which was recommended on the last telephone call.  Please check with him and clarify I would like him to continue Lexapro  30 mg and please add it into his medication list.  Yes, he can try mirtazapine  15 mg at bedtime but not to add Abilify  until he has next appointment.  Please call mirtazapine  15 mg to his pharmacy.

## 2024-02-28 NOTE — Telephone Encounter (Signed)
 Pt called with c/o continued insomnia. Pt says he needs "something stronger" for sleep and asked if may try Remeron  again as "that worked". Looks like in 2021 pt took Remeron  15 mg at bedtime for a short period. [name] is also requesting to restart Abilify  5 mg and states he still has some and would like to start ASAP. Pt has f/u appointment scheduled for 03/14/24. Please review and advise.

## 2024-02-28 NOTE — Telephone Encounter (Signed)
 Writer spoke with pt and then his wife asked to speak to me. Mrs. Shave says she is very worried, and sounded distressed, about pt. She states that he is not sleeping advised regarding Remeron  script), and not eating. She advises that he is extremely anxious and looks "hallowed". They would like earlier appointment (03/14/24). Please review.

## 2024-02-29 ENCOUNTER — Encounter (HOSPITAL_COMMUNITY): Payer: Self-pay | Admitting: Psychiatry

## 2024-02-29 ENCOUNTER — Telehealth (HOSPITAL_BASED_OUTPATIENT_CLINIC_OR_DEPARTMENT_OTHER): Admitting: Psychiatry

## 2024-02-29 DIAGNOSIS — F332 Major depressive disorder, recurrent severe without psychotic features: Secondary | ICD-10-CM

## 2024-02-29 DIAGNOSIS — F419 Anxiety disorder, unspecified: Secondary | ICD-10-CM

## 2024-02-29 MED ORDER — MIRTAZAPINE 30 MG PO TABS: 30.0000 mg | ORAL_TABLET | Freq: Every day | ORAL | 0 refills | Status: DC

## 2024-02-29 MED ORDER — LORAZEPAM 0.5 MG PO TABS: 0.5000 mg | ORAL_TABLET | Freq: Three times a day (TID) | ORAL | 0 refills | Status: DC | PRN

## 2024-02-29 NOTE — Progress Notes (Signed)
 [facility] MD Virtual Progress Note   Patient Location: Home Provider Location: Office  I connect with patient by video and verified that I am speaking with correct person by using two identifiers. I discussed the limitations of evaluation and management by telemedicine and the availability of in person appointments. I also discussed with the patient that there may be a patient responsible charge related to this service. The patient expressed understanding and agreed to proceed.  [name] [id number] 88 y.o.  02/29/2024 11:06 AM  History of Present Illness:  Patient is evaluated by video session.  He had called few times concerned about his increased anxiety and not feeling well.  We tried him on Trintellix  but he feel it made him worse.  He could not sleep and feel more anxious and nervous.  We have tried Abilify  that did not help him either.  Earlier we tried Wellbutrin  but did not see any improvement.  He is not sleeping very well.  His wife reported that he is not leaving the house, decreased appetite and may have lost weight but did not check weight recently.  Patient remains very anxious, nervous, hopeless and dysphoric.  He has no energy, he does not go outside and even some time struggled with shower.  He is taking Lexapro  30 mg.  We recently started him on mirtazapine  15 mg.  He took last night and able to sleep 6 hours but it took few hours to get into sleep.  He has no tremor or shakes.  He is taking Ativan  as needed.  He is not sure what is causing so anxiety but feel it is increase recently.  He used to watch television but has not been active at all.  Past Psychiatric History: H/O depression and anxiety. Tried Celexa , Pristiq , Remeron , Cymbalta , and Xanax  but limited outcome. Klonopin  caused black stool and Buspar  made groggy.  Had TMS but not helpful.  No h/o suicidal attempt, mania, psychosis or self abusive behavior.  Saw Dr. Gutterman in the past for therapy.  He  had genetic testing and the results are in the chart.  Abilify , Vraylar , Trintellix , Wellbutrin  were not effective.      Outpatient Encounter Medications as of 02/29/2024  Medication Sig   mirtazapine  (REMERON ) 15 MG tablet Take 1 tablet (15 mg total) by mouth at bedtime.   acetaminophen  (TYLENOL ) 500 MG tablet Take 1,000 mg by mouth every 6 (six) hours as needed for moderate pain or headache.   amLODipine  (NORVASC ) 5 MG tablet Take 1 tablet (5 mg total) by mouth daily.   aspirin  EC 81 MG tablet Take 1 tablet (81 mg total) by mouth daily. Swallow whole.   atorvastatin  (LIPITOR) 10 MG tablet Take 1 tablet (10 mg total) by mouth daily.   clobetasol cream (TEMOVATE) 0.05 % Apply 1 application  topically 2 (two) times daily.   finasteride  (PROSCAR ) 5 MG tablet Take 5 mg by mouth daily.   hydrocortisone  2.5 % cream Apply topically 2 (two) times daily as needed. (Patient taking differently: Apply 1 application  topically 2 (two) times daily as needed (rash).)   hydrOXYzine  (ATARAX ) 10 MG tablet Take 1 tablet (10 mg total) by mouth 3 (three) times daily as needed.   LORazepam  (ATIVAN ) 0.5 MG tablet Take 1 tablet (0.5 mg total) by mouth 2 (two) times daily as needed for anxiety.   omeprazole  (PRILOSEC) 40 MG capsule TAKE 1 CAPSULE BY MOUTH DAILY   valsartan  (DIOVAN ) 160 MG tablet TAKE 1 TABLET(160  MG) BY MOUTH DAILY   vortioxetine  HBr (TRINTELLIX ) 10 MG TABS tablet Take 1 tablet (10 mg total) by mouth daily.   No facility-administered encounter medications on file as of 02/29/2024.    No results found for this or any previous visit (from the past 2160 hours).   Psychiatric Specialty Exam: Physical Exam  Review of Systems  Constitutional:  Positive for activity change and fatigue.  Psychiatric/Behavioral:  Positive for dysphoric mood and sleep disturbance. The patient is nervous/anxious.     There were no vitals taken for this visit.There is no height or weight on file to calculate BMI.  General  Appearance: Fairly Groomed  Eye Contact:  Fair  Speech:  Slow  Volume:  Decreased  Mood:  Anxious, Depressed, Dysphoric, and Hopeless  Affect:  Constricted and Depressed  Thought Process:  Descriptions of Associations: Intact  Orientation:  Full (Time, Place, and Person)  Thought Content:  Rumination  Suicidal Thoughts:  No  Homicidal Thoughts:  No  Memory:  Immediate;   Fair Recent;   Fair Remote;   Good  Judgement:  Fair  Insight:  Shallow  Psychomotor Activity:  Decreased  Concentration:  Concentration: Fair and Attention Span: Fair  Recall:  [employer] of Knowledge:  Fair  Language:  Good  Akathisia:  No  Handed:  Right  AIMS (if indicated):     Assets:  Communication Skills Desire for Improvement Housing Social Support  ADL's:  Intact  Cognition:  WNL  Sleep:  poor       01/04/2024    1:49 PM 06/23/2023    4:03 PM 12/06/2022    5:04 PM 07/08/2022    9:32 AM 07/01/2022   10:31 AM  Depression screen PHQ 2/9  Decreased Interest 3 0 0 0 0  Down, Depressed, Hopeless 2 0 2 0 0  PHQ - 2 Score 5 0 2 0 0  Altered sleeping 2 2 0 0 1  Tired, decreased energy 2 2 0 1 1  Change in appetite 0 0 2 0 0  Feeling bad or failure about yourself  1 0 0 0 0  Trouble concentrating 1 0 3 0 0  Moving slowly or fidgety/restless 1 0 0 0 0  Suicidal thoughts 0 0 0 0 0  PHQ-9 Score 12 4 7 1 2   Difficult doing work/chores Somewhat difficult Somewhat difficult Not difficult at all      Assessment/Plan: Major depressive disorder, recurrent, severe without psychotic features (HCC) - Plan: mirtazapine  (REMERON ) 30 MG tablet  Anxiety - Plan: mirtazapine  (REMERON ) 30 MG tablet, LORazepam  (ATIVAN ) 0.5 MG tablet  I review collateral formation and recent telephone messages from the patient.  He is no longer taking Trintellix .  In the past he had tried Vraylar , Wellbutrin , Abilify .  He is back on Lexapro  30 mg.  Recommend to increase mirtazapine  to 30 mg if no issues.  He took last night 50 mg which  helped 6 hours sleep.  I also discussed possible side effects of mirtazapine  including sweating but overall benefit to increased appetite, anxiety and helping sleep.  Patient and his wife agreed to give a try.  I also recommend to try Ativan  0.5 mg up to 3 times a day if he feels very nervous or anxious.  Continue Lexapro  30 mg daily.  Patient like to keep the appointment in person in 2 weeks.  Encouraged to call us  back if there is any question, concern or if he feels worsening of symptoms.  Follow-up  in 2 weeks in person.   Follow Up Instructions:     I discussed the assessment and treatment plan with the patient. The patient was provided an opportunity to ask questions and all were answered. The patient agreed with the plan and demonstrated an understanding of the instructions.   The patient was advised to call back or seek an in-person evaluation if the symptoms worsen or if the condition fails to improve as anticipated.    Collaboration of Care: Other provider involved in patient's care AEB notes are available in epic to review  Patient/Guardian was advised Release of Information must be obtained prior to any record release in order to collaborate their care with an outside provider. Patient/Guardian was advised if they have not already done so to contact the registration department to sign all necessary forms in order for us  to release information regarding their care.   Consent: Patient/Guardian gives verbal consent for treatment and assignment of benefits for services provided during this visit. Patient/Guardian expressed understanding and agreed to proceed.     Total encounter time 27 minutes which includes face-to-face time, chart reviewed, care coordination, order entry and documentation during this encounter.   Note: This document was prepared by [employer] voice dictation technology and any errors that results from this process are unintentional.    Arturo Late, MD 02/29/2024

## 2024-03-01 ENCOUNTER — Other Ambulatory Visit (HOSPITAL_COMMUNITY): Payer: Self-pay | Admitting: Psychiatry

## 2024-03-04 ENCOUNTER — Telehealth (HOSPITAL_COMMUNITY): Payer: Self-pay | Admitting: *Deleted

## 2024-03-04 NOTE — Telephone Encounter (Signed)
 Pt called to advise that he increased Remeron  to 30 mg but felt too sedated next morning. Pt has been taking the 15 mg for several nights and he says this dose has been working well. FYI.

## 2024-03-11 ENCOUNTER — Other Ambulatory Visit (HOSPITAL_COMMUNITY): Payer: Self-pay | Admitting: *Deleted

## 2024-03-11 ENCOUNTER — Telehealth (HOSPITAL_COMMUNITY): Payer: Self-pay | Admitting: *Deleted

## 2024-03-11 MED ORDER — ARIPIPRAZOLE 2 MG PO TABS: 2.0000 mg | ORAL_TABLET | Freq: Every day | ORAL | 0 refills | Status: DC

## 2024-03-11 NOTE — Telephone Encounter (Signed)
 You mention he has leftover Abilify .  If he has then should be 2 mg otherwise you can call the 2 mg tablet to the pharmacy

## 2024-03-11 NOTE — Telephone Encounter (Signed)
 Done. He said he has the 5 mg's.

## 2024-03-11 NOTE — Telephone Encounter (Signed)
 In the past he had tried Abilify  but it caused akathisia.  If he like to go back again I would recommend to try only 2 mg and we will discuss on his upcoming appointment about further medication management.

## 2024-03-11 NOTE — Telephone Encounter (Signed)
 Pt called this morning to request going back on the Abilify  5 mg. Pt says the Remeron  30 mg has given him "a terrible time" with continued difficulty sleeping and decreased appetite. Pt has mentioned constipation previously as well. [name] is scheduled to see you again on 03/14/24. Pt says he still has some of the Abilify  left. Please review and advise.

## 2024-03-11 NOTE — Telephone Encounter (Signed)
 Can he break the 5 mg in half (2.5 mg total)? Or I can send bridge to appointment on 03/14/24.

## 2024-03-13 ENCOUNTER — Other Ambulatory Visit: Payer: Self-pay | Admitting: Family Medicine

## 2024-03-14 ENCOUNTER — Ambulatory Visit (HOSPITAL_COMMUNITY): Admitting: Psychiatry

## 2024-03-14 ENCOUNTER — Encounter (HOSPITAL_COMMUNITY): Payer: Self-pay | Admitting: Psychiatry

## 2024-03-14 ENCOUNTER — Other Ambulatory Visit: Payer: Self-pay

## 2024-03-14 VITALS — BP 144/73 | HR 73 | Ht 66.0 in | Wt 137.0 lb

## 2024-03-14 DIAGNOSIS — F419 Anxiety disorder, unspecified: Secondary | ICD-10-CM

## 2024-03-14 DIAGNOSIS — F332 Major depressive disorder, recurrent severe without psychotic features: Secondary | ICD-10-CM

## 2024-03-14 MED ORDER — ESCITALOPRAM OXALATE 20 MG PO TABS: 30.0000 mg | ORAL_TABLET | Freq: Every day | ORAL | 1 refills | Status: DC

## 2024-03-14 NOTE — Progress Notes (Signed)
 [facility] MD/PA/NP OP Progress Note  Patient location; office Provider location; office  03/14/2024 12:03 PM [name]  MRN:  [id number]  Chief Complaint:  Chief Complaint  Patient presents with   Follow-up   Anxiety   Depression   HPI: Patient came today with his wife for his follow-up appointment.  He is back on Abilify  after mirtazapine  did not help his mood depression and anxiety.  He is taking Abilify  2 mg.  In the past he has taken Abilify  but told ineffective by him and his son and having symptoms of akathisia and it was discontinued.  Patient and his wife reported some improvement with Abilify .  He had a better sleep and somewhat more energy.  Today he has a plan to have a lunch with his wife.  So far no major concern and tolerating the medication.  He has no tremors, shakes or any EPS.  He continues to take Lexapro  30 mg in Ativan  0.5 mg in the morning 1 hour apart.  Very rarely he takes the third Ativan .  He has some hopelessness but denies any suicidal thoughts or homicidal thoughts.  His appetite is okay.  Patient wife is encouraging him to travel to [place] to see their 3 year old granddaughter classical dance performance.  Patient told they have been going almost every year but lately has no travel and not sure he if he can go.  Patient told he saw his son recently on Mother's Day who traveled from [place].  Patient denies any irritability, anger, mania.  He had taken a lot of medication in past few months.  We have GeneSight testing and results were reviewed with him.  So far he has no nausea.  He denies drinking or using any illegal substances.  Visit Diagnosis:    ICD-10-CM   1. Major depressive disorder, recurrent, severe without psychotic features (HCC)  F33.2     2. Anxiety  F41.9       Past Psychiatric History: Reviewed H/O depression and anxiety. Tried Celexa , Pristiq , Remeron , Cymbalta , and Xanax  but limited outcome. Klonopin  caused black stool and Buspar  made groggy.   Had TMS but not helpful.  No h/o suicidal attempt, mania, psychosis or self abusive behavior.  Saw Dr. Gutterman in the past for therapy.  He had genetic testing and the results are in the chart.  Abilify , Vraylar , Trintellix , Wellbutrin  were not effective.     Past Medical History:  Past Medical History:  Diagnosis Date   Anxiety    BPH (benign prostatic hypertrophy)    DDD (degenerative disc disease), lumbar 2002   Depression    Difficult intubation 02/22/2019   Anterior with limited oral opening   Essential hypertension 03/10/2019   GERD (gastroesophageal reflux disease)    History of colon polyps    History of hiatal hernia 2012   Hyperlipemia    Mild   Indwelling Foley catheter present    Iron deficiency anemia    IV iron therapy    Mild hyperlipidemia 02/20/2008   Qualifier: Diagnosis of  By: Penney Bowling MD, Bruce     Pre-diabetes    Renal cyst 2008   7.6 cm lower pole right renal cyst   Sleep apnea    slight, no CPAP used    Past Surgical History:  Procedure Laterality Date   COLONOSCOPY  08/2011   COLONOSCOPY WITH PROPOFOL  N/A 06/09/2023   Procedure: COLONOSCOPY WITH PROPOFOL ;  Surgeon: Alvis Jourdain, MD;  Location: [facility];  Service: Gastroenterology;  Laterality: N/A;  ESOPHAGOGASTRODUODENOSCOPY (EGD) WITH PROPOFOL  N/A 06/09/2023   Procedure: ESOPHAGOGASTRODUODENOSCOPY (EGD) WITH PROPOFOL ;  Surgeon: Alvis Jourdain, MD;  Location: [facility];  Service: Gastroenterology;  Laterality: N/A;   NASAL SEPTOPLASTY W/ TURBINOPLASTY  08/03/2012   Procedure: NASAL SEPTOPLASTY WITH TURBINATE REDUCTION;  Surgeon: Janita Mellow, MD;  Location[facility] OR;  Service: ENT;  Laterality: Bilateral;   POLYPECTOMY  06/09/2023   Procedure: POLYPECTOMY;  Surgeon: Alvis Jourdain, MD;  Location: [facility];  Service: Gastroenterology;;   UPPER GASTROINTESTINAL ENDOSCOPY  08/2011   XI ROBOTIC ASSISTED SIMPLE PROSTATECTOMY N/A 02/22/2019   Procedure: XI ROBOTIC ASSISTED SIMPLE PROSTATECTOMY;  Surgeon:  Osborn Blaze, MD;  Location: [facility] ORS;  Service: Urology;  Laterality: N/A;    Family Psychiatric History: Reviewed  Family History:  Family History  Problem Relation Age of Onset   Heart attack Father    Hypertension Father    Hypertension Sister    Hypertension Brother    Hypertension Brother    Hypertension Daughter     Social History:  Social History   Socioeconomic History   Marital status: Married    Spouse name: Not on file   Number of children: 2   Years of education: Not on file   Highest education level: Master's degree (e.g., MA, MS, MEng, MEd, MSW, MBA)  Occupational History   Not on file  Tobacco Use   Smoking status: Former    Current packs/day: 0.00    Average packs/day: 0.5 packs/day for 10.0 years (5.0 ttl pk-yrs)    Types: Cigarettes    Start date: 74    Quit date: 1969    Years since quitting: 56.4   Smokeless tobacco: Never   Tobacco comments:    rare use over 50 years   Vaping Use   Vaping status: Never Used  Substance and Sexual Activity   Alcohol use: Yes    Alcohol/week: 1.0 standard drink of alcohol    Types: 1 Shots of liquor per week    Comment: weekly   Drug use: No   Sexual activity: Never    Birth control/protection: None  Other Topics Concern   Not on file  Social History Narrative   Lives in 2 level    Will stay for now    Just moved here approx 1.5 yo   Very nice neighbors    [employer]  x 5 with a very busy holiday   One son is a Therapist, sports and nephew is a Counsellor    Son in [place] moved to [place] and he misses his grand children    As 2 brothers in [place]       Retired from [employer], [employer]       Social Drivers of Corporate investment banker Strain: Low Risk  (06/23/2023)   Overall Financial Resource Strain (CARDIA)    Difficulty of Paying Living Expenses: Not hard at all  Food Insecurity: No Food Insecurity (06/23/2023)   Hunger Vital Sign    Worried About Running Out of Food in the Last Year: Never true    Ran  Out of Food in the Last Year: Never true  Transportation Needs: No Transportation Needs (06/23/2023)   PRAPARE - Administrator, Civil Service (Medical): No    Lack of Transportation (Non-Medical): No  Physical Activity: Insufficiently Active (06/23/2023)   Exercise Vital Sign    Days of Exercise per Week: 3 days    Minutes of Exercise per Session: 20 min  Stress: No Stress  Concern Present (06/23/2023)   [employer] of Occupational Health - Occupational Stress Questionnaire    Feeling of Stress : Not at all  Social Connections: Unknown (06/23/2023)   Social Connection and Isolation Panel [NHANES]    Frequency of Communication with Friends and Family: Three times a week    Frequency of Social Gatherings with Friends and Family: Once a week    Attends Religious Services: 1 to 4 times per year    Active Member of [employer] or Organizations: No    Attends Banker Meetings: Not on file    Marital Status: Not on file    Allergies:  Allergies  Allergen Reactions   Dexlansoprazole     Other reaction(s): Unknown    Metabolic Disorder Labs: Lab Results  Component Value Date   HGBA1C 6.1 06/03/2022   MPG 116.89 02/20/2019   No results found for: "PROLACTIN" Lab Results  Component Value Date   CHOL 130 06/03/2022   TRIG 48.0 06/03/2022   HDL 57.60 06/03/2022   CHOLHDL 2 06/03/2022   VLDL 9.6 06/03/2022   LDLCALC 63 06/03/2022   LDLCALC 112 (H) 02/17/2021   Lab Results  Component Value Date   TSH 3.94 06/23/2023   TSH 3.02 02/17/2021    Therapeutic Level Labs: No results found for: "LITHIUM" No results found for: "VALPROATE" No results found for: "CBMZ"  Current Medications: Current Outpatient Medications  Medication Sig Dispense Refill   acetaminophen  (TYLENOL ) 500 MG tablet Take 1,000 mg by mouth every 6 (six) hours as needed for moderate pain or headache.     amLODipine  (NORVASC ) 5 MG tablet Take 1 tablet (5 mg total) by mouth daily. 90 tablet  3   ARIPiprazole  (ABILIFY ) 2 MG tablet Take 1 tablet (2 mg total) by mouth daily. 5 tablet 0   aspirin  EC 81 MG tablet Take 1 tablet (81 mg total) by mouth daily. Swallow whole. 90 tablet 3   clobetasol cream (TEMOVATE) 0.05 % Apply 1 application  topically 2 (two) times daily.     escitalopram  (LEXAPRO ) 20 MG tablet Take 30 mg by mouth daily.     finasteride  (PROSCAR ) 5 MG tablet Take 5 mg by mouth daily.     hydrocortisone  2.5 % cream Apply topically 2 (two) times daily as needed. (Patient taking differently: Apply 1 application  topically 2 (two) times daily as needed (rash).) 30 g 1   hydrOXYzine  (ATARAX ) 10 MG tablet Take 1 tablet (10 mg total) by mouth 3 (three) times daily as needed. 40 tablet 0   LORazepam  (ATIVAN ) 0.5 MG tablet Take 1 tablet (0.5 mg total) by mouth 3 (three) times daily as needed for anxiety. 90 tablet 0   omeprazole  (PRILOSEC) 40 MG capsule TAKE 1 CAPSULE BY MOUTH DAILY 100 capsule 1   valsartan  (DIOVAN ) 160 MG tablet TAKE 1 TABLET(160 MG) BY MOUTH DAILY 90 tablet 0   atorvastatin  (LIPITOR) 10 MG tablet Take 1 tablet (10 mg total) by mouth daily. (Patient not taking: Reported on 03/14/2024) 90 tablet 3   mirtazapine  (REMERON ) 30 MG tablet Take 1 tablet (30 mg total) by mouth at bedtime. (Patient not taking: Reported on 03/14/2024) 30 tablet 0   No current facility-administered medications for this visit.     Musculoskeletal: Strength & Muscle Tone: within normal limits Gait & Station: normal Patient leans: N/A  Psychiatric Specialty Exam: Review of Systems  Blood pressure (!) 144/73, pulse 73, height 5\' 6"  (1.676 m), weight 137 lb (62.1 kg).Body mass index is  22.11 kg/m.  General Appearance: Casual  Eye Contact:  Fair  Speech:  Slow  Volume:  Decreased  Mood:  Euthymic  Affect:  Congruent  Thought Process:  Goal Directed  Orientation:  Full (Time, Place, and Person)  Thought Content: Logical   Suicidal Thoughts:  No  Homicidal Thoughts:  No  Memory:   Immediate;   Good Recent;   Fair Remote;   Fair  Judgement:  Intact  Insight:  Present  Psychomotor Activity:  Decreased  Concentration:  Concentration: Fair and Attention Span: Fair  Recall:  Good  Fund of Knowledge: Good  Language: Good  Akathisia:  No  Handed:  Right  AIMS (if indicated): not done  Assets:  Communication Skills Desire for Improvement Housing Social Support  ADL's:  Intact  Cognition: WNL  Sleep:  better   Screenings: AUDIT    Loss adjuster, chartered Office Visit from 06/23/2023 in [facility] [place] Clinical Support from 12/14/2020 in [facility] [place]  Alcohol Use Disorder Identification Test Final Score (AUDIT) 3  3      GAD-7    Flowsheet Row Office Visit from 05/17/2022 in [facility] Clinical Support from 05/21/2021 in [facility] Clinical Support from 04/27/2021 in [facility]  Total GAD-7 Score 12 1 2       PHQ2-9    Flowsheet Row [facility]/04[facility]2024 [facility] Office Visit from 06/23/2023 in [facility] [place] Video Visit from 12[facility]06/2022 in [facility] [place] Clinical Support from 07/08/2022 in [facility] Clinical Support from 07/01/2022 in [facility]  PHQ-2 Total Score 5 0 2 0 0  PHQ-9 Total Score 12 4 7 1 2       Flowsheet Row Admission (Discharged) from 06/09/2023 in [facility] Video Visit from 08/29/2022 in [facility] Video Visit from 11/30/2021 in [facility]  C-SSRS RISK CATEGORY No Risk Error: Question 6 not populated No Risk        Assessment and Plan: Patient is 88 year old married man with history of depression, anxiety.  We had tried multiple medication in recent  months including Vraylar , Wellbutrin , Trintellix , mirtazapine .  Now he is back on Abilify  which initially did not help but now him and his wife feel it is helping.  He is on 2 mg.  I recommend to keep the 2 mg for another 2 weeks and then he can take the 5 mg.  Reminded that the Abilify  can cause akathisia and he need to watch carefully about the symptoms.  Continue Lexapro  30 mg daily and Ativan  0.5 mg which he usually take in the morning 1 hour apart and very rarely take third pill if needed.  He is no longer taking mirtazapine .  I encourage to visit his granddaughter in [place] who is performing classical dance in annual event.  Patient agreed to look into his travel plan.  Today he is going to have a lunch with his wife and that is positive from the past.  So far no major issues with the medication.  Recommend to call us  back with any question or any concern.  Follow-up in 6 weeks in person.  I also discussed briefly about ECT.  Patient has TMS which did not work for him.  If patient's current medication continues to work then will not consider ECT.  Collaboration of Care: Collaboration of  Care: Other provider involved in patient's care AEB notes are available in epic to review  Patient/Guardian was advised Release of Information must be obtained prior to any record release in order to collaborate their care with an outside provider. Patient/Guardian was advised if they have not already done so to contact the registration department to sign all necessary forms in order for us  to release information regarding their care.   Consent: Patient/Guardian gives verbal consent for treatment and assignment of benefits for services provided during this visit. Patient/Guardian expressed understanding and agreed to proceed.   Total encounter time 32 minutes which included face-to-face time, chart reviewed, care coordination, order entry and documentation during this encounter.  Arturo Late, MD 03/14/2024, 12:03  PM

## 2024-03-21 ENCOUNTER — Ambulatory Visit (INDEPENDENT_AMBULATORY_CARE_PROVIDER_SITE_OTHER): Admitting: Family Medicine

## 2024-03-21 ENCOUNTER — Encounter: Payer: Self-pay | Admitting: Family Medicine

## 2024-03-21 DIAGNOSIS — Z0000 Encounter for general adult medical examination without abnormal findings: Secondary | ICD-10-CM | POA: Diagnosis not present

## 2024-03-21 NOTE — Progress Notes (Signed)
 PATIENT CHECK-IN and HEALTH RISK ASSESSMENT QUESTIONNAIRE:  -completed by phone/video for upcoming Medicare Preventive Visit   Pre-Visit Check-in: 1)Vitals (height, wt, BP, etc) - record in vitals section for visit on day of visit Request home vitals (wt, BP, etc.) and enter into vitals, THEN update Vital Signs SmartPhrase below at the top of the HPI. See below.  2)Review and Update Medications, Allergies PMH, Surgeries, Social history in Epic 3)Hospitalizations in the last year with date/reason? no  4)Review and Update Care Team (patient's specialists) in Epic 5) Complete PHQ9 in Epic  6) Complete Fall Screening in Epic 7)Review all Health Maintenance Due and order under PCP if not done.  Medicare Wellness Patient Questionnaire:  Answer theses question about your habits: How often do you have a drink containing alcohol?N/A How many drinks containing alcohol do you have on a typical day when you are drinking?N/a How often do you have six or more drinks on one occasion?na Have you ever smoked?Yes  Quit date if applicable? 50 years ago   How many packs a day do/did you smoke? 1 Do you use smokeless tobacco?No Do you use an illicit drugs?No On average, how many days per week do you engage in moderate to strenuous exercise (like a brisk walk)?N/A On average, how many minutes do you engage in exercise at this level?N/A Are you sexually active? No Number of partners?N/A Typical breakfast: Varies  Typical lunch: Varies  Typical dinner: Varies  Typical snacks: Chips and cookies   Beverages:  Water    Answer theses question about your everyday activities: Can you perform most household chores?Yes  Are you deaf or have significant trouble hearing? no Do you feel that you have a problem with memory?no Do you feel safe at home?Yes  Last dentist visit? 3 months ago  8. Do you have any difficulty performing your everyday activities?no Are you having any difficulty walking, taking  medications on your own, and or difficulty managing daily home needs?no Do you have difficulty walking or climbing stairs?no Do you have difficulty dressing or bathing?no Do you have difficulty doing errands alone such as visiting a doctor's office or shopping? Yes  Do you currently have any difficulty preparing food and eating? no Do you currently have any difficulty using the toilet? no Do you have any difficulty managing your finances? no Do you have any difficulties with housekeeping of managing your housekeeping?no   Do you have Advanced Directives in place (Living Will, Healthcare Power or Attorney)?  Yes    Last eye Exam and location? 09/24    Do you currently use prescribed or non-prescribed narcotic or opioid pain medications? no  Do you have a history or close family history of breast, ovarian, tubal or peritoneal cancer or a family member with BRCA (breast cancer susceptibility 1 and 2) gene mutations? no   Nurse/Assistant Credentials/time stamp: Mg 3:22 PM   ----------------------------------------------------------------------------------------------------------------------------------------------------------------------------------------------------------------------  Because this visit was a virtual/telehealth visit, some criteria may be missing or patient reported. Any vitals not documented were not able to be obtained and vitals that have been documented are patient reported.    MEDICARE ANNUAL PREVENTIVE CARE VISIT WITH PROVIDER (Welcome to Medicare, initial annual wellness or annual wellness exam)  Virtual Visit via Video Note  I connected with [name] on 03/21/24  by a video enabled telemedicine application and verified that I am speaking with the correct person using two identifiers.  Location patient: home Location provider:work or home office Persons participating in the virtual visit:  patient, provider  Concerns and/or follow up today: has changed  psych meds recently with his psychiatrist and improving some now.    See HM section in Epic for other details of completed HM.    ROS: negative for report of fevers, unintentional weight loss, vision changes, vision loss, hearing loss or change, chest pain, sob, hemoptysis, melena, hematochezia, hematuria, falls, bleeding or bruising, thoughts of suicide or self harm, memory loss  Patient-completed extensive health risk assessment - reviewed and discussed with the patient: See Health Risk Assessment completed with patient prior to the visit either above or in recent phone note. This was reviewed in detailed with the patient today and appropriate recommendations, orders and referrals were placed as needed per Summary below and patient instructions.   Review of Medical History: -PMH, PSH, Family History and current specialty and care providers reviewed and updated and listed below   Patient Care Team: Marquetta Sit, MD as PCP - General (Family Medicine) Knox Perl, MD as PCP - Cardiology (Cardiology) Tami Falcon, MD as Consulting Physician (Gastroenterology) Corie Diamond, MD as Consulting Physician (Ophthalmology) Arturo Late, MD as Consulting Physician (Psychiatry) Cameron Cea, MD as Consulting Physician (Hematology and Oncology)   Past Medical History:  Diagnosis Date   Anxiety    BPH (benign prostatic hypertrophy)    DDD (degenerative disc disease), lumbar 2002   Depression    Difficult intubation 02/22/2019   Anterior with limited oral opening   Essential hypertension 03/10/2019   GERD (gastroesophageal reflux disease)    History of colon polyps    History of hiatal hernia 2012   Hyperlipemia    Mild   Indwelling Foley catheter present    Iron deficiency anemia    IV iron therapy    Mild hyperlipidemia 02/20/2008   Qualifier: Diagnosis of  By: Penney Bowling MD, Bruce     Pre-diabetes    Renal cyst 2008   7.6 cm lower pole right renal cyst   Sleep apnea    slight, no  CPAP used    Past Surgical History:  Procedure Laterality Date   COLONOSCOPY  08/2011   COLONOSCOPY WITH PROPOFOL  N/A 06/09/2023   Procedure: COLONOSCOPY WITH PROPOFOL ;  Surgeon: Alvis Jourdain, MD;  Location: [facility];  Service: Gastroenterology;  Laterality: N/A;   ESOPHAGOGASTRODUODENOSCOPY (EGD) WITH PROPOFOL  N/A 06/09/2023   Procedure: ESOPHAGOGASTRODUODENOSCOPY (EGD) WITH PROPOFOL ;  Surgeon: Alvis Jourdain, MD;  Location: [facility];  Service: Gastroenterology;  Laterality: N/A;   NASAL SEPTOPLASTY W/ TURBINOPLASTY  08/03/2012   Procedure: NASAL SEPTOPLASTY WITH TURBINATE REDUCTION;  Surgeon: Janita Mellow, MD;  Location: [facility] OR;  Service: ENT;  Laterality: Bilateral;   POLYPECTOMY  06/09/2023   Procedure: POLYPECTOMY;  Surgeon: Alvis Jourdain, MD;  Location: [facility];  Service: Gastroenterology;;   UPPER GASTROINTESTINAL ENDOSCOPY  08/2011   XI ROBOTIC ASSISTED SIMPLE PROSTATECTOMY N/A 02/22/2019   Procedure: XI ROBOTIC ASSISTED SIMPLE PROSTATECTOMY;  Surgeon: Osborn Blaze, MD;  Location: [facility];  Service: Urology;  Laterality: N/A;    Social History   Socioeconomic History   Marital status: Married    Spouse name: Not on file   Number of children: 2   Years of education: Not on file   Highest education level: Master's degree (e.g., MA, MS, MEng, MEd, MSW, MBA)  Occupational History   Not on file  Tobacco Use   Smoking status: Former    Current packs/day: 0.00    Average packs/day: 0.5 packs/day for 10.0 years (5.0 ttl pk-yrs)  Types: Cigarettes    Start date: 27    Quit date: 1969    Years since quitting: 41.4   Smokeless tobacco: Never   Tobacco comments:    rare use over 50 years   Vaping Use   Vaping status: Never Used  Substance and Sexual Activity   Alcohol use: Yes    Alcohol/week: 1.0 standard drink of alcohol    Types: 1 Shots of liquor per week    Comment: weekly   Drug use: No   Sexual activity: Never    Birth control/protection: None  Other  Topics Concern   Not on file  Social History Narrative   Lives in 2 level    Will stay for now    Just moved here approx 1.5 yo   Very nice neighbors    [employer]  x 5 with a very busy holiday   One son is a Therapist, sports and nephew is a Counsellor    Son in [place] moved to [place] and he misses his grand children    As 2 brothers in [place]       Retired from [employer], [employer]       Social Drivers of Corporate investment banker Strain: Low Risk  (03/21/2024)   Overall Financial Resource Strain (CARDIA)    Difficulty of Paying Living Expenses: Not hard at all  Food Insecurity: No Food Insecurity (03/21/2024)   Hunger Vital Sign    Worried About Running Out of Food in the Last Year: Never true    Ran Out of Food in the Last Year: Never true  Transportation Needs: No Transportation Needs (03/21/2024)   PRAPARE - Administrator, Civil Service (Medical): No    Lack of Transportation (Non-Medical): No  Physical Activity: Inactive (03/21/2024)   Exercise Vital Sign    Days of Exercise per Week: 0 days    Minutes of Exercise per Session: 0 min  Stress: Stress Concern Present (03/21/2024)   [employer] of Occupational Health - Occupational Stress Questionnaire    Feeling of Stress : Very much  Social Connections: Moderately Isolated (03/21/2024)   Social Connection and Isolation Panel [NHANES]    Frequency of Communication with Friends and Family: Twice a week    Frequency of Social Gatherings with Friends and Family: Once a week    Attends Religious Services: Never    Database administrator or Organizations: No    Attends Banker Meetings: Never    Marital Status: Married  Catering manager Violence: Not At Risk (03/21/2024)   Humiliation, Afraid, Rape, and Kick questionnaire    Fear of Current or Ex-Partner: No    Emotionally Abused: No    Physically Abused: No    Sexually Abused: No    Family History  Problem Relation Age of Onset   Heart attack Father     Hypertension Father    Hypertension Sister    Hypertension Brother    Hypertension Brother    Hypertension Daughter     Current Outpatient Medications on File Prior to Visit  Medication Sig Dispense Refill   acetaminophen  (TYLENOL ) 500 MG tablet Take 1,000 mg by mouth every 6 (six) hours as needed for moderate pain or headache.     amLODipine  (NORVASC ) 5 MG tablet Take 1 tablet (5 mg total) by mouth daily. 90 tablet 3   ARIPiprazole  (ABILIFY ) 2 MG tablet Take 1 tablet (2 mg total) by mouth daily. 5 tablet 0  aspirin  EC 81 MG tablet Take 1 tablet (81 mg total) by mouth daily. Swallow whole. 90 tablet 3   atorvastatin  (LIPITOR) 10 MG tablet Take 1 tablet (10 mg total) by mouth daily. 90 tablet 3   clobetasol cream (TEMOVATE) 0.05 % Apply 1 application  topically 2 (two) times daily.     escitalopram  (LEXAPRO ) 20 MG tablet Take 1.5 tablets (30 mg total) by mouth daily. 45 tablet 1   finasteride  (PROSCAR ) 5 MG tablet Take 5 mg by mouth daily.     hydrocortisone  2.5 % cream Apply topically 2 (two) times daily as needed. (Patient taking differently: Apply 1 application  topically 2 (two) times daily as needed (rash).) 30 g 1   LORazepam  (ATIVAN ) 0.5 MG tablet Take 1 tablet (0.5 mg total) by mouth 3 (three) times daily as needed for anxiety. 90 tablet 0   omeprazole  (PRILOSEC) 40 MG capsule TAKE 1 CAPSULE BY MOUTH DAILY 100 capsule 1   valsartan  (DIOVAN ) 160 MG tablet TAKE 1 TABLET(160 MG) BY MOUTH DAILY 90 tablet 0   No current facility-administered medications on file prior to visit.    Allergies  Allergen Reactions   Dexlansoprazole     Other reaction(s): Unknown       Physical Exam Vitals requested from patient and listed below if patient had equipment and was able to obtain at home for this virtual visit: There were no vitals filed for this visit. Estimated body mass index is 22.11 kg/m as calculated from the following:   Height as of 03/14/24: 5\' 6"  (1.676 m).   Weight as of  03/14/24: 137 lb (62.1 kg).  EKG (optional): deferred due to virtual visit  GENERAL: alert, oriented, no acute distress detected; full vision exam deferred due to pandemic and/or virtual encounter   HEENT: atraumatic, conjunttiva clear, no obvious abnormalities on inspection of external nose and ears  NECK: normal movements of the head and neck  LUNGS: on inspection no signs of respiratory distress, breathing rate appears normal, no obvious gross SOB, gasping or wheezing  CV: no obvious cyanosis  MS: moves all visible extremities without noticeable abnormality  PSYCH/NEURO: pleasant and cooperative, no obvious depression or anxiety, speech and thought processing grossly intact, Cognitive function grossly intact  Flowsheet Row Clinical Support from 03/21/2024 in [facility]  PHQ-9 Total Score 7           03/21/2024    3:14 PM 01/04/2024    1:49 PM 06/23/2023    4:03 PM 12/06/2022    5:04 PM 07/08/2022    9:32 AM  Depression screen PHQ 2/9  Decreased Interest 1  0 0   Down, Depressed, Hopeless 2  0 2   PHQ - 2 Score 3  0 2   Altered sleeping 0  2 0   Tired, decreased energy 2  2 0   Change in appetite 0  0 2   Feeling bad or failure about yourself  0  0 0   Trouble concentrating 2  0 3   Moving slowly or fidgety/restless 0  0 0   Suicidal thoughts 0  0 0   PHQ-9 Score 7  4 7    Difficult doing work/chores Not difficult at all  Somewhat difficult Not difficult at all      Information is confidential and restricted. Go to Review Flowsheets to unlock data.       11/23/2021   10:01 AM 11/21/2022    8:22 AM 12/06/2022  5:04 PM 06/23/2023    7:32 AM 03/21/2024    3:13 PM  Fall Risk  Falls in the past year? 0 0 0 0 0  Was there an injury with Fall? 0 0 0  0  Fall Risk Category Calculator 0  0  0  Fall Risk Category (Retired) Low      (RETIRED) Patient Fall Risk Level Low fall risk      Patient at Risk for Falls Due to     No Fall Risks  Fall  risk Follow up     Falls evaluation completed     SUMMARY AND PLAN:  Encounter for Medicare annual wellness exam   Discussed applicable health maintenance/preventive health measures and advised and referred or ordered per patient preferences: -discussed vaccines due recs and risks, advised can get at the pharmacy if wishes to do -he says he is sure he had the 2 dose shingrix at a pharmacy and agrees to try to get a record Health Maintenance  Topic Date Due   COVID-19 Vaccine (5 - 2024-25 season) 07/02/2023   DTaP/Tdap/Td (3 - Tdap) 10/15/2023   INFLUENZA VACCINE  05/31/2024   Medicare Annual Wellness (AWV)  03/21/2025   Pneumonia Vaccine 85+ Years old  Completed   HPV VACCINES  Aged Out   Meningococcal B Vaccine  Aged Out   Zoster Vaccines- Shingrix  Discontinued     Education and counseling on the following was provided based on the above review of health and a plan/checklist for the patient, along with additional information discussed, was provided for the patient in the patient instructions :   -no falls but admits to poor balance at times.  Reviewed and demonstrated safe balance exercises that can be done at home to improve balance and discussed exercise guidelines for adults with include balance exercises at least 3 days per week. Discussed fall prevention.  -he sees psych for management of anxiety and depression -Advised and counseled on a healthy lifestyle - including the importance of a healthy diet, regular physical activity, social connections and stress management. Focused recs provided on lifestyle interventions to supplement the treatment of anxiety and depression.  -Reviewed patient's current diet. Advised and counseled on a whole foods based healthy diet. A summary of a healthy diet was provided in the Patient Instructions.  -reviewed patient's current physical activity level and discussed exercise guidelines for adults. Discussed community resources and ideas for safe  exercise at home to assist in meeting exercise guideline recommendations in a safe and healthy way. He agrees to start with 3 minutes of exercise a day and build slowly from there.  -Advise yearly dental visits at minimum and regular eye exams   Follow up: see patient instructions   Patient Instructions  I really enjoyed getting to talk with you today! I am available on Tuesdays and Thursdays for virtual visits if you have any questions or concerns, or if I can be of any further assistance.   CHECKLIST FROM ANNUAL WELLNESS VISIT:  -Follow up (please call to schedule if not scheduled after visit):   -yearly for annual wellness visit with primary care office  Here is a list of your preventive care/health maintenance measures and the plan for each if any are due:  PLAN For any measures below that may be due:   If you can get the record from the Shingrix vaccine for us  to update your record, that would be great.  2. Can get the Tdap and the Covid vaccines  at the pharmacy if you wish. Please let us  know if you do so that we can update your record.  Health Maintenance  Topic Date Due   COVID-19 Vaccine (5 - 2024-25 season) 07/02/2023   DTaP/Tdap/Td (3 - Tdap) 10/15/2023   INFLUENZA VACCINE  05/31/2024   Medicare Annual Wellness (AWV)  03/21/2025   Pneumonia Vaccine 42+ Years old  Completed   HPV VACCINES  Aged Out   Meningococcal B Vaccine  Aged Out   Zoster Vaccines- Shingrix  Discontinued    -See a dentist at least yearly  -Get your eyes checked and then per your eye specialist's recommendations  -Other issues addressed today:   1.Goal to add some exercise. Start with 3-5 minutes daily of walking and the balance exercises. Build gradually from there. A good goal would be to try to eventually get to 20-30 minutes per day.    2. Eat healthy healing foods. See below.    -I have included below further information regarding a healthy whole foods based diet, physical activity  guidelines for adults, stress management and opportunities for social connections. I hope you find this information useful.   -----------------------------------------------------------------------------------------------------------------------------------------------------------------------------------------------------------------------------------------------------------    NUTRITION: -eat real food: lots of colorful vegetables (half the plate) and fruits -5-7 servings of vegetables and fruits per day (fresh or steamed is best), exp. 2 servings of vegetables with lunch and dinner and 2 servings of fruit per day. Berries and greens such as kale and collards are great choices.  -consume on a regular basis:  fresh fruits, fresh veggies, fish, nuts, seeds, healthy oils (such as olive oil, avocado oil), whole grains (make sure for bread/pasta/crackers/etc., that the first ingredient on label contains the word "whole"), legumes. -can eat small amounts of dairy and lean meat (no larger than the palm of your hand), but avoid processed meats such as ham, bacon, lunch meat, etc. -drink water  -try to avoid fast food and pre-packaged foods, processed meat, ultra processed foods/beverages (donuts, candy, etc.) -most experts advise limiting sodium to < 2300mg  per day, should limit further is any chronic conditions such as high blood pressure, heart disease, diabetes, etc. The American Heart Association advised that < 1500mg  is is ideal -try to avoid foods/beverages that contain any ingredients with names you do not recognize  -try to avoid foods/beverages  with added sugar or sweeteners/sweets  -try to avoid sweet drinks (including diet drinks): soda, juice, Gatorade, sweet tea, power drinks, diet drinks -try to avoid white rice, white bread, pasta (unless whole grain)  EXERCISE GUIDELINES FOR ADULTS: -if you wish to increase your physical activity, do so gradually and with the approval of your  doctor -STOP and seek medical care immediately if you have any chest pain, chest discomfort or trouble breathing when starting or increasing exercise  -move and stretch your body, legs, feet and arms when sitting for long periods -Physical activity guidelines for optimal health in adults: -get at least 150 minutes per week of moderate exercise (can talk, but not sing); this is about 20-30 minutes of sustained activity 5-7 days per week or two 10-15 minute episodes of sustained activity 5-7 days per week -do some muscle building/resistance training/strength training at least 2 days per week  -balance exercises 3+ days per week:   Stand somewhere where you have something sturdy to hold onto if you lose balance    1) lift up on toes, then back down, start with 5x per day and work up to 20x   2) stand  and lift one leg straight out to the side so that foot is a few inches of the floor, start with 5x each side and work up to 20x each side   3) stand on one foot, start with 5 seconds each side and work up to 20 seconds on each side  If you need ideas or help with getting more active:  -Silver sneakers https://tools.silversneakers.com  -Walk with a Doc: http://www.duncan-williams.com/  -try to include resistance (weight lifting/strength building) and balance exercises twice per week: or the following link for ideas: http://castillo-powell.com/  BuyDucts.dk  STRESS MANAGEMENT: -can try meditating, or just sitting quietly with deep breathing while intentionally relaxing all parts of your body for 5 minutes daily -if you need further help with stress, anxiety or depression please follow up with your primary doctor or contact the wonderful folks at [facility]: [phone]  SOCIAL CONNECTIONS: -options in [place] if you wish to engage in more social and exercise related activities:  -Silver  sneakers https://tools.silversneakers.com  -Walk with a Doc: http://www.duncan-williams.com/  -Check out the [facility] Active Adults 50+ section on the [place] [employer] (hiking clubs, book clubs, cards and games, chess, exercise classes, aquatic classes and much more) - see the website for details: https://www.[place].gov/departments/parks-recreation/active-adults50  -YouTube has lots of exercise videos for different ages and abilities as well  -[facility] (a variety of indoor and outdoor inperson activities for adults). [phone]. [address]  -Virtual Online Classes (a variety of topics): see seniorplanet.org or call [phone]  -consider volunteering at a school, hospice center, church, senior center or elsewhere            Maurie Southern, DO

## 2024-03-21 NOTE — Progress Notes (Signed)
 Patient unable to obtain vital signs due to telehealth visit

## 2024-03-21 NOTE — Patient Instructions (Addendum)
 I really enjoyed getting to talk with you today! I am available on Tuesdays and Thursdays for virtual visits if you have any questions or concerns, or if I can be of any further assistance.   CHECKLIST FROM ANNUAL WELLNESS VISIT:  -Follow up (please call to schedule if not scheduled after visit):   -yearly for annual wellness visit with primary care office  Here is a list of your preventive care/health maintenance measures and the plan for each if any are due:  PLAN For any measures below that may be due:   If you can get the record from the Shingrix vaccine for us  to update your record, that would be great.  2. Can get the Tdap and the Covid vaccines at the pharmacy if you wish. Please let us  know if you do so that we can update your record.  Health Maintenance  Topic Date Due   COVID-19 Vaccine (5 - 2024-25 season) 07/02/2023   DTaP/Tdap/Td (3 - Tdap) 10/15/2023   INFLUENZA VACCINE  05/31/2024   Medicare Annual Wellness (AWV)  03/21/2025   Pneumonia Vaccine 73+ Years old  Completed   HPV VACCINES  Aged Out   Meningococcal B Vaccine  Aged Out   Zoster Vaccines- Shingrix  Discontinued    -See a dentist at least yearly  -Get your eyes checked and then per your eye specialist's recommendations  -Other issues addressed today:   1.Goal to add some exercise. Start with 3-5 minutes daily of walking and the balance exercises. Build gradually from there. A good goal would be to try to eventually get to 20-30 minutes per day.    2. Eat healthy healing foods. See below.    -I have included below further information regarding a healthy whole foods based diet, physical activity guidelines for adults, stress management and opportunities for social connections. I hope you find this information useful.    -----------------------------------------------------------------------------------------------------------------------------------------------------------------------------------------------------------------------------------------------------------    NUTRITION: -eat real food: lots of colorful vegetables (half the plate) and fruits -5-7 servings of vegetables and fruits per day (fresh or steamed is best), exp. 2 servings of vegetables with lunch and dinner and 2 servings of fruit per day. Berries and greens such as kale and collards are great choices.  -consume on a regular basis:  fresh fruits, fresh veggies, fish, nuts, seeds, healthy oils (such as olive oil, avocado oil), whole grains (make sure for bread/pasta/crackers/etc., that the first ingredient on label contains the word "whole"), legumes. -can eat small amounts of dairy and lean meat (no larger than the palm of your hand), but avoid processed meats such as ham, bacon, lunch meat, etc. -drink water  -try to avoid fast food and pre-packaged foods, processed meat, ultra processed foods/beverages (donuts, candy, etc.) -most experts advise limiting sodium to < 2300mg  per day, should limit further is any chronic conditions such as high blood pressure, heart disease, diabetes, etc. The American Heart Association advised that < 1500mg  is is ideal -try to avoid foods/beverages that contain any ingredients with names you do not recognize  -try to avoid foods/beverages  with added sugar or sweeteners/sweets  -try to avoid sweet drinks (including diet drinks): soda, juice, Gatorade, sweet tea, power drinks, diet drinks -try to avoid white rice, white bread, pasta (unless whole grain)  EXERCISE GUIDELINES FOR ADULTS: -if you wish to increase your physical activity, do so gradually and with the approval of your doctor -STOP and seek medical care immediately if you have any chest pain, chest discomfort or trouble breathing when  starting or  increasing exercise  -move and stretch your body, legs, feet and arms when sitting for long periods -Physical activity guidelines for optimal health in adults: -get at least 150 minutes per week of moderate exercise (can talk, but not sing); this is about 20-30 minutes of sustained activity 5-7 days per week or two 10-15 minute episodes of sustained activity 5-7 days per week -do some muscle building/resistance training/strength training at least 2 days per week  -balance exercises 3+ days per week:   Stand somewhere where you have something sturdy to hold onto if you lose balance    1) lift up on toes, then back down, start with 5x per day and work up to 20x   2) stand and lift one leg straight out to the side so that foot is a few inches of the floor, start with 5x each side and work up to 20x each side   3) stand on one foot, start with 5 seconds each side and work up to 20 seconds on each side  If you need ideas or help with getting more active:  -Silver sneakers https://tools.silversneakers.com  -Walk with a Doc: http://www.duncan-williams.com/  -try to include resistance (weight lifting/strength building) and balance exercises twice per week: or the following link for ideas: http://castillo-powell.com/  BuyDucts.dk  STRESS MANAGEMENT: -can try meditating, or just sitting quietly with deep breathing while intentionally relaxing all parts of your body for 5 minutes daily -if you need further help with stress, anxiety or depression please follow up with your primary doctor or contact the wonderful folks at [facility]: [phone]  SOCIAL CONNECTIONS: -options in [place] if you wish to engage in more social and exercise related activities:  -Silver sneakers https://tools.silversneakers.com  -Walk with a Doc: http://www.duncan-williams.com/  -Check out the [facility] Active Adults 50+  section on the [place] of [employer] (hiking clubs, book clubs, cards and games, chess, exercise classes, aquatic classes and much more) - see the website for details: https://www.-[place].gov/departments/parks-recreation/active-adults50  -YouTube has lots of exercise videos for different ages and abilities as well  -[facility] (a variety of indoor and outdoor inperson activities for adults). [phone]. [address].  -Virtual Online Classes (a variety of topics): see seniorplanet.org or call [phone]  -consider volunteering at a school, hospice center, church, senior center or elsewhere

## 2024-04-05 ENCOUNTER — Other Ambulatory Visit (HOSPITAL_COMMUNITY): Payer: Self-pay | Admitting: Psychiatry

## 2024-04-05 DIAGNOSIS — F332 Major depressive disorder, recurrent severe without psychotic features: Secondary | ICD-10-CM

## 2024-04-05 DIAGNOSIS — F419 Anxiety disorder, unspecified: Secondary | ICD-10-CM

## 2024-04-10 ENCOUNTER — Telehealth (HOSPITAL_COMMUNITY): Payer: Self-pay | Admitting: *Deleted

## 2024-04-10 MED ORDER — ARIPIPRAZOLE 2 MG PO TABS: 2.0000 mg | ORAL_TABLET | Freq: Every day | ORAL | 0 refills | Status: DC

## 2024-04-12 ENCOUNTER — Other Ambulatory Visit (HOSPITAL_COMMUNITY): Payer: Self-pay | Admitting: Psychiatry

## 2024-04-12 ENCOUNTER — Other Ambulatory Visit: Payer: Self-pay | Admitting: Family Medicine

## 2024-04-15 NOTE — Telephone Encounter (Signed)
 Erroneous encounter

## 2024-04-23 ENCOUNTER — Other Ambulatory Visit: Payer: Self-pay | Admitting: Family Medicine

## 2024-04-25 ENCOUNTER — Other Ambulatory Visit (HOSPITAL_COMMUNITY): Payer: Self-pay | Admitting: *Deleted

## 2024-04-25 DIAGNOSIS — F419 Anxiety disorder, unspecified: Secondary | ICD-10-CM

## 2024-04-25 MED ORDER — LORAZEPAM 0.5 MG PO TABS: 0.5000 mg | ORAL_TABLET | Freq: Three times a day (TID) | ORAL | 0 refills | Status: DC | PRN

## 2024-05-06 ENCOUNTER — Other Ambulatory Visit (HOSPITAL_COMMUNITY): Payer: Self-pay | Admitting: Psychiatry

## 2024-05-13 ENCOUNTER — Other Ambulatory Visit (HOSPITAL_COMMUNITY): Payer: Self-pay

## 2024-05-13 MED ORDER — ARIPIPRAZOLE 2 MG PO TABS: 2.0000 mg | ORAL_TABLET | Freq: Every day | ORAL | 0 refills | Status: DC

## 2024-05-14 ENCOUNTER — Other Ambulatory Visit (HOSPITAL_COMMUNITY): Payer: Self-pay

## 2024-05-14 MED ORDER — ARIPIPRAZOLE 5 MG PO TABS: 5.0000 mg | ORAL_TABLET | Freq: Every day | ORAL | 0 refills | Status: DC

## 2024-05-22 DIAGNOSIS — H5010 Unspecified exotropia: Secondary | ICD-10-CM | POA: Diagnosis not present

## 2024-05-22 DIAGNOSIS — H52203 Unspecified astigmatism, bilateral: Secondary | ICD-10-CM | POA: Diagnosis not present

## 2024-05-22 DIAGNOSIS — H2513 Age-related nuclear cataract, bilateral: Secondary | ICD-10-CM | POA: Diagnosis not present

## 2024-05-23 ENCOUNTER — Encounter (HOSPITAL_COMMUNITY): Payer: Self-pay | Admitting: Psychiatry

## 2024-05-23 ENCOUNTER — Telehealth (HOSPITAL_COMMUNITY): Admitting: Psychiatry

## 2024-05-23 VITALS — Wt 135.0 lb

## 2024-05-23 DIAGNOSIS — F419 Anxiety disorder, unspecified: Secondary | ICD-10-CM

## 2024-05-23 DIAGNOSIS — F332 Major depressive disorder, recurrent severe without psychotic features: Secondary | ICD-10-CM | POA: Diagnosis not present

## 2024-05-23 MED ORDER — LORAZEPAM 0.5 MG PO TABS: 0.5000 mg | ORAL_TABLET | Freq: Three times a day (TID) | ORAL | 1 refills | Status: DC | PRN

## 2024-05-23 MED ORDER — ESCITALOPRAM OXALATE 20 MG PO TABS: 30.0000 mg | ORAL_TABLET | Freq: Every day | ORAL | 1 refills | Status: DC

## 2024-05-23 MED ORDER — ARIPIPRAZOLE 5 MG PO TABS: 2.5000 mg | ORAL_TABLET | Freq: Every day | ORAL | 0 refills | Status: DC

## 2024-05-23 NOTE — Progress Notes (Signed)
 [facility] MD Virtual Progress Note   Patient Location: Home Provider Location: Home Office  I connect with patient by telephone and verified that I am speaking with correct person by using two identifiers. I discussed the limitations of evaluation and management by telemedicine and the availability of in person appointments. I also discussed with the patient that there may be a patient responsible charge related to this service. The patient expressed understanding and agreed to proceed.  [name] [id number] 88 y.o.  05/23/2024 9:26 AM  History of Present Illness:  Patient is evaluated by phone session.  His visit today was changed to virtual.  He reported things were going better until recently he noticed lack of motivation to do things.  He started taking Abilify  5 mg 10 days ago.  He is not sure if the higher dose may have caused fatigue, lack of motivation, tired feeling.  He denies any tremors, shakes or any akathisia.  He is taking Lexapro  30 mg and Ativan  0.5 mg most of the time twice a day and very rarely third pill.  Recently had a visit with annual with the PCP.  No new medication added.  Denies any hopelessness or suicidal thoughts.  He sleeps fairly okay.  He does drive and go to doctors appointment.  He denies any hallucination or any paranoia.  He was able to go to [place] to attend her 74 year old granddaughter classical dance performance.  He does watch television until recently he feel no desire.  His appetite is fair.  He lost 2 pounds but no other major concern.  He denies any panic attack or feeling of overwhelmed.  He had a good support from his wife.  Past Psychiatric History: H/O depression and anxiety. Tried Celexa , Pristiq , Remeron , Cymbalta , and Xanax  but limited outcome. Klonopin  caused black stool and Buspar  made groggy.  Had TMS but not helpful.  No h/o suicidal attempt, mania, psychosis or self abusive behavior.  Saw Dr. Gutterman in the past for  therapy.  He had genetic testing and the results are in the chart.  Abilify , Vraylar , Trintellix , Wellbutrin  were not effective.    Past Medical History:  Diagnosis Date   Anxiety    BPH (benign prostatic hypertrophy)    DDD (degenerative disc disease), lumbar 2002   Depression    Difficult intubation 02/22/2019   Anterior with limited oral opening   Essential hypertension 03/10/2019   GERD (gastroesophageal reflux disease)    History of colon polyps    History of hiatal hernia 2012   Hyperlipemia    Mild   Indwelling Foley catheter present    Iron deficiency anemia    IV iron therapy    Mild hyperlipidemia 02/20/2008   Qualifier: Diagnosis of  By: Tammie MD, Bruce     Pre-diabetes    Renal cyst 2008   7.6 cm lower pole right renal cyst   Sleep apnea    slight, no CPAP used    Outpatient Encounter Medications as of 05/23/2024  Medication Sig   acetaminophen  (TYLENOL ) 500 MG tablet Take 1,000 mg by mouth every 6 (six) hours as needed for moderate pain or headache.   amLODipine  (NORVASC ) 5 MG tablet TAKE 1 TABLET(5 MG) BY MOUTH DAILY   ARIPiprazole  (ABILIFY ) 5 MG tablet Take 1 tablet (5 mg total) by mouth daily.   aspirin  EC 81 MG tablet Take 1 tablet (81 mg total) by mouth daily. Swallow whole.   atorvastatin  (LIPITOR) 10 MG tablet Take 1 tablet (  10 mg total) by mouth daily.   clobetasol cream (TEMOVATE) 0.05 % Apply 1 application  topically 2 (two) times daily.   escitalopram  (LEXAPRO ) 20 MG tablet Take 1.5 tablets (30 mg total) by mouth daily.   finasteride  (PROSCAR ) 5 MG tablet Take 5 mg by mouth daily.   hydrocortisone  2.5 % cream Apply topically 2 (two) times daily as needed. (Patient taking differently: Apply 1 application  topically 2 (two) times daily as needed (rash).)   LORazepam  (ATIVAN ) 0.5 MG tablet Take 1 tablet (0.5 mg total) by mouth 3 (three) times daily as needed for anxiety.   omeprazole  (PRILOSEC) 40 MG capsule TAKE 1 CAPSULE BY MOUTH DAILY   valsartan   (DIOVAN ) 160 MG tablet TAKE 1 TABLET(160 MG) BY MOUTH DAILY   No facility-administered encounter medications on file as of 05/23/2024.    No results found for this or any previous visit (from the past 2160 hours).   Psychiatric Specialty Exam: Physical Exam  Review of Systems  There were no vitals taken for this visit.There is no height or weight on file to calculate BMI.  General Appearance: NA  Eye Contact:  NA  Speech:  Slow  Volume:  Decreased  Mood:  Dysphoric and tired  Affect:  NA  Thought Process:  Descriptions of Associations: Intact  Orientation:  Full (Time, Place, and Person)  Thought Content:  Rumination  Suicidal Thoughts:  No  Homicidal Thoughts:  No  Memory:  Immediate;   Good Recent;   Good Remote;   Fair  Judgement:  Intact  Insight:  Present  Psychomotor Activity:  Decreased  Concentration:  Concentration: Fair and Attention Span: Fair  Recall:  Good  Fund of Knowledge:  Good  Language:  Good  Akathisia:  No  Handed:  Right  AIMS (if indicated):     Assets:  Communication Skills Desire for Improvement Housing Social Support Transportation  ADL's:  Intact  Cognition:  WNL  Sleep:  ok       03/21/2024    3:14 PM 01/04/2024    1:49 PM 06/23/2023    4:03 PM 12/06/2022    5:04 PM 07/08/2022    9:32 AM  Depression screen PHQ 2/9  Decreased Interest 1 3 0 0 0  Down, Depressed, Hopeless 2 2 0 2 0  PHQ - 2 Score 3 5 0 2 0  Altered sleeping 0 2 2 0 0  Tired, decreased energy 2 2 2  0 1  Change in appetite 0 0 0 2 0  Feeling bad or failure about yourself  0 1 0 0 0  Trouble concentrating 2 1 0 3 0  Moving slowly or fidgety/restless 0 1 0 0 0  Suicidal thoughts 0 0 0 0 0  PHQ-9 Score 7 12 4 7 1   Difficult doing work/chores Not difficult at all Somewhat difficult Somewhat difficult Not difficult at all     Assessment/Plan: Major depressive disorder, recurrent, severe without psychotic features (HCC) - Plan: escitalopram  (LEXAPRO ) 20 MG tablet,  ARIPiprazole  (ABILIFY ) 5 MG tablet  Anxiety - Plan: escitalopram  (LEXAPRO ) 20 MG tablet, ARIPiprazole  (ABILIFY ) 5 MG tablet, LORazepam  (ATIVAN ) 0.5 MG tablet  Patient is 88 year old married man with history of depression and anxiety.  Recommend to cut down the Abilify  back to 2.5 mg since he noticed symptoms worsening which could be related to higher dose of Abilify .  Patient agreed with the plan.  I recommend if he cannot cut down the Abilify  5 mg in half then we can  provide 2 mg tablet to the pharmacy.  Patient will call us  back if he has any question.  Will keep the Lexapro  30 mg daily and Ativan  0.5 mg up to 3 times a day as needed.  He also interested in therapy.  In the past he has seen Dr. Gutterman.  He would like to try a new therapist.  We will provide therapy referral for CBT.  Encouraged to call back if any question.  Will follow-up in 2 months.   Follow Up Instructions:     I discussed the assessment and treatment plan with the patient. The patient was provided an opportunity to ask questions and all were answered. The patient agreed with the plan and demonstrated an understanding of the instructions.   The patient was advised to call back or seek an in-person evaluation if the symptoms worsen or if the condition fails to improve as anticipated.    Collaboration of Care: Other provider involved in patient's care AEB notes are available in epic to review.  Patient/Guardian was advised Release of Information must be obtained prior to any record release in order to collaborate their care with an outside provider. Patient/Guardian was advised if they have not already done so to contact the registration department to sign all necessary forms in order for us  to release information regarding their care.   Consent: Patient/Guardian gives verbal consent for treatment and assignment of benefits for services provided during this visit. Patient/Guardian expressed understanding and agreed to  proceed.     Total encounter time 18 minutes which includes face-to-face time, chart reviewed, care coordination, order entry and documentation during this encounter.   Note: This document was prepared by [employer] voice dictation technology and any errors that results from this process are unintentional.    Leni ONEIDA Client, MD 05/23/2024

## 2024-07-04 ENCOUNTER — Ambulatory Visit (HOSPITAL_COMMUNITY): Admitting: Psychiatry

## 2024-07-04 ENCOUNTER — Other Ambulatory Visit: Payer: Self-pay

## 2024-07-04 ENCOUNTER — Encounter (HOSPITAL_COMMUNITY): Payer: Self-pay | Admitting: Psychiatry

## 2024-07-04 VITALS — BP 136/67 | HR 71 | Ht 66.0 in | Wt 135.0 lb

## 2024-07-04 DIAGNOSIS — F332 Major depressive disorder, recurrent severe without psychotic features: Secondary | ICD-10-CM | POA: Diagnosis not present

## 2024-07-04 DIAGNOSIS — F419 Anxiety disorder, unspecified: Secondary | ICD-10-CM | POA: Diagnosis not present

## 2024-07-04 MED ORDER — ESCITALOPRAM OXALATE 20 MG PO TABS: 30.0000 mg | ORAL_TABLET | Freq: Every day | ORAL | 1 refills | Status: DC

## 2024-07-04 MED ORDER — LORAZEPAM 0.5 MG PO TABS: 0.5000 mg | ORAL_TABLET | Freq: Three times a day (TID) | ORAL | 1 refills | Status: DC | PRN

## 2024-07-04 MED ORDER — ARIPIPRAZOLE 5 MG PO TABS: 5.0000 mg | ORAL_TABLET | Freq: Every day | ORAL | 1 refills | Status: DC

## 2024-07-04 NOTE — Progress Notes (Signed)
 [facility] MD/PA/NP OP Progress Note  07/04/2024 11:13 AM [name]  MRN:  [id number]  Chief Complaint:  Chief Complaint  Patient presents with   Anxiety   Follow-up   Depression   HPI: Patient came today to office for his follow-up appointment with his wife.  Patient experiencing increased anxiety, depression, lack of motivation.  We have cut down the Abilify  dose from 5 mg to 2.5 as patient was complaining of fatigue and tiredness.  Patient noticed symptoms had worsened since the dose reduced.  He does not leave the house unless necessary.  Wife reported recently patient's brother-in-law came from [place]  and they did go to [place] for a day trip.  He used to enjoy playing golf and writing poetry but has not done in a while.  No recent travel to [place].  He denies any hallucination, paranoia, suicidal thoughts.  He denies any anger, agitation.  He sleeps okay.  He is taking Lexapro , Ativan  and Abilify  2.5.  He takes the Ativan  on a daily basis because he gets very anxious when he wake up in the morning.  He had a good support from his wife.  Visit Diagnosis:    ICD-10-CM   1. Major depressive disorder, recurrent, severe without psychotic features (HCC)  F33.2 ARIPiprazole  (ABILIFY ) 5 MG tablet    escitalopram  (LEXAPRO ) 20 MG tablet    2. Anxiety  F41.9 ARIPiprazole  (ABILIFY ) 5 MG tablet    escitalopram  (LEXAPRO ) 20 MG tablet    LORazepam  (ATIVAN ) 0.5 MG tablet      Past Psychiatric History: Reviewed H/O depression and anxiety. Tried Celexa , Pristiq , Remeron , Cymbalta , and Xanax  but limited outcome. Klonopin  caused black stool and Buspar  made groggy.  Had TMS but not helpful.  No h/o suicidal attempt, mania, psychosis or self abusive behavior.  Saw Dr. Gutterman in the past for therapy.  He had genetic testing and the results are in the chart.  Abilify , Vraylar , Trintellix , Wellbutrin  were not effective.   Past Medical History:  Past Medical History:  Diagnosis Date   Anxiety    BPH  (benign prostatic hypertrophy)    DDD (degenerative disc disease), lumbar 2002   Depression    Difficult intubation 02/22/2019   Anterior with limited oral opening   Essential hypertension 03/10/2019   GERD (gastroesophageal reflux disease)    History of colon polyps    History of hiatal hernia 2012   Hyperlipemia    Mild   Indwelling Foley catheter present    Iron deficiency anemia    IV iron therapy    Mild hyperlipidemia 02/20/2008   Qualifier: Diagnosis of  By: Tammie MD, Bruce     Pre-diabetes    Renal cyst 2008   7.6 cm lower pole right renal cyst   Sleep apnea    slight, no CPAP used    Past Surgical History:  Procedure Laterality Date   COLONOSCOPY  08/2011   COLONOSCOPY WITH PROPOFOL  N/A 06/09/2023   Procedure: COLONOSCOPY WITH PROPOFOL ;  Surgeon: Rollin Dover, MD;  Location: [facility];  Service: Gastroenterology;  Laterality: N/A;   ESOPHAGOGASTRODUODENOSCOPY (EGD) WITH PROPOFOL  N/A 06/09/2023   Procedure: ESOPHAGOGASTRODUODENOSCOPY (EGD) WITH PROPOFOL ;  Surgeon: Rollin Dover, MD;  Location: [facility];  Service: Gastroenterology;  Laterality: N/A;   NASAL SEPTOPLASTY W/ TURBINOPLASTY  08/03/2012   Procedure: NASAL SEPTOPLASTY WITH TURBINATE REDUCTION;  Surgeon: Ida Loader, MD;  Location[facility];  Service: ENT;  Laterality: Bilateral;   POLYPECTOMY  06/09/2023   Procedure: POLYPECTOMY;  Surgeon: Rollin Dover,  MD;  Location: [facility];  Service: Gastroenterology;;   UPPER GASTROINTESTINAL ENDOSCOPY  08/2011   XI ROBOTIC ASSISTED SIMPLE PROSTATECTOMY N/A 02/22/2019   Procedure: XI ROBOTIC ASSISTED SIMPLE PROSTATECTOMY;  Surgeon: Alvaro Hummer, MD;  Location: [facility];  Service: Urology;  Laterality: N/A;    Family Psychiatric History: Reviewed  Family History:  Family History  Problem Relation Age of Onset   Heart attack Father    Hypertension Father    Hypertension Sister    Hypertension Brother    Hypertension Brother    Hypertension Daughter     Social  History:  Social History   Socioeconomic History   Marital status: Married    Spouse name: Not on file   Number of children: 2   Years of education: Not on file   Highest education level: Master's degree (e.g., MA, MS, MEng, MEd, MSW, MBA)  Occupational History   Not on file  Tobacco Use   Smoking status: Former    Current packs/day: 0.00    Average packs/day: 0.5 packs/day for 10.0 years (5.0 ttl pk-yrs)    Types: Cigarettes    Start date: 44    Quit date: 1969    Years since quitting: 56.7   Smokeless tobacco: Never   Tobacco comments:    rare use over 50 years   Vaping Use   Vaping status: Never Used  Substance and Sexual Activity   Alcohol use: Yes    Alcohol/week: 1.0 standard drink of alcohol    Types: 1 Shots of liquor per week    Comment: weekly   Drug use: No   Sexual activity: Never    Birth control/protection: None  Other Topics Concern   Not on file  Social History Narrative   Lives in 2 level    Will stay for now    Just moved here approx 1.5 yo   Very nice neighbors    [employer]  x 5 with a very busy holiday   One son is a Therapist, sports and nephew is a Counsellor    Son in [place] moved to [place] and he misses his grand children    As 2 brothers in [place]       Retired from [employer], [employer]       Social Drivers of Corporate investment banker Strain: Low Risk  (03/21/2024)   Overall Financial Resource Strain (CARDIA)    Difficulty of Paying Living Expenses: Not hard at all  Food Insecurity: No Food Insecurity (03/21/2024)   Hunger Vital Sign    Worried About Running Out of Food in the Last Year: Never true    Ran Out of Food in the Last Year: Never true  Transportation Needs: No Transportation Needs (03/21/2024)   PRAPARE - Administrator, Civil Service (Medical): No    Lack of Transportation (Non-Medical): No  Physical Activity: Inactive (03/21/2024)   Exercise Vital Sign    Days of Exercise per Week: 0 days    Minutes of Exercise per  Session: 0 min  Stress: Stress Concern Present (03/21/2024)   [employer] of Occupational Health - Occupational Stress Questionnaire    Feeling of Stress : Very much  Social Connections: Moderately Isolated (03/21/2024)   Social Connection and Isolation Panel    Frequency of Communication with Friends and Family: Twice a week    Frequency of Social Gatherings with Friends and Family: Once a week    Attends Religious Services: Never    Active  Member of Clubs or Organizations: No    Attends Banker Meetings: Never    Marital Status: Married    Allergies:  Allergies  Allergen Reactions   Dexlansoprazole     Other reaction(s): Unknown    Metabolic Disorder Labs: Lab Results  Component Value Date   HGBA1C 6.1 06/03/2022   MPG 116.89 02/20/2019   No results found for: PROLACTIN Lab Results  Component Value Date   CHOL 130 06/03/2022   TRIG 48.0 06/03/2022   HDL 57.60 06/03/2022   CHOLHDL 2 06/03/2022   VLDL 9.6 06/03/2022   LDLCALC 63 06/03/2022   LDLCALC 112 (H) 02/17/2021   Lab Results  Component Value Date   TSH 3.94 06/23/2023   TSH 3.02 02/17/2021    Therapeutic Level Labs: No results found for: LITHIUM No results found for: VALPROATE No results found for: CBMZ  Current Medications: Current Outpatient Medications  Medication Sig Dispense Refill   acetaminophen  (TYLENOL ) 500 MG tablet Take 1,000 mg by mouth every 6 (six) hours as needed for moderate pain or headache.     amLODipine  (NORVASC ) 5 MG tablet TAKE 1 TABLET(5 MG) BY MOUTH DAILY 90 tablet 1   ARIPiprazole  (ABILIFY ) 5 MG tablet Take 0.5 tablets (2.5 mg total) by mouth daily. 30 tablet 0   aspirin  EC 81 MG tablet Take 1 tablet (81 mg total) by mouth daily. Swallow whole. 90 tablet 3   atorvastatin  (LIPITOR) 10 MG tablet Take 1 tablet (10 mg total) by mouth daily. 90 tablet 3   clobetasol cream (TEMOVATE) 0.05 % Apply 1 application  topically 2 (two) times daily.     escitalopram   (LEXAPRO ) 20 MG tablet Take 1.5 tablets (30 mg total) by mouth daily. 45 tablet 1   finasteride  (PROSCAR ) 5 MG tablet Take 5 mg by mouth daily.     hydrocortisone  2.5 % cream Apply topically 2 (two) times daily as needed. (Patient taking differently: Apply 1 application  topically 2 (two) times daily as needed (rash).) 30 g 1   LORazepam  (ATIVAN ) 0.5 MG tablet Take 1 tablet (0.5 mg total) by mouth 3 (three) times daily as needed for anxiety. 90 tablet 1   omeprazole  (PRILOSEC) 40 MG capsule TAKE 1 CAPSULE BY MOUTH DAILY 100 capsule 1   valsartan  (DIOVAN ) 160 MG tablet TAKE 1 TABLET(160 MG) BY MOUTH DAILY 90 tablet 0   No current facility-administered medications for this visit.     Musculoskeletal: Strength & Muscle Tone: within normal limits Gait & Station: normal Patient leans: N/A  Psychiatric Specialty Exam: Review of Systems  Blood pressure 136/67, pulse 71, height 5' 6 (1.676 m), weight 135 lb (61.2 kg).Body mass index is 21.79 kg/m.  General Appearance: Casual  Eye Contact:  Good  Speech:  Normal Rate  Volume:  Normal  Mood:  Anxious, Depressed, and Dysphoric  Affect:  Constricted  Thought Process:  Goal Directed  Orientation:  Full (Time, Place, and Person)  Thought Content: Rumination   Suicidal Thoughts:  No  Homicidal Thoughts:  No  Memory:  Immediate;   Good Recent;   Good Remote;   Good  Judgement:  Intact  Insight:  Present  Psychomotor Activity:  Decreased  Concentration:  Concentration: Good and Attention Span: Good  Recall:  Good  Fund of Knowledge: Good  Language: Good  Akathisia:  No  Handed:  Right  AIMS (if indicated): not done  Assets:  Communication Skills Desire for Improvement Housing Social Support Transportation  ADL's:  Intact  Cognition: WNL  Sleep:  Fair   Screenings: AUDIT    Garment/textile technologist Visit from 06/23/2023 in [facility] [place] Clinical Support from 12/14/2020 in [facility] [place]  Alcohol Use Disorder Identification Test Final Score (AUDIT) 3  3   GAD-7    Flowsheet Row Office Visit from 05/17/2022 in [facility] Clinical Support from 05/21/2021 in [facility] Clinical Support from 04/27/2021 in [facility]  Total GAD-7 Score 12 1 2    PHQ2-9    Flowsheet Row Clinical Support from 03/21/2024 in [facility] [place] Office Visit from 01/04/2024 in [facility] Office Visit from 06/23/2023 in [facility] [place] Video Visit from 12/06/2022 in [facility] [place] Clinical Support from 07/08/2022 in [facility]  PHQ-2 Total Score 3 5 0 2 0  PHQ-9 Total Score 7 12 4 7 1    Flowsheet Row Admission (Discharged) from 06/09/2023 in [facility] Video Visit from 08/29/2022 in [facility] Video Visit from 11/30/2021 in [facility]  C-SSRS RISK CATEGORY No Risk Error: Question 6 not populated No Risk     Assessment and Plan: Patient is a 88 year old married man with history of severe depression anxiety.  Review previous notes and prior trial of medication.  Recommend to try going back on Abilify  5 mg which was helping but he was feeling fatigue and we cut down the dose to 2.5.  I also encouraged to consider therapy.  Patient used to see Dr. Gutterman however has not seen in a while.  We have recommended to see Donnice Oz but patient tried calling the office but seems like he has difficulty reaching to the office or maybe the therapist is not working anymore.  I encouraged to go back to Dr. Gutterman and discuss about motivational therapy.  Her biggest concern is lack of motivation.  For now keep the Lexapro  30 mg daily, Ativan  0.5 mg up to 3 times a day and he  will try Abilify  5 mg.  I also discussed considering ECT again.  He had TMS with poor response.  Patient and his wife like to have a ECT in [facility] rather than going to [facility] or [facility] due to driving distance.  Patient's wife does not drive on the highway.  I recommend consider having a future ECT treatment if above recommendation did not work.  Discussed medication side effects and benefits.  Recommend to call back if he has any question or any concern.  Follow-up in 2 months.  Collaboration of Care: Collaboration of Care: Other provider involved in patient's care AEB notes are available in epic to review  Patient/Guardian was advised Release of Information must be obtained prior to any record release in order to collaborate their care with an outside provider. Patient/Guardian was advised if they have not already done so to contact the registration department to sign all necessary forms in order for us  to release information regarding their care.   Consent: Patient/Guardian gives verbal consent for treatment and assignment of benefits for services provided during this visit. Patient/Guardian expressed understanding and agreed to proceed.    Leni ONEIDA Client, MD 07/04/2024, 11:13 AM

## 2024-07-12 ENCOUNTER — Telehealth (HOSPITAL_COMMUNITY): Payer: Self-pay | Admitting: *Deleted

## 2024-07-12 NOTE — Telephone Encounter (Signed)
 Referrals for ECT sent to [facility] and [facility]. Pt advised and verbalizes understanding.

## 2024-07-21 ENCOUNTER — Other Ambulatory Visit: Payer: Self-pay | Admitting: Family Medicine

## 2024-07-22 ENCOUNTER — Other Ambulatory Visit: Payer: Self-pay | Admitting: Family Medicine

## 2024-08-02 ENCOUNTER — Ambulatory Visit: Admitting: Family Medicine

## 2024-08-02 ENCOUNTER — Ambulatory Visit: Payer: Self-pay | Admitting: Family Medicine

## 2024-08-02 VITALS — BP 128/60 | HR 61 | Temp 98.3°F | Ht 66.0 in | Wt 135.6 lb

## 2024-08-02 DIAGNOSIS — R739 Hyperglycemia, unspecified: Secondary | ICD-10-CM | POA: Diagnosis not present

## 2024-08-02 DIAGNOSIS — I10 Essential (primary) hypertension: Secondary | ICD-10-CM

## 2024-08-02 DIAGNOSIS — Z01818 Encounter for other preprocedural examination: Secondary | ICD-10-CM | POA: Diagnosis not present

## 2024-08-02 DIAGNOSIS — F339 Major depressive disorder, recurrent, unspecified: Secondary | ICD-10-CM

## 2024-08-02 DIAGNOSIS — R634 Abnormal weight loss: Secondary | ICD-10-CM

## 2024-08-02 DIAGNOSIS — E785 Hyperlipidemia, unspecified: Secondary | ICD-10-CM

## 2024-08-02 LAB — CBC WITH DIFFERENTIAL/PLATELET
Basophils Absolute: 0 K/uL (ref 0.0–0.1)
Basophils Relative: 0.6 % (ref 0.0–3.0)
Eosinophils Absolute: 0.2 K/uL (ref 0.0–0.7)
Eosinophils Relative: 3 % (ref 0.0–5.0)
HCT: 36.8 % — ABNORMAL LOW (ref 39.0–52.0)
Hemoglobin: 12 g/dL — ABNORMAL LOW (ref 13.0–17.0)
Lymphocytes Relative: 29.5 % (ref 12.0–46.0)
Lymphs Abs: 1.6 K/uL (ref 0.7–4.0)
MCHC: 32.6 g/dL (ref 30.0–36.0)
MCV: 78.5 fl (ref 78.0–100.0)
Monocytes Absolute: 0.6 K/uL (ref 0.1–1.0)
Monocytes Relative: 12 % (ref 3.0–12.0)
Neutro Abs: 2.9 K/uL (ref 1.4–7.7)
Neutrophils Relative %: 54.9 % (ref 43.0–77.0)
Platelets: 238 K/uL (ref 150.0–400.0)
RBC: 4.69 Mil/uL (ref 4.22–5.81)
RDW: 15.1 % (ref 11.5–15.5)
WBC: 5.3 K/uL (ref 4.0–10.5)

## 2024-08-02 LAB — COMPREHENSIVE METABOLIC PANEL WITH GFR
ALT: 16 U/L (ref 0–53)
AST: 14 U/L (ref 0–37)
Albumin: 4 g/dL (ref 3.5–5.2)
Alkaline Phosphatase: 52 U/L (ref 39–117)
BUN: 18 mg/dL (ref 6–23)
CO2: 27 meq/L (ref 19–32)
Calcium: 9.1 mg/dL (ref 8.4–10.5)
Chloride: 101 meq/L (ref 96–112)
Creatinine, Ser: 0.85 mg/dL (ref 0.40–1.50)
GFR: 78.96 mL/min (ref >60.00)
Glucose, Bld: 104 mg/dL — ABNORMAL HIGH (ref 70–99)
Potassium: 3.6 meq/L (ref 3.5–5.1)
Sodium: 136 meq/L (ref 135–145)
Total Bilirubin: 0.5 mg/dL (ref 0.2–1.2)
Total Protein: 6.9 g/dL (ref 6.0–8.3)

## 2024-08-02 LAB — TSH: TSH: 3.75 u[IU]/mL (ref 0.35–5.50)

## 2024-08-02 LAB — LIPID PANEL
Cholesterol: 135 mg/dL (ref 0–200)
HDL: 62.9 mg/dL (ref >39.00)
LDL Cholesterol: 63 mg/dL (ref 0–99)
NonHDL: 72.3
Total CHOL/HDL Ratio: 2
Triglycerides: 48 mg/dL (ref 0.0–149.0)
VLDL: 9.6 mg/dL (ref 0.0–40.0)

## 2024-08-02 LAB — HEMOGLOBIN A1C: Hgb A1c MFr Bld: 6 % (ref 4.6–6.5)

## 2024-08-02 LAB — VITAMIN D 25 HYDROXY (VIT D DEFICIENCY, FRACTURES): VITD: 19.48 ng/mL — ABNORMAL LOW (ref 30.00–100.00)

## 2024-08-02 LAB — MAGNESIUM: Magnesium: 1.9 mg/dL (ref 1.5–2.5)

## 2024-08-02 NOTE — Progress Notes (Signed)
 Established Patient Office Visit  Subjective   Patient ID: [name], male    DOB: Mar 11, 1936  Age: 88 y.o. MRN: [id number]  Chief Complaint  Patient presents with   Pre-op  Exam    HPI   Mr Yaffe is here for clearance evaluation for ECT therapy.  He is followed by psychiatrist with [facility].  Longstanding history of recurrent depression and resistant depression and they are looking at ECT which he has not had previously.  Patient's past medical history reviewed.  He has history of hypertension, hyperglycemia/prediabetes, GERD, chronic anxiety, mild hyperlipidemia, past history of normocytic anemia.  No history of significant cardiac issues.  He does have known heart murmur and is followed by cardiologist and had echocardiogram last January which showed some aortic valve calcification but only mild stenosis.  Trivial mitral valve regurgitation.  He also had carotid Dopplers last winter which showed no significant ICA stenosis  He does have history of prediabetes.  Last A1c was 6.1%.  Not checked within the past year.  No significant polyuria or polydipsia.  Wife is concerned that he has been losing weight.  She is trying to prepare nutritious meals but he has very little interest in eating much.  Denies any recent headaches, chronic cough, dyspnea, abdominal pain, or change in bowel habits.  Past Medical History:  Diagnosis Date   Anxiety    BPH (benign prostatic hypertrophy)    DDD (degenerative disc disease), lumbar 2002   Depression    Difficult intubation 02/22/2019   Anterior with limited oral opening   Essential hypertension 03/10/2019   GERD (gastroesophageal reflux disease)    History of colon polyps    History of hiatal hernia 2012   Hyperlipemia    Mild   Indwelling Foley catheter present    Iron deficiency anemia    IV iron therapy    Mild hyperlipidemia 02/20/2008   Qualifier: Diagnosis of  By: Tammie MD, Tyjae Issa     Pre-diabetes    Renal cyst 2008   7.6  cm lower pole right renal cyst   Sleep apnea    slight, no CPAP used   Past Surgical History:  Procedure Laterality Date   COLONOSCOPY  08/2011   COLONOSCOPY WITH PROPOFOL  N/A 06/09/2023   Procedure: COLONOSCOPY WITH PROPOFOL ;  Surgeon: Rollin Dover, MD;  [facility]: [facility];  Service: Gastroenterology;  Laterality: N/A;   ESOPHAGOGASTRODUODENOSCOPY (EGD) WITH PROPOFOL  N/A 06/09/2023   Procedure: ESOPHAGOGASTRODUODENOSCOPY (EGD) WITH PROPOFOL ;  Surgeon: Rollin Dover, MD;  Location: [facility];  Service: Gastroenterology;  Laterality: N/A;   NASAL SEPTOPLASTY W/ TURBINOPLASTY  08/03/2012   Procedure: NASAL SEPTOPLASTY WITH TURBINATE REDUCTION;  Surgeon: Ida Loader, MD;  [facility] OR;  Service: ENT;  Laterality: Bilateral;   POLYPECTOMY  06/09/2023   Procedure: POLYPECTOMY;  Surgeon: Rollin Dover, MD;  Location: [facility];  Service: Gastroenterology;;   UPPER GASTROINTESTINAL ENDOSCOPY  08/2011   XI ROBOTIC ASSISTED SIMPLE PROSTATECTOMY N/A 02/22/2019   Procedure: XI ROBOTIC ASSISTED SIMPLE PROSTATECTOMY;  Surgeon: Alvaro Hummer, MD;  Location: [facility];  Service: Urology;  Laterality: N/A;    reports that he quit smoking about 56 years ago. His smoking use included cigarettes. He started smoking about 66 years ago. He has a 5 pack-year smoking history. He has never used smokeless tobacco. He reports current alcohol use of about 1.0 standard drink of alcohol per week. He reports that he does not use drugs. family history includes Heart attack in his father; Hypertension in his  brother, brother, daughter, father, and sister. Allergies  Allergen Reactions   Dexlansoprazole     Other reaction(s): Unknown    Review of Systems  Constitutional:  Positive for weight loss. Negative for fever and malaise/fatigue.  Eyes:  Negative for blurred vision.  Respiratory:  Negative for cough, hemoptysis and shortness of breath.   Cardiovascular:  Negative for chest pain.  Gastrointestinal:   Negative for abdominal pain, blood in stool, diarrhea, nausea and vomiting.  Genitourinary:  Negative for dysuria and hematuria.  Neurological:  Negative for dizziness, weakness and headaches.  Psychiatric/Behavioral:  Positive for depression.       Objective:    BP 128/60 (BP Location: Left Arm, Cuff Size: Normal)   Pulse 61   Temp 98.3 F (36.8 C) (Oral)   Ht 5' 6 (1.676 m)   Wt 135 lb 9.6 oz (61.5 kg)   SpO2 96%   BMI 21.89 kg/m  BP Readings from Last 3 Encounters:  08/02/24 128/60  01/02/24 120/62  06/30/23 122/60   Wt Readings from Last 3 Encounters:  08/02/24 135 lb 9.6 oz (61.5 kg)  01/02/24 143 lb 12.8 oz (65.2 kg)  06/30/23 138 lb 9.6 oz (62.9 kg)      Physical Exam Vitals reviewed.  Constitutional:      Appearance: He is well-developed.  HENT:     Head: Normocephalic and atraumatic.  Eyes:     Pupils: Pupils are equal, round, and reactive to light.  Neck:     Thyroid : No thyromegaly.  Cardiovascular:     Rate and Rhythm: Normal rate and regular rhythm.  Pulmonary:     Effort: Pulmonary effort is normal. No respiratory distress.     Breath sounds: Normal breath sounds. No wheezing or rales.  Musculoskeletal:     Cervical back: Neck supple.     Right lower leg: No edema.     Left lower leg: No edema.  Neurological:     General: No focal deficit present.     Mental Status: He is alert and oriented to person, place, and time.      No results found for any visits on 08/02/24.    The ASCVD Risk score (Arnett DK, et al., 2019) failed to calculate for the following reasons:   The 2019 ASCVD risk score is only valid for ages 27 to 44    Assessment & Plan:   Problem List Items Addressed This Visit       Unprioritized   Hyperglycemia   Relevant Orders   Hemoglobin A1c   Essential hypertension   Depression, recurrent   Relevant Orders   TSH   Mild hyperlipidemia   Relevant Orders   CMP   Lipid panel   Other Visit Diagnoses        Pre-op  exam    -  Primary   Relevant Orders   EKG 12-Lead (Completed)   CBC with Differential/Platelet   Magnesium    VITAMIN D 25 Hydroxy (Vit-D Deficiency, Fractures)     88 year old male here with chronic medical problems as above.  Here for clearance medically for ECT therapy.  He had no recent headaches.  No chest pains.  No significant cardiac history other than hypertension which is currently well-controlled.    - EKG obtained today which shows sinus rhythm with no acute ST-T changes.  No significant change compared to prior tracings - Obtain labs including CBC, CMP, magnesium  level, TSH, A1c - We are not aware of any contraindications to ECT therapy at  this time - We did discuss possible CT abdomen pelvis if weight loss continues and above labs unrevealing especially if weight not increasing with treatment of his depression  No follow-ups on file.    Wolm Scarlet, MD

## 2024-08-06 ENCOUNTER — Telehealth: Payer: Self-pay | Admitting: *Deleted

## 2024-08-06 NOTE — Telephone Encounter (Signed)
 Copied from CRM #[id number]. Topic: General - Other >> Aug 06, 2024  3:56 PM Frederich PARAS wrote: Reason for CRM: mia from  [facility] says they were suppose to get blood work and EKG for the pt last Friday but they still have  not gotten it, please fax it to fax [phone] , callback for mia is [phone]

## 2024-08-06 NOTE — Telephone Encounter (Unsigned)
 Copied from CRM [id number]. Topic: Clinical - Lab/Test Results >> Aug 05, 2024  4:35 PM Winona R wrote: Pt returning call for lab results. Pt understands. Pt would also like know if his lab were sent to [facility] as he has requested. Please give him a call to confirm

## 2024-08-07 ENCOUNTER — Telehealth: Payer: Self-pay

## 2024-08-07 NOTE — Telephone Encounter (Signed)
 Copied from CRM #[id number]. Topic: Clinical - Lab/Test Results >> Aug 06, 2024  2:23 PM Dedra B wrote: Reason for CRM: Pt is calling to follow up on his request to have pre-op  labs results faxed to [facility]. He received a call today saying that they haven't received them. Pt would like to be notified when it has been sent.

## 2024-08-07 NOTE — Telephone Encounter (Signed)
 Done

## 2024-08-07 NOTE — Telephone Encounter (Signed)
 Please see previous encounter notes have been faxed

## 2024-08-12 ENCOUNTER — Telehealth (HOSPITAL_COMMUNITY): Payer: Self-pay | Admitting: *Deleted

## 2024-08-12 NOTE — Telephone Encounter (Signed)
 Pt called to inform that he is scheduled for ECT at [facility] on 08/14/24 and so has cx his appointment here on 08/15/24, but assures he will reschedule.

## 2024-08-14 NOTE — Telephone Encounter (Signed)
 Thanks for the update

## 2024-08-15 ENCOUNTER — Ambulatory Visit (HOSPITAL_COMMUNITY): Admitting: Psychiatry

## 2024-08-20 ENCOUNTER — Other Ambulatory Visit: Payer: Self-pay | Admitting: Family Medicine

## 2024-09-16 ENCOUNTER — Other Ambulatory Visit: Payer: Self-pay | Admitting: Family Medicine

## 2024-09-20 NOTE — H&P (View-Only) (Signed)
 ECT H & P Update:   The medical history has been reviewed, remains accurate, and is without interval change. The patient's physiologic condition has not changed significantly in the last 30 days. The condition persists for the listed plan, without new options for care. In addition, no new pharmacological allergies or therapy exist that would change the plan or its appropriateness. The patient and/or family understand the potential benefits and risks.

## 2024-09-27 NOTE — Interval H&P Note (Signed)
 H&P reviewed. The patient was examined and there are no changes to the H&P.

## 2024-10-01 ENCOUNTER — Telehealth (HOSPITAL_COMMUNITY): Admitting: Psychiatry

## 2024-10-01 ENCOUNTER — Encounter (HOSPITAL_COMMUNITY): Payer: Self-pay | Admitting: Psychiatry

## 2024-10-01 VITALS — Wt 135.0 lb

## 2024-10-01 DIAGNOSIS — F332 Major depressive disorder, recurrent severe without psychotic features: Secondary | ICD-10-CM

## 2024-10-01 DIAGNOSIS — F4321 Adjustment disorder with depressed mood: Secondary | ICD-10-CM

## 2024-10-01 DIAGNOSIS — F419 Anxiety disorder, unspecified: Secondary | ICD-10-CM | POA: Diagnosis not present

## 2024-10-01 MED ORDER — LORAZEPAM 0.5 MG PO TABS: 0.5000 mg | ORAL_TABLET | Freq: Every day | ORAL | 1 refills | Status: AC | PRN

## 2024-10-01 MED ORDER — ESCITALOPRAM OXALATE 20 MG PO TABS: 30.0000 mg | ORAL_TABLET | Freq: Every day | ORAL | 2 refills | Status: AC

## 2024-10-01 MED ORDER — ARIPIPRAZOLE 5 MG PO TABS: 5.0000 mg | ORAL_TABLET | Freq: Every day | ORAL | 2 refills | Status: AC

## 2024-10-01 NOTE — Progress Notes (Signed)
 [facility] MD Virtual Progress Note   Patient Location: Home Provider Location: Home Office  I connect with patient by video and verified that I am speaking with correct person by using two identifiers. I discussed the limitations of evaluation and management by telemedicine and the availability of in person appointments. I also discussed with the patient that there may be a patient responsible charge related to this service. The patient expressed understanding and agreed to proceed.  [name] [id number] 88 y.o.  10/01/2024 9:48 AM  History of Present Illness:  Patient is evaluated by video session.  His wife is also present during the session.  Patient started ECT in October and so far he has 11 treatment.  He noticed improvement since started the ECT.  He has more energy and he does not feel as tired.  He has 12th treatment tomorrow.  He is not sure how many more treatment.  He reported that things are much better as compared to last visit and he is going outside and spending time watching television and reading poetry.  However 2 weeks ago his younger brother died in [place]  and he noticed since then his mood is not as good.  He feels sad and going through grief.  Though he denies any suicidal thoughts, crying spells or any feeling of hopelessness or worthlessness but thinking about his younger brother.  Patient told mother had health issues.  Patient did go to [place]  to visit the family of his younger brother.  Patient has planned to go to [facility] in March.  He reported wife also reported overall improvement in his mood.  He does not feel as sad, hopeless, depressed.  His energy level is improved.  He Did Not Reduce Abilify  and the 5 mg.  We have reduced to 2.5 because he was complaining of feeling tired and fatigue.  He realized his fatigue was due to depressive symptoms and after ECT he is getting better.  His appetite is okay and he is eating fine.  He sleeps better.  He  has no rash, itching tremors.  I do not see any side effects from ECT other than some time forgetful but not causing any issues in his daily life.  Before the ECT he has medical clearance and blood work which was normal.  His vitamin D  level was low.  He cut down his Ativan  and since started the ECT he only have taken 1 pill because anxiety is also much better.  He denies any major panic attack.  He had a good support from his wife.  Past Psychiatric History: H/O depression and anxiety. Tried Celexa , Pristiq , Remeron , Cymbalta , and Xanax  but limited outcome. Klonopin  caused black stool and Buspar  made groggy.  Had TMS but not helpful.  No h/o suicidal attempt, mania, psychosis or self abusive behavior.  Saw Dr. Gutterman in the past for therapy.  He had genetic testing and the results are in the chart.  Abilify , Vraylar , Trintellix , Wellbutrin  were not effective.   Past Medical History:  Diagnosis Date   Anxiety    BPH (benign prostatic hypertrophy)    DDD (degenerative disc disease), lumbar 2002   Depression    Difficult intubation 02/22/2019   Anterior with limited oral opening   Essential hypertension 03/10/2019   GERD (gastroesophageal reflux disease)    History of colon polyps    History of hiatal hernia 2012   Hyperlipemia    Mild   Indwelling Foley catheter present  Iron deficiency anemia    IV iron therapy    Mild hyperlipidemia 02/20/2008   Qualifier: Diagnosis of  By: Tammie MD, Bruce     Pre-diabetes    Renal cyst 2008   7.6 cm lower pole right renal cyst   Sleep apnea    slight, no CPAP used    Outpatient Encounter Medications as of 10/01/2024  Medication Sig   acetaminophen  (TYLENOL ) 500 MG tablet Take 1,000 mg by mouth every 6 (six) hours as needed for moderate pain or headache.   amLODipine  (NORVASC ) 5 MG tablet TAKE 1 TABLET(5 MG) BY MOUTH DAILY   ARIPiprazole  (ABILIFY ) 5 MG tablet Take 1 tablet (5 mg total) by mouth daily.   aspirin  EC 81 MG tablet Take 1 tablet  (81 mg total) by mouth daily. Swallow whole.   atorvastatin  (LIPITOR) 10 MG tablet Take 1 tablet (10 mg total) by mouth daily.   clobetasol cream (TEMOVATE) 0.05 % Apply 1 application  topically 2 (two) times daily.   escitalopram  (LEXAPRO ) 20 MG tablet Take 1.5 tablets (30 mg total) by mouth daily.   finasteride  (PROSCAR ) 5 MG tablet Take 5 mg by mouth daily.   hydrocortisone  2.5 % cream Apply topically 2 (two) times daily as needed. (Patient taking differently: Apply 1 application  topically 2 (two) times daily as needed (rash).)   LORazepam  (ATIVAN ) 0.5 MG tablet Take 1 tablet (0.5 mg total) by mouth 3 (three) times daily as needed for anxiety.   omeprazole  (PRILOSEC) 40 MG capsule TAKE 1 CAPSULE BY MOUTH DAILY   valsartan  (DIOVAN ) 160 MG tablet TAKE 1 TABLET(160 MG) BY MOUTH DAILY   No facility-administered encounter medications on file as of 10/01/2024.    Recent Results (from the past 2160 hours)  VITAMIN D  25 Hydroxy (Vit-D Deficiency, Fractures)     Status: Abnormal   Collection Time: 08/02/24  8:10 AM  Result Value Ref Range   VITD 19.48 (L) 30.00 - 100.00 ng/mL  TSH     Status: None   Collection Time: 08/02/24  8:10 AM  Result Value Ref Range   TSH 3.75 0.35 - 5.50 uIU/mL  Hemoglobin A1c     Status: None   Collection Time: 08/02/24  8:10 AM  Result Value Ref Range   Hgb A1c MFr Bld 6.0 4.6 - 6.5 %    Comment: Glycemic Control Guidelines for People with Diabetes:Non Diabetic:  <6%Goal of Therapy: <7%Additional Action Suggested:  >8%   Magnesium      Status: None   Collection Time: 08/02/24  8:10 AM  Result Value Ref Range   Magnesium  1.9 1.5 - 2.5 mg/dL  Lipid panel     Status: None   Collection Time: 08/02/24  8:10 AM  Result Value Ref Range   Cholesterol 135 0 - 200 mg/dL    Comment: ATP III Classification       Desirable:  < 200 mg/dL               Borderline High:  200 - 239 mg/dL          High:  > = 759 mg/dL   Triglycerides 51.9 0.0 - 149.0 mg/dL    Comment: Normal:   <849 mg/dLBorderline High:  150 - 199 mg/dL   HDL 37.09 >60.99 mg/dL   VLDL 9.6 0.0 - 59.9 mg/dL   LDL Cholesterol 63 0 - 99 mg/dL   Total CHOL/HDL Ratio 2     Comment:  Men          Women1/2 Average Risk     3.4          3.3Average Risk          5.0          4.42X Average Risk          9.6          7.13X Average Risk          15.0          11.0                       NonHDL 72.30     Comment: NOTE:  Non-HDL goal should be 30 mg/dL higher than patient's LDL goal (i.e. LDL goal of < 70 mg/dL, would have non-HDL goal of < 100 mg/dL)  CMP     Status: Abnormal   Collection Time: 08/02/24  8:10 AM  Result Value Ref Range   Sodium 136 135 - 145 mEq/L   Potassium 3.6 3.5 - 5.1 mEq/L   Chloride 101 96 - 112 mEq/L   CO2 27 19 - 32 mEq/L   Glucose, Bld 104 (H) 70 - 99 mg/dL   BUN 18 6 - 23 mg/dL   Creatinine, Ser 9.14 0.40 - 1.50 mg/dL   Total Bilirubin 0.5 0.2 - 1.2 mg/dL   Alkaline Phosphatase 52 39 - 117 U/L   AST 14 0 - 37 U/L   ALT 16 0 - 53 U/L   Total Protein 6.9 6.0 - 8.3 g/dL   Albumin  4.0 3.5 - 5.2 g/dL   GFR 21.03 >39.99 mL/min    Comment: Calculated using the CKD-EPI Creatinine Equation (2021)   Calcium  9.1 8.4 - 10.5 mg/dL  CBC with Differential/Platelet     Status: Abnormal   Collection Time: 08/02/24  8:10 AM  Result Value Ref Range   WBC 5.3 4.0 - 10.5 K/uL   RBC 4.69 4.22 - 5.81 Mil/uL   Hemoglobin 12.0 (L) 13.0 - 17.0 g/dL   HCT 63.1 (L) 60.9 - 47.9 %   MCV 78.5 78.0 - 100.0 fl   MCHC 32.6 30.0 - 36.0 g/dL   RDW 84.8 88.4 - 84.4 %   Platelets 238.0 150.0 - 400.0 K/uL   Neutrophils Relative % 54.9 43.0 - 77.0 %   Lymphocytes Relative 29.5 12.0 - 46.0 %   Monocytes Relative 12.0 3.0 - 12.0 %   Eosinophils Relative 3.0 0.0 - 5.0 %   Basophils Relative 0.6 0.0 - 3.0 %   Neutro Abs 2.9 1.4 - 7.7 K/uL   Lymphs Abs 1.6 0.7 - 4.0 K/uL   Monocytes Absolute 0.6 0.1 - 1.0 K/uL   Eosinophils Absolute 0.2 0.0 - 0.7 K/uL   Basophils Absolute 0.0 0.0 - 0.1 K/uL      Psychiatric Specialty Exam: Physical Exam  Review of Systems  Weight 135 lb (61.2 kg).Body mass index is 21.79 kg/m.  General Appearance: Casual  Eye Contact:  Fair  Speech:  Normal Rate  Volume:  Normal  Mood:  Euthymic  Affect:  Congruent  Thought Process:  Descriptions of Associations: Intact  Orientation:  Full (Time, Place, and Person)  Thought Content:  WDL  Suicidal Thoughts:  No  Homicidal Thoughts:  No  Memory:  Immediate;   Good Recent;   Good Remote;   Good  Judgement:  Intact  Insight:  Present  Psychomotor Activity:  Decreased  Concentration:  Concentration:  Good and Attention Span: Good  Recall:  Good  Fund of Knowledge:  Good  Language:  Good  Akathisia:  No  Handed:  Right  AIMS (if indicated):     Assets:  Communication Skills Desire for Improvement Housing Social Support Transportation  ADL's:  Intact  Cognition:  WNL  Sleep:  better       08/02/2024    7:32 AM 03/21/2024    3:14 PM 01/04/2024    1:49 PM 06/23/2023    4:03 PM 12/06/2022    5:04 PM  Depression screen PHQ 2/9  Decreased Interest 3 1 3  0 0  Down, Depressed, Hopeless 3 2 2  0 2  PHQ - 2 Score 6 3 5  0 2  Altered sleeping 1 0 2 2 0  Tired, decreased energy 1 2 2 2  0  Change in appetite 3 0 0 0 2  Feeling bad or failure about yourself  3 0 1 0 0  Trouble concentrating 0 2 1 0 3  Moving slowly or fidgety/restless 1 0 1 0 0  Suicidal thoughts 0 0 0 0 0  PHQ-9 Score 15  7  12  4  7    Difficult doing work/chores Somewhat difficult Not difficult at all Somewhat difficult Somewhat difficult Not difficult at all     Data saved with a previous flowsheet row definition    Assessment/Plan: Major depressive disorder, recurrent, severe without psychotic features (HCC) - Plan: ARIPiprazole  (ABILIFY ) 5 MG tablet, escitalopram  (LEXAPRO ) 20 MG tablet  Anxiety - Plan: ARIPiprazole  (ABILIFY ) 5 MG tablet, escitalopram  (LEXAPRO ) 20 MG tablet, LORazepam  (ATIVAN ) 0.5 MG tablet  Grief  Patient  is 88 year old married man with history of severe depression and anxiety.  Review collateral information from other providers and blood work results.  He is now getting ECT which we have recommended in the past and he admitted good decision as he is feeling better.  His wife also endorses improvement in his mood, depression, energy and sleep.  Discussed loss of younger brother 2 weeks ago which puts him back in grief but he had a good support from his wife.  He is excited as younger son is coming from [place]  and Christmas.  He is also planning to go to [facility] in March.  He cut down his Ativan  and only taking as needed.  Since the ECT he has only took once.  Reviewed blood work results.  Vitamin D  level low but hemoglobin A1c and CMP and CBC is normal.  So far tolerating ECT.  Continue Abilify  5 mg daily and Lexapro  30 mg daily.  We will provide a 30 tablets of Ativan  that he can take as needed.  I also offered grief counseling but patient denied as he had a good support from the family.  Will follow-up in 3 months unless patient requested a sooner appointment.  He is not sure how many more treatments of ECT but he has appointment tomorrow to discuss his treatment plan with his doctor.   Follow Up Instructions:     I discussed the assessment and treatment plan with the patient. The patient was provided an opportunity to ask questions and all were answered. The patient agreed with the plan and demonstrated an understanding of the instructions.   The patient was advised to call back or seek an in-person evaluation if the symptoms worsen or if the condition fails to improve as anticipated.    Collaboration of Care: Other provider involved in patient's care AEB notes are  available in the epic.  Patient/Guardian was advised Release of Information must be obtained prior to any record release in order to collaborate their care with an outside provider. Patient/Guardian was advised if they have not  already done so to contact the registration department to sign all necessary forms in order for us  to release information regarding their care.   Consent: Patient/Guardian gives verbal consent for treatment and assignment of benefits for services provided during this visit. Patient/Guardian expressed understanding and agreed to proceed.     Total encounter time 28 minutes which includes face-to-face time, chart reviewed, care coordination, order entry and documentation during this encounter.   Note: This document was prepared by [employer] voice dictation technology and any errors that results from this process are unintentional.    Leni ONEIDA Client, MD 10/01/2024

## 2024-10-04 ENCOUNTER — Other Ambulatory Visit (HOSPITAL_COMMUNITY): Payer: Self-pay | Admitting: Psychiatry

## 2024-10-04 DIAGNOSIS — F332 Major depressive disorder, recurrent severe without psychotic features: Secondary | ICD-10-CM

## 2024-10-04 DIAGNOSIS — F419 Anxiety disorder, unspecified: Secondary | ICD-10-CM

## 2024-10-09 ENCOUNTER — Other Ambulatory Visit: Payer: Self-pay | Admitting: Family Medicine

## 2024-10-20 ENCOUNTER — Other Ambulatory Visit: Payer: Self-pay | Admitting: Cardiology

## 2024-10-20 DIAGNOSIS — E7800 Pure hypercholesterolemia, unspecified: Secondary | ICD-10-CM

## 2024-10-28 ENCOUNTER — Encounter: Payer: Self-pay | Admitting: Family Medicine

## 2024-10-28 ENCOUNTER — Ambulatory Visit: Admitting: Family Medicine

## 2024-10-28 VITALS — BP 126/60 | HR 72 | Temp 98.3°F | Wt 130.6 lb

## 2024-10-28 DIAGNOSIS — I10 Essential (primary) hypertension: Secondary | ICD-10-CM

## 2024-10-28 DIAGNOSIS — F339 Major depressive disorder, recurrent, unspecified: Secondary | ICD-10-CM

## 2024-10-28 DIAGNOSIS — E559 Vitamin D deficiency, unspecified: Secondary | ICD-10-CM

## 2024-10-28 DIAGNOSIS — R634 Abnormal weight loss: Secondary | ICD-10-CM | POA: Diagnosis not present

## 2024-10-28 DIAGNOSIS — G252 Other specified forms of tremor: Secondary | ICD-10-CM | POA: Diagnosis not present

## 2024-10-28 DIAGNOSIS — R5383 Other fatigue: Secondary | ICD-10-CM

## 2024-10-28 MED ORDER — VITAMIN D (ERGOCALCIFEROL) 1.25 MG (50000 UNIT) PO CAPS: 50000.0000 [IU] | ORAL_CAPSULE | ORAL | 3 refills | Status: AC

## 2024-10-28 NOTE — Progress Notes (Signed)
 "  Established Patient Office Visit  Subjective   Patient ID: [name], male    DOB: 09/02/1936  Age: 88 y.o. MRN: [id number]  Chief Complaint  Patient presents with   Medical Management of Chronic Issues    HPI   [name] is seen today accompanied by wife.  Generally feeling poorly.  He states he had several weeks of fatigue.  Sleeping well.  Exercising 2 days/week.  Has had some modest weight loss since last visit.  Wife thinks his memory is not quite as good it used to be.  Chronic problems include hypertension, history of GERD, recurrent depression, chronic anxiety, history of B12 deficiency, lumbar spinal stenosis, history of prediabetes.  Had multiple labs in October and these were reviewed.  A1c stable at 6.0.  Vitamin D  was low at 19.  Hemoglobin chronically low at 12 but stable.  He never started vitamin D  supplementation.  He has had some tremor mostly upper extremities but somewhat facial especially lower lip.  Possibly worse with fatigue.  No changes in gait.  Seems to be more of an intention tremor.  He had very resistant depression and is followed by psychiatry.  Had recent ECT treatment which has helped somewhat.  He had modest weight loss about 4 pounds since last visit.  Denies any chronic headache, difficulty swallowing, chest pain, chronic cough, shortness of breath, abdominal pain, lymphadenopathy, night sweats, or change in bowel habits.  Blood pressure treated with amlodipine  5 mg daily and Diovan  160 mg daily.  No recent dizziness with standing.  Past Medical History:  Diagnosis Date   Anxiety    BPH (benign prostatic hypertrophy)    DDD (degenerative disc disease), lumbar 2002   Depression    Difficult intubation 02/22/2019   Anterior with limited oral opening   Essential hypertension 03/10/2019   GERD (gastroesophageal reflux disease)    History of colon polyps    History of hiatal hernia 2012   Hyperlipemia    Mild   Indwelling Foley catheter present     Iron deficiency anemia    IV iron therapy    Mild hyperlipidemia 02/20/2008   Qualifier: Diagnosis of  By: Tammie MD, Linnie Delgrande     Pre-diabetes    Renal cyst 2008   7.6 cm lower pole right renal cyst   Sleep apnea    slight, no CPAP used   Past Surgical History:  Procedure Laterality Date   COLONOSCOPY  08/2011   COLONOSCOPY WITH PROPOFOL  N/A 06/09/2023   Procedure: COLONOSCOPY WITH PROPOFOL ;  Surgeon: Rollin Dover, MD;  Location: [facility];  Service: Gastroenterology;  Laterality: N/A;   ESOPHAGOGASTRODUODENOSCOPY (EGD) WITH PROPOFOL  N/A 06/09/2023   Procedure: ESOPHAGOGASTRODUODENOSCOPY (EGD) WITH PROPOFOL ;  Surgeon: Rollin Dover, MD;  Location: [facility];  Service: Gastroenterology;  Laterality: N/A;   NASAL SEPTOPLASTY W/ TURBINOPLASTY  08/03/2012   Procedure: NASAL SEPTOPLASTY WITH TURBINATE REDUCTION;  Surgeon: Ida Loader, MD;  Location[facility];  Service: ENT;  Laterality: Bilateral;   POLYPECTOMY  06/09/2023   Procedure: POLYPECTOMY;  Surgeon: Rollin Dover, MD;  Location: [facility];  Service: Gastroenterology;;   UPPER GASTROINTESTINAL ENDOSCOPY  08/2011   XI ROBOTIC ASSISTED SIMPLE PROSTATECTOMY N/A 02/22/2019   Procedure: XI ROBOTIC ASSISTED SIMPLE PROSTATECTOMY;  Surgeon: Alvaro Hummer, MD;  Location: [facility];  Service: Urology;  Laterality: N/A;    reports that he quit smoking about 57 years ago. His smoking use included cigarettes. He started smoking about 67 years ago. He has a 5 pack-year  smoking history. He has never used smokeless tobacco. He reports current alcohol use of about 1.0 standard drink of alcohol per week. He reports that he does not use drugs. family history includes Heart attack in his father; Hypertension in his brother, brother, daughter, father, and sister. Allergies[1]  Review of Systems  Constitutional:  Positive for malaise/fatigue and weight loss. Negative for chills and fever.  Eyes:  Negative for blurred vision.  Respiratory:  Negative for  cough and shortness of breath.   Cardiovascular:  Negative for chest pain.  Gastrointestinal:  Negative for abdominal pain.  Genitourinary:  Negative for dysuria and hematuria.  Neurological:  Positive for tremors. Negative for dizziness, speech change, focal weakness, seizures, loss of consciousness, weakness and headaches.      Objective:     BP 126/60   Pulse 72   Temp 98.3 F (36.8 C) (Oral)   Wt 130 lb 9.6 oz (59.2 kg)   SpO2 96%   BMI 21.08 kg/m  BP Readings from Last 3 Encounters:  10/28/24 126/60  08/02/24 128/60  01/02/24 120/62   Wt Readings from Last 3 Encounters:  10/28/24 130 lb 9.6 oz (59.2 kg)  08/02/24 135 lb 9.6 oz (61.5 kg)  01/02/24 143 lb 12.8 oz (65.2 kg)      Physical Exam Vitals reviewed.  Constitutional:      General: He is not in acute distress.    Appearance: He is well-developed.  HENT:     Right Ear: External ear normal.     Left Ear: External ear normal.  Eyes:     Pupils: Pupils are equal, round, and reactive to light.  Neck:     Thyroid : No thyromegaly.  Cardiovascular:     Rate and Rhythm: Normal rate and regular rhythm.  Pulmonary:     Effort: Pulmonary effort is normal. No respiratory distress.     Breath sounds: Normal breath sounds. No wheezing or rales.  Musculoskeletal:     Cervical back: Neck supple.     Right lower leg: No edema.     Left lower leg: No edema.  Neurological:     Mental Status: He is alert and oriented to person, place, and time.     Cranial Nerves: No cranial nerve deficit.     Comments: Does have fine tremor right upper extremity greater than left and small amount of tremor involving the face specially lower lip region.  Tremor does not extinguish with intention.  No cogwheel rigidity.  Gait relatively normal.      No results found for any visits on 10/28/24.    The ASCVD Risk score (Arnett DK, et al., 2019) failed to calculate for the following reasons:   The 2019 ASCVD risk score is only valid  for ages 66 to 58   * - Cholesterol units were assumed    Assessment & Plan:   #1 hypertension.  Well-controlled.  Continue amlodipine  and valsartan  hand.  Caution about potential hypotension with weight loss.  No recent orthostatic symptoms.  #2 vitamin D  deficiency.  Recommend start vitamin D  50,000 once weekly and consider repeat vitamin D  level in 3 to 4 months  #3 intention tremor.  Suspect essential tremor.  Does not have any abnormal gait, cogwheel rigidity, or other features to suggest likely Parkinson's.  No loss of facial expression.  At this point, tremor not severe enough to warrant treatment.  He wishes to avoid beta-blockers.  #4 fatigue.  Etiology unclear.  Recent labs are reassuring  #  5 modest weight loss.  Unintentional.  We made suggestions to try to increase healthy calorie supplementation.  Consider protein supplement once daily  #6 recurrent depression.  Recent improvement with ECT.  Continue close follow-up with psychiatry  Wolm Scarlet, MD     [1]  Allergies Allergen Reactions   Dexlansoprazole     Other reaction(s): Unknown   "

## 2024-11-29 ENCOUNTER — Other Ambulatory Visit: Payer: Self-pay | Admitting: Family Medicine

## 2024-12-06 ENCOUNTER — Other Ambulatory Visit: Payer: Self-pay | Admitting: Family Medicine

## 2025-01-02 ENCOUNTER — Ambulatory Visit (HOSPITAL_COMMUNITY): Admitting: Psychiatry

## 2025-01-23 ENCOUNTER — Ambulatory Visit (HOSPITAL_COMMUNITY): Admitting: Psychiatry
